# Patient Record
Sex: Female | Born: 1945 | Race: White | Hispanic: No | Smoking: Former smoker
Health system: Southern US, Community
[De-identification: ages and names within clinical notes are randomized; demographics above are authoritative.]

## PROBLEM LIST (undated history)

## (undated) DIAGNOSIS — E119 Type 2 diabetes mellitus without complications: Secondary | ICD-10-CM

## (undated) DIAGNOSIS — I639 Cerebral infarction, unspecified: Secondary | ICD-10-CM

## (undated) DIAGNOSIS — Z8489 Family history of other specified conditions: Secondary | ICD-10-CM

## (undated) DIAGNOSIS — I10 Essential (primary) hypertension: Secondary | ICD-10-CM

## (undated) DIAGNOSIS — I447 Left bundle-branch block, unspecified: Secondary | ICD-10-CM

## (undated) DIAGNOSIS — M5020 Other cervical disc displacement, unspecified cervical region: Secondary | ICD-10-CM

## (undated) DIAGNOSIS — R42 Dizziness and giddiness: Secondary | ICD-10-CM

## (undated) DIAGNOSIS — Z8659 Personal history of other mental and behavioral disorders: Secondary | ICD-10-CM

## (undated) DIAGNOSIS — I6350 Cerebral infarction due to unspecified occlusion or stenosis of unspecified cerebral artery: Secondary | ICD-10-CM

## (undated) DIAGNOSIS — C50919 Malignant neoplasm of unspecified site of unspecified female breast: Secondary | ICD-10-CM

## (undated) HISTORY — DX: Other cervical disc displacement, unspecified cervical region: M50.20

## (undated) HISTORY — DX: Essential (primary) hypertension: I10

## (undated) HISTORY — PX: BACK SURGERY: SHX140

## (undated) HISTORY — PX: TUBAL LIGATION: SHX77

## (undated) HISTORY — DX: Dizziness and giddiness: R42

## (undated) HISTORY — PX: CATARACT EXTRACTION W/ INTRAOCULAR LENS  IMPLANT, BILATERAL: SHX1307

## (undated) HISTORY — DX: Cerebral infarction due to unspecified occlusion or stenosis of unspecified cerebral artery: I63.50

---

## 1999-01-10 ENCOUNTER — Observation Stay (HOSPITAL_COMMUNITY): Admission: RE | Admit: 1999-01-10 | Discharge: 1999-01-11 | Payer: Self-pay | Admitting: *Deleted

## 1999-04-05 ENCOUNTER — Ambulatory Visit (HOSPITAL_BASED_OUTPATIENT_CLINIC_OR_DEPARTMENT_OTHER): Admission: RE | Admit: 1999-04-05 | Discharge: 1999-04-05 | Payer: Self-pay | Admitting: Plastic Surgery

## 1999-11-02 ENCOUNTER — Other Ambulatory Visit: Admission: RE | Admit: 1999-11-02 | Discharge: 1999-11-02 | Payer: Self-pay | Admitting: Obstetrics and Gynecology

## 2000-01-01 ENCOUNTER — Encounter (INDEPENDENT_AMBULATORY_CARE_PROVIDER_SITE_OTHER): Payer: Self-pay

## 2000-01-01 ENCOUNTER — Inpatient Hospital Stay (HOSPITAL_COMMUNITY): Admission: EM | Admit: 2000-01-01 | Discharge: 2000-01-03 | Payer: Self-pay | Admitting: *Deleted

## 2000-12-04 ENCOUNTER — Other Ambulatory Visit: Admission: RE | Admit: 2000-12-04 | Discharge: 2000-12-04 | Payer: Self-pay | Admitting: Obstetrics and Gynecology

## 2001-01-30 ENCOUNTER — Ambulatory Visit (HOSPITAL_COMMUNITY): Admission: RE | Admit: 2001-01-30 | Discharge: 2001-01-30 | Payer: Self-pay | Admitting: Gastroenterology

## 2001-12-17 ENCOUNTER — Other Ambulatory Visit: Admission: RE | Admit: 2001-12-17 | Discharge: 2001-12-17 | Payer: Self-pay | Admitting: Gynecology

## 2004-03-06 ENCOUNTER — Other Ambulatory Visit: Admission: RE | Admit: 2004-03-06 | Discharge: 2004-03-06 | Payer: Self-pay | Admitting: Gynecology

## 2006-08-19 ENCOUNTER — Other Ambulatory Visit: Admission: RE | Admit: 2006-08-19 | Discharge: 2006-08-19 | Payer: Self-pay | Admitting: Gynecology

## 2007-02-24 ENCOUNTER — Inpatient Hospital Stay (HOSPITAL_COMMUNITY): Admission: EM | Admit: 2007-02-24 | Discharge: 2007-02-27 | Payer: Self-pay | Admitting: Emergency Medicine

## 2007-02-25 ENCOUNTER — Ambulatory Visit: Payer: Self-pay | Admitting: Vascular Surgery

## 2007-02-27 ENCOUNTER — Encounter: Payer: Self-pay | Admitting: Internal Medicine

## 2007-04-09 ENCOUNTER — Ambulatory Visit: Payer: Self-pay | Admitting: Vascular Surgery

## 2007-04-16 ENCOUNTER — Encounter: Payer: Self-pay | Admitting: Family Medicine

## 2007-04-21 ENCOUNTER — Ambulatory Visit (HOSPITAL_COMMUNITY): Admission: RE | Admit: 2007-04-21 | Discharge: 2007-04-21 | Payer: Self-pay | Admitting: Interventional Radiology

## 2007-06-02 ENCOUNTER — Inpatient Hospital Stay (HOSPITAL_COMMUNITY): Admission: EM | Admit: 2007-06-02 | Discharge: 2007-06-04 | Payer: Self-pay | Admitting: Emergency Medicine

## 2007-06-02 ENCOUNTER — Ambulatory Visit: Payer: Self-pay | Admitting: Internal Medicine

## 2011-01-23 NOTE — H&P (Signed)
Anna Hunt, Anna Hunt                ACCOUNT NO.:  [id number]   MEDICAL RECORD NO.:  [id number]          PATIENT TYPE:  INP   LOCATION:  5733                         FACILITY:  [facility]   PHYSICIAN:  Therisa Doyne, MD    DATE OF BIRTH:  1946-08-06   DATE OF ADMISSION:  06/01/2007  DATE OF DISCHARGE:                              HISTORY & PHYSICAL   PRIMARY CARE PHYSICIAN:  Chales Salmon. Abigail Miyamoto, M.D., [facility]   CHIEF COMPLAINT:  Right sided chest pain.   HISTORY OF PRESENT ILLNESS:  A 66 year old white female with past  medical history significant for hypertension, diabetes who presents with  3-4 days of malaise and fevers.  She complains of right sided pleuritic  chest pain associated with cough.  She denies shortness of breath,  abdominal pain, nausea, vomiting, dysuria or lower extremity edema.   PAST MEDICAL HISTORY:  1. CVA in 2008.  2. IBD.  3. Hypertension.  4. Hyperlipidemia.  5. Obesity.   SOCIAL HISTORY:  The patient denies tobacco, alcohol or drugs.  She does  have a remote smoking history.   FAMILY HISTORY:  Positive for myocardial infarction.   MEDICATIONS:  1. Avapro 150 mg daily.  2. Glipizide XL 5 mg daily.  3. Byetta 10 units daily.  4. Aspirin 325 mg daily.  5. Zocor 40 mg daily.  6. Paxil 20 mg daily.  7. Xanax 0.5 mg q.h.s.  8. Plavix 75 mg daily.   ALLERGIES:  NIASPAN CAUSES ANAPHYLAXIS.   REVIEW OF SYSTEMS:  All other systems are reviewed and are negative  except as mentioned above in history of present illness.   PHYSICAL EXAMINATION:  VITAL SIGNS:  Temperature 103.9, blood pressure  148/58, pulse 106, respirations 26, oxygen saturation 96% on room air.  GENERAL:  No acute distress.  Alert and oriented x3.  HEENT:  Normocephalic, atraumatic.  Oropharynx pink and moist without  any lesions.  NECK:  Supple.  No JVD.  CARDIOVASCULAR:  Regular rate and rhythm.  No murmurs, rubs or gallops.  CHEST:  Clear to auscultation  bilaterally with crackles at the right  upper lobe.  ABDOMEN:  Positive bowel sounds, soft, nontender, nondistended.  EXTREMITIES:  No cyanosis, clubbing or edema.  NEUROLOGICAL:  Alert and oriented x3.  Cranial nerves II-XII are grossly  intact with no focal deficit.  SKIN:  No rashes.  BACK:  No CVA tenderness.   LABORATORY DATA:  White blood cell 14.8 with a left shift of 88%  segmented neutrophils, 11.9 hemoglobin, platelets 250.  Sodium 133,  potassium 3.8, chloride 99, bicarbonate 25, BUN 10, creatinine 0.9,  glucose 212, total bilirubin 1.6, lipase 20.  Chest x-ray revealed a  right upper lobe pneumonia.  Urinalysis 21-50 white blood cells with  positive nitrites.   ASSESSMENT/PLAN:  A 65 year old white female with past medical history  of diabetes and hypertension who presents with a right upper lobe  pneumonia.   1. For right upper lobe pneumonia, we will treat the patient      empirically with Rocephin and Azithromycin for community acquired  pneumonia.  It should be noted that she was hospitalized      approximately 6 weeks ago, and should she not improve on Rocephin      and Zithromax, then it may be reasonable to broaden her antibiotics      to cover her for possible healthcare associated pneumonia.  Will      check sputum culture as well as urinalysis as she had leukocytes on      her urinalysis.   1. Mild hyponatremia, likely from volume depletion, but could also be      secondary to SIADH from pneumonia, and we will give her normal      saline at 75 cc per hour.   1. Diabetes mellitus.  Hold home medications at this time.  We will      cover her with sliding scale insulin.   1. Hypertension.  Continue Avapro.   1. History of stroke.  Continue aspirin, Plavix and Zocor.   1. Depression.  Continue Paxil.   1. Anxiety.  Continue Xanax.   1. Fluid/electrolytes/nutrition, normal saline at 75 cc per hour,      regular diet, electrolytes are stable.   1.  Prophylaxis for DVT prophylaxis.  Lovenox.      Therisa Doyne, MD  Electronically Signed     SJT/MEDQ  D:  06/02/2007  T:  06/02/2007  Job:  [id number]

## 2011-01-23 NOTE — Consult Note (Signed)
Anna Hunt, Anna Hunt                ACCOUNT NO.:  [id number]   MEDICAL RECORD NO.:  [id number]          PATIENT TYPE:  INP   LOCATION:  1402                         FACILITY:  [facility]   PHYSICIAN:  Casimiro Needle L. Reynolds, M.D.DATE OF BIRTH:  12-23-1945   DATE OF CONSULTATION:  02/26/2007  DATE OF DISCHARGE:                                 CONSULTATION   REQUESTING PHYSICIAN:  Corinna L. Lendell Caprice, MD   REASON FOR EVALUATION:  Stroke.   HISTORY OF PRESENT ILLNESS:  This the initial inpatient consultation  evaluation of this 65 year old woman with a past medical history, which  includes a previous left PCA territory stroke with resultant partial  right visual field loss, for which she says she had seen Dr. Sandria Manly in the  past, for which no old records are available.  The patient reported  onset two evenings ago.  Her for of onset on the evening of June 14, now  June 15, a tingling and numb sensation in the right hand.  She went to  bed, woke up the next morning and notes that the sensation was still  there.  She also noticed that her leg felt a little bit funny, and she  felt a little strange walking around.  She called her primary care  physician who advised her to go to the hospital.  She had a CT of the  head which did not show acute abnormality, but then had an MRI of the  brain which demonstrated an acute left small-vessel pontine infarct.  She was subsequently admitted for further considerations.  She states  that her symptoms have been stable, since she has been here. She  underwent a stroke workup  and the results of that are mostly available  to me at this time.  Neurologic consultation is requested for further  considerations.   PAST MEDICAL HISTORY:  She had a stroke approximately 9 years ago.  She  states that it effected peripheral vision in both eyes, off to her right  side.  She states that because of that, she was placed on Coumadin, and  she has been anticoagulated ever  since.  She denies any history of  atrial fibrillation, valvular heart disease, or any known blood  abnormality.  She has a known history of hypertension, hyperlipidemia,  and diabetes.  She states that these are all under pretty good control.  She has a remote smoking history but does not smoke since her last  stroke.   FAMILY/SOCIAL/REVIEW OF SYSTEMS:  As outlined in admission H&P by Dr.  Rito Ehrlich on February 24, 2007, which is reviewed.   MEDICATIONS:  Prior to admission she was taking Avapro, glipizide,  Byetta, Coumadin, Zocor, Paxil 20 mg daily and Xanax p.r.n.  In the  hospital. she is continued on Xanax q.h.s., Byetta, Glucotrol, sliding  scale insulin, Avapro, Paxil, Zocor and Coumadin.   PHYSICAL EXAMINATION:  VITAL SIGNS:  Temperature 97.4, blood pressure  137/71, pulse 79, respirations 18, O2 sat 96% on room air.  GENERAL:  This is an obese but otherwise healthy-appearing woman, supine  in the hospital bed,  no evident distress.  HEENT:  Head:  Cranium is normocephalic and atraumatic.  Oropharynx  benign.  NECK:  Supple without carotid or supraclavicular bruits.  HEART:  Regular rhythm without murmurs.  NEUROLOGIC:  Mental status.  She is awake and alert.  She is fully  oriented to time, place and person.  Recent and remote memory are  intact.  Attention span, concentration and fund of knowledge are all  appropriate.  Speech is fluent and not dysarthric.  Mood is euthymic and  affect appropriate.  CRANIAL NERVES:  Pupils equal, reactive.  Extraocular movements are full  without nystagmus.  Visual fields full to confrontation, although she  does have a little bit of loss on the far periphery on the right, and  these fields are mostly full to confrontation on the right. Facial  sensation is intact to light touch.  Face, tongue and palate move  normally and symmetrically.  Shoulder shrug strength is normal.  Motor  testing:  Normal bulk and tone.  Normal strength in all tests  of  extremity muscles.  Sensation:  Diminished light touch over the right  hand, otherwise intact throughout.  Coordination:  Rapid movements are  performed well.  Finger-to-nose are performed well.  Gait:  She rises  easily from her chair, and stance is normal.  She is able to ambulate  without much difficulty, has a little bit of difficulty standing with a  narrow base.  Reflexes:  Slightly brisk on the right.  Toes are  downgoing bilaterally.   LABORATORY REVIEW:  CBC on admission:  Normal.  BMETs have been normal.  INRs have been therapeutic in the 2.3-2.4 range.  Lipid profile:  Total  cholesterol 113, triglycerides 201, HDL 24, LDL 49, hemoglobin A1c is  6.9, homocysteine levels normal.  MRI of the brain with MRA:  The  intracranial circulation is personally reviewed.  The MRA is of fairly  poor quality, especially in the posterior circulation.  MRI demonstrates  an acute stroke in the left pontine white matter, in the territory of a  penetrating vessel, with otherwise mild-to-moderate white matter  ischemic changes and encephalomalacia involving an area of the left  occipital lobe.   IMPRESSION:  1. 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  2. History of left PCA territory stroke with incomplete right visual      field loss.  3. Anticoagulation following #2 above.  Indications for this is      uncertain.  4. Multiple vascular risk factors including hypertension,      hyperlipidemia, diabetes and obesity, most of which are in fairly      good control.   RECOMMENDATIONS:  1. For now, would add aspirin 81 mg daily to Coumadin.  However, I      think it will be important to investigate the reason why Coumadin      was started.  It is possible at this drug is not strongly indicated      and could be replaced, perhaps with Aggrenox.  Dr. Abigail Miyamoto and      perhaps Dr. Sandria Manly may have old records, and we will investigate      this.  Would  check an MRA of the neck to further evaluate the      posterior circulation and rule out any proximal vertebral stenosis.      Will continue medications for risk factors.  Given her obesity, it  might be a good idea to check an outpatient polysomnogram to rule      out sleep apnea, which is an independent risk factor for stroke.      She should be prepared for discharge soon.      Michael L. Thad Ranger, M.D.  Electronically Signed     MLR/MEDQ  D:  02/26/2007  T:  02/26/2007  Job:  [id number]   cc:   Chales Salmon. Abigail Miyamoto, M.D.  Fax: [phone]

## 2011-01-23 NOTE — Consult Note (Signed)
NEW PATIENT CONSULTATION   Anna Hunt, Anna Hunt  DOB:  May 11, 1946                                       04/09/2007  ZOXWR#:[id number]   I saw Ms. Sponsel in the office today in consultation concerning  bilateral vertebral artery stenoses.  This is a pleasant 65 year old  right-handed woman who was admitted on February 24, 2007, with an acute  pontine infarct.  She also was noted on her MRI to have an old left  occipital lobe infarct.  The workup during that admission included a  carotid duplex scan which was unremarkable and a 2-Hunt echo which was also  unremarkable.  Just prior to her discharge Dr. Thad Ranger had recommended  an MRA to evaluate the posterior circulation and sure enough she had  evidence of bilateral vertebral disease.  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  She was sent for vascular  consultation.   Of note, she states that her symptoms when she presented with this  stroke in June mostly involved some tingling in her right hand and  heaviness in her right arm in addition to some weakness in the right  leg.  She has had only occasional problems with dizziness but this has  not been a significant problem.  She also upon further questioning  admits to some mild unsteady gait associated with her presenting  symptoms in June.  However this has not been a persistent problem.   She tells me that she did have a previous stroke approximately 9-10  years ago that was associated with loss of peripheral vision and she  tells me she was put on Coumadin at that time.   PAST MEDICAL HISTORY:  Her past medical history is significant for:  1. Non-insulin-dependent diabetes.  2. Hypertension.  3. Hypercholesterolemia.  4. Obesity.  5. She denies any previous history of myocardial infarction, history      of congestive heart failure or history of COPD.   FAMILY HISTORY:   Her mother died with a heart attack at age 83.  She had  a sister who died with a heart attack at age 41.  She thus has a strong  family history of cardiovascular disease.  Her father died in a motor  vehicle accident when she was 65 years old.   SOCIAL HISTORY:  She is single.  She has 2 children.  She quit tobacco  10 years ago.   REVIEW OF SYSTEMS:  Documented on the medical history form in her chart.   MEDICATIONS:  Documented on the medical history form in her chart.  Of  note she is still on Coumadin.   PHYSICAL EXAMINATION:  Vital Signs:  Blood pressure is 122/80 on the  left and 130/82 on the right.  Heart rate is 95.  I do not detect any  coronary disease or supraclavicular bruits.  Lungs:  Clear bilaterally  to auscultation.  Cardiac:  On exam she has a regular rate and rhythm.  Abdomen:  Obese and difficult to assess.  I cannot palpate an aneurysm.  She does have palpable femoral pulses.  Both feet are warm and well-  perfused without ischemic ulcers.  Neurology:  Exam is nonfocal.  She  has good motor and sensory function in both upper  and lower extremities.   I did review her MRA and she does appear to have a proximal right  vertebral artery stenosis.  She has also potentially a moderate proximal  left vertebral artery stenosis.  The carotids look fine on her MRA.  I  also reviewed her carotid duplex scan which showed no evidence of  significant internal carotid artery disease.  Of note, on her duplex  both vertebral arteries were patent with normally directed flow.   IMPRESSION:  Certainly as noted by Dr. Thad Ranger her presenting symptoms  could be related to an embolic phenomenon from her proximal vertebral  disease.  It does not appear that she is having symptoms from low flow  given that she has really had no vertebrobasilar symptoms until she  presented with her stroke.  My thought is that she would benefit from  cerebral arteriography by Dr. Corliss Skains and if the  proximal right  vertebral artery stenosis could be addressed with angioplasty and stent  this could be done at the same time.  However I have recommended that  the patient go ahead and follow up with Dr. Thad Ranger as already  scheduled for April 24, 2007, to be sure he is in agreement with this  plan.  In the meantime she will continue on 81 mg of aspirin daily and  also on her Coumadin.  If it is felt that her proximal vertebral disease  could potentially explain her recent stroke and she is not a candidate  for angioplasty, consideration could be given to vertebral-to-carotid  transposition however, this is associated with multiple potential  complications including stroke, nerve and lymphatic injury.  Therefore I  would favor an endovascular approach if at all possible.  I will be  happy to see her back pending evaluation by  Dr. Thad Ranger and if Dr. Thad Ranger is in agreement with plans for cerebral  arteriogram by Dr. Corliss Skains he could arrange that, or else we would be  happy to arrange that.   Di Kindle. Edilia Bo, M.Hunt.  Electronically Signed   CSD/MEDQ  Hunt:  04/09/2007  T:  04/10/2007  Job:  [id number]   cc:   Casimiro Needle L. Thad Ranger, M.Hunt.  Chales Salmon. Abigail Miyamoto, M.Hunt.

## 2011-01-23 NOTE — H&P (Signed)
Anna Hunt, Anna Hunt                ACCOUNT NO.:  [id number]   MEDICAL RECORD NO.:  [id number]          PATIENT TYPE:  EMS   LOCATION:  ED                           [facility]AC[facility]   PHYSICIAN:  Hollice Espy, M.D.DATE OF BIRTH:  Jan 17, 1946   DATE OF ADMISSION:  02/24/2007  DATE OF DISCHARGE:                              HISTORY & PHYSICAL   PRIMARY CARE PHYSICIAN:  Henrine Screws, M.D.   CHIEF COMPLAINT:  Right arm numbness and weakness.   HISTORY OF PRESENT ILLNESS:  The patient is a 65 year old white female  with a past medical history of a previous cerebrovascular accident that  has affected her peripheral vision.  There is also a history of  hypertension, diabetes, and hyperlipidemia.  About 24 hours ago she was  at home when she started having some numbness in her right hand in the  fingertips.  She decided to monitor her symptoms and then by morning her  symptoms still persisted.  She then also started having some problems  with right arm heaviness and weakness.  She became concerned and called  Dr. Recardo Evangelist office who advised the patient to go right away to the  hospital.  The patient went into the emergency room and the CT scan of  the head was unremarkable, however, except for evidence of her old left  occipital lobe stroke with encephalomalacia, however, an MRI/MRA was  done given the persistent of her symptoms which showed evidence of a new  acute left pontine infarct.  With these findings it was felt best that  the patient come in for further evaluation and treatment.   Currently she is doing okay.  She complains of continued right hand  numbness and right arm weakness and she also noted prior to coming in  that she was having some mild left leg weakness which has affected her  ability to ambulate 100%.  This is also persisting.  She denies any  chest pain, palpitations, shortness of breath, headache, vision changes,  dysphagia, wheezing, coughing, abdominal  pain, hematuria, dysuria,  constipation, or diarrhea.  There are no other focal extremity numbness,  weakness, or pain symptoms other than described above.   REVIEW OF SYSTEMS:  The review of systems is otherwise negative.   PAST MEDICAL HISTORY:  The patient's past medical history includes  previous cerebrovascular accident, hypertension, hyperlipidemia,  diabetes mellitus, and depression.   MEDICATIONS:  She is on Avapro 150 p.o. daily, glipizide 5 p.o. daily,  Byetta 10 p.o. daily, Coumadin 5 p.o. daily, Zocor 40 p.o. daily, Paxil  20 p.o. daily, and Xanax 0.5 p.o. q.h.s.   ALLERGIES:  She has an anaphylactic allergic reaction to Niaspan.   SOCIAL HISTORY:  She denies any tobacco, alcohol, or drug use.  She used  to smoke but quit many many years ago.   FAMILY HISTORY:  The family history is noncontributory.   PHYSICAL EXAMINATION:  VITAL SIGNS:  The patient's vital signs on  admission showed a temperature of 99.5, heart rate of 78, blood pressure  of 150/91, respirations of 20, O2 saturation of 98% on  room air.  Since  being here her blood pressure has come down to 134/82.  GENERAL:  The patient is alert and oriented times three, in on apparent  distress.  HEENT:  Normocephalic, atraumatic.  The mucous membranes are moist.  Her  cranial nerves II through XII are intact.  HEART:  Regular rate and rhythm, S1 and S2, soft.  LUNGS:  The lungs are clear to auscultation bilaterally.  ABDOMEN:  The abdomen is soft, nontender, nondistended, positive bowel  sounds.  EXTREMITIES:  No clubbing, cyanosis, or edema.  She is negative for  Babinski.  Sensation appears to be intact in her lower extremities.  I  did not ambulate the patient.  She seems to have, in terms of her grip,  it actually appears to be intact on the right hand side and only  slightly deficient on the left hand side which is the opposite of what  she is describing.  Other than that she appears to also have some   weakness with shoulder abduction and adduction, on the right hand side,  more consistent with her chief complaint  The rest of her neurological  and musculoskeletal examination workup are essentially unremarkable.   LABORATORY WORK:  CT and MRI are as per HPI.  I am waiting for the final  report on the MRI/MRA.  Sodium of 139, potassium of 3.9, chloride of  103, bicarb of 30, BUN of 11, creatinine of 0.8, and glucose of 94.  White count of 9.1, H and H of 13.8 and 40.3, MCV of 87, platelet count  of 318, no shift.  INR is therapeutic at 2.4.   ASSESSMENT AND PLAN:  1. Acute cerebrovascular accident in a patient on Coumadin with      therapeutic levels.  Certainly this is concerning and will go ahead      and check lifestyle modification factors including homocystine      level, fasting lipid profile, 2-D echocardiogram, and carotid      Dopplers, in hopes to find an avenue for the patient to improve, to      decrease her risk of future stroke.  2. Previous cerebrovascular accident, continue Coumadin, INR is      therapeutic.  3. Diabetes mellitus:  Continue glipizide and Byetta, check hemoglobin      A1C.  4. Hypertension:  Continue Avapro.      Hollice Espy, M.D.  Electronically Signed     SKK/MEDQ  D:  02/24/2007  T:  02/24/2007  Job:  [id number]   cc:   Hollice Espy, M.D.   Chales Salmon. Abigail Miyamoto, M.D.  Fax: [phone]

## 2011-01-23 NOTE — Discharge Summary (Signed)
[name], Anna Hunt                ACCOUNT NO.:  [id number]   MEDICAL RECORD NO.:  [id number]          PATIENT TYPE:  INP   LOCATION:  5730                         FACILITY:  [facility]   PHYSICIAN:  Michiel Cowboy, MDDATE OF BIRTH:  [birth year]   DATE OF ADMISSION:  06/01/2007  DATE OF DISCHARGE:                               DISCHARGE SUMMARY   ADDENDUM:  The patient reports long-standing history of tremors, as well  as occasional burning of her feet.  She is diabetic and also has had a  stroke.  The patient wished this to be worked up further.  Will defer to  Dr. Ria Clock for further evaluation.  Of note, her hemoglobin A1c while  in hospital was 6.8.  The patient may need further neurological workup  and followup, which could be done as an outpatient since this is a  fairly stable problem.      Michiel Cowboy, MD  Electronically Signed     AVD/MEDQ  D:  06/03/2007  T:  06/03/2007  Job:  [id number]

## 2011-01-23 NOTE — Discharge Summary (Signed)
Anna Hunt, Anna Hunt                ACCOUNT NO.:  [id number]   MEDICAL RECORD NO.:  [id number]          PATIENT TYPE:  INP   LOCATION:  5730                         FACILITY:  [facility]   PHYSICIAN:  Michiel Cowboy, MDDATE OF BIRTH:  Feb 18, 1946   DATE OF ADMISSION:  06/01/2007  DATE OF DISCHARGE:  06/04/2007                               DISCHARGE SUMMARY   DISCHARGE DIAGNOSES:  1. Community-acquired pneumonia.  2. Urinary tract infection.  3. History of cerebrovascular accident.  4. Irritable bowel syndrome.  5. Hypertension.  6. Hyponatremia.  7. Hyperlipidemia.  8. Obesity.   HOSPITAL COURSE:  The patient was admitted with right-sided chest pain.   The patient is a 65 year old female with a history of hypertension,  diabetes and stroke.  The patient was complaining of right-sided  pleuritic chest pain, and presented to the emergency department, where  she was found to have right lower lobe pneumonia.  The patient also was  noted to be somewhat hyponatremic down to 125.  The patient was admitted  to the hospital, she had a couple of episodes of fever.  Blood cultures,  urine culture, were drawn.  Blood culture showed negative at the time of  discharge.  Her urine culture showed mixed flora.  But UA showed pyuria.   Patient was first hydrated, then once euvolemic was started on fluid  restriction.  At the day prior to discharge, her sodium is 140.  The  patient's white blood cell count came down to 5.3.  The patient  originally was started on Rocephin and Zithromax, but then switched to  Avelox to complete a 10-day course, which would cover UTI as well.  The  patient was slightly hypotensive, but asymptomatic, and her  hydrochlorothiazide was stopped secondary to her hyponatremia, as well  as her Avapro was decreased to 75 mg a day.  The patient is discharged  with instructions to follow up with her primary care doctor, Dr.  Ria Clock, and have her chemistries rechecked as  well as her blood  pressure at the next appointment in 1 week.   DISCHARGE MEDICATIONS:  1. Avapro 75 mg p.o. once daily.  2. Glipizide XL 5 mg p.o. daily.  3. lantus 10 units subcutaneous daily.  4. Aspirin 81 mg p.o. daily.  5. Zocor 40 mg p.o. daily.  6. Paxil 20 mg p.o. daily.  7. Xanax 0.5 mg p.o. at bedtime.  8. Plavix 75 mg per day.  9. Avelox 400 mg p.o. until June 11, 2007.  10.Stop hydrochlorothiazide.   The patient was instructed to return should she need medical attention,  develop severe shortness of breath or weakness, unsteadiness on her  feet.      Michiel Cowboy, MD  Electronically Signed     AVD/MEDQ  D:  06/03/2007  T:  06/03/2007  Job:  [id number]

## 2011-01-23 NOTE — Discharge Summary (Signed)
Anna Hunt, Anna Hunt                ACCOUNT NO.:  [id number]   MEDICAL RECORD NO.:  [id number]          PATIENT TYPE:  OUT   LOCATION:  MRI                          FACILITY:  [facility]   PHYSICIAN:  Barnetta Chapel, MDDATE OF BIRTH:  09-14-1945   DATE OF ADMISSION:  02/24/2007  DATE OF DISCHARGE:  02/27/2007                               DISCHARGE SUMMARY   PRIMARY CARE PHYSICIAN:  Dr. Henrine Screws   DISCHARGE DIAGNOSES:  1. Acute left pontine infarct.  2. Diabetes mellitus.  3. Hypertension, well controlled.  4. Dyslipidemia.  5. Obesity.   DISCHARGE MEDICATIONS:  1. Avapro 150 mg p.o. once daily.  2. Glipizide XL 5 mg p.o. once daily.  3. Subcutaneous Byetta 10 units b.i.d.  4. Coumadin 5 mg p.o. once daily.  5. Zocor 40 mg p.o. once daily.  6. Paxil 20 mg p.o. once daily.  7. Xanax 0.5 mg q.h.s. p.r.n.  8. Aspirin 81 mg po once daily.   CONSULTATIONS DONE:  The patient was seen by the neurologist, Dr.  Kelli Hope.   IMAGING:  Studies done include:  1. MRI of the brain that showed acute nonhemorrhagic left pontine      infarct.  It also showed old small-to-moderate sized left occipital      lobe infarct with encephalomalacia.  Also showed small vessel      disease-type changes.  2. MRA of the head showedfetal type origin of the left posterior      cerebral artery.  3. CAT scan of the brain showed no acute intracranial abnormalities.      It also showed the old left occipital lobe stroke with      encephalomalacia.   BRIEF HISTORY AND HOSPITAL COURSE:  Please refer to the H&P done on February 24, 2007.  The patient is a 65 year old female with past medical history  significant for CVA, hypertension, hyperlipidemia, diabetes mellitus,  and depression.  The patient presented with numbness, heaviness of the  right upper extremity, as well as heaviness of the right lower  extremity.  The patient called up her primary care physician and was  advised to come to the  hospital.  On presentation to the hospital, MRI  of the brain done revealed acute left pontine infarct.   The patient was admitted to the regular medical floor.  Neurology  consult was called.  It was not readily obvious why the patient had been  on Coumadin.  The patient's symptoms improved without any significant  change in her medications.  A carotid ultrasound has not revealed any  significant abnormality.  Echocardiography has not  revealed any  significant abnormality.  MRA of the neck has been  done, but the  results are still pending.  If there is any significant finding, an  addendum will be added to the discharge summary.  A PT/OT consult was  called, and the physical therapist felt there was not any need for  continued PT.  The patient has done well.  Her blood pressure is well  controlled.  The blood sugars are also well controlled.  She will be  discharged home on above medications.   DISCHARGE PLANS:  1. Discharge the patient home today.  2. Follow up with the primary care physician, Dr. Abigail Miyamoto, in a week.  3. Follow up with Dr. Kelli Hope in 2-4 weeks.  4. ADA diet 1800 kilocals.  5. Activity as tolerated.  6. It will be worth finding out why the patient takes Coumadin.   Addendum: MRA of the Neck was reviewed by the neurologist on call, Dr  Sandria Manly. Dr Sandria Manly has not noted any significant abnormality on the MRA of  the neck. Dr Sandria Manly agreed with plans to discharge patient home.      Barnetta Chapel, MD  Electronically Signed     SIO/MEDQ  D:  02/27/2007  T:  02/27/2007  Job:  [id number]   cc:   Chales Salmon. Abigail Miyamoto, M.D.  Michael L. Thad Ranger, M.D.

## 2011-01-26 NOTE — Procedures (Signed)
[place]  Patient:    Anna Hunt, Anna Hunt                         MRN: [id number] Proc. Date: 01/30/01 Attending:  Verlin Grills, M.D. CC:         Chales Salmon. Abigail Miyamoto, M.D.   Procedure Report  PROCEDURE:  Colonoscopy.  REFERRING PHYSICIAN:  Chales Salmon. Abigail Miyamoto, M.D.  PROCEDURE INDICATION:  Ms. Anna Hunt (date of birth 1946-09-04) is a 65 year old female who is due for screening colonoscopy with polypectomy to prevent colon cancer.  She had an episode of hematochezia following a digital rectal examination.  There is no personal or family history of colon cancer.  I discussed with Ms. Rolon the complications associated with colonoscopy and polypectomy, including intestinal bleeding and intestinal perforation.  Ms. Tarpley has signed the operative permit.  ENDOSCOPIST:  Verlin Grills, M.D.  PREMEDICATION:  Fentanyl 100 mcg, Versed 12.5 mg.  ENDOSCOPE:  Olympus pediatric colonoscope.  DESCRIPTION OF PROCEDURE:  After obtaining informed consent, the patient was placed in the left lateral decubitus position.  I administered intravenous fentanyl and intravenous Versed to achieve conscious sedation for the procedure.  The patients blood pressure, oxygen saturation, and cardiac rhythm were monitored throughout the procedure and documented in the medical record.  Anal inspection was normal.  Digital rectal exam was normal.  The Olympus pediatric video colonoscope was introduced into the rectum and easily advanced to the cecum.  Colonic preparation for the exam today was excellent.  Rectum normal.  Sigmoid colon and descending colon normal.  Splenic flexure normal.  Transverse colon normal.  Hepatic flexure normal.  Ascending colon normal.  Cecum and ileocecal valve normal.  ASSESSMENT:  Normal proctocolonoscopy to the cecum.  RECOMMENDATIONS:  Repeat colonoscopy in approximately 10 years. DD:  01/30/01 TD:   01/30/01 Job: [id number]

## 2011-06-21 ENCOUNTER — Ambulatory Visit (HOSPITAL_COMMUNITY)
Admission: RE | Admit: 2011-06-21 | Discharge: 2011-06-21 | Disposition: A | Discharge status: Home / Self Care | Payer: Medicare Other | Source: Ambulatory Visit | Attending: Gastroenterology | Admitting: Gastroenterology

## 2011-06-21 DIAGNOSIS — I10 Essential (primary) hypertension: Secondary | ICD-10-CM | POA: Insufficient documentation

## 2011-06-21 DIAGNOSIS — H543 Unqualified visual loss, both eyes: Secondary | ICD-10-CM | POA: Insufficient documentation

## 2011-06-21 DIAGNOSIS — R7401 Elevation of levels of liver transaminase levels: Secondary | ICD-10-CM | POA: Insufficient documentation

## 2011-06-21 DIAGNOSIS — R160 Hepatomegaly, not elsewhere classified: Secondary | ICD-10-CM | POA: Insufficient documentation

## 2011-06-21 DIAGNOSIS — R7402 Elevation of levels of lactic acid dehydrogenase (LDH): Secondary | ICD-10-CM | POA: Insufficient documentation

## 2011-06-21 DIAGNOSIS — K7689 Other specified diseases of liver: Secondary | ICD-10-CM | POA: Insufficient documentation

## 2011-06-21 DIAGNOSIS — I6990 Unspecified sequelae of unspecified cerebrovascular disease: Secondary | ICD-10-CM | POA: Insufficient documentation

## 2011-06-21 DIAGNOSIS — Z1211 Encounter for screening for malignant neoplasm of colon: Secondary | ICD-10-CM | POA: Insufficient documentation

## 2011-06-21 DIAGNOSIS — E119 Type 2 diabetes mellitus without complications: Secondary | ICD-10-CM | POA: Insufficient documentation

## 2011-06-21 LAB — CBC
HCT: 30.7 — ABNORMAL LOW
HCT: 32.5 — ABNORMAL LOW
HCT: 35 — ABNORMAL LOW
Hemoglobin: 10.5 — ABNORMAL LOW
Hemoglobin: 11.1 — ABNORMAL LOW
Hemoglobin: 11.9 — ABNORMAL LOW
MCHC: 34.1
MCHC: 34.2
MCHC: 34.3
MCV: 87.8
MCV: 88.9
MCV: 89
Platelets: 204
Platelets: 231
Platelets: 250
RBC: 3.5 — ABNORMAL LOW
RBC: 3.65 — ABNORMAL LOW
RBC: 3.93
RDW: 13
RDW: 13.1
RDW: 13.5
WBC: 14.8 — ABNORMAL HIGH
WBC: 4.2
WBC: 5.3

## 2011-06-21 LAB — BASIC METABOLIC PANEL
BUN: 10
BUN: 10
BUN: 7
BUN: 7
BUN: 8
BUN: 9
CO2: 24
CO2: 24
CO2: 25
CO2: 26
CO2: 26
CO2: 27
Calcium: 8.4
Calcium: 8.6
Calcium: 8.6
Calcium: 8.7
Calcium: 8.8
Calcium: 9.1
Chloride: 100
Chloride: 101
Chloride: 105
Chloride: 108
Chloride: 99
Chloride: 99
Creatinine, Ser: 0.75
Creatinine, Ser: 0.78
Creatinine, Ser: 0.84
Creatinine, Ser: 0.86
Creatinine, Ser: 0.89
Creatinine, Ser: 0.94
GFR calc Af Amer: >60
GFR calc Af Amer: >60
GFR calc Af Amer: >60
GFR calc Af Amer: >60
GFR calc Af Amer: >60
GFR calc Af Amer: >60
GFR calc non Af Amer: >60
GFR calc non Af Amer: >60
GFR calc non Af Amer: >60
GFR calc non Af Amer: >60
GFR calc non Af Amer: >60
GFR calc non Af Amer: >60
Glucose, Bld: 135 — ABNORMAL HIGH
Glucose, Bld: 144 — ABNORMAL HIGH
Glucose, Bld: 158 — ABNORMAL HIGH
Glucose, Bld: 212 — ABNORMAL HIGH
Glucose, Bld: 213 — ABNORMAL HIGH
Glucose, Bld: 229 — ABNORMAL HIGH
Potassium: 3.4 — ABNORMAL LOW
Potassium: 3.5
Potassium: 3.6
Potassium: 3.7
Potassium: 3.8
Potassium: 3.8
Sodium: 131 — ABNORMAL LOW
Sodium: 131 — ABNORMAL LOW
Sodium: 133 — ABNORMAL LOW
Sodium: 135
Sodium: 137
Sodium: 140

## 2011-06-21 LAB — URINALYSIS, ROUTINE W REFLEX MICROSCOPIC
Glucose, UA: NEGATIVE
Ketones, ur: 15 — AB
Nitrite: POSITIVE — AB
Protein, ur: 100 — AB
Specific Gravity, Urine: 1.024
Urobilinogen, UA: 1
pH: 5.5

## 2011-06-21 LAB — CULTURE, BLOOD (ROUTINE X 2)
Culture: NO GROWTH
Culture: NO GROWTH

## 2011-06-21 LAB — URINE CULTURE: Colony Count: 75000

## 2011-06-21 LAB — HEPATIC FUNCTION PANEL
ALT: 33
AST: 35
Albumin: 3.5
Alkaline Phosphatase: 72
Bilirubin, Direct: 0.4 — ABNORMAL HIGH
Indirect Bilirubin: 1.2 — ABNORMAL HIGH
Total Bilirubin: 1.6 — ABNORMAL HIGH
Total Protein: 7.1

## 2011-06-21 LAB — URINE MICROSCOPIC-ADD ON

## 2011-06-21 LAB — DIFFERENTIAL
Basophils Absolute: 0
Basophils Relative: 0
Eosinophils Absolute: 0
Eosinophils Relative: 0
Lymphocytes Relative: 6 — ABNORMAL LOW
Lymphs Abs: 0.9
Monocytes Absolute: 0.8 — ABNORMAL HIGH
Monocytes Relative: 5
Neutro Abs: 13.1 — ABNORMAL HIGH
Neutrophils Relative %: 88 — ABNORMAL HIGH

## 2011-06-21 LAB — GLUCOSE, CAPILLARY: Glucose-Capillary: 176 mg/dL — ABNORMAL HIGH (ref 70–99)

## 2011-06-21 LAB — HEMOGLOBIN A1C
Hgb A1c MFr Bld: 6.8 — ABNORMAL HIGH
Mean Plasma Glucose: 165

## 2011-06-21 LAB — LEGIONELLA PNEUMOPHILA ANTIBODIES: Legionella pneumo, IgG Type 1-6: <1:128 {titer}

## 2011-06-21 LAB — LIPASE, BLOOD: Lipase: 20

## 2011-06-25 LAB — PROTIME-INR
INR: 1.1
Prothrombin Time: 13.9

## 2011-06-25 LAB — CBC
HCT: 38.8
Hemoglobin: 13.2
MCHC: 34
MCV: 89.2
Platelets: 319
RBC: 4.35
RDW: 13.3
WBC: 10.5

## 2011-06-25 LAB — BASIC METABOLIC PANEL
BUN: 15
CO2: 26
Calcium: 10.3
Chloride: 102
Creatinine, Ser: 0.84
GFR calc Af Amer: >60
GFR calc non Af Amer: >60
Glucose, Bld: 199 — ABNORMAL HIGH
Potassium: 3.6
Sodium: 138

## 2011-06-25 LAB — APTT: aPTT: 24

## 2011-06-27 LAB — CBC
HCT: 40.3
Hemoglobin: 13.8
MCHC: 34.2
MCV: 87.2
Platelets: 318
RBC: 4.62
RDW: 13.5
WBC: 9.1

## 2011-06-27 LAB — LIPID PANEL
Cholesterol: 113
HDL: 24 — ABNORMAL LOW
LDL Cholesterol: 49
Total CHOL/HDL Ratio: 4.7
Triglycerides: 201 — ABNORMAL HIGH
VLDL: 40

## 2011-06-27 LAB — DIFFERENTIAL
Basophils Absolute: 0.1
Basophils Relative: 1
Eosinophils Absolute: 0.1
Eosinophils Relative: 1
Lymphocytes Relative: 22
Lymphs Abs: 2
Monocytes Absolute: 0.6
Monocytes Relative: 6
Neutro Abs: 6.4
Neutrophils Relative %: 70

## 2011-06-27 LAB — BASIC METABOLIC PANEL
BUN: 10
BUN: 11
CO2: 26
CO2: 30
Calcium: 10
Calcium: 9.9
Chloride: 103
Chloride: 104
Creatinine, Ser: 0.78
Creatinine, Ser: 0.79
GFR calc Af Amer: >60
GFR calc Af Amer: >60
GFR calc non Af Amer: >60
GFR calc non Af Amer: >60
Glucose, Bld: 155 — ABNORMAL HIGH
Glucose, Bld: 94
Potassium: 3.4 — ABNORMAL LOW
Potassium: 3.9
Sodium: 139
Sodium: 139

## 2011-06-27 LAB — PROTIME-INR
INR: 2.3 — ABNORMAL HIGH
INR: 2.4 — ABNORMAL HIGH
INR: 2.4 — ABNORMAL HIGH
INR: 2.6 — ABNORMAL HIGH
Prothrombin Time: 26.4 — ABNORMAL HIGH
Prothrombin Time: 27.2 — ABNORMAL HIGH
Prothrombin Time: 27.4 — ABNORMAL HIGH
Prothrombin Time: 28.9 — ABNORMAL HIGH

## 2011-06-27 LAB — APTT: aPTT: 36

## 2011-06-27 LAB — HEMOGLOBIN A1C: Hgb A1c MFr Bld: 6.9 — ABNORMAL HIGH

## 2011-06-27 LAB — HOMOCYSTEINE: Homocysteine: 9.6

## 2011-06-27 LAB — SEDIMENTATION RATE: Sed Rate: 12

## 2011-07-02 NOTE — Op Note (Signed)
  NAMEDELPHA, PERKO NO.:  [id number]  MEDICAL RECORD NO.:  [id number]  LOCATION:  WLEN                         FACILITY:  [facility]  PHYSICIAN:  Danise Edge, M.D.   DATE OF BIRTH:  24-Jul-1946  DATE OF PROCEDURE:  06/21/2011 DATE OF DISCHARGE:                              OPERATIVE REPORT   REFERRING PHYSICIAN:  Hessie Diener (Valla Leaver) Tenny Craw, M.D.  HISTORY:  Ms. Anna Hunt is a 65 year old female born on 11-01-1945. The patient is scheduled to undergo her second screening colonoscopy with polypectomy to prevent colon cancer.  In May 2002, the patient underwent a normal screening colonoscopy.  The patient recently underwent her health maintenance gynecologic exam. Abdominal palpation suggested hepatomegaly with the liver edge palpated at 4 cm below the costal margin.  The patient does not consume alcohol. She does take simvastatin and has had a mildly elevated AST since 2010. She also has type 2 diabetes mellitus.  The patient underwent an abdominal ultrasound, which was compatible with hepatic steatosis.  She has not been screened for chronic viral hepatitis, hemochromatosis, autoimmune liver disease, sclerosing cholangitis, alpha 1 antitrypsin deficiency, Wilson disease, and celiac disease.  ENDOSCOPIST:  Danise Edge, M.D.  PREMEDICATION:  Propofol administered by anesthesia.  PROCEDURE IN DETAIL:  Anal inspection and digital rectal exam were normal.  The Pentax pediatric colonoscope was introduced into the rectum and easily advanced to the cecum.  A normal-appearing ileocecal valve and appendiceal orifice were identified.  Colonic preparation for the exam today was good.  Rectum normal.  Retroflex view of the distal rectum normal.  Sigmoid colon and descending colon normal.  Splenic flexure normal.  Transverse colon normal.  Hepatic flexure normal.  Ascending colon normal.  Cecum and ileocecal valve normal.  ASSESSMENT: 1. Normal screening  proctocolonoscopy to the cecum.  Repeat screening     colonoscopy in 10 years. 2. Enlarged liver with mildly elevated liver transaminases and hepatic     steatosis by abdominal ultrasound.  The patient should be screened     for chronic viral hepatitis B and C, hemochromatosis, autoimmune     liver disease, sclerosing cholangitis, alpha 1 antitrypsin     deficiency, Wilson disease, and celiac disease.          ______________________________ Danise Edge, M.D.     MJ/MEDQ  D:  06/21/2011  T:  06/21/2011  Job:  [id number]  cc:   Magnus Sinning) Tenny Craw, M.D. Fax: [phone]  Electronically Signed by Danise Edge M.D. on 07/02/2011 02:05:25 PM

## 2011-11-08 DIAGNOSIS — R7401 Elevation of levels of liver transaminase levels: Secondary | ICD-10-CM | POA: Diagnosis not present

## 2011-11-08 DIAGNOSIS — E119 Type 2 diabetes mellitus without complications: Secondary | ICD-10-CM | POA: Diagnosis not present

## 2011-12-11 DIAGNOSIS — E119 Type 2 diabetes mellitus without complications: Secondary | ICD-10-CM | POA: Diagnosis not present

## 2011-12-11 DIAGNOSIS — H26499 Other secondary cataract, unspecified eye: Secondary | ICD-10-CM | POA: Diagnosis not present

## 2011-12-11 DIAGNOSIS — E11319 Type 2 diabetes mellitus with unspecified diabetic retinopathy without macular edema: Secondary | ICD-10-CM | POA: Diagnosis not present

## 2011-12-11 DIAGNOSIS — H35379 Puckering of macula, unspecified eye: Secondary | ICD-10-CM | POA: Diagnosis not present

## 2012-01-04 DIAGNOSIS — M5020 Other cervical disc displacement, unspecified cervical region: Secondary | ICD-10-CM | POA: Diagnosis not present

## 2012-01-04 DIAGNOSIS — R42 Dizziness and giddiness: Secondary | ICD-10-CM | POA: Diagnosis not present

## 2012-01-04 DIAGNOSIS — I6350 Cerebral infarction due to unspecified occlusion or stenosis of unspecified cerebral artery: Secondary | ICD-10-CM | POA: Diagnosis not present

## 2012-01-16 DIAGNOSIS — I6350 Cerebral infarction due to unspecified occlusion or stenosis of unspecified cerebral artery: Secondary | ICD-10-CM | POA: Diagnosis not present

## 2012-01-16 DIAGNOSIS — R42 Dizziness and giddiness: Secondary | ICD-10-CM | POA: Diagnosis not present

## 2012-02-05 DIAGNOSIS — M5020 Other cervical disc displacement, unspecified cervical region: Secondary | ICD-10-CM | POA: Diagnosis not present

## 2012-02-05 DIAGNOSIS — R42 Dizziness and giddiness: Secondary | ICD-10-CM | POA: Diagnosis not present

## 2012-02-05 DIAGNOSIS — I6350 Cerebral infarction due to unspecified occlusion or stenosis of unspecified cerebral artery: Secondary | ICD-10-CM | POA: Diagnosis not present

## 2012-02-18 DIAGNOSIS — G479 Sleep disorder, unspecified: Secondary | ICD-10-CM | POA: Diagnosis not present

## 2012-02-20 DIAGNOSIS — K7689 Other specified diseases of liver: Secondary | ICD-10-CM | POA: Diagnosis not present

## 2012-03-31 DIAGNOSIS — E119 Type 2 diabetes mellitus without complications: Secondary | ICD-10-CM | POA: Diagnosis not present

## 2012-03-31 DIAGNOSIS — M949 Disorder of cartilage, unspecified: Secondary | ICD-10-CM | POA: Diagnosis not present

## 2012-03-31 DIAGNOSIS — E7800 Pure hypercholesterolemia, unspecified: Secondary | ICD-10-CM | POA: Diagnosis not present

## 2012-03-31 DIAGNOSIS — M899 Disorder of bone, unspecified: Secondary | ICD-10-CM | POA: Diagnosis not present

## 2012-03-31 DIAGNOSIS — I10 Essential (primary) hypertension: Secondary | ICD-10-CM | POA: Diagnosis not present

## 2012-05-07 DIAGNOSIS — E119 Type 2 diabetes mellitus without complications: Secondary | ICD-10-CM | POA: Diagnosis not present

## 2012-05-07 DIAGNOSIS — E7800 Pure hypercholesterolemia, unspecified: Secondary | ICD-10-CM | POA: Diagnosis not present

## 2012-05-07 DIAGNOSIS — G479 Sleep disorder, unspecified: Secondary | ICD-10-CM | POA: Diagnosis not present

## 2012-05-07 DIAGNOSIS — Z79899 Other long term (current) drug therapy: Secondary | ICD-10-CM | POA: Diagnosis not present

## 2012-07-01 DIAGNOSIS — Z23 Encounter for immunization: Secondary | ICD-10-CM | POA: Diagnosis not present

## 2012-07-18 DIAGNOSIS — M5020 Other cervical disc displacement, unspecified cervical region: Secondary | ICD-10-CM | POA: Diagnosis not present

## 2012-07-18 DIAGNOSIS — I6350 Cerebral infarction due to unspecified occlusion or stenosis of unspecified cerebral artery: Secondary | ICD-10-CM | POA: Diagnosis not present

## 2012-07-18 DIAGNOSIS — R42 Dizziness and giddiness: Secondary | ICD-10-CM | POA: Diagnosis not present

## 2012-08-12 DIAGNOSIS — E119 Type 2 diabetes mellitus without complications: Secondary | ICD-10-CM | POA: Diagnosis not present

## 2012-11-07 DIAGNOSIS — L570 Actinic keratosis: Secondary | ICD-10-CM | POA: Diagnosis not present

## 2012-11-07 DIAGNOSIS — E119 Type 2 diabetes mellitus without complications: Secondary | ICD-10-CM | POA: Diagnosis not present

## 2012-12-17 DIAGNOSIS — Z961 Presence of intraocular lens: Secondary | ICD-10-CM | POA: Diagnosis not present

## 2012-12-17 DIAGNOSIS — H26499 Other secondary cataract, unspecified eye: Secondary | ICD-10-CM | POA: Diagnosis not present

## 2012-12-17 DIAGNOSIS — H04129 Dry eye syndrome of unspecified lacrimal gland: Secondary | ICD-10-CM | POA: Diagnosis not present

## 2012-12-17 DIAGNOSIS — E119 Type 2 diabetes mellitus without complications: Secondary | ICD-10-CM | POA: Diagnosis not present

## 2013-01-22 ENCOUNTER — Encounter: Payer: Self-pay | Admitting: Neurology

## 2013-01-22 ENCOUNTER — Ambulatory Visit (INDEPENDENT_AMBULATORY_CARE_PROVIDER_SITE_OTHER): Payer: Medicare Other | Admitting: Neurology

## 2013-01-22 VITALS — BP 129/82 | HR 83 | Ht 63.0 in | Wt 186.0 lb

## 2013-01-22 DIAGNOSIS — I639 Cerebral infarction, unspecified: Secondary | ICD-10-CM

## 2013-01-22 DIAGNOSIS — M5020 Other cervical disc displacement, unspecified cervical region: Secondary | ICD-10-CM

## 2013-01-22 DIAGNOSIS — I6350 Cerebral infarction due to unspecified occlusion or stenosis of unspecified cerebral artery: Secondary | ICD-10-CM

## 2013-01-22 NOTE — Progress Notes (Signed)
HPI:  Anna Hunt, 68 year old white female returns today for followup of stroke.   She has risk factors of diabetes and hypertension   She has a history of a stroke that occurred in 1998 at age 89, with resultant right homonymous hemianopsia  and she was placed on Coumadin for reasons that were never clear. She was then admitted to [facility] with paresthesias and mild weakness on the right side and found to have a pontine infarct by MRI. 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 .   Because no embolic stroke was found Coumadin was discontinued and she was to start aspirin and Plavix. When seen in 2009 by Dr. Thad Ranger she was told to just take aspirin for monotherapy. She is continuing to monitor her risk factors. She gets no regular exercise. Diabetes meds have been changed since last seen. Her most recent hgb A1C was 6.5.    She complains of episodes of dizziness, most recent one was 2 weeks ago, woke up in the morning, she felt a swimmy headed, lasting for 12 hours, there was no spinning, no nausea, she also has episode of mild unsteady gait.  MRA head normal. MRI of the brain with chronic intracranial artherosclerotic disease, and no significant change from 02/24/2007.   Last clinical visit was in November 2013, she is overall doing very well, there was no recurrent dizziness, no gait difficulty, she is on aspirin 81 mg a day,   Physical Exam  General: well developed, well  nourished, seated, in no evident distress Head: head  normocephalic and atraumatic.  Oropharynx benign. Neck: supple no carotid bruits Respiratory: clear to auscultation bilaterally Cardiovascular: regular rate rhythm  Neurologic Exam  Mental Status: pleasant, awake, alert, cooperative to history, talking, and casual conversation. Cranial Nerves: CN II-XII pupils were equal round reactive to light.  Fundi  were sharp bilaterally.  Extraocular movements were full.  Visual fields were full on confrontational test.I did not see visual field deficit on confrontational  test,  Facial sensation and strength were normal.  Hearing was intact to finger rubbing bilaterally.  Uvula tongue were midline.  Head turning and shoulder shrugging were normal and symmetric.  Tongue protrusion into the cheeks strength were normal.  Motor: Normal tone, bulk, and strength. Sensory: Normal to light touch, pinprick, proprioception, and vibratory sensation. Coordination: Normal finger-to-nose, heel-to-shin.  There was no dysmetria noticed. Gait and Station: Narrow based and steady, was able to perform tiptoe, heel, and tandem walking without difficulty.  Romberg sign: Negative Reflexes: Deep tendon reflexes: Biceps: 2/2, Brachioradialis: 2/2, Triceps: 2/2, Pateller: 2/2, Achilles: 2/2.  Plantar responses are flexor.   Assessment and Plan: 67 years old female,with vascular risk factor of hypertension, hyperlipidemia,stroke at a young age. She is overall doing very well   1, continue  aspirin .81mg .  2. RTC as needed.

## 2013-02-04 DIAGNOSIS — L570 Actinic keratosis: Secondary | ICD-10-CM | POA: Diagnosis not present

## 2013-02-04 DIAGNOSIS — D2360 Other benign neoplasm of skin of unspecified upper limb, including shoulder: Secondary | ICD-10-CM | POA: Diagnosis not present

## 2013-02-04 DIAGNOSIS — D485 Neoplasm of uncertain behavior of skin: Secondary | ICD-10-CM | POA: Diagnosis not present

## 2013-02-04 DIAGNOSIS — Z85828 Personal history of other malignant neoplasm of skin: Secondary | ICD-10-CM | POA: Diagnosis not present

## 2013-02-04 DIAGNOSIS — L821 Other seborrheic keratosis: Secondary | ICD-10-CM | POA: Diagnosis not present

## 2013-02-17 DIAGNOSIS — G479 Sleep disorder, unspecified: Secondary | ICD-10-CM | POA: Diagnosis not present

## 2013-02-17 DIAGNOSIS — F411 Generalized anxiety disorder: Secondary | ICD-10-CM | POA: Diagnosis not present

## 2013-02-17 DIAGNOSIS — F429 Obsessive-compulsive disorder, unspecified: Secondary | ICD-10-CM | POA: Diagnosis not present

## 2013-04-30 DIAGNOSIS — Z1231 Encounter for screening mammogram for malignant neoplasm of breast: Secondary | ICD-10-CM | POA: Diagnosis not present

## 2013-04-30 DIAGNOSIS — Z780 Asymptomatic menopausal state: Secondary | ICD-10-CM | POA: Diagnosis not present

## 2013-05-04 DIAGNOSIS — N6489 Other specified disorders of breast: Secondary | ICD-10-CM | POA: Diagnosis not present

## 2013-05-08 DIAGNOSIS — F429 Obsessive-compulsive disorder, unspecified: Secondary | ICD-10-CM | POA: Diagnosis not present

## 2013-05-08 DIAGNOSIS — E119 Type 2 diabetes mellitus without complications: Secondary | ICD-10-CM | POA: Diagnosis not present

## 2013-05-08 DIAGNOSIS — Z0000 Encounter for general adult medical examination without abnormal findings: Secondary | ICD-10-CM | POA: Diagnosis not present

## 2013-05-08 DIAGNOSIS — E7800 Pure hypercholesterolemia, unspecified: Secondary | ICD-10-CM | POA: Diagnosis not present

## 2013-05-08 DIAGNOSIS — Z23 Encounter for immunization: Secondary | ICD-10-CM | POA: Diagnosis not present

## 2013-05-08 DIAGNOSIS — I10 Essential (primary) hypertension: Secondary | ICD-10-CM | POA: Diagnosis not present

## 2013-09-21 DIAGNOSIS — Z85828 Personal history of other malignant neoplasm of skin: Secondary | ICD-10-CM | POA: Diagnosis not present

## 2013-09-21 DIAGNOSIS — L723 Sebaceous cyst: Secondary | ICD-10-CM | POA: Diagnosis not present

## 2013-09-21 DIAGNOSIS — L570 Actinic keratosis: Secondary | ICD-10-CM | POA: Diagnosis not present

## 2013-09-21 DIAGNOSIS — L821 Other seborrheic keratosis: Secondary | ICD-10-CM | POA: Diagnosis not present

## 2013-09-21 DIAGNOSIS — C44319 Basal cell carcinoma of skin of other parts of face: Secondary | ICD-10-CM | POA: Diagnosis not present

## 2013-09-21 DIAGNOSIS — D485 Neoplasm of uncertain behavior of skin: Secondary | ICD-10-CM | POA: Diagnosis not present

## 2013-10-15 DIAGNOSIS — Z85828 Personal history of other malignant neoplasm of skin: Secondary | ICD-10-CM | POA: Diagnosis not present

## 2013-10-15 DIAGNOSIS — C44319 Basal cell carcinoma of skin of other parts of face: Secondary | ICD-10-CM | POA: Diagnosis not present

## 2013-11-09 DIAGNOSIS — M949 Disorder of cartilage, unspecified: Secondary | ICD-10-CM | POA: Diagnosis not present

## 2013-11-09 DIAGNOSIS — E119 Type 2 diabetes mellitus without complications: Secondary | ICD-10-CM | POA: Diagnosis not present

## 2013-11-09 DIAGNOSIS — E7800 Pure hypercholesterolemia, unspecified: Secondary | ICD-10-CM | POA: Diagnosis not present

## 2013-11-09 DIAGNOSIS — M899 Disorder of bone, unspecified: Secondary | ICD-10-CM | POA: Diagnosis not present

## 2013-11-09 DIAGNOSIS — I10 Essential (primary) hypertension: Secondary | ICD-10-CM | POA: Diagnosis not present

## 2013-11-09 DIAGNOSIS — F429 Obsessive-compulsive disorder, unspecified: Secondary | ICD-10-CM | POA: Diagnosis not present

## 2013-12-24 DIAGNOSIS — R109 Unspecified abdominal pain: Secondary | ICD-10-CM | POA: Diagnosis not present

## 2013-12-29 DIAGNOSIS — H47649 Disorders of visual cortex in (due to) vascular disorders, unspecified side of brain: Secondary | ICD-10-CM | POA: Diagnosis not present

## 2013-12-29 DIAGNOSIS — E119 Type 2 diabetes mellitus without complications: Secondary | ICD-10-CM | POA: Diagnosis not present

## 2013-12-29 DIAGNOSIS — Z961 Presence of intraocular lens: Secondary | ICD-10-CM | POA: Diagnosis not present

## 2013-12-29 DIAGNOSIS — H26499 Other secondary cataract, unspecified eye: Secondary | ICD-10-CM | POA: Diagnosis not present

## 2014-02-04 DIAGNOSIS — D239 Other benign neoplasm of skin, unspecified: Secondary | ICD-10-CM | POA: Diagnosis not present

## 2014-02-04 DIAGNOSIS — L723 Sebaceous cyst: Secondary | ICD-10-CM | POA: Diagnosis not present

## 2014-02-04 DIAGNOSIS — D2330 Other benign neoplasm of skin of unspecified part of face: Secondary | ICD-10-CM | POA: Diagnosis not present

## 2014-02-04 DIAGNOSIS — C44711 Basal cell carcinoma of skin of unspecified lower limb, including hip: Secondary | ICD-10-CM | POA: Diagnosis not present

## 2014-02-04 DIAGNOSIS — Z85828 Personal history of other malignant neoplasm of skin: Secondary | ICD-10-CM | POA: Diagnosis not present

## 2014-02-04 DIAGNOSIS — D485 Neoplasm of uncertain behavior of skin: Secondary | ICD-10-CM | POA: Diagnosis not present

## 2014-05-05 DIAGNOSIS — Z803 Family history of malignant neoplasm of breast: Secondary | ICD-10-CM | POA: Diagnosis not present

## 2014-05-05 DIAGNOSIS — Z1231 Encounter for screening mammogram for malignant neoplasm of breast: Secondary | ICD-10-CM | POA: Diagnosis not present

## 2014-05-13 DIAGNOSIS — E119 Type 2 diabetes mellitus without complications: Secondary | ICD-10-CM | POA: Diagnosis not present

## 2014-05-13 DIAGNOSIS — Z23 Encounter for immunization: Secondary | ICD-10-CM | POA: Diagnosis not present

## 2014-05-13 DIAGNOSIS — F429 Obsessive-compulsive disorder, unspecified: Secondary | ICD-10-CM | POA: Diagnosis not present

## 2014-08-16 DIAGNOSIS — L821 Other seborrheic keratosis: Secondary | ICD-10-CM | POA: Diagnosis not present

## 2014-08-16 DIAGNOSIS — D2261 Melanocytic nevi of right upper limb, including shoulder: Secondary | ICD-10-CM | POA: Diagnosis not present

## 2014-08-16 DIAGNOSIS — C44519 Basal cell carcinoma of skin of other part of trunk: Secondary | ICD-10-CM | POA: Diagnosis not present

## 2014-08-16 DIAGNOSIS — D485 Neoplasm of uncertain behavior of skin: Secondary | ICD-10-CM | POA: Diagnosis not present

## 2014-08-16 DIAGNOSIS — Z85828 Personal history of other malignant neoplasm of skin: Secondary | ICD-10-CM | POA: Diagnosis not present

## 2014-11-11 DIAGNOSIS — Z0001 Encounter for general adult medical examination with abnormal findings: Secondary | ICD-10-CM | POA: Diagnosis not present

## 2014-11-11 DIAGNOSIS — I10 Essential (primary) hypertension: Secondary | ICD-10-CM | POA: Diagnosis not present

## 2014-11-11 DIAGNOSIS — I739 Peripheral vascular disease, unspecified: Secondary | ICD-10-CM | POA: Diagnosis not present

## 2014-11-11 DIAGNOSIS — I6509 Occlusion and stenosis of unspecified vertebral artery: Secondary | ICD-10-CM | POA: Diagnosis not present

## 2014-11-11 DIAGNOSIS — Z23 Encounter for immunization: Secondary | ICD-10-CM | POA: Diagnosis not present

## 2014-11-11 DIAGNOSIS — F42 Obsessive-compulsive disorder: Secondary | ICD-10-CM | POA: Diagnosis not present

## 2014-11-11 DIAGNOSIS — E780 Pure hypercholesterolemia: Secondary | ICD-10-CM | POA: Diagnosis not present

## 2014-11-11 DIAGNOSIS — E119 Type 2 diabetes mellitus without complications: Secondary | ICD-10-CM | POA: Diagnosis not present

## 2014-11-11 DIAGNOSIS — Z8673 Personal history of transient ischemic attack (TIA), and cerebral infarction without residual deficits: Secondary | ICD-10-CM | POA: Diagnosis not present

## 2014-11-11 DIAGNOSIS — M8580 Other specified disorders of bone density and structure, unspecified site: Secondary | ICD-10-CM | POA: Diagnosis not present

## 2014-11-12 ENCOUNTER — Other Ambulatory Visit: Payer: Self-pay | Admitting: Family Medicine

## 2014-11-12 DIAGNOSIS — Z8673 Personal history of transient ischemic attack (TIA), and cerebral infarction without residual deficits: Secondary | ICD-10-CM

## 2014-11-18 ENCOUNTER — Ambulatory Visit
Admission: RE | Admit: 2014-11-18 | Discharge: 2014-11-18 | Disposition: A | Discharge status: Home / Self Care | Payer: Medicare Other | Source: Ambulatory Visit | Attending: Family Medicine | Admitting: Family Medicine

## 2014-11-18 DIAGNOSIS — I6523 Occlusion and stenosis of bilateral carotid arteries: Secondary | ICD-10-CM | POA: Diagnosis not present

## 2014-11-18 DIAGNOSIS — Z8673 Personal history of transient ischemic attack (TIA), and cerebral infarction without residual deficits: Secondary | ICD-10-CM

## 2015-02-08 DIAGNOSIS — Z961 Presence of intraocular lens: Secondary | ICD-10-CM | POA: Diagnosis not present

## 2015-02-08 DIAGNOSIS — E119 Type 2 diabetes mellitus without complications: Secondary | ICD-10-CM | POA: Diagnosis not present

## 2015-02-08 DIAGNOSIS — H26491 Other secondary cataract, right eye: Secondary | ICD-10-CM | POA: Diagnosis not present

## 2015-05-17 DIAGNOSIS — Z23 Encounter for immunization: Secondary | ICD-10-CM | POA: Diagnosis not present

## 2015-05-17 DIAGNOSIS — F42 Obsessive-compulsive disorder: Secondary | ICD-10-CM | POA: Diagnosis not present

## 2015-05-17 DIAGNOSIS — E119 Type 2 diabetes mellitus without complications: Secondary | ICD-10-CM | POA: Diagnosis not present

## 2015-05-17 DIAGNOSIS — I10 Essential (primary) hypertension: Secondary | ICD-10-CM | POA: Diagnosis not present

## 2015-05-17 DIAGNOSIS — R748 Abnormal levels of other serum enzymes: Secondary | ICD-10-CM | POA: Diagnosis not present

## 2015-06-27 DIAGNOSIS — M79629 Pain in unspecified upper arm: Secondary | ICD-10-CM | POA: Diagnosis not present

## 2015-11-16 DIAGNOSIS — Z7984 Long term (current) use of oral hypoglycemic drugs: Secondary | ICD-10-CM | POA: Diagnosis not present

## 2015-11-16 DIAGNOSIS — E782 Mixed hyperlipidemia: Secondary | ICD-10-CM | POA: Diagnosis not present

## 2015-11-16 DIAGNOSIS — F429 Obsessive-compulsive disorder, unspecified: Secondary | ICD-10-CM | POA: Diagnosis not present

## 2015-11-16 DIAGNOSIS — Z0000 Encounter for general adult medical examination without abnormal findings: Secondary | ICD-10-CM | POA: Diagnosis not present

## 2015-11-16 DIAGNOSIS — E119 Type 2 diabetes mellitus without complications: Secondary | ICD-10-CM | POA: Diagnosis not present

## 2015-11-16 DIAGNOSIS — I10 Essential (primary) hypertension: Secondary | ICD-10-CM | POA: Diagnosis not present

## 2015-11-16 DIAGNOSIS — I739 Peripheral vascular disease, unspecified: Secondary | ICD-10-CM | POA: Diagnosis not present

## 2016-03-29 DIAGNOSIS — E119 Type 2 diabetes mellitus without complications: Secondary | ICD-10-CM | POA: Diagnosis not present

## 2016-03-29 DIAGNOSIS — I63512 Cerebral infarction due to unspecified occlusion or stenosis of left middle cerebral artery: Secondary | ICD-10-CM | POA: Diagnosis not present

## 2016-03-29 DIAGNOSIS — H26493 Other secondary cataract, bilateral: Secondary | ICD-10-CM | POA: Diagnosis not present

## 2016-03-29 DIAGNOSIS — Z961 Presence of intraocular lens: Secondary | ICD-10-CM | POA: Diagnosis not present

## 2016-05-15 DIAGNOSIS — Z1231 Encounter for screening mammogram for malignant neoplasm of breast: Secondary | ICD-10-CM | POA: Diagnosis not present

## 2016-05-18 DIAGNOSIS — F429 Obsessive-compulsive disorder, unspecified: Secondary | ICD-10-CM | POA: Diagnosis not present

## 2016-05-18 DIAGNOSIS — E119 Type 2 diabetes mellitus without complications: Secondary | ICD-10-CM | POA: Diagnosis not present

## 2016-05-18 DIAGNOSIS — Z7984 Long term (current) use of oral hypoglycemic drugs: Secondary | ICD-10-CM | POA: Diagnosis not present

## 2016-05-18 DIAGNOSIS — Z23 Encounter for immunization: Secondary | ICD-10-CM | POA: Diagnosis not present

## 2016-05-18 DIAGNOSIS — G479 Sleep disorder, unspecified: Secondary | ICD-10-CM | POA: Diagnosis not present

## 2016-07-05 DIAGNOSIS — Z1289 Encounter for screening for malignant neoplasm of other sites: Secondary | ICD-10-CM | POA: Diagnosis not present

## 2016-10-19 DIAGNOSIS — I6930 Unspecified sequelae of cerebral infarction: Secondary | ICD-10-CM | POA: Diagnosis not present

## 2016-10-19 DIAGNOSIS — F429 Obsessive-compulsive disorder, unspecified: Secondary | ICD-10-CM | POA: Diagnosis not present

## 2016-10-19 DIAGNOSIS — I10 Essential (primary) hypertension: Secondary | ICD-10-CM | POA: Diagnosis not present

## 2016-12-03 DIAGNOSIS — F429 Obsessive-compulsive disorder, unspecified: Secondary | ICD-10-CM | POA: Diagnosis not present

## 2016-12-03 DIAGNOSIS — G479 Sleep disorder, unspecified: Secondary | ICD-10-CM | POA: Diagnosis not present

## 2016-12-03 DIAGNOSIS — Z0000 Encounter for general adult medical examination without abnormal findings: Secondary | ICD-10-CM | POA: Diagnosis not present

## 2016-12-03 DIAGNOSIS — I10 Essential (primary) hypertension: Secondary | ICD-10-CM | POA: Diagnosis not present

## 2016-12-03 DIAGNOSIS — Z7984 Long term (current) use of oral hypoglycemic drugs: Secondary | ICD-10-CM | POA: Diagnosis not present

## 2016-12-03 DIAGNOSIS — E782 Mixed hyperlipidemia: Secondary | ICD-10-CM | POA: Diagnosis not present

## 2016-12-03 DIAGNOSIS — I6930 Unspecified sequelae of cerebral infarction: Secondary | ICD-10-CM | POA: Diagnosis not present

## 2016-12-03 DIAGNOSIS — M8580 Other specified disorders of bone density and structure, unspecified site: Secondary | ICD-10-CM | POA: Diagnosis not present

## 2016-12-03 DIAGNOSIS — E119 Type 2 diabetes mellitus without complications: Secondary | ICD-10-CM | POA: Diagnosis not present

## 2017-04-16 DIAGNOSIS — H26491 Other secondary cataract, right eye: Secondary | ICD-10-CM | POA: Diagnosis not present

## 2017-04-16 DIAGNOSIS — Z961 Presence of intraocular lens: Secondary | ICD-10-CM | POA: Diagnosis not present

## 2017-04-16 DIAGNOSIS — E119 Type 2 diabetes mellitus without complications: Secondary | ICD-10-CM | POA: Diagnosis not present

## 2017-04-16 DIAGNOSIS — H04123 Dry eye syndrome of bilateral lacrimal glands: Secondary | ICD-10-CM | POA: Diagnosis not present

## 2017-06-10 DIAGNOSIS — E1165 Type 2 diabetes mellitus with hyperglycemia: Secondary | ICD-10-CM | POA: Diagnosis not present

## 2017-06-10 DIAGNOSIS — Z794 Long term (current) use of insulin: Secondary | ICD-10-CM | POA: Diagnosis not present

## 2017-06-10 DIAGNOSIS — Z23 Encounter for immunization: Secondary | ICD-10-CM | POA: Diagnosis not present

## 2017-12-17 DIAGNOSIS — I739 Peripheral vascular disease, unspecified: Secondary | ICD-10-CM | POA: Diagnosis not present

## 2017-12-17 DIAGNOSIS — I10 Essential (primary) hypertension: Secondary | ICD-10-CM | POA: Diagnosis not present

## 2017-12-17 DIAGNOSIS — G479 Sleep disorder, unspecified: Secondary | ICD-10-CM | POA: Diagnosis not present

## 2017-12-17 DIAGNOSIS — E119 Type 2 diabetes mellitus without complications: Secondary | ICD-10-CM | POA: Diagnosis not present

## 2017-12-17 DIAGNOSIS — F429 Obsessive-compulsive disorder, unspecified: Secondary | ICD-10-CM | POA: Diagnosis not present

## 2017-12-17 DIAGNOSIS — Z0000 Encounter for general adult medical examination without abnormal findings: Secondary | ICD-10-CM | POA: Diagnosis not present

## 2017-12-17 DIAGNOSIS — I6930 Unspecified sequelae of cerebral infarction: Secondary | ICD-10-CM | POA: Diagnosis not present

## 2017-12-17 DIAGNOSIS — E782 Mixed hyperlipidemia: Secondary | ICD-10-CM | POA: Diagnosis not present

## 2017-12-17 DIAGNOSIS — Z794 Long term (current) use of insulin: Secondary | ICD-10-CM | POA: Diagnosis not present

## 2018-04-16 DIAGNOSIS — E119 Type 2 diabetes mellitus without complications: Secondary | ICD-10-CM | POA: Diagnosis not present

## 2018-04-16 DIAGNOSIS — H3581 Retinal edema: Secondary | ICD-10-CM | POA: Diagnosis not present

## 2018-04-16 DIAGNOSIS — Z961 Presence of intraocular lens: Secondary | ICD-10-CM | POA: Diagnosis not present

## 2018-06-19 DIAGNOSIS — I10 Essential (primary) hypertension: Secondary | ICD-10-CM | POA: Diagnosis not present

## 2018-06-19 DIAGNOSIS — E1169 Type 2 diabetes mellitus with other specified complication: Secondary | ICD-10-CM | POA: Diagnosis not present

## 2018-06-19 DIAGNOSIS — F429 Obsessive-compulsive disorder, unspecified: Secondary | ICD-10-CM | POA: Diagnosis not present

## 2018-06-19 DIAGNOSIS — E782 Mixed hyperlipidemia: Secondary | ICD-10-CM | POA: Diagnosis not present

## 2018-06-19 DIAGNOSIS — Z23 Encounter for immunization: Secondary | ICD-10-CM | POA: Diagnosis not present

## 2018-11-16 ENCOUNTER — Observation Stay (HOSPITAL_BASED_OUTPATIENT_CLINIC_OR_DEPARTMENT_OTHER)
Admission: EM | Admit: 2018-11-16 | Discharge: 2018-11-17 | Disposition: A | Discharge status: Home / Self Care | Payer: Medicare Other | Attending: Internal Medicine | Admitting: Internal Medicine

## 2018-11-16 ENCOUNTER — Emergency Department (HOSPITAL_BASED_OUTPATIENT_CLINIC_OR_DEPARTMENT_OTHER): Payer: Medicare Other

## 2018-11-16 ENCOUNTER — Encounter (HOSPITAL_BASED_OUTPATIENT_CLINIC_OR_DEPARTMENT_OTHER): Payer: Self-pay | Admitting: Emergency Medicine

## 2018-11-16 ENCOUNTER — Other Ambulatory Visit: Payer: Self-pay

## 2018-11-16 DIAGNOSIS — E785 Hyperlipidemia, unspecified: Secondary | ICD-10-CM | POA: Diagnosis not present

## 2018-11-16 DIAGNOSIS — M6281 Muscle weakness (generalized): Secondary | ICD-10-CM | POA: Insufficient documentation

## 2018-11-16 DIAGNOSIS — R29810 Facial weakness: Secondary | ICD-10-CM | POA: Diagnosis not present

## 2018-11-16 DIAGNOSIS — E119 Type 2 diabetes mellitus without complications: Secondary | ICD-10-CM | POA: Insufficient documentation

## 2018-11-16 DIAGNOSIS — G459 Transient cerebral ischemic attack, unspecified: Principal | ICD-10-CM | POA: Insufficient documentation

## 2018-11-16 DIAGNOSIS — I447 Left bundle-branch block, unspecified: Secondary | ICD-10-CM | POA: Diagnosis not present

## 2018-11-16 DIAGNOSIS — G9389 Other specified disorders of brain: Secondary | ICD-10-CM | POA: Diagnosis not present

## 2018-11-16 DIAGNOSIS — R41841 Cognitive communication deficit: Secondary | ICD-10-CM | POA: Diagnosis not present

## 2018-11-16 DIAGNOSIS — Z7982 Long term (current) use of aspirin: Secondary | ICD-10-CM | POA: Insufficient documentation

## 2018-11-16 DIAGNOSIS — Z8673 Personal history of transient ischemic attack (TIA), and cerebral infarction without residual deficits: Secondary | ICD-10-CM | POA: Insufficient documentation

## 2018-11-16 DIAGNOSIS — I10 Essential (primary) hypertension: Secondary | ICD-10-CM | POA: Diagnosis present

## 2018-11-16 DIAGNOSIS — R4701 Aphasia: Secondary | ICD-10-CM | POA: Diagnosis not present

## 2018-11-16 DIAGNOSIS — Z79899 Other long term (current) drug therapy: Secondary | ICD-10-CM | POA: Insufficient documentation

## 2018-11-16 DIAGNOSIS — R2681 Unsteadiness on feet: Secondary | ICD-10-CM | POA: Diagnosis not present

## 2018-11-16 DIAGNOSIS — Z8249 Family history of ischemic heart disease and other diseases of the circulatory system: Secondary | ICD-10-CM | POA: Insufficient documentation

## 2018-11-16 HISTORY — DX: Left bundle-branch block, unspecified: I44.7

## 2018-11-16 HISTORY — DX: Cerebral infarction, unspecified: I63.9

## 2018-11-16 HISTORY — DX: Type 2 diabetes mellitus without complications: E11.9

## 2018-11-16 LAB — RAPID URINE DRUG SCREEN, HOSP PERFORMED
Amphetamines: NOT DETECTED
Barbiturates: NOT DETECTED
Benzodiazepines: POSITIVE — AB
Cocaine: NOT DETECTED
Opiates: NOT DETECTED
Tetrahydrocannabinol: NOT DETECTED

## 2018-11-16 LAB — CBC WITH DIFFERENTIAL/PLATELET
Abs Immature Granulocytes: 0.03 10*3/uL (ref 0.00–0.07)
Basophils Absolute: 0 10*3/uL (ref 0.0–0.1)
Basophils Relative: 0 %
Eosinophils Absolute: 0.1 10*3/uL (ref 0.0–0.5)
Eosinophils Relative: 1 %
HCT: 39.4 % (ref 36.0–46.0)
Hemoglobin: 12.8 g/dL (ref 12.0–15.0)
Immature Granulocytes: 0 %
Lymphocytes Relative: 15 %
Lymphs Abs: 1.4 10*3/uL (ref 0.7–4.0)
MCH: 29 pg (ref 26.0–34.0)
MCHC: 32.5 g/dL (ref 30.0–36.0)
MCV: 89.3 fL (ref 80.0–100.0)
Monocytes Absolute: 0.6 10*3/uL (ref 0.1–1.0)
Monocytes Relative: 6 %
Neutro Abs: 7.7 10*3/uL (ref 1.7–7.7)
Neutrophils Relative %: 78 %
Platelets: 250 10*3/uL (ref 150–400)
RBC: 4.41 MIL/uL (ref 3.87–5.11)
RDW: 12.7 % (ref 11.5–15.5)
WBC: 9.9 10*3/uL (ref 4.0–10.5)
nRBC: 0 % (ref 0.0–0.2)

## 2018-11-16 LAB — PROTIME-INR
INR: 0.9 (ref 0.8–1.2)
Prothrombin Time: 12.3 seconds (ref 11.4–15.2)

## 2018-11-16 LAB — COMPREHENSIVE METABOLIC PANEL
ALT: 21 U/L (ref 0–44)
AST: 28 U/L (ref 15–41)
Albumin: 4.3 g/dL (ref 3.5–5.0)
Alkaline Phosphatase: 77 U/L (ref 38–126)
Anion gap: 9 (ref 5–15)
BUN: 13 mg/dL (ref 8–23)
CO2: 20 mmol/L — ABNORMAL LOW (ref 22–32)
Calcium: 9.3 mg/dL (ref 8.9–10.3)
Chloride: 107 mmol/L (ref 98–111)
Creatinine, Ser: 0.65 mg/dL (ref 0.44–1.00)
GFR calc Af Amer: >60 mL/min (ref >60)
GFR calc non Af Amer: >60 mL/min (ref >60)
Glucose, Bld: 174 mg/dL — ABNORMAL HIGH (ref 70–99)
Potassium: 3.8 mmol/L (ref 3.5–5.1)
Sodium: 136 mmol/L (ref 135–145)
Total Bilirubin: 0.8 mg/dL (ref 0.3–1.2)
Total Protein: 7.4 g/dL (ref 6.5–8.1)

## 2018-11-16 LAB — URINALYSIS, ROUTINE W REFLEX MICROSCOPIC
Bilirubin Urine: NEGATIVE
Glucose, UA: NEGATIVE mg/dL
Hgb urine dipstick: NEGATIVE
Ketones, ur: NEGATIVE mg/dL
Nitrite: NEGATIVE
Protein, ur: NEGATIVE mg/dL
Specific Gravity, Urine: <1.005 — ABNORMAL LOW (ref 1.005–1.030)
pH: 6 (ref 5.0–8.0)

## 2018-11-16 LAB — URINALYSIS, MICROSCOPIC (REFLEX): RBC / HPF: NONE SEEN RBC/hpf (ref 0–5)

## 2018-11-16 LAB — CBG MONITORING, ED: Glucose-Capillary: 157 mg/dL — ABNORMAL HIGH (ref 70–99)

## 2018-11-16 LAB — GLUCOSE, CAPILLARY: Glucose-Capillary: 145 mg/dL — ABNORMAL HIGH (ref 70–99)

## 2018-11-16 LAB — ETHANOL: Alcohol, Ethyl (B): <10 mg/dL (ref <10)

## 2018-11-16 LAB — APTT: aPTT: 24 seconds (ref 24–36)

## 2018-11-16 MED ORDER — ACETAMINOPHEN 325 MG PO TABS: 650.0000 mg | ORAL_TABLET | ORAL | Status: DC | PRN

## 2018-11-16 MED ORDER — EXENATIDE 10 MCG/0.04ML ~~LOC~~ SOPN: 10.0000 ug | PEN_INJECTOR | Freq: Two times a day (BID) | SUBCUTANEOUS | Status: DC

## 2018-11-16 MED ORDER — SIMVASTATIN 20 MG PO TABS: 40.0000 mg | ORAL_TABLET | Freq: Every day | ORAL | Status: DC

## 2018-11-16 MED ORDER — INSULIN ASPART 100 UNIT/ML ~~LOC~~ SOLN
0.0000 [IU] | Freq: Three times a day (TID) | SUBCUTANEOUS | Status: DC
  Administered 2018-11-17: 5 [IU] via SUBCUTANEOUS
  Administered 2018-11-17: 2 [IU] via SUBCUTANEOUS

## 2018-11-16 MED ORDER — STROKE: EARLY STAGES OF RECOVERY BOOK
Freq: Once | Status: AC
  Administered 2018-11-17

## 2018-11-16 MED ORDER — ACETAMINOPHEN 650 MG RE SUPP: 650.0000 mg | RECTAL | Status: DC | PRN

## 2018-11-16 MED ORDER — INSULIN ASPART 100 UNIT/ML ~~LOC~~ SOLN: 0.0000 [IU] | Freq: Every day | SUBCUTANEOUS | Status: DC

## 2018-11-16 MED ORDER — IRBESARTAN 75 MG PO TABS: 75.0000 mg | ORAL_TABLET | Freq: Every day | ORAL | Status: DC

## 2018-11-16 MED ORDER — SENNOSIDES-DOCUSATE SODIUM 8.6-50 MG PO TABS: 1.0000 | ORAL_TABLET | Freq: Every evening | ORAL | Status: DC | PRN

## 2018-11-16 MED ORDER — ALPRAZOLAM 0.5 MG PO TABS
0.5000 mg | ORAL_TABLET | Freq: Every evening | ORAL | Status: DC | PRN
  Administered 2018-11-17: 0.5 mg via ORAL
  Filled 2018-11-16: qty 1

## 2018-11-16 MED ORDER — ASPIRIN EC 81 MG PO TBEC
81.0000 mg | DELAYED_RELEASE_TABLET | Freq: Every day | ORAL | Status: DC
  Administered 2018-11-17: 81 mg via ORAL
  Filled 2018-11-16: qty 1

## 2018-11-16 MED ORDER — CLONIDINE HCL 0.1 MG PO TABS: 0.1000 mg | ORAL_TABLET | ORAL | Status: DC | PRN

## 2018-11-16 MED ORDER — ENOXAPARIN SODIUM 40 MG/0.4ML ~~LOC~~ SOLN
40.0000 mg | SUBCUTANEOUS | Status: DC
  Administered 2018-11-17: 40 mg via SUBCUTANEOUS
  Filled 2018-11-16: qty 0.4

## 2018-11-16 MED ORDER — PAROXETINE HCL 20 MG PO TABS
20.0000 mg | ORAL_TABLET | Freq: Every day | ORAL | Status: DC
  Administered 2018-11-17: 20 mg via ORAL
  Filled 2018-11-16: qty 1

## 2018-11-16 MED ORDER — ACETAMINOPHEN 160 MG/5ML PO SOLN: 650.0000 mg | ORAL | Status: DC | PRN

## 2018-11-16 NOTE — ED Notes (Signed)
[facility] arrived to transport pt to [facility] 

## 2018-11-16 NOTE — Assessment & Plan Note (Addendum)
No recent EKG. Prior documentation of EKG in 2013 was NSR.  May need outpatient stress test since this is "new" LBBB.

## 2018-11-16 NOTE — ED Notes (Signed)
Pt ambulated with steady gait to bathroom to provide specimen.

## 2018-11-16 NOTE — ED Notes (Signed)
ED Provider at bedside. Pt on cardiac monitor and auto VS

## 2018-11-16 NOTE — ED Notes (Signed)
Pt to CT

## 2018-11-16 NOTE — ED Notes (Signed)
[facility] notified (Anna Hunt) - patient ready for transport 

## 2018-11-16 NOTE — Plan of Care (Signed)
Discussed with patient and family plan of care for the evening, admission routine and questions with some teach back displayed

## 2018-11-16 NOTE — ED Provider Notes (Signed)
Emergency Department Provider Note   I have reviewed the triage vital signs and the nursing notes.   HISTORY  Chief Complaint Facial Droop   HPI Anna Hunt is a 73 y.o. female with PMH of DM, CVA, and vertigo resents to the emergency department with strokelike symptoms which occurred last night at dinner.  Patient was apparently out eating dinner with her sister when she had to separate episodes of word finding difficulty and speech change.  The family member who witnessed the event states that she was unable to get her words out and noticed some left facial droop.  This occurred twice while at dinner lasting for 5 to 10 minutes each.  Patient felt frustrated but was able to continue on with dinner.  He did not call EMS or present to the emergency department last night.  Patient denies any numbness, weakness, or headache.  She has had 2 prior strokes but nothing in the last several years.  She previously followed with [facility] neurology.  She is not anticoagulated.  Her symptoms resolved and have not returned.  When her other sister found out about what happened at dinner this morning she insisted that she presented to the emergency department for evaluation.   Past Medical History:  Diagnosis Date  . Diabetes mellitus without complication ([place])   . Displacement of cervical intervertebral disc without myelopathy   . LBBB (left bundle branch block)   . Occipital stroke ([place]) - left   . Unspecified cerebral artery occlusion with cerebral infarction   . Vertigo     Patient Active Problem List   Diagnosis Date Noted  . TIA (transient ischemic attack) 11/16/2018  . HTN (hypertension), benign 11/16/2018  . LBBB (left bundle branch block)   . Diabetes mellitus without complication ([place])   . Stroke ([place]) 01/22/2013  . Displacement of cervical intervertebral disc without myelopathy     Past Surgical History:  Procedure Laterality Date  . BACK SURGERY      Allergies Niaspan  [niacin er]  History reviewed. No pertinent family history.  Social History Social History   Tobacco Use  . Smoking status: Never Smoker  . Smokeless tobacco: Never Used  Substance Use Topics  . Alcohol use: No  . Drug use: No    Review of Systems  Constitutional: No fever/chills Eyes: No visual changes. ENT: No sore throat. Cardiovascular: Denies chest pain. Respiratory: Denies shortness of breath. Gastrointestinal: No abdominal pain.  No nausea, no vomiting.  No diarrhea.  No constipation. Genitourinary: Negative for dysuria. Musculoskeletal: Negative for back pain. Skin: Negative for rash. Neurological: Negative for headaches or numbness. Transient speech change and facial droop.   10-point ROS otherwise negative.  ____________________________________________   PHYSICAL EXAM:  VITAL SIGNS: ED Triage Vitals [11/16/18 1240]  Enc Vitals Group     BP (!) 169/81     Pulse Rate 91     Resp 18     Temp 97.8 F (36.6 C)     Temp Source Oral     SpO2 100 %     Weight 186 lb 1.1 oz (84.4 kg)     Height 5' 2.5" (1.588 m)   Constitutional: Alert and oriented. Well appearing and in no acute distress. Eyes: Conjunctivae are normal.  Head: Atraumatic. Nose: No congestion/rhinnorhea. Mouth/Throat: Mucous membranes are moist.   Neck: No stridor.   Cardiovascular: Normal rate, regular rhythm. Good peripheral circulation. Grossly normal heart sounds.   Respiratory: Normal respiratory effort.  No retractions.  Lungs CTAB. Gastrointestinal: Soft and nontender. No distention.  Musculoskeletal: No lower extremity tenderness nor edema. No gross deformities of extremities. Neurologic:  Normal speech and language. No gross focal neurologic deficits are appreciated. Normal CN exam 2-12. Normal finger-to-nose.  Skin:  Skin is warm, dry and intact. No rash noted.  ____________________________________________   LABS (all labs ordered are listed, but only abnormal results are  displayed)  Labs Reviewed  RAPID URINE DRUG SCREEN, HOSP PERFORMED - Abnormal; Notable for the following components:      Result Value   Benzodiazepines POSITIVE (*)    All other components within normal limits  URINALYSIS, ROUTINE W REFLEX MICROSCOPIC - Abnormal; Notable for the following components:   APPearance HAZY (*)    Specific Gravity, Urine <1.005 (*)    Leukocytes,Ua SMALL (*)    All other components within normal limits  COMPREHENSIVE METABOLIC PANEL - Abnormal; Notable for the following components:   CO2 20 (*)    Glucose, Bld 174 (*)    All other components within normal limits  URINALYSIS, MICROSCOPIC (REFLEX) - Abnormal; Notable for the following components:   Bacteria, UA RARE (*)    All other components within normal limits  CBG MONITORING, ED - Abnormal; Notable for the following components:   Glucose-Capillary 157 (*)    All other components within normal limits  ETHANOL  PROTIME-INR  APTT  CBC WITH DIFFERENTIAL/PLATELET   ____________________________________________  EKG   EKG Interpretation  Date/Time:  Sunday November 16 2018 13:02:35 EDT Ventricular Rate:  92 PR Interval:    QRS Duration: 138 QT Interval:  395 QTC Calculation: 489 R Axis:   43 Text Interpretation:  Sinus rhythm Left bundle branch block New LBBB. No STEMI.  Confirmed by Nanda Quinton [phone]) on 11/16/2018 1:19:25 PM       ____________________________________________  RADIOLOGY  Ct Head Wo Contrast  Result Date: 11/16/2018 CLINICAL DATA:  Right-sided facial droop and aphasia last night, resolved this morning. EXAM: CT HEAD WITHOUT CONTRAST TECHNIQUE: Contiguous axial images were obtained from the base of the skull through the vertex without intravenous contrast. COMPARISON:  MR brain and CT head dated February 24, 2007. FINDINGS: Brain: No evidence of acute infarction, hemorrhage, hydrocephalus, extra-axial collection or mass lesion/mass effect. Unchanged left occipital lobe  encephalomalacia. Relatively unchanged mild diffuse cerebral atrophy. Progressive moderate chronic microvascular ischemic changes. Vascular: Calcified atherosclerosis at the skullbase. No hyperdense vessel. Skull: Negative for fracture or focal lesion. Sinuses/Orbits: No acute finding. Other: None. IMPRESSION: 1. No acute intracranial abnormality. Unchanged old left occipital lobe infarct. 2. Progressive moderate chronic microvascular ischemic changes. Electronically Signed   By: Titus Dubin M.D.   On: 11/16/2018 13:36    ____________________________________________   PROCEDURES  Procedure(s) performed:   Procedures  None ____________________________________________   INITIAL IMPRESSION / ASSESSMENT AND PLAN / ED COURSE  Pertinent labs & imaging results that were available during my care of the patient were reviewed by me and considered in my medical decision making (see chart for details).  Patient presents to the emergency department with symptoms concerning for TIA.  She has no focal neurologic deficits on my exam.  Symptoms occurred last night.  Patient is high risk for stroke.  Plan for CT and labs here but will likely require admission for TIA work-up.  Labs and CT reviewed. No acute findings. Old CVA noted. Plan for admit.   Discussed patient's case with Hospitalist to request admission. Patient and family (if present) updated with plan. Care transferred to  Hospitlaist service.  I reviewed all nursing notes, vitals, pertinent old records, EKGs, labs, imaging (as available).  ____________________________________________  FINAL CLINICAL IMPRESSION(S) / ED DIAGNOSES  Final diagnoses:  TIA (transient ischemic attack)     Note:  This document was prepared using Dragon voice recognition software and may include unintentional dictation errors.  Nanda Quinton, MD Emergency Medicine    Long, Wonda [facility], MD 11/16/18 Karl Bales

## 2018-11-16 NOTE — ED Notes (Signed)
Report given to Rachel, RN.

## 2018-11-16 NOTE — Assessment & Plan Note (Signed)
Hold metformin and Byetta.  Place on SSI protocol. Check HgA1C.

## 2018-11-16 NOTE — Assessment & Plan Note (Signed)
Continue Benicar or hospital-equivalent. Prn clonidine for HTN

## 2018-11-16 NOTE — Progress Notes (Addendum)
[facility] text paged of patient's arrival to [id number] via [facility].  [facility] admissions called back, said Dr. Alcario Drought would be admitting this pt and is aware of their arrival.

## 2018-11-16 NOTE — H&P (Addendum)
History and Physical    Anna Hunt EXB:[id number] DOB: [birth year] DOA: 11/16/2018  PCP: Lawerance Cruel, MD  Patient coming from: Home  I have personally briefly reviewed patient's old medical records in [place]  Chief Complaint: Transient aphasia  HPI: Anna Hunt is a 73 y.o. female with medical history significant of DM2, HTN, prior L occipital stroke.  Patient presents to the ED at [facility] with c/o Transient aphasia that occurred at dinner the night before.  The evening of 3/7, she was at dinner when she apparently had an episode of word finding difficulty.  This witnessed by sister.  Lasted 5-10 mins per each of 2 episodes.  Sister also noted R sided facial droop during the episodes.  Didn't call EMS and has been symptom free since that time and throughout the day today.  Her other sister found out about what happened this morning, and convinced the patient to present to the ED.   ED Course: Patient remained asymptomatic in ED.  CT head neg.   Review of Systems: As per HPI otherwise 10 point review of systems negative.   Past Medical History:  Diagnosis Date  . Diabetes mellitus without complication ([place])   . Displacement of cervical intervertebral disc without myelopathy   . LBBB (left bundle branch block)   . Occipital stroke ([place]) - left   . Unspecified cerebral artery occlusion with cerebral infarction   . Vertigo     Past Surgical History:  Procedure Laterality Date  . BACK SURGERY       reports that she has never smoked. She has never used smokeless tobacco. She reports that she does not drink alcohol or use drugs.  Allergies  Allergen Reactions  . Niaspan [Niacin Er]     Family History  Problem Relation Age of Onset  . Heart attack Sister 33  Fairly extensive FHx of multiple members with CAD including early onset CAD.  Prior to Admission medications   Medication Sig Start Date End Date Taking? Authorizing Provider  ALPRAZolam Duanne Moron)  0.5 MG tablet 0.5 mg daily. 12/11/12   [provider]  aspirin 81 MG tablet Take 81 mg by mouth daily.    [provider]  BD PEN NEEDLE NANO U/F 32G X 4 MM MISC  12/26/12   [provider]  BENICAR 20 MG tablet 20 mg daily. 01/07/13   [provider]  BYETTA 10 MCG PEN 10 MCG/0.04ML SOPN 10 mcg as directed. 12/26/12   [provider]  Cholecalciferol (VITAMIN D) 2000 UNITS CAPS Take by mouth daily.    [provider]  metFORMIN (GLUCOPHAGE-XR) 500 MG 24 hr tablet 500 mg daily. 01/05/13   [provider]  Multiple Vitamins-Minerals (MULTIVITAMIN PO) Take by mouth daily.    [provider]  PARoxetine (PAXIL) 20 MG tablet 20 mg daily. 01/05/13   [provider]  simvastatin (ZOCOR) 40 MG tablet 40 mg daily. 01/05/13   [provider]    Physical Exam: Vitals:   11/16/18 1645 11/16/18 1829 11/16/18 1841 11/16/18 2143  BP: 132/81 135/74 (!) 154/84 (!) 157/75  Pulse: 84 82 80 84  Resp: 16 18  18   Temp:    98.9 F (37.2 C)  TempSrc:    Oral  SpO2: 95% 96% 95% 96%  Weight:    74.5 kg  Height:    5' 2.5" (1.588 m)    Constitutional: NAD, calm, comfortable Eyes: PERRL, lids and conjunctivae normal ENMT: Mucous membranes  are moist. Posterior pharynx clear of any exudate or lesions.Normal dentition.  Neck: normal, supple, no masses, no thyromegaly Respiratory: clear to auscultation bilaterally, no wheezing, no crackles. Normal respiratory effort. No accessory muscle use.  Cardiovascular: Regular rate and rhythm, no murmurs / rubs / gallops. No extremity edema. 2+ pedal pulses. No carotid bruits.  Abdomen: no tenderness, no masses palpated. No hepatosplenomegaly. Bowel sounds positive.  Musculoskeletal: no clubbing / cyanosis. No joint deformity upper and lower extremities. Good ROM, no contractures. Normal muscle tone.  Skin: no rashes, lesions, ulcers. No induration Neurologic: CN 2-12 grossly intact.  Sensation intact, DTR normal. Strength 5/5 in all 4.  Psychiatric: Normal judgment and insight. Alert and oriented x 3. Normal mood.    Labs on Admission: I have personally reviewed following labs and imaging studies  CBC: Recent Labs  Lab 11/16/18 1256  WBC 9.9  NEUTROABS 7.7  HGB 12.8  HCT 39.4  MCV 89.3  PLT 161   Basic Metabolic Panel: Recent Labs  Lab 11/16/18 1256  NA 136  K 3.8  CL 107  CO2 20*  GLUCOSE 174*  BUN 13  CREATININE 0.65  CALCIUM 9.3   GFR: Estimated Creatinine Clearance: 60.8 mL/min (by C-G formula based on SCr of 0.65 mg/dL). Liver Function Tests: Recent Labs  Lab 11/16/18 1256  AST 28  ALT 21  ALKPHOS 77  BILITOT 0.8  PROT 7.4  ALBUMIN 4.3   No results for input(s): LIPASE, AMYLASE in the last 168 hours. No results for input(s): AMMONIA in the last 168 hours. Coagulation Profile: Recent Labs  Lab 11/16/18 1256  INR 0.9   Cardiac Enzymes: No results for input(s): CKTOTAL, CKMB, CKMBINDEX, TROPONINI in the last 168 hours. BNP (last 3 results) No results for input(s): PROBNP in the last 8760 hours. HbA1C: No results for input(s): HGBA1C in the last 72 hours. CBG: Recent Labs  Lab 11/16/18 1243 11/16/18 2146  GLUCAP 157* 145*   Lipid Profile: No results for input(s): CHOL, HDL, LDLCALC, TRIG, CHOLHDL, LDLDIRECT in the last 72 hours. Thyroid Function Tests: No results for input(s): TSH, T4TOTAL, FREET4, T3FREE, THYROIDAB in the last 72 hours. Anemia Panel: No results for input(s): VITAMINB12, FOLATE, FERRITIN, TIBC, IRON, RETICCTPCT in the last 72 hours. Urine analysis:    Component Value Date/Time   COLORURINE YELLOW 11/16/2018 1526   APPEARANCEUR HAZY (A) 11/16/2018 1526   LABSPEC <1.005 (L) 11/16/2018 1526   PHURINE 6.0 11/16/2018 1526   GLUCOSEU NEGATIVE 11/16/2018 1526   HGBUR NEGATIVE 11/16/2018 1526   BILIRUBINUR NEGATIVE 11/16/2018 1526   KETONESUR NEGATIVE 11/16/2018 1526   PROTEINUR NEGATIVE 11/16/2018 1526    UROBILINOGEN 1.0 06/01/2007 1926   NITRITE NEGATIVE 11/16/2018 1526   LEUKOCYTESUR SMALL (A) 11/16/2018 1526    Radiological Exams on Admission: Ct Head Wo Contrast  Result Date: 11/16/2018 CLINICAL DATA:  Right-sided facial droop and aphasia last night, resolved this morning. EXAM: CT HEAD WITHOUT CONTRAST TECHNIQUE: Contiguous axial images were obtained from the base of the skull through the vertex without intravenous contrast. COMPARISON:  MR brain and CT head dated February 24, 2007. FINDINGS: Brain: No evidence of acute infarction, hemorrhage, hydrocephalus, extra-axial collection or mass lesion/mass effect. Unchanged left occipital lobe encephalomalacia. Relatively unchanged mild diffuse cerebral atrophy. Progressive moderate chronic microvascular ischemic changes. Vascular: Calcified atherosclerosis at the skullbase. No hyperdense vessel. Skull: Negative for fracture or focal lesion. Sinuses/Orbits: No acute finding. Other: None. IMPRESSION: 1. No acute intracranial abnormality. Unchanged old left occipital lobe infarct. 2. Progressive  moderate chronic microvascular ischemic changes. Electronically Signed   By: Titus Dubin M.D.   On: 11/16/2018 13:36    EKG: Independently reviewed.  Assessment/Plan Principal Problem:   TIA (transient ischemic attack) Active Problems:   LBBB (left bundle branch block)   Diabetes mellitus without complication (HCC)   HTN (hypertension), benign    1. TIA - 1. TIA pathway and work up 2. MRI/MRA 3. Cont ASA 81 for now, continue her max dose statin 4. Call neuro for consult if MRI positive, otherwise may be able to f/u outpt if this and remainder of WU negative 5. Carotid duplex 6. 2d echo 7. Tele monitor 8. A1C, FLP 2. DM2 - 1. Hold metformin 2. Continue Byetta 3. Mod scale SSI AC/HS 3. HTN - 1. If MRI is negative, then resume Benicar in AM (once we can verify dose). 4. LBBB - 1. Apparently new since we last had EKG 2. Send for PCP records to  see if they have any records or EKG showing this 3. 2d echo being obtained anyhow 4. Asymptomatic from cardiac standpoint, and no h/o CP, SOB, etc. so dont think theres an acute cardiac event at this point. 5. However, depending on above results, may or may not need to involve cards.  DVT prophylaxis: Lovenox Code Status: Partial: everything but intubation Family Communication: Family at bedside Disposition Plan: Home after admit Consults called: None Admission status: Place in 36     Hawke Villalpando, [place] Hospitalists  How to contact the [facility] Attending or Consulting provider Doctor Phillips or covering provider during after hours [place], for this patient?  1. Check the care team in [facility] and look for a) attending/consulting TR[facility] provider listed and b) the [facility] team listed 2. Log into [email or web address]  Amion Physician Scheduling and messaging for groups and whole hospitals  On call and physician scheduling software for group practices, residents, hospitalists and other medical providers for call, clinic, rotation and shift schedules. OnCall [employer] is a hospital-wide system for scheduling doctors and paging doctors on call. EasyPlot is for scientific plotting and data analysis.  [email or web address]  and use [place]'s universal password to access. If you do not have the password, please contact the hospital operator.  3. Locate the [facility] provider you are looking for under [facility] and page to a number that you can be directly reached. 4. If you still have difficulty reaching the provider, please page the [facility] (Director on Call) for the Hospitalists listed on amion for assistance.  11/16/2018, 10:20 PM

## 2018-11-16 NOTE — Assessment & Plan Note (Signed)
Admit to observation telemetry bed. Check carotid U/S, MRI/MRA brain. Check bubble echo.  Continue ASA 81 mg daily. Pt with LBBB.  Check lipid panel. Continue Zocor. Start lovenox 40 mg SQ daily for DVT prophylaxis.  Likely DC to home tomorrow if workup is negative.

## 2018-11-16 NOTE — ED Triage Notes (Signed)
Patient states that last night at dinner unknown time that she had some aphagia and confusion at dinner. The patient states that her daughter was with her and the patient had a facial droop. The patient reports that all of these symptoms have resolved at this time

## 2018-11-16 NOTE — Progress Notes (Signed)
Admissions screening questions done, pt verbalized w/ Tracie RN witnessing that she wants her code status to be do not intubate, but okay for all other measures. Per pt "do not want to be hooked up to a bunch of machines".

## 2018-11-17 ENCOUNTER — Other Ambulatory Visit (HOSPITAL_COMMUNITY): Payer: Medicare Other

## 2018-11-17 ENCOUNTER — Observation Stay (HOSPITAL_COMMUNITY): Payer: Medicare Other

## 2018-11-17 ENCOUNTER — Observation Stay (HOSPITAL_BASED_OUTPATIENT_CLINIC_OR_DEPARTMENT_OTHER): Payer: Medicare Other

## 2018-11-17 ENCOUNTER — Other Ambulatory Visit: Payer: Self-pay | Admitting: Cardiology

## 2018-11-17 DIAGNOSIS — G459 Transient cerebral ischemic attack, unspecified: Secondary | ICD-10-CM

## 2018-11-17 DIAGNOSIS — R0602 Shortness of breath: Secondary | ICD-10-CM

## 2018-11-17 DIAGNOSIS — R4701 Aphasia: Secondary | ICD-10-CM | POA: Diagnosis not present

## 2018-11-17 DIAGNOSIS — R29810 Facial weakness: Secondary | ICD-10-CM | POA: Diagnosis not present

## 2018-11-17 LAB — LIPID PANEL
Cholesterol: 120 mg/dL (ref 0–200)
HDL: 34 mg/dL — ABNORMAL LOW (ref >40)
LDL Cholesterol: 41 mg/dL (ref 0–99)
Total CHOL/HDL Ratio: 3.5 RATIO
Triglycerides: 226 mg/dL — ABNORMAL HIGH (ref <150)
VLDL: 45 mg/dL — ABNORMAL HIGH (ref 0–40)

## 2018-11-17 LAB — ECHOCARDIOGRAM COMPLETE
Height: 62.5 in
Weight: 2627.2 oz

## 2018-11-17 LAB — HEMOGLOBIN A1C
Hgb A1c MFr Bld: 6.2 % — ABNORMAL HIGH (ref 4.8–5.6)
Mean Plasma Glucose: 131.24 mg/dL

## 2018-11-17 LAB — GLUCOSE, CAPILLARY
Glucose-Capillary: 238 mg/dL — ABNORMAL HIGH (ref 70–99)
Glucose-Capillary: 145 mg/dL — ABNORMAL HIGH (ref 70–99)

## 2018-11-17 MED ORDER — PANTOPRAZOLE SODIUM 40 MG PO TBEC: 40.0000 mg | DELAYED_RELEASE_TABLET | Freq: Every day | ORAL | 0 refills | Status: DC

## 2018-11-17 MED ORDER — CLOPIDOGREL BISULFATE 75 MG PO TABS: 75.0000 mg | ORAL_TABLET | Freq: Every day | ORAL | 0 refills | Status: DC

## 2018-11-17 MED ORDER — PANTOPRAZOLE SODIUM 40 MG PO TBEC
40.0000 mg | DELAYED_RELEASE_TABLET | Freq: Every day | ORAL | Status: DC
  Administered 2018-11-17: 40 mg via ORAL
  Filled 2018-11-17: qty 1

## 2018-11-17 MED ORDER — ASPIRIN 81 MG PO TABS: 81.0000 mg | ORAL_TABLET | ORAL | 0 refills | Status: DC

## 2018-11-17 MED ORDER — CLOPIDOGREL BISULFATE 75 MG PO TABS
75.0000 mg | ORAL_TABLET | Freq: Every day | ORAL | Status: DC
  Administered 2018-11-17: 75 mg via ORAL
  Filled 2018-11-17: qty 1

## 2018-11-17 NOTE — Discharge Instructions (Signed)
Follow with Primary MD Lawerance Cruel, MD in 7 days   Get CBC, CMP checked  by Primary MD  in 5-7 days   Activity: As tolerated with Full fall precautions use Bluitt/cane & assistance as needed  Disposition Home    Diet: Heart Healthy Low Carb    Special Instructions: If you have smoked or chewed Tobacco  in the last 2 yrs please stop smoking, stop any regular Alcohol  and or any Recreational drug use.  On your next visit with your primary care physician please Get Medicines reviewed and adjusted.  Please request your Prim.MD to go over all Hospital Tests and Procedure/Radiological results at the follow up, please get all Hospital records sent to your Prim MD by signing hospital release before you go home.  If you experience worsening of your admission symptoms, develop shortness of breath, life threatening emergency, suicidal or homicidal thoughts you must seek medical attention immediately by calling 911 or calling your MD immediately  if symptoms less severe.  You Must read complete instructions/literature along with all the possible adverse reactions/side effects for all the Medicines you take and that have been prescribed to you. Take any new Medicines after you have completely understood and accpet all the possible adverse reactions/side effects.

## 2018-11-17 NOTE — Progress Notes (Signed)
Discharge instructions reviewed with pt and her sister.  Copy of instructions and scripts given to pt.             Discussed and reviewed TIA/STROKE signs and symptoms with pt and her sister. Handouts given on new meds and on TIA, pt also going home with stroke booklet that pt given last pm.  Pt verbalized understanding and able to teach back, knows what to look for and when to call 911 (BE FAST reviewed also with pt and sister).   Pt ready for discharge, waiting for rolling Ligman to be delivered to her room.  Pt/sister will notify front desk when Hernandez arrives and let desk know they are ready to be taken out.

## 2018-11-17 NOTE — Progress Notes (Signed)
VASCULAR LAB PRELIMINARY  PRELIMINARY  PRELIMINARY  PRELIMINARY  Carotid duplex completed.    Preliminary report:   See CV Proc for results  Anna Hunt, [place]    11/17/2018, 10:15 AM

## 2018-11-17 NOTE — Evaluation (Signed)
Occupational Therapy Evaluation Patient Details Name: Anna Hunt MRN: [id number] DOB: [birth year] Today's Date: 11/17/2018    History of Present Illness  LATTIE RIEGE is a 73 y.o. female with medical history significant of DM2, HTN, prior L occipital stroke.   Clinical Impression   Patient presenting with decreased I in self care, balance, functional mobility/transfers, endurance, strength, and safety awareness.  Patient reports being mod I  PTA. Patient currently functioning at supervision - min guard without use of AD.Patient will benefit from acute OT to increase overall independence in the areas of ADLs, functional mobility, and safety awareness in order to safely discharge home. Pt and caregiver requesting Carlos therapy intervention upon first returning home for safety.    Follow Up Recommendations  Home health OT;Supervision - Intermittent    Equipment Recommendations  None recommended by OT    Recommendations for Other Services Other (comment)(none at this time)     Precautions / Restrictions Precautions Precautions: Fall Precaution Comments: pt reports she feels off balanced and weak grip strength Restrictions Weight Bearing Restrictions: No Other Position/Activity Restrictions: Pt denies history of falls      Mobility Bed Mobility Overal bed mobility: Independent        Transfers Overall transfer level: Needs assistance   Transfers: Sit to/from Stand;Stand Pivot Transfers Sit to Stand: Supervision Stand pivot transfers: Min guard       General transfer comment: min verbal cuing for technique    Balance Overall balance assessment: Needs assistance   Sitting balance-Leahy Scale: Normal       Standing balance-Leahy Scale: Fair Standing balance comment: min guard with dynamic balance challenge     Standardized Balance Assessment Standardized Balance Assessment : Berg Balance Test Berg Balance Test Sit to Stand: Able to stand using hands after several  tries Standing Unsupported: Able to stand safely 2 minutes Sitting with Back Unsupported but Feet Supported on Floor or Stool: Able to sit safely and securely 2 minutes Stand to Sit: Controls descent by using hands Transfers: Able to transfer safely, definite need of hands Standing Unsupported with Eyes Closed: Able to stand 10 seconds with supervision Standing Ubsupported with Feet Together: Able to place feet together independently and stand for 1 minute with supervision From Standing, Reach Forward with Outstretched Arm: Can reach forward >5 cm safely (2") From Standing Position, Pick up Object from Floor: Able to pick up shoe, needs supervision From Standing Position, Turn to Look Behind Over each Shoulder: Turn sideways only but maintains balance Turn 360 Degrees: Able to turn 360 degrees safely but slowly Standing Unsupported, Alternately Place Feet on Step/Stool: Able to complete >2 steps/needs minimal assist Standing Unsupported, One Foot in Front: Needs help to step but can hold 15 seconds Standing on One Leg: Able to lift leg independently and hold equal to or more than 3 seconds Total Score: 35       ADL either performed or assessed with clinical judgement   ADL Overall ADL's : Needs assistance/impaired     Grooming: Supervision/safety;Standing;Wash/dry hands;Wash/dry face;Oral care   Upper Body Bathing: Set up;Sitting   Lower Body Bathing: Min guard;Sit to/from stand   Upper Body Dressing : Set up;Sitting   Lower Body Dressing: Min guard;Sit to/from stand   Toilet Transfer: Supervision/safety   Toileting- Water quality scientist and Hygiene: Min guard   Tub/ Banker: Minimal assistance           Vision Baseline Vision/History: Wears glasses Wears Glasses: Reading only Patient Visual Report: No change  from baseline              Pertinent Vitals/Pain Pain Assessment: No/denies pain     Hand Dominance Right   Extremity/Trunk Assessment Upper  Extremity Assessment Upper Extremity Assessment: Generalized weakness   Lower Extremity Assessment Lower Extremity Assessment: Generalized weakness   Cervical / Trunk Assessment Cervical / Trunk Assessment: Normal   Communication Communication Communication: No difficulties   Cognition Arousal/Alertness: Awake/alert Behavior During Therapy: WFL for tasks assessed/performed Overall Cognitive Status: Within Functional Limits for tasks assessed       General Comments: pt is forgetful - this is normal for her   General Comments  pt has hardest time with left leg stance - she had good sensation and strenght in this leg - recommend continued assessment at OPPT setting    Exercises Other Exercises Other Exercises: standing at sink - up and down on toes Other Exercises: standing at sink - hip abduction - keeping trunk tall Other Exercises: sit to stands - no hands - sitting slowly x 3   Shoulder Instructions      Home Living Family/patient expects to be discharged to:: Private residence Living Arrangements: Alone Available Help at Discharge: Available PRN/intermittently;Family;Friend(s) Type of Home: House       Home Layout: One level      Home Equipment: None      Lives With: Alone    Prior Functioning/Environment Level of Independence: Independent        Comments: pt drove only short distances. her first stroke left her with some perpherial vision loss        OT Problem List: Decreased strength;Impaired vision/perception;Decreased knowledge of use of DME or AE;Decreased coordination;Decreased activity tolerance;Cardiopulmonary status limiting activity;Impaired balance (sitting and/or standing);Decreased safety awareness      OT Treatment/Interventions: Self-care/ADL training;Balance training;Therapeutic exercise;Neuromuscular education;Therapeutic activities;Energy conservation;Cognitive remediation/compensation;Visual/perceptual remediation/compensation;DME  and/or AE instruction;Manual therapy;Patient/family education    OT Goals(Current goals can be found in the care plan section) Acute Rehab OT Goals Patient Stated Goal: to get home and be safe OT Goal Formulation: With patient/family Time For Goal Achievement: 12/01/18 Potential to Achieve Goals: Good ADL Goals Pt Will Perform Lower Body Bathing: with modified independence Pt Will Perform Lower Body Dressing: with modified independence Pt Will Transfer to Toilet: with modified independence Pt Will Perform Toileting - Clothing Manipulation and hygiene: with modified independence Pt Will Perform Tub/Shower Transfer: Tub transfer;with modified independence  OT Frequency: Min 2X/week   Barriers to D/C: Other (comment)  pt lives alone          AM-PAC OT "6 Clicks" Daily Activity     Outcome Measure Help from another person eating meals?: None Help from another person taking care of personal grooming?: A Little Help from another person toileting, which includes using toliet, bedpan, or urinal?: A Little Help from another person bathing (including washing, rinsing, drying)?: A Little Help from another person to put on and taking off regular upper body clothing?: A Little Help from another person to put on and taking off regular lower body clothing?: A Little 6 Click Score: 19   End of Session Nurse Communication: Mobility status  Activity Tolerance: Patient tolerated treatment well Patient left: in bed;with call bell/phone within reach;with family/visitor present  OT Visit Diagnosis: Muscle weakness (generalized) (M62.81)                Time: 2536-6440 OT Time Calculation (min): 19 min Charges:  OT General Charges $OT Visit: 1 Visit OT Evaluation $OT Eval  Low Complexity: 1 Low Starleen Trussell P, MS, OTR/L 11/17/2018, 1:00 PM

## 2018-11-17 NOTE — Discharge Summary (Signed)
Anna Hunt TTS:[id number] DOB: 08-09-1946 DOA: 11/16/2018  PCP: Lawerance Cruel, MD  Admit date: 11/16/2018  Discharge date: 11/17/2018  Admitted From: Home  Disposition:  Home   Recommendations for Outpatient Follow-up:   Follow up with PCP in 1-2 weeks  PCP Please obtain BMP/CBC, 2 view CXR in 1week,  (see Discharge instructions)   PCP Please follow up on the following pending results:    Home Health: PT,RN   Equipment/Devices: Gro[place]- rolling  Consultations: Neuro Dr Erlinda Hong - phone Discharge Condition: Stable   CODE STATUS: Full   Diet Recommendation: Heart Healthy Low Carb     Chief Complaint  Patient presents with  . Facial Droop     Brief history of present illness from the day of admission and additional interim summary    Anna Hunt is a 73 y.o. female with medical history significant of DM2, HTN, prior L occipital stroke.  Patient presents to the ED at [facility] with c/o Transient aphasia that occurred at dinner the night before.                                                                 Hospital Course    1.  Transient expressive aphasia, dysarthria.  All resolved.  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  Case discussed with neurologist Dr. Beatriz Chancellor patient will be placed on aspirin and Plavix along with her home dose statin, after 21 days stop aspirin and continue Plavix and statin indefinitely.  She will also get a 30-day event monitor which I have requested cardiology to provide her with.  Home PT RN and rolling [place], outpatient neurology follow-up.  Request PCP to please make sure patient gets a 30-day event  monitor and outpatient neuro follow-up.  2.  DM type II.  Stable A1c was 6.2 continue home regimen.  3.  Pretension dyslipidemia.  Stable continue home medications unchanged.  4.  Left bundle branch block.  No recent EKG in file, chest pain-free, already on dual antiplatelet therapy beta-blocker and statin, echo shows preserved EF of 55% without any wall motion abnormality.   Discharge diagnosis     Principal Problem:   TIA (transient ischemic attack) Active Problems:   LBBB (left bundle branch block)   Diabetes mellitus without complication ([place])   HTN (hypertension), benign    Discharge instructions    Discharge Instructions    Discharge instructions   Complete by:  As directed    Follow with Primary MD Lawerance Cruel, MD in 7 days   Get CBC, CMP checked  by Primary MD  in 5-7 days  Activity: As tolerated with Full fall precautions use [place]/cane & assistance as needed  Disposition Home    Diet: Heart Healthy Low Carb    Special Instructions: If you have smoked or chewed Tobacco  in the last 2 yrs please stop smoking, stop any regular Alcohol  and or any Recreational drug use.  On your next visit with your primary care physician please Get Medicines reviewed and adjusted.  Please request your Prim.MD to go over all Hospital Tests and Procedure/Radiological results at the follow up, please get all Hospital records sent to your Prim MD by signing hospital release before you go home.  If you experience worsening of your admission symptoms, develop shortness of breath, life threatening emergency, suicidal or homicidal thoughts you must seek medical attention immediately by calling 911 or calling your MD immediately  if symptoms less severe.  You Must read complete instructions/literature along with all the possible adverse reactions/side effects for all the Medicines you take and that have been prescribed to you. Take any new Medicines after you have completely  understood and accpet all the possible adverse reactions/side effects.   Increase activity slowly   Complete by:  As directed       Discharge Medications   Allergies as of 11/17/2018      Reactions   Niaspan [niacin Er] Hives, Swelling   welps      Medication List    TAKE these medications   ALPRAZolam 0.5 MG tablet Commonly known as:  XANAX Take 0.5 mg by mouth at bedtime.   aspirin 81 MG tablet Take 1 tablet (81 mg total) by mouth every morning. You will take this aspirin along with Plavix for 3 weeks after that stop aspirin and continue Plavix. What changed:  additional instructions   BD Pen Needle Nano U/F 32G X 4 MM Misc Generic drug:  Insulin Pen Needle   Benicar 20 MG tablet Generic drug:  olmesartan Take 20 mg by mouth every morning.   Byetta 10 MCG Pen 10 MCG/0.04ML Sopn injection Generic drug:  exenatide Inject 10 mcg into the skin 2 (two) times daily.   clopidogrel 75 MG tablet Commonly known as:  PLAVIX Take 1 tablet (75 mg total) by mouth daily.   metFORMIN 500 MG 24 hr tablet Commonly known as:  GLUCOPHAGE-XR Take 1,000 mg by mouth at bedtime.   pantoprazole 40 MG tablet Commonly known as:  PROTONIX Take 1 tablet (40 mg total) by mouth daily.   PARoxetine 20 MG tablet Commonly known as:  PAXIL Take 20 mg by mouth at bedtime.   simvastatin 40 MG tablet Commonly known as:  ZOCOR Take 40 mg by mouth at bedtime.   Vitamin D 50 MCG (2000 UT) Caps Take 2,000 Units by mouth every morning.            Durable Medical Equipment  (From admission, onward)         Start     Ordered   11/17/18 1135  For home use only DME [place] rolling  Once    Comments:  5 wheel  Question:  Patient needs a [place] to treat with the following condition  Answer:  TIA (transient ischemic attack)   11/17/18 1134          Follow-up Information    Lawerance Cruel, MD. Schedule an appointment as soon as possible for a visit in 1 week(s).   Specialty:  Family  Medicine Contact information: [address] [place] [address] [phone]  [facility]. Schedule an appointment as soon as possible for a visit in 1 week(s).   Why:  TIA Contact information: [address]     [place] [address] [phone]          Major procedures and Radiology Reports - PLEASE review detailed and final reports thoroughly  -     TTE   1. The left ventricle has normal systolic function, with an ejection fraction of 55-60%. The cavity size was normal. Left ventricular diastolic Doppler parameters are consistent with impaired relaxation No evidence of left ventricular regional wall  motion abnormalities.  2. The right ventricle has normal systolic function. The cavity was normal. There is no increase in right ventricular wall thickness.  3. The mitral valve is normal in structure. There is mild mitral annular calcification present. No evidence of mitral valve stenosis. No mitral regurgitation.  4. The tricuspid valve is normal in structure.  5. The aortic valve is tricuspid Moderate calcification of the aortic valve. no stenosis of the aortic valve.  6. The pulmonic valve was normal in structure.  7. The aortic root and ascending aorta are normal in size and structure.  8. IVC was normal in size. PA systolic pressure 32 mmHg.   Ct Head Wo Contrast  Result Date: 11/16/2018 CLINICAL DATA:  Right-sided facial droop and aphasia last night, resolved this morning. EXAM: CT HEAD WITHOUT CONTRAST TECHNIQUE: Contiguous axial images were obtained from the base of the skull through the vertex without intravenous contrast. COMPARISON:  MR brain and CT head dated February 24, 2007. FINDINGS: Brain: No evidence of acute infarction, hemorrhage, hydrocephalus, extra-axial collection or mass lesion/mass effect. Unchanged left occipital lobe encephalomalacia. Relatively unchanged mild diffuse cerebral atrophy. Progressive  moderate chronic microvascular ischemic changes. Vascular: Calcified atherosclerosis at the skullbase. No hyperdense vessel. Skull: Negative for fracture or focal lesion. Sinuses/Orbits: No acute finding. Other: None. IMPRESSION: 1. No acute intracranial abnormality. Unchanged old left occipital lobe infarct. 2. Progressive moderate chronic microvascular ischemic changes. Electronically Signed   By: Titus Dubin M.D.   On: 11/16/2018 13:36   Mr Brain Wo Contrast  Result Date: 11/17/2018 CLINICAL DATA:  RIGHT facial droop and aphasia. History of stroke, diabetes and vertigo. EXAM: MRI HEAD WITHOUT CONTRAST MRA HEAD WITHOUT CONTRAST TECHNIQUE: Multiplanar, multiecho pulse sequences of the brain and surrounding structures were obtained without intravenous contrast. Angiographic images of the head were obtained using MRA technique without contrast. COMPARISON:  CT HEAD November 16, 2018.  MRI/MRA head February 27, 2007. FINDINGS: MRI HEAD FINDINGS INTRACRANIAL CONTENTS: No reduced diffusion to suggest acute ischemia. No susceptibility artifact to suggest hemorrhage. LEFT temporal occipital encephalomalacia with ex vacuo dilatation LEFT lateral ventricle. No hydrocephalus. Old small RIGHT cerebellar infarct. Old small LEFT thalamus infarct. Patchy to confluent supratentorial and pontine white matter FLAIR T2 hyperintensities. No midline shift, mass effect or masses. VASCULAR: Normal major intracranial vascular flow voids present at skull base. SKULL AND UPPER CERVICAL SPINE: No abnormal sellar expansion. No suspicious calvarial bone marrow signal. Low T1 signal included cervical spinal cord favored as artifact, less likely syrinx. Craniocervical junction maintained. SINUSES/ORBITS: The mastoid air-cells and included paranasal sinuses are well-aerated.The included ocular globes and orbital contents are non-suspicious. Status post bilateral ocular lens implants. OTHER: Patient is edentulous. MRA HEAD FINDINGS ANTERIOR  CIRCULATION: Normal flow related enhancement of the included cervical, petrous, cavernous and supraclinoid internal carotid arteries. Patent anterior communicating artery. Patent anterior and middle cerebral arteries. No large vessel  occlusion, flow limiting stenosis, or aneurysm. POSTERIOR CIRCULATION: LEFT vertebral artery is dominant. Vertebrobasilar arteries are patent, with normal flow related enhancement of the main branch vessels. Patent posterior cerebral arteries. Moderate chronic luminal irregularity LEFT PCA. Fetal origin LEFT posterior cerebral artery. No large vessel occlusion, flow limiting stenosis, or aneurysm. ANATOMIC VARIANTS: None. Source data and MIP images were reviewed. IMPRESSION: MRI HEAD: 1. No acute intracranial process. 2. Old LEFT temporal occipital and LEFT thalamus PCA territory infarcts. Old small RIGHT cerebellar infarct. 3. Moderate chronic small vessel ischemic changes. MRA HEAD: 1. No emergent large vessel occlusion or flow-limiting stenosis. Electronically Signed   By: Elon Alas M.D.   On: 11/17/2018 02:06   Mr Jodene Nam Head Wo Contrast  Result Date: 11/17/2018 CLINICAL DATA:  RIGHT facial droop and aphasia. History of stroke, diabetes and vertigo. EXAM: MRI HEAD WITHOUT CONTRAST MRA HEAD WITHOUT CONTRAST TECHNIQUE: Multiplanar, multiecho pulse sequences of the brain and surrounding structures were obtained without intravenous contrast. Angiographic images of the head were obtained using MRA technique without contrast. COMPARISON:  CT HEAD November 16, 2018.  MRI/MRA head February 27, 2007. FINDINGS: MRI HEAD FINDINGS INTRACRANIAL CONTENTS: No reduced diffusion to suggest acute ischemia. No susceptibility artifact to suggest hemorrhage. LEFT temporal occipital encephalomalacia with ex vacuo dilatation LEFT lateral ventricle. No hydrocephalus. Old small RIGHT cerebellar infarct. Old small LEFT thalamus infarct. Patchy to confluent supratentorial and pontine white matter FLAIR T2  hyperintensities. No midline shift, mass effect or masses. VASCULAR: Normal major intracranial vascular flow voids present at skull base. SKULL AND UPPER CERVICAL SPINE: No abnormal sellar expansion. No suspicious calvarial bone marrow signal. Low T1 signal included cervical spinal cord favored as artifact, less likely syrinx. Craniocervical junction maintained. SINUSES/ORBITS: The mastoid air-cells and included paranasal sinuses are well-aerated.The included ocular globes and orbital contents are non-suspicious. Status post bilateral ocular lens implants. OTHER: Patient is edentulous. MRA HEAD FINDINGS ANTERIOR CIRCULATION: Normal flow related enhancement of the included cervical, petrous, cavernous and supraclinoid internal carotid arteries. Patent anterior communicating artery. Patent anterior and middle cerebral arteries. No large vessel occlusion, flow limiting stenosis, or aneurysm. POSTERIOR CIRCULATION: LEFT vertebral artery is dominant. Vertebrobasilar arteries are patent, with normal flow related enhancement of the main branch vessels. Patent posterior cerebral arteries. Moderate chronic luminal irregularity LEFT PCA. Fetal origin LEFT posterior cerebral artery. No large vessel occlusion, flow limiting stenosis, or aneurysm. ANATOMIC VARIANTS: None. Source data and MIP images were reviewed. IMPRESSION: MRI HEAD: 1. No acute intracranial process. 2. Old LEFT temporal occipital and LEFT thalamus PCA territory infarcts. Old small RIGHT cerebellar infarct. 3. Moderate chronic small vessel ischemic changes. MRA HEAD: 1. No emergent large vessel occlusion or flow-limiting stenosis. Electronically Signed   By: Elon Alas M.D.   On: 11/17/2018 02:06   Vas US Carotid (at [place] Only)  Result Date: 11/17/2018 Carotid Arterial Duplex Study Indications:  TIA. Risk Factors: Diabetes. Performing Technologist: Abram Sander RVS  Examination Guidelines: A complete evaluation includes B-mode imaging, spectral  Doppler, color Doppler, and power Doppler as needed of all accessible portions of each vessel. Bilateral testing is considered an integral part of a complete examination. Limited examinations for reoccurring indications may be performed as noted.  Right Carotid Findings: +----------+--------+--------+--------+------------+--------+           PSV cm/sEDV cm/sStenosisDescribe    Comments +----------+--------+--------+--------+------------+--------+ CCA Prox  143     9               homogeneous          +----------+--------+--------+--------+------------+--------+  CCA Distal57      10              homogeneous          +----------+--------+--------+--------+------------+--------+ ICA Prox  64      16      1-39%   heterogenous         +----------+--------+--------+--------+------------+--------+ ICA Distal49      9                                    +----------+--------+--------+--------+------------+--------+ ECA       89                                           +----------+--------+--------+--------+------------+--------+ +----------+--------+-------+--------+-------------------+           PSV cm/sEDV cmsDescribeArm Pressure (mmHG) +----------+--------+-------+--------+-------------------+ [id number]                                         +----------+--------+-------+--------+-------------------+ +---------+--------+--+--------+-+---------+ VertebralPSV cm/s47EDV cm/s8Antegrade +---------+--------+--+--------+-+---------+  Left Carotid Findings: +----------+--------+--------+--------+-----------+--------+           PSV cm/sEDV cm/sStenosisDescribe   Comments +----------+--------+--------+--------+-----------+--------+ CCA Prox  64      9               homogeneous         +----------+--------+--------+--------+-----------+--------+ CCA Distal50      8               homogeneous          +----------+--------+--------+--------+-----------+--------+ ICA Prox  67      15      1-39%   homogeneous         +----------+--------+--------+--------+-----------+--------+ ICA Distal64      14                                  +----------+--------+--------+--------+-----------+--------+ ECA       91                                          +----------+--------+--------+--------+-----------+--------+ +----------+--------+--------+--------+-------------------+ SubclavianPSV cm/sEDV cm/sDescribeArm Pressure (mmHG) +----------+--------+--------+--------+-------------------+           135                                         +----------+--------+--------+--------+-------------------+ +---------+--------+--+--------+--+---------+ VertebralPSV cm/s55EDV cm/s12Antegrade +---------+--------+--+--------+--+---------+  Summary: Right Carotid: Velocities in the right ICA are consistent with a 1-39% stenosis. Left Carotid: Velocities in the left ICA are consistent with a 1-39% stenosis. Vertebrals: Bilateral vertebral arteries demonstrate antegrade flow. *See table(s) above for measurements and observations.     Preliminary     Micro Results     No results found for this or any previous visit (from the past 240 hour(s)).  Today   Subjective    Anna Hunt today has no headache,no chest abdominal pain,no new weakness tingling or numbness, feels much better wants to go home today.     Objective  Blood pressure (!) 149/82, pulse 74, temperature 98.2 F (36.8 C), resp. rate 16, height 5' 2.5" (1.588 m), weight 74.5 kg, SpO2 97 %.   Intake/Output Summary (Last 24 hours) at 11/17/2018 1142 Last data filed at 11/17/2018 0300 Gross per 24 hour  Intake 222 ml  Output -  Net 222 ml    Exam  Awake Alert, Oriented x 3, No new F.N deficits, Normal affect, chronic right visual field defects  in both eyes [place].AT,PERRAL Supple Neck,No JVD, No cervical lymphadenopathy  appriciated.  Symmetrical Chest wall movement, Good air movement bilaterally, CTAB RRR,No Gallops,Rubs or new Murmurs, No Parasternal Heave +ve B.Sounds, Abd Soft, Non tender, No organomegaly appriciated, No rebound -guarding or rigidity. No Cyanosis, Clubbing or edema, No new Rash or bruise   Data Review   CBC w Diff:  Lab Results  Component Value Date   WBC 9.9 11/16/2018   HGB 12.8 11/16/2018   HCT 39.4 11/16/2018   PLT 250 11/16/2018   LYMPHOPCT 15 11/16/2018   MONOPCT 6 11/16/2018   EOSPCT 1 11/16/2018   BASOPCT 0 11/16/2018    CMP:  Lab Results  Component Value Date   NA 136 11/16/2018   K 3.8 11/16/2018   CL 107 11/16/2018   CO2 20 (L) 11/16/2018   BUN 13 11/16/2018   CREATININE 0.65 11/16/2018   PROT 7.4 11/16/2018   ALBUMIN 4.3 11/16/2018   BILITOT 0.8 11/16/2018   ALKPHOS 77 11/16/2018   AST 28 11/16/2018   ALT 21 11/16/2018  . Lab Results  Component Value Date   HGBA1C 6.2 (H) 11/17/2018   Lab Results  Component Value Date   CHOL 120 11/17/2018   HDL 34 (L) 11/17/2018   LDLCALC 41 11/17/2018   TRIG 226 (H) 11/17/2018   CHOLHDL 3.5 11/17/2018     Total Time in preparing paper work, data evaluation and todays exam - 26 minutes  Lala Lund M.D on 11/17/2018 at 11:42 AM  [facility]   Office  [phone]

## 2018-11-17 NOTE — Evaluation (Signed)
Physical Therapy Evaluation Patient Details Name: Anna Hunt MRN: [id number] DOB: [birth year] Today's Date: 11/17/2018   History of Present Illness   Anna Hunt is a 73 y.o. female with medical history significant of DM2, HTN, prior L occipital stroke.  Clinical Impression  Pt doing well.  She has some mild balance deficits - esp with left leg single limb stance.  Pt given some exercises to do at home.  Pt encouraged to follow up with neuro OPPT.  Emphasis on safety and fall prevention.    Follow Up Recommendations Outpatient PT;Supervision - Intermittent(pt lives near neuro OPPT - this would be best for her balance retraining - pt in agreement)    Equipment Recommendations  None recommended by PT(pt has access to cane)    Recommendations for Other Services       Precautions / Restrictions Precautions Precautions: Fall Precaution Comments: pt reports she feels off balanced. she has been educated to get up with help only Restrictions Weight Bearing Restrictions: No Other Position/Activity Restrictions: Pt denies history of falls      Mobility  Bed Mobility Overal bed mobility: Independent                Transfers Overall transfer level: Needs assistance   Transfers: Sit to/from Stand;Stand Pivot Transfers Sit to Stand: Supervision Stand pivot transfers: Min guard       General transfer comment: pt seems to have harder time getting up and sits wtih poor control. I had her to 3 sit to stands wtihout her UEs with cues for technque  Ambulation/Gait Ambulation/Gait assistance: Min guard Gait Distance (Feet): 120 Feet Assistive device: None Gait Pattern/deviations: Step-through pattern(occasional wobble - no pattern)     General Gait Details: pt reports seh doesnt feel as steady as she did prior to coming in. she walked safely - mild loss of balance in controlled setting  Stairs            Wheelchair Mobility    Modified Rankin (Stroke Patients  Only)       Balance Overall balance assessment: Needs assistance   Sitting balance-Leahy Scale: Normal         Standing balance comment: static standing normal                 Standardized Balance Assessment Standardized Balance Assessment : Berg Balance Test Berg Balance Test Sit to Stand: Able to stand using hands after several tries Standing Unsupported: Able to stand safely 2 minutes Sitting with Back Unsupported but Feet Supported on Floor or Stool: Able to sit safely and securely 2 minutes Stand to Sit: Controls descent by using hands Transfers: Able to transfer safely, definite need of hands Standing Unsupported with Eyes Closed: Able to stand 10 seconds with supervision Standing Ubsupported with Feet Together: Able to place feet together independently and stand for 1 minute with supervision From Standing, Reach Forward with Outstretched Arm: Can reach forward >5 cm safely (2") From Standing Position, Pick up Object from Floor: Able to pick up shoe, needs supervision From Standing Position, Turn to Look Behind Over each Shoulder: Turn sideways only but maintains balance Turn 360 Degrees: Able to turn 360 degrees safely but slowly Standing Unsupported, Alternately Place Feet on Step/Stool: Able to complete >2 steps/needs minimal assist Standing Unsupported, One Foot in Front: Needs help to step but can hold 15 seconds Standing on One Leg: Able to lift leg independently and hold equal to or more than 3 seconds Total Score: 35  Pertinent Vitals/Pain Pain Assessment: No/denies pain    Home Living Family/patient expects to be discharged to:: Private residence Living Arrangements: Alone Available Help at Discharge: Available PRN/intermittently;Family;Friend(s) Type of Home: House       Home Layout: One level Home Equipment: None      Prior Function Level of Independence: Independent         Comments: pt drove only short distances. her first  stroke left her with some perpherial vision loss     Hand Dominance        Extremity/Trunk Assessment        Lower Extremity Assessment Lower Extremity Assessment: Generalized weakness    Cervical / Trunk Assessment Cervical / Trunk Assessment: Normal  Communication   Communication: No difficulties  Cognition Arousal/Alertness: Awake/alert Behavior During Therapy: WFL for tasks assessed/performed Overall Cognitive Status: Within Functional Limits for tasks assessed                                 General Comments: pt is forgetful - this is normal for her      General Comments General comments (skin integrity, edema, etc.): pt has hardest time with left leg stance - she had good sensation and strenght in this leg - recommend continued assessment at OPPT setting    Exercises Other Exercises Other Exercises: standing at sink - up and down on toes Other Exercises: standing at sink - hip abduction - keeping trunk tall Other Exercises: sit to stands - no hands - sitting slowly x 3   Assessment/Plan    PT Assessment Patient needs continued PT services  PT Problem List Decreased balance;Decreased safety awareness       PT Treatment Interventions Functional mobility training;Balance training;DME instruction;Neuromuscular re-education;Patient/family education    PT Goals (Current goals can be found in the Care Plan section)  Acute Rehab PT Goals Patient Stated Goal: to get home and be safe PT Goal Formulation: With patient/family Time For Goal Achievement: 11/24/18 Potential to Achieve Goals: Good    Frequency Min 4X/week   Barriers to discharge        Co-evaluation               AM-PAC PT "6 Clicks" Mobility  Outcome Measure Help needed turning from your back to your side while in a flat bed without using bedrails?: A Little Help needed moving from lying on your back to sitting on the side of a flat bed without using bedrails?: A Little Help  needed moving to and from a bed to a chair (including a wheelchair)?: A Little Help needed standing up from a chair using your arms (e.g., wheelchair or bedside chair)?: A Little Help needed to walk in hospital room?: A Little Help needed climbing 3-5 steps with a railing? : A Little 6 Click Score: 18    End of Session Equipment Utilized During Treatment: Gait belt Activity Tolerance: Patient tolerated treatment well Patient left: in chair;with family/visitor present;with call bell/phone within reach Nurse Communication: Mobility status PT Visit Diagnosis: Unsteadiness on feet (R26.81);Muscle weakness (generalized) (M62.81)    Time: 1120-1150 PT Time Calculation (min) (ACUTE ONLY): 30 min   Charges:   PT Evaluation $PT Eval Low Complexity: 1 Low PT Treatments $Gait Training: 8-22 mins       11/17/2018   Rande Lawman, PT   Loyal Buba 11/17/2018, 12:04 PM

## 2018-11-17 NOTE — Progress Notes (Signed)
Patient received [facility]. Patient escorted via [facility], and D/C home via private auto.

## 2018-11-17 NOTE — Progress Notes (Signed)
2D Echocardiogram has been performed.  Anna Hunt 11/17/2018, 10:31 AM

## 2018-11-17 NOTE — Evaluation (Signed)
Speech Language Pathology Evaluation Patient Details Name: Anna Hunt MRN: [id number] DOB: 1945/09/19 Today's Date: 11/17/2018 Time: 9163-8466 SLP Time Calculation (min) (ACUTE ONLY): 14 min  Problem List:  Patient Active Problem List   Diagnosis Date Noted  . TIA (transient ischemic attack) 11/16/2018  . HTN (hypertension), benign 11/16/2018  . LBBB (left bundle branch block)   . Diabetes mellitus without complication ([place])   . Stroke ([place]) 01/22/2013  . Displacement of cervical intervertebral disc without myelopathy    Past Medical History:  Past Medical History:  Diagnosis Date  . Diabetes mellitus without complication ([place])   . Displacement of cervical intervertebral disc without myelopathy   . LBBB (left bundle branch block)   . Occipital stroke ([place]) - left   . Unspecified cerebral artery occlusion with cerebral infarction   . Vertigo    Past Surgical History:  Past Surgical History:  Procedure Laterality Date  . BACK SURGERY     HPI:  Anna Hunt is a 73 y.o. female with medical history significant of DM2, HTN, prior L occipital stroke. No acute neurological deficits.   Assessment / Plan / Recommendation Clinical Impression  Pt presents with functional cognitive linguistic abilities including appropriate expressive and receptive language abilities. No acute deficits identified by pt or nursing. ST to sign off at this time.     SLP Assessment  SLP Recommendation/Assessment: Patient does not need any further Speech Lanaguage Pathology Services SLP Visit Diagnosis: Cognitive communication deficit (R41.841)    Follow Up Recommendations  None          SLP Evaluation Cognition  Overall Cognitive Status: Within Functional Limits for tasks assessed Arousal/Alertness: Awake/alert Orientation Level: Oriented X4       Comprehension  Auditory Comprehension Overall Auditory Comprehension: Appears within functional limits for tasks assessed Visual  Recognition/Discrimination Discrimination: Within Function Limits Reading Comprehension Reading Status: Within funtional limits    Expression Expression Primary Mode of Expression: Verbal Verbal Expression Overall Verbal Expression: Appears within functional limits for tasks assessed Written Expression Dominant Hand: Right Written Expression: Not tested   Oral / Motor  Oral Motor/Sensory Function Overall Oral Motor/Sensory Function: Within functional limits Motor Speech Overall Motor Speech: Appears within functional limits for tasks assessed Respiration: Within functional limits Phonation: Normal Resonance: Within functional limits Articulation: Within functional limitis Intelligibility: Intelligible Motor Planning: Witnin functional limits Motor Speech Errors: Not applicable   GO                    Laurens Matheny 11/17/2018, 12:26 PM

## 2018-11-25 DIAGNOSIS — G459 Transient cerebral ischemic attack, unspecified: Secondary | ICD-10-CM | POA: Diagnosis not present

## 2018-11-25 DIAGNOSIS — Z09 Encounter for follow-up examination after completed treatment for conditions other than malignant neoplasm: Secondary | ICD-10-CM | POA: Diagnosis not present

## 2018-12-03 DIAGNOSIS — F429 Obsessive-compulsive disorder, unspecified: Secondary | ICD-10-CM | POA: Diagnosis not present

## 2018-12-03 DIAGNOSIS — I6930 Unspecified sequelae of cerebral infarction: Secondary | ICD-10-CM | POA: Diagnosis not present

## 2018-12-03 DIAGNOSIS — I679 Cerebrovascular disease, unspecified: Secondary | ICD-10-CM | POA: Diagnosis not present

## 2018-12-03 DIAGNOSIS — H53461 Homonymous bilateral field defects, right side: Secondary | ICD-10-CM | POA: Diagnosis not present

## 2018-12-04 ENCOUNTER — Other Ambulatory Visit: Payer: Self-pay | Admitting: Cardiology

## 2018-12-04 ENCOUNTER — Telehealth: Payer: Self-pay | Admitting: *Deleted

## 2018-12-04 DIAGNOSIS — Z8673 Personal history of transient ischemic attack (TIA), and cerebral infarction without residual deficits: Secondary | ICD-10-CM

## 2018-12-04 DIAGNOSIS — I4891 Unspecified atrial fibrillation: Secondary | ICD-10-CM

## 2018-12-04 DIAGNOSIS — I447 Left bundle-branch block, unspecified: Secondary | ICD-10-CM

## 2018-12-04 DIAGNOSIS — G459 Transient cerebral ischemic attack, unspecified: Secondary | ICD-10-CM

## 2018-12-11 NOTE — Telephone Encounter (Signed)
Patient rescheduled monitor appointment until 01/26/19.

## 2019-01-16 ENCOUNTER — Telehealth: Payer: Self-pay | Admitting: Cardiovascular Disease

## 2019-01-16 NOTE — Telephone Encounter (Signed)
New Message          Not a patient of Dr. Burt Knack but she is returning Dana's call

## 2019-01-16 NOTE — Telephone Encounter (Signed)
Returned pts call. After some investigating in pt's chart, she was actually looking for a "Anna Hunt" at [facility]. Pt thanked me for helping her figure it out

## 2019-01-19 ENCOUNTER — Institutional Professional Consult (permissible substitution): Payer: Medicare Other | Admitting: Neurology

## 2019-01-26 ENCOUNTER — Telehealth: Payer: Self-pay | Admitting: *Deleted

## 2019-01-26 ENCOUNTER — Telehealth: Payer: Self-pay | Admitting: Radiology

## 2019-01-26 NOTE — Telephone Encounter (Signed)
Please call [facility] at [phone] regarding appointment for cardiac event monitor.

## 2019-01-26 NOTE — Telephone Encounter (Signed)
New Message    Pt is returning call about monitor   Please call back

## 2019-01-26 NOTE — Telephone Encounter (Signed)
[employer] to mail a 30 day cardiac event monitor to your home.  Please review instructions upon receipt then apply monitor and call [employer] at [phone] to send baseline recording.

## 2019-01-27 NOTE — Telephone Encounter (Signed)
See telephone notes from 5-18. Shelly called patient back and monitor has been ordered.

## 2019-02-03 ENCOUNTER — Telehealth: Payer: Self-pay | Admitting: Cardiovascular Disease

## 2019-02-03 NOTE — Telephone Encounter (Signed)
New Message   Patient states she has the heart monitor that was mailed to her and she doesn't know how to put it on.  Per Darrick Penna I told the patient to call to number on the box the monitor came in and they will walk her through the process.  Patient states she wants to come in to have someone put it on her.  Please call patient back.

## 2019-02-04 NOTE — Telephone Encounter (Signed)
Tried calling patient back to explain we are unable to put monitors on in the office at the current time time due to covid-19

## 2019-02-04 NOTE — Telephone Encounter (Signed)
° ° °  Please return call to patient on 5/28 Scheduler explained to patient that due to covid19 the monitor was mailed, patient needs assistance with monitor.

## 2019-02-05 ENCOUNTER — Telehealth: Payer: Self-pay | Admitting: *Deleted

## 2019-02-05 NOTE — Telephone Encounter (Signed)
See other telephone note on 5/28 by shelly w.

## 2019-02-05 NOTE — Telephone Encounter (Signed)
Patient has received monitor from [employer].  She does not think she is capable of applying monitor to herself since she has had a stroke.  Patient instructed to hold on to monitor.  I will check to see when the earliest in office appointment is available.  I will call [employer] to inform not to discontinue monitor due to lack of baseline within 5 days.  Patient has given permission to leave message on voicemail with further information and appointment time.

## 2019-02-05 NOTE — Telephone Encounter (Signed)
Called patient to let her know she can hold on to her Cardiac event monitor until 6/18 or sooner if we can schedule her to come into office to have applied.  Her enrollment will be good for 1 month before [employer] cancels it.  I spoke with our office management to see when we will be able to schedule her to come into the office to have applied.  She was not able to give me a date, management is scheduled to have a meeting today in which this will be discussed.  Informed patient I will contact her Monday, February 09, 2019, with update.

## 2019-02-11 ENCOUNTER — Encounter: Payer: Self-pay | Admitting: *Deleted

## 2019-02-11 ENCOUNTER — Other Ambulatory Visit: Payer: Self-pay

## 2019-02-11 ENCOUNTER — Telehealth: Payer: Self-pay | Admitting: *Deleted

## 2019-02-11 ENCOUNTER — Ambulatory Visit (INDEPENDENT_AMBULATORY_CARE_PROVIDER_SITE_OTHER): Payer: Medicare Other

## 2019-02-11 DIAGNOSIS — G459 Transient cerebral ischemic attack, unspecified: Secondary | ICD-10-CM | POA: Diagnosis not present

## 2019-02-11 DIAGNOSIS — I447 Left bundle-branch block, unspecified: Secondary | ICD-10-CM

## 2019-02-11 DIAGNOSIS — Z8673 Personal history of transient ischemic attack (TIA), and cerebral infarction without residual deficits: Secondary | ICD-10-CM

## 2019-02-11 DIAGNOSIS — I4891 Unspecified atrial fibrillation: Secondary | ICD-10-CM | POA: Diagnosis not present

## 2019-02-11 NOTE — Progress Notes (Signed)
Patient ID: Anna Hunt, female   DOB: Jul 29, 1946, 73 y.o.   MRN: [id number] Patient came into office 02/11/2019 for assistance and instruction on using [employer] cardiac event monitor.

## 2019-02-11 NOTE — Telephone Encounter (Signed)
    COVID-19 Pre-Screening Questions:  . In the past 7 to 10 days have you had a cough,  shortness of breath, headache, congestion, fever (100 or greater) body aches, chills, sore throat, or sudden loss of taste or sense of smell? . Have you been around anyone with known Covid 19. . Have you been around anyone who is awaiting Covid 19 test results in the past 7 to 10 days? . Have you been around anyone who has been exposed to Covid 19, or has mentioned symptoms of Covid 19 within the past 7 to 10 days?  If you have any concerns/questions about symptoms patients report during screening (either on the phone or at threshold). Contact the provider seeing the patient or DOD for further guidance.  If neither are available contact a member of the leadership team.   Spoke with patient, she answered no to the above questions.  She is aware to arrive no sooner than 15 min prior to her appointment and wear a mask.

## 2019-03-05 DIAGNOSIS — E1169 Type 2 diabetes mellitus with other specified complication: Secondary | ICD-10-CM | POA: Diagnosis not present

## 2019-03-05 DIAGNOSIS — I739 Peripheral vascular disease, unspecified: Secondary | ICD-10-CM | POA: Diagnosis not present

## 2019-03-05 DIAGNOSIS — F429 Obsessive-compulsive disorder, unspecified: Secondary | ICD-10-CM | POA: Diagnosis not present

## 2019-03-05 DIAGNOSIS — G459 Transient cerebral ischemic attack, unspecified: Secondary | ICD-10-CM | POA: Diagnosis not present

## 2019-03-05 DIAGNOSIS — I10 Essential (primary) hypertension: Secondary | ICD-10-CM | POA: Diagnosis not present

## 2019-03-05 DIAGNOSIS — I679 Cerebrovascular disease, unspecified: Secondary | ICD-10-CM | POA: Diagnosis not present

## 2019-03-05 DIAGNOSIS — E782 Mixed hyperlipidemia: Secondary | ICD-10-CM | POA: Diagnosis not present

## 2019-03-23 ENCOUNTER — Telehealth: Payer: Self-pay | Admitting: *Deleted

## 2019-03-23 NOTE — Telephone Encounter (Signed)
-----   Message from Marcial Pacas, MD sent at 03/23/2019 12:16 PM EDT ----- Please call pt for normal cardiac monitoring, sinus rhythm.

## 2019-03-23 NOTE — Telephone Encounter (Signed)
Left message informing patient of the normal results below.

## 2019-03-30 DIAGNOSIS — E782 Mixed hyperlipidemia: Secondary | ICD-10-CM | POA: Diagnosis not present

## 2019-03-30 DIAGNOSIS — E1169 Type 2 diabetes mellitus with other specified complication: Secondary | ICD-10-CM | POA: Diagnosis not present

## 2019-03-30 DIAGNOSIS — I10 Essential (primary) hypertension: Secondary | ICD-10-CM | POA: Diagnosis not present

## 2019-04-21 DIAGNOSIS — E119 Type 2 diabetes mellitus without complications: Secondary | ICD-10-CM | POA: Diagnosis not present

## 2019-04-21 DIAGNOSIS — H26493 Other secondary cataract, bilateral: Secondary | ICD-10-CM | POA: Diagnosis not present

## 2019-04-21 DIAGNOSIS — Z961 Presence of intraocular lens: Secondary | ICD-10-CM | POA: Diagnosis not present

## 2019-04-21 DIAGNOSIS — H53461 Homonymous bilateral field defects, right side: Secondary | ICD-10-CM | POA: Diagnosis not present

## 2019-07-10 DIAGNOSIS — Z23 Encounter for immunization: Secondary | ICD-10-CM | POA: Diagnosis not present

## 2019-09-09 DIAGNOSIS — M8580 Other specified disorders of bone density and structure, unspecified site: Secondary | ICD-10-CM | POA: Diagnosis not present

## 2019-09-09 DIAGNOSIS — E1169 Type 2 diabetes mellitus with other specified complication: Secondary | ICD-10-CM | POA: Diagnosis not present

## 2019-09-09 DIAGNOSIS — E782 Mixed hyperlipidemia: Secondary | ICD-10-CM | POA: Diagnosis not present

## 2019-09-09 DIAGNOSIS — G459 Transient cerebral ischemic attack, unspecified: Secondary | ICD-10-CM | POA: Diagnosis not present

## 2019-09-09 DIAGNOSIS — I10 Essential (primary) hypertension: Secondary | ICD-10-CM | POA: Diagnosis not present

## 2019-09-22 DIAGNOSIS — G459 Transient cerebral ischemic attack, unspecified: Secondary | ICD-10-CM | POA: Diagnosis not present

## 2019-09-22 DIAGNOSIS — M8580 Other specified disorders of bone density and structure, unspecified site: Secondary | ICD-10-CM | POA: Diagnosis not present

## 2019-09-22 DIAGNOSIS — E1169 Type 2 diabetes mellitus with other specified complication: Secondary | ICD-10-CM | POA: Diagnosis not present

## 2019-09-22 DIAGNOSIS — E782 Mixed hyperlipidemia: Secondary | ICD-10-CM | POA: Diagnosis not present

## 2019-09-22 DIAGNOSIS — I10 Essential (primary) hypertension: Secondary | ICD-10-CM | POA: Diagnosis not present

## 2019-12-01 DIAGNOSIS — G459 Transient cerebral ischemic attack, unspecified: Secondary | ICD-10-CM | POA: Diagnosis not present

## 2019-12-01 DIAGNOSIS — E782 Mixed hyperlipidemia: Secondary | ICD-10-CM | POA: Diagnosis not present

## 2019-12-01 DIAGNOSIS — M8580 Other specified disorders of bone density and structure, unspecified site: Secondary | ICD-10-CM | POA: Diagnosis not present

## 2019-12-01 DIAGNOSIS — I10 Essential (primary) hypertension: Secondary | ICD-10-CM | POA: Diagnosis not present

## 2019-12-01 DIAGNOSIS — E1169 Type 2 diabetes mellitus with other specified complication: Secondary | ICD-10-CM | POA: Diagnosis not present

## 2020-01-11 DIAGNOSIS — E782 Mixed hyperlipidemia: Secondary | ICD-10-CM | POA: Diagnosis not present

## 2020-01-11 DIAGNOSIS — E1169 Type 2 diabetes mellitus with other specified complication: Secondary | ICD-10-CM | POA: Diagnosis not present

## 2020-01-11 DIAGNOSIS — F429 Obsessive-compulsive disorder, unspecified: Secondary | ICD-10-CM | POA: Diagnosis not present

## 2020-01-11 DIAGNOSIS — I10 Essential (primary) hypertension: Secondary | ICD-10-CM | POA: Diagnosis not present

## 2020-01-11 DIAGNOSIS — I6930 Unspecified sequelae of cerebral infarction: Secondary | ICD-10-CM | POA: Diagnosis not present

## 2020-01-15 DIAGNOSIS — I10 Essential (primary) hypertension: Secondary | ICD-10-CM | POA: Diagnosis not present

## 2020-01-15 DIAGNOSIS — E1169 Type 2 diabetes mellitus with other specified complication: Secondary | ICD-10-CM | POA: Diagnosis not present

## 2020-01-15 DIAGNOSIS — E782 Mixed hyperlipidemia: Secondary | ICD-10-CM | POA: Diagnosis not present

## 2020-02-05 DIAGNOSIS — E1169 Type 2 diabetes mellitus with other specified complication: Secondary | ICD-10-CM | POA: Diagnosis not present

## 2020-02-05 DIAGNOSIS — E782 Mixed hyperlipidemia: Secondary | ICD-10-CM | POA: Diagnosis not present

## 2020-02-05 DIAGNOSIS — I10 Essential (primary) hypertension: Secondary | ICD-10-CM | POA: Diagnosis not present

## 2020-02-05 DIAGNOSIS — M8580 Other specified disorders of bone density and structure, unspecified site: Secondary | ICD-10-CM | POA: Diagnosis not present

## 2020-02-05 DIAGNOSIS — G459 Transient cerebral ischemic attack, unspecified: Secondary | ICD-10-CM | POA: Diagnosis not present

## 2020-03-28 DIAGNOSIS — M810 Age-related osteoporosis without current pathological fracture: Secondary | ICD-10-CM | POA: Diagnosis not present

## 2020-03-28 DIAGNOSIS — M85851 Other specified disorders of bone density and structure, right thigh: Secondary | ICD-10-CM | POA: Diagnosis not present

## 2020-03-28 DIAGNOSIS — M85852 Other specified disorders of bone density and structure, left thigh: Secondary | ICD-10-CM | POA: Diagnosis not present

## 2020-03-28 DIAGNOSIS — Z1231 Encounter for screening mammogram for malignant neoplasm of breast: Secondary | ICD-10-CM | POA: Diagnosis not present

## 2020-05-09 DIAGNOSIS — E782 Mixed hyperlipidemia: Secondary | ICD-10-CM | POA: Diagnosis not present

## 2020-05-09 DIAGNOSIS — E1169 Type 2 diabetes mellitus with other specified complication: Secondary | ICD-10-CM | POA: Diagnosis not present

## 2020-05-09 DIAGNOSIS — I10 Essential (primary) hypertension: Secondary | ICD-10-CM | POA: Diagnosis not present

## 2020-05-09 DIAGNOSIS — M810 Age-related osteoporosis without current pathological fracture: Secondary | ICD-10-CM | POA: Diagnosis not present

## 2020-05-09 DIAGNOSIS — G459 Transient cerebral ischemic attack, unspecified: Secondary | ICD-10-CM | POA: Diagnosis not present

## 2020-05-10 DIAGNOSIS — N6322 Unspecified lump in the left breast, upper inner quadrant: Secondary | ICD-10-CM | POA: Diagnosis not present

## 2020-05-10 DIAGNOSIS — R922 Inconclusive mammogram: Secondary | ICD-10-CM | POA: Diagnosis not present

## 2020-06-06 DIAGNOSIS — N6322 Unspecified lump in the left breast, upper inner quadrant: Secondary | ICD-10-CM | POA: Diagnosis not present

## 2020-06-06 DIAGNOSIS — Z170 Estrogen receptor positive status [ER+]: Secondary | ICD-10-CM | POA: Diagnosis not present

## 2020-06-06 DIAGNOSIS — C50212 Malignant neoplasm of upper-inner quadrant of left female breast: Secondary | ICD-10-CM | POA: Diagnosis not present

## 2020-06-09 ENCOUNTER — Encounter: Payer: Self-pay | Admitting: *Deleted

## 2020-06-09 DIAGNOSIS — H538 Other visual disturbances: Secondary | ICD-10-CM | POA: Diagnosis not present

## 2020-06-09 DIAGNOSIS — Z23 Encounter for immunization: Secondary | ICD-10-CM | POA: Diagnosis not present

## 2020-06-09 DIAGNOSIS — F429 Obsessive-compulsive disorder, unspecified: Secondary | ICD-10-CM | POA: Diagnosis not present

## 2020-06-09 DIAGNOSIS — E1169 Type 2 diabetes mellitus with other specified complication: Secondary | ICD-10-CM | POA: Diagnosis not present

## 2020-06-10 ENCOUNTER — Other Ambulatory Visit: Payer: Self-pay | Admitting: *Deleted

## 2020-06-10 DIAGNOSIS — C50212 Malignant neoplasm of upper-inner quadrant of left female breast: Secondary | ICD-10-CM

## 2020-06-14 NOTE — Progress Notes (Signed)
[place]  [phone] [phone]     ID: Anna Hunt DOB: May 29, 1946  MR#: [id number]  BRA#:[id number]  Patient Care Team: Lawerance Cruel, MD as PCP - General (Family Medicine) Rockwell Germany, RN as Oncology Nurse Navigator Mauro Kaufmann, RN as Oncology Nurse Navigator Rolm Bookbinder, MD as Consulting Physician (General Surgery) Bee Marchiano, Virgie Dad, MD as Consulting Physician (Oncology) Kyung Rudd, MD as Consulting Physician (Radiation Oncology) Chauncey Cruel, MD OTHER MD:  CHIEF COMPLAINT: Estrogen receptor positive breast cancer  CURRENT TREATMENT: Awaiting definitive surgery   HISTORY OF CURRENT ILLNESS: "Anna Hunt" had routine screening mammography on 03/28/2020 showing a possible abnormality in the left breast. She underwent left diagnostic mammography with tomography and left breast ultrasonography at [facility] on 05/10/2020 showing: breast density category C; 2.6 cm irregular mass in left breast at 11-12 o'clock; no significant left axillary abnormalities.  Accordingly on 06/06/2020 she proceeded to biopsy of the left breast area in question. The pathology from this procedure ([id number]) showed: invasive ductal carcinoma, grade 1-2. Prognostic indicators significant for: estrogen receptor, >95% positive with strong staining intensity and progesterone receptor, 0% negative. Proliferation marker Ki67 at 10%. HER2 negative by immunohistochemistry (1+).  The patient's subsequent history is as detailed below.   INTERVAL HISTORY: Sharonlee was evaluated in the multidisciplinary breast cancer clinic on 06/15/2020 accompanied by her daughter Anna Hunt. Her case was also presented at the multidisciplinary breast cancer conference on the same day. At that time a preliminary plan was proposed: Lumpectomy with sentinel lymph node sampling, adjuvant radiation, antiestrogen, and genetics testing  REVIEW OF SYSTEMS: There were no specific symptoms leading  to the original mammogram, which was routinely scheduled. On the provided questionnaire, Anna Hunt reports feet swelling, history of skin cancer, and diabetes. The patient denies unusual headaches, visual changes, nausea, vomiting, stiff neck, dizziness, or gait imbalance. There has been no cough, phlegm production, or pleurisy, no chest pain or pressure, and no change in bowel or bladder habits. The patient denies fever, rash, bleeding, unexplained fatigue or unexplained weight loss. A detailed review of systems was otherwise entirely negative.   PAST MEDICAL HISTORY: Past Medical History:  Diagnosis Date  . Diabetes mellitus without complication ([place])   . Displacement of cervical intervertebral disc without myelopathy   . Hypertension   . LBBB (left bundle branch block)   . Occipital stroke ([place]) - left   . Unspecified cerebral artery occlusion with cerebral infarction   . Vertigo     PAST SURGICAL HISTORY: Past Surgical History:  Procedure Laterality Date  . BACK SURGERY      FAMILY HISTORY: Family History  Problem Relation Age of Onset  . Heart attack Sister 36  . Breast cancer Sister 64  . Breast cancer Sister 49   Her father died at age 42 from a car accident and her mother at age 23 from a heart attack. Anna Hunt has 2 brothers and 3 sisters. She lost one sister at age 98 to a heart attack. As far as cancer, she reports breast cancer in two of her sisters and skin cancer in her brother.   GYNECOLOGIC HISTORY:  No LMP recorded. Patient is postmenopausal. Menarche: 74 years old Age at first live birth: 74 years old [place] P 2 LMP age 70 Contraceptive: used for 3-4 years HRT unsure  Hysterectomy? no BSO? no   SOCIAL HISTORY: (updated 06/2020)  Anna Hunt retired from working for [employer]. She is divorced. She lives at home by herself (  with no pets). Daughter Anna Hunt, age 57, works for as a Engineer, site here in [place]. Daughter Anna Hunt, age 22, is a Educational psychologist in  [place]. Anna Hunt has one grandchild. She is not a Ambulance person.    ADVANCED DIRECTIVES: not in place, would likely name daughter Anna Hunt as her HCPOA.   HEALTH MAINTENANCE: Social History   Tobacco Use  . Smoking status: Never Smoker  . Smokeless tobacco: Never Used  Substance Use Topics  . Alcohol use: No  . Drug use: No     Colonoscopy: date unsure  PAP: date unsure  Bone density: date unsure   Allergies  Allergen Reactions  . Niaspan [Niacin Er] Hives and Swelling    welps    Current Outpatient Medications  Medication Sig Dispense Refill  . ALPRAZolam (XANAX) 0.5 MG tablet Take 0.5 mg by mouth at bedtime.     . BD PEN NEEDLE NANO U/F 32G X 4 MM MISC     . BENICAR 20 MG tablet Take 20 mg by mouth every morning.     Marland Kitchen BYETTA 10 MCG PEN 10 MCG/0.04ML SOPN Inject 10 mcg into the skin 2 (two) times daily.     . Cholecalciferol (VITAMIN D) 2000 UNITS CAPS Take 2,000 Units by mouth every morning.     . clopidogrel (PLAVIX) 75 MG tablet Take 1 tablet (75 mg total) by mouth daily. 30 tablet 0  . metFORMIN (GLUCOPHAGE-XR) 500 MG 24 hr tablet Take 1,000 mg by mouth at bedtime.     . pantoprazole (PROTONIX) 40 MG tablet Take 1 tablet (40 mg total) by mouth daily. 30 tablet 0  . PARoxetine (PAXIL) 20 MG tablet Take 20 mg by mouth at bedtime.     . simvastatin (ZOCOR) 40 MG tablet Take 40 mg by mouth at bedtime.     Marland Kitchen aspirin 81 MG tablet Take 1 tablet (81 mg total) by mouth every morning. You will take this aspirin along with Plavix for 3 weeks after that stop aspirin and continue Plavix. 30 tablet 0   No current facility-administered medications for this visit.    OBJECTIVE: White woman who appears older than stated age  74:   06/15/20 1245  BP: (!) 162/64  Pulse: 78  Resp: 18  Temp: (!) 97.1 F (36.2 C)  SpO2: 98%     Body mass index is 29.84 kg/m.   Wt Readings from Last 3 Encounters:  06/15/20 165 lb 12.8 oz (75.2 kg)  11/16/18 164 lb 3.2 oz (74.5 kg)    01/22/13 186 lb (84.4 kg)      ECOG FS:1 - Symptomatic but completely ambulatory  Ocular: Sclerae unicteric, pupils round and equal Ear-nose-throat: Wearing a mask Lymphatic: No cervical or supraclavicular adenopathy Lungs no rales or rhonchi Heart regular rate and rhythm Abd soft, nontender, positive bowel sounds MSK no focal spinal tenderness, no joint edema Neuro: non-focal, well-oriented, appropriate affect Breasts: The right breast is unremarkable.  The left breast is status post recent biopsy.  There is no palpable mass.  Both axillae are benign.   LAB RESULTS:  CMP     Component Value Date/Time   NA 141 06/15/2020 1223   K 4.0 06/15/2020 1223   CL 108 06/15/2020 1223   CO2 26 06/15/2020 1223   GLUCOSE 125 (H) 06/15/2020 1223   BUN 16 06/15/2020 1223   CREATININE 0.93 06/15/2020 1223   CALCIUM 9.9 06/15/2020 1223   PROT 7.5 06/15/2020 1223   ALBUMIN 4.2 06/15/2020 1223   AST  24 06/15/2020 1223   ALT 20 06/15/2020 1223   ALKPHOS 56 06/15/2020 1223   BILITOT 0.8 06/15/2020 1223   GFRNONAA >60 06/15/2020 1223   GFRAA >60 11/16/2018 1256    No results found for: TOTALPROTELP, ALBUMINELP, A1GS, A2GS, BETS, BETA2SER, GAMS, MSPIKE, SPEI  Lab Results  Component Value Date   WBC 6.7 06/15/2020   NEUTROABS 4.5 06/15/2020   HGB 12.3 06/15/2020   HCT 36.4 06/15/2020   MCV 90.3 06/15/2020   PLT 271 06/15/2020    No results found for: LABCA2  No components found for: XQJJHE174  No results for input(s): INR in the last 168 hours.  No results found for: LABCA2  No results found for: YCX448  No results found for: JEH631  No results found for: SHF026  No results found for: CA2729  No components found for: HGQUANT  No results found for: CEA1 / No results found for: CEA1   No results found for: AFPTUMOR  No results found for: CHROMOGRNA  No results found for: KPAFRELGTCHN, LAMBDASER, KAPLAMBRATIO (kappa/lambda light chains)  No results found for:  HGBA, HGBA2QUANT, HGBFQUANT, HGBSQUAN (Hemoglobinopathy evaluation)   No results found for: LDH  No results found for: IRON, TIBC, IRONPCTSAT (Iron and TIBC)  No results found for: FERRITIN  Urinalysis    Component Value Date/Time   COLORURINE YELLOW 11/16/2018 1526   APPEARANCEUR HAZY (A) 11/16/2018 1526   LABSPEC <1.005 (L) 11/16/2018 1526   PHURINE 6.0 11/16/2018 1526   GLUCOSEU NEGATIVE 11/16/2018 [place] 11/16/2018 [place] 11/16/2018 [place] 11/16/2018 1526   PROTEINUR NEGATIVE 11/16/2018 1526   UROBILINOGEN 1.0 06/01/2007 1926   NITRITE NEGATIVE 11/16/2018 1526   LEUKOCYTESUR SMALL (A) 11/16/2018 1526     STUDIES: No results found.   ELIGIBLE FOR AVAILABLE RESEARCH PROTOCOL: AET  ASSESSMENT: 74 y.o. [place] woman status post left breast upper inner quadrant biopsy 06/06/2020 for a clinical T2 N0, stage IIA invasive ductal carcinoma, grade 1 or 2, estrogen receptor positive, progesterone receptor and HER-2 negative, with an MIB-1 of 10%  (1) genetics testing  (2) mastectomy with sentinel lymph node sampling  (3) antiestrogens  PLAN: I met today with Laporscha to review her new diagnosis. Specifically we discussed the biology of her breast cancer, its diagnosis, staging, treatment  options and prognosis. We first reviewed the fact that cancer is not one disease but more than 100 different diseases and that it is important to keep them separate-- otherwise when friends and relatives discuss their own cancer experiences with Estefania confusion can result. Similarly we explained that if breast cancer spreads to the bone or liver, the patient would not have bone cancer or liver cancer, but breast cancer in the bone and breast cancer in the liver: one cancer in three places-- not 3 different cancers which otherwise would have to be treated in 3 different ways.  We discussed the difference between local and systemic therapy.  In terms of loco-regional treatment, lumpectomy plus radiation is equivalent to mastectomy as far as survival is concerned. For this reason, and because the cosmetic results are generally superior, we recommended breast conserving surgery.  However the patient is eager to avoid radiation, and expressed a clear preference for mastectomy which accordingly is the plan.  Next we went over the options for systemic therapy which are anti-estrogens, anti-HER-2 immunotherapy, and chemotherapy. Jennilyn does not meet criteria for anti-HER-2 immunotherapy. She is a good candidate for anti-estrogens.  The  question of chemotherapy is more complicated. Chemotherapy is most effective in rapidly growing, aggressive tumors. It is much less effective in low-grade, slow growing cancers, like Farryn 's. For that reason and given the patient's age and cognitive status we are bypassing Oncotype and will proceed directly to antiestrogens.  Merilynn does qualify for genetics testing. In patients who carry a deleterious mutation [for example in a  BRCA gene], the risk of a new breast cancer developing in the future may be sufficiently great that the patient may choose bilateral mastectomies. However if she wishes to keep her breasts in that situation it is safe to do so. That would require intensified screening, which generally means not only yearly mammography but a yearly breast MRI as well.   Earle has a good understanding of the overall plan. She agrees with it. She knows the goal of treatment in her case is cure. She will call with any problems that may develop before her next visit here.  Encounter time 65 minutes.Sarajane Jews C. Honore Wipperfurth, MD 06/15/2020 5:30 PM Medical Oncology and Hematology [facility] [place], [place] [address] Tel. [phone]    Fax. [phone]   This document serves as a record of services personally performed by Lurline Del, MD. It was created on his behalf by  Wilburn Mylar, a trained medical scribe. The creation of this record is based on the scribe's personal observations and the provider's statements to them.   I, Lurline Del MD, have reviewed the above documentation for accuracy and completeness, and I agree with the above.    *Total Encounter Time as defined by the Centers for Medicare and Medicaid Services includes, in addition to the face-to-face time of a patient visit (documented in the note above) non-face-to-face time: obtaining and reviewing outside history, ordering and reviewing medications, tests or procedures, care coordination (communications with other health care professionals or caregivers) and documentation in the medical record.

## 2020-06-15 ENCOUNTER — Ambulatory Visit: Payer: Medicare Other | Attending: General Surgery | Admitting: Physical Therapy

## 2020-06-15 ENCOUNTER — Telehealth: Payer: Self-pay | Admitting: Neurology

## 2020-06-15 ENCOUNTER — Other Ambulatory Visit: Payer: Self-pay | Admitting: General Surgery

## 2020-06-15 ENCOUNTER — Encounter: Payer: Self-pay | Admitting: *Deleted

## 2020-06-15 ENCOUNTER — Encounter: Payer: Self-pay | Admitting: Physical Therapy

## 2020-06-15 ENCOUNTER — Encounter: Payer: Self-pay | Admitting: Genetic Counselor

## 2020-06-15 ENCOUNTER — Inpatient Hospital Stay (HOSPITAL_BASED_OUTPATIENT_CLINIC_OR_DEPARTMENT_OTHER): Payer: Medicare Other | Admitting: Oncology

## 2020-06-15 ENCOUNTER — Ambulatory Visit
Admission: RE | Admit: 2020-06-15 | Discharge: 2020-06-15 | Disposition: A | Discharge status: Home / Self Care | Payer: Medicare Other | Source: Ambulatory Visit | Attending: Radiation Oncology | Admitting: Radiation Oncology

## 2020-06-15 ENCOUNTER — Encounter: Payer: Self-pay | Admitting: General Practice

## 2020-06-15 ENCOUNTER — Inpatient Hospital Stay (HOSPITAL_BASED_OUTPATIENT_CLINIC_OR_DEPARTMENT_OTHER): Payer: Medicare Other | Admitting: Genetic Counselor

## 2020-06-15 ENCOUNTER — Inpatient Hospital Stay: Payer: Medicare Other | Attending: Oncology

## 2020-06-15 ENCOUNTER — Other Ambulatory Visit: Payer: Self-pay

## 2020-06-15 VITALS — BP 162/64 | HR 78 | Temp 97.1°F | Resp 18 | Ht 62.5 in | Wt 165.8 lb

## 2020-06-15 DIAGNOSIS — Z170 Estrogen receptor positive status [ER+]: Secondary | ICD-10-CM | POA: Insufficient documentation

## 2020-06-15 DIAGNOSIS — Z8673 Personal history of transient ischemic attack (TIA), and cerebral infarction without residual deficits: Secondary | ICD-10-CM

## 2020-06-15 DIAGNOSIS — M7989 Other specified soft tissue disorders: Secondary | ICD-10-CM

## 2020-06-15 DIAGNOSIS — R42 Dizziness and giddiness: Secondary | ICD-10-CM | POA: Diagnosis not present

## 2020-06-15 DIAGNOSIS — C50412 Malignant neoplasm of upper-outer quadrant of left female breast: Secondary | ICD-10-CM | POA: Diagnosis not present

## 2020-06-15 DIAGNOSIS — Z8249 Family history of ischemic heart disease and other diseases of the circulatory system: Secondary | ICD-10-CM | POA: Diagnosis not present

## 2020-06-15 DIAGNOSIS — Z803 Family history of malignant neoplasm of breast: Secondary | ICD-10-CM | POA: Diagnosis not present

## 2020-06-15 DIAGNOSIS — C50212 Malignant neoplasm of upper-inner quadrant of left female breast: Secondary | ICD-10-CM

## 2020-06-15 DIAGNOSIS — Z79899 Other long term (current) drug therapy: Secondary | ICD-10-CM

## 2020-06-15 DIAGNOSIS — E119 Type 2 diabetes mellitus without complications: Secondary | ICD-10-CM

## 2020-06-15 DIAGNOSIS — R296 Repeated falls: Secondary | ICD-10-CM | POA: Insufficient documentation

## 2020-06-15 DIAGNOSIS — Z7902 Long term (current) use of antithrombotics/antiplatelets: Secondary | ICD-10-CM | POA: Diagnosis not present

## 2020-06-15 DIAGNOSIS — R410 Disorientation, unspecified: Secondary | ICD-10-CM | POA: Insufficient documentation

## 2020-06-15 DIAGNOSIS — C50812 Malignant neoplasm of overlapping sites of left female breast: Secondary | ICD-10-CM | POA: Diagnosis not present

## 2020-06-15 DIAGNOSIS — I10 Essential (primary) hypertension: Secondary | ICD-10-CM | POA: Insufficient documentation

## 2020-06-15 DIAGNOSIS — R262 Difficulty in walking, not elsewhere classified: Secondary | ICD-10-CM | POA: Insufficient documentation

## 2020-06-15 DIAGNOSIS — G459 Transient cerebral ischemic attack, unspecified: Secondary | ICD-10-CM

## 2020-06-15 DIAGNOSIS — I639 Cerebral infarction, unspecified: Secondary | ICD-10-CM

## 2020-06-15 DIAGNOSIS — R293 Abnormal posture: Secondary | ICD-10-CM | POA: Diagnosis not present

## 2020-06-15 DIAGNOSIS — Z1501 Genetic susceptibility to malignant neoplasm of breast: Secondary | ICD-10-CM | POA: Diagnosis not present

## 2020-06-15 LAB — CMP (CANCER CENTER ONLY)
ALT: 20 U/L (ref 0–44)
AST: 24 U/L (ref 15–41)
Albumin: 4.2 g/dL (ref 3.5–5.0)
Alkaline Phosphatase: 56 U/L (ref 38–126)
Anion gap: 7 (ref 5–15)
BUN: 16 mg/dL (ref 8–23)
CO2: 26 mmol/L (ref 22–32)
Calcium: 9.9 mg/dL (ref 8.9–10.3)
Chloride: 108 mmol/L (ref 98–111)
Creatinine: 0.93 mg/dL (ref 0.44–1.00)
GFR, Estimated: >60 mL/min (ref >60)
Glucose, Bld: 125 mg/dL — ABNORMAL HIGH (ref 70–99)
Potassium: 4 mmol/L (ref 3.5–5.1)
Sodium: 141 mmol/L (ref 135–145)
Total Bilirubin: 0.8 mg/dL (ref 0.3–1.2)
Total Protein: 7.5 g/dL (ref 6.5–8.1)

## 2020-06-15 LAB — CBC WITH DIFFERENTIAL (CANCER CENTER ONLY)
Abs Immature Granulocytes: 0.03 10*3/uL (ref 0.00–0.07)
Basophils Absolute: 0.1 10*3/uL (ref 0.0–0.1)
Basophils Relative: 1 %
Eosinophils Absolute: 0.1 10*3/uL (ref 0.0–0.5)
Eosinophils Relative: 2 %
HCT: 36.4 % (ref 36.0–46.0)
Hemoglobin: 12.3 g/dL (ref 12.0–15.0)
Immature Granulocytes: 0 %
Lymphocytes Relative: 22 %
Lymphs Abs: 1.4 10*3/uL (ref 0.7–4.0)
MCH: 30.5 pg (ref 26.0–34.0)
MCHC: 33.8 g/dL (ref 30.0–36.0)
MCV: 90.3 fL (ref 80.0–100.0)
Monocytes Absolute: 0.5 10*3/uL (ref 0.1–1.0)
Monocytes Relative: 7 %
Neutro Abs: 4.5 10*3/uL (ref 1.7–7.7)
Neutrophils Relative %: 68 %
Platelet Count: 271 10*3/uL (ref 150–400)
RBC: 4.03 MIL/uL (ref 3.87–5.11)
RDW: 12.8 % (ref 11.5–15.5)
WBC Count: 6.7 10*3/uL (ref 4.0–10.5)
nRBC: 0 % (ref 0.0–0.2)

## 2020-06-15 LAB — GENETIC SCREENING ORDER

## 2020-06-15 NOTE — Telephone Encounter (Signed)
Anna Bookbinder, MD  Marcial Pacas, MD Dr Krista Blue  I think you have seen this patient before. I am seeing her for new diagnosis of breast cancer. She is on plavix due to strokes in past. She is having a lot more dizziness, falls and difficulty with balance. I dont know if anything to be done but I need to do a mastectomy on her sometime in near future. She has appt in November to see you. Is there any way to see her sooner?  Thanks, Serita Grammes    Please move up her appointment with me with in a week

## 2020-06-15 NOTE — Progress Notes (Signed)
Radiation Oncology         [phone] ________________________________  Name: Anna Hunt        MRN: [id number]  Date of Service: 06/15/2020 DOB: 1946/06/12  HE:RDEY, Anna Luo, MD  Rolm Bookbinder, MD     REFERRING PHYSICIAN: Rolm Bookbinder, MD   DIAGNOSIS: The encounter diagnosis was Malignant neoplasm of upper-inner quadrant of left breast in female, estrogen receptor positive ([place]).   HISTORY OF PRESENT ILLNESS: Anna Hunt is a 74 y.o. female seen in the multidisciplinary breast clinic for a new diagnosis of left breast cancer. The patient was noted to have a screening detected group of calcifications. Diagnostic imaging revealed a 2.2 cm group of calcifications and a mass with the 2.6 x 1.9 x 1.6 cm at 12:00. A biopsy revealed a grade 1-2 invasive ductal carcinoma that was ER positive, PR and HER2 negative with a Ki 67 of 10%. She's seen today to discuss treatment recommendations of her cancer.    PREVIOUS RADIATION THERAPY: No   PAST MEDICAL HISTORY:  Past Medical History:  Diagnosis Date  . Diabetes mellitus without complication ([place])   . Displacement of cervical intervertebral disc without myelopathy   . LBBB (left bundle branch block)   . Occipital stroke ([place]) - left   . Unspecified cerebral artery occlusion with cerebral infarction   . Vertigo        PAST SURGICAL HISTORY: Past Surgical History:  Procedure Laterality Date  . BACK SURGERY       FAMILY HISTORY:  Family History  Problem Relation Age of Onset  . Heart attack Sister 27     SOCIAL HISTORY:  reports that she has never smoked. She has never used smokeless tobacco. She reports that she does not drink alcohol and does not use drugs. The patient is divorced and lives in [place].    ALLERGIES: Niaspan [niacin er]   MEDICATIONS:  Current Outpatient Medications  Medication Sig Dispense Refill  . ALPRAZolam (XANAX) 0.5 MG tablet Take 0.5 mg by mouth at bedtime.     Marland Kitchen aspirin  81 MG tablet Take 1 tablet (81 mg total) by mouth every morning. You will take this aspirin along with Plavix for 3 weeks after that stop aspirin and continue Plavix. 30 tablet 0  . BD PEN NEEDLE NANO U/F 32G X 4 MM MISC     . BENICAR 20 MG tablet Take 20 mg by mouth every morning.     Marland Kitchen BYETTA 10 MCG PEN 10 MCG/0.04ML SOPN Inject 10 mcg into the skin 2 (two) times daily.     . Cholecalciferol (VITAMIN D) 2000 UNITS CAPS Take 2,000 Units by mouth every morning.     . clopidogrel (PLAVIX) 75 MG tablet Take 1 tablet (75 mg total) by mouth daily. 30 tablet 0  . metFORMIN (GLUCOPHAGE-XR) 500 MG 24 hr tablet Take 1,000 mg by mouth at bedtime.     . pantoprazole (PROTONIX) 40 MG tablet Take 1 tablet (40 mg total) by mouth daily. 30 tablet 0  . PARoxetine (PAXIL) 20 MG tablet Take 20 mg by mouth at bedtime.     . simvastatin (ZOCOR) 40 MG tablet Take 40 mg by mouth at bedtime.      No current facility-administered medications for this encounter.     REVIEW OF SYSTEMS: On review of systems, the patient reports that she is doing well overall. She has had episodes of feeling "swimmy headed" and dizzy and off balance. She is not having  confusion or loss of consciousness, she is not having nausea. She's awaiting evaluation with neurology. No other complaints are noted.     PHYSICAL EXAM:  Wt Readings from Last 3 Encounters:  11/16/18 164 lb 3.2 oz (74.5 kg)  01/22/13 186 lb (84.4 kg)   Temp Readings from Last 3 Encounters:  11/17/18 97.9 F (36.6 C) (Oral)   BP Readings from Last 3 Encounters:  11/17/18 (!) 152/86  01/22/13 129/82   Pulse Readings from Last 3 Encounters:  11/17/18 81  01/22/13 83    In general this is a well appearing caucasian female in no acute distress. She's alert and oriented x4 and appropriate throughout the examination. Cardiopulmonary assessment is negative for acute distress and she exhibits normal effort. Bilateral breast exam is deferred.    ECOG = 0  0 -  Asymptomatic (Fully active, able to carry on all predisease activities without restriction)  1 - Symptomatic but completely ambulatory (Restricted in physically strenuous activity but ambulatory and able to carry out work of a light or sedentary nature. For example, light housework, office work)  2 - Symptomatic, <50% in bed during the day (Ambulatory and capable of all self care but unable to carry out any work activities. Up and about more than 50% of waking hours)  3 - Symptomatic, >50% in bed, but not bedbound (Capable of only limited self-care, confined to bed or chair 50% or more of waking hours)  4 - Bedbound (Completely disabled. Cannot carry on any self-care. Totally confined to bed or chair)  5 - Death   Eustace Pen MM, Creech RH, Tormey DC, et al. [phone]). "Toxicity and response criteria of the [facility] Group". [place] Oncol. 5 (6): 649-55    LABORATORY DATA:  Lab Results  Component Value Date   WBC 9.9 11/16/2018   HGB 12.8 11/16/2018   HCT 39.4 11/16/2018   MCV 89.3 11/16/2018   PLT 250 11/16/2018   Lab Results  Component Value Date   NA 136 11/16/2018   K 3.8 11/16/2018   CL 107 11/16/2018   CO2 20 (L) 11/16/2018   Lab Results  Component Value Date   ALT 21 11/16/2018   AST 28 11/16/2018   ALKPHOS 77 11/16/2018   BILITOT 0.8 11/16/2018      RADIOGRAPHY: No results found.     IMPRESSION/PLAN: 1. Stage IIA, cT2N0M0, grade 2, ER positive invasive ductal carcinoma of the left breast. Dr. Lisbeth Hunt discusses the pathology findings and reviews the nature of left breast disease. The consensus from the breast conference includes breast conservation with lumpectomy with  sentinel node biopsy. Depending on the size of the final tumor measurements rendered by pathology, the tumor may be tested for Oncotype Dx score to determine a role for systemic therapy. Provided that chemotherapy is not indicated, the patient's course would then be followed by external  radiotherapy to the breast followed by antiestrogen therapy. We discussed the risks, benefits, short, and long term effects of radiotherapy, and the patient is interested in proceeding. Dr. Lisbeth Hunt discusses the delivery and logistics of radiotherapy and anticipates a course of 4 or 6 1/2 weeks of radiotherapy. We will see her back a few weeks after surgery to discuss the simulation process and anticipate we starting radiotherapy about 4-6 weeks after surgery.  2. Possible genetic predisposition to malignancy. The patient is a candidate for genetic testing given her personal and family history. She was offered referral and is interested in meeting today. 3. Dizziness  with history of CVA. The patient was encouraged to proceed with meeting neurology as this appears to be a slow process, rather than an acute issue, and we have shared her concerns with Dr. Donne Hunt as well.    In a visit lasting 60 minutes, greater than 50% of the time was spent face to face reviewing her case, as well as in preparation of, discussing, and coordinating the patient's care.  The above documentation reflects my direct findings during this shared patient visit. Please see the separate note by Dr. Lisbeth Hunt on this date for the remainder of the patient's plan of care.    Anna Hunt, PAC

## 2020-06-15 NOTE — Patient Instructions (Signed)

## 2020-06-15 NOTE — Progress Notes (Signed)
REFERRING PROVIDER: Chauncey Cruel, MD [address] [place],  [place] [address]  PRIMARY PROVIDER:  Lawerance Cruel, MD  PRIMARY REASON FOR VISIT:  1. Malignant neoplasm of upper-inner quadrant of left breast in female, estrogen receptor positive ([place])   2. Family history of breast cancer    I connected with Ms. Snee on 06/15/2020 at 3:30pm EDT by Webex video conference and verified that I am speaking with the correct person using two identifiers.   Patient location: [facility] Provider location: [place] OF PRESENT ILLNESS:   Ms. Yohe, a 74 y.o. female, was seen for a [place] cancer genetics consultation during the breast multidisciplinary clinic at the request of Dr. Jana Hakim due to a personal and family history of breast cancer.  Ms. Bruster presents to clinic today with her daughter, Caryl Asp, to discuss the possibility of a hereditary predisposition to cancer, genetic testing, and to further clarify her future cancer risks, as well as potential cancer risks for family members.   In 2021, at the age of 53, Ms. 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  Hormone receptor status was ER+/PR-/HER2-.  The preliminary treatment plan includes left lumpectomy with sentinel node biopsy, consideration for Oncotype Dx to determine role for systemic therapy, and possible adjuvant radiation.  Ms. Sakuma also reported a history of basal cell carcinoma.   RISK FACTORS:  Menarche was at age 84.  First live birth at age 56.  OCP use for approximately 3-4 years.  Ovaries intact: yes.  Hysterectomy: no.  Menopausal status: postmenopausal.  HRT use: unsure  Colonoscopy: most recent in 2012 Mammogram within the last year: yes. Up to date with pelvic exams: no information regarding pelvic exams provided.  Past Medical History:  Diagnosis Date   Diabetes mellitus without complication ([place])    Displacement of cervical intervertebral  disc without myelopathy    Hypertension    LBBB (left bundle branch block)    Occipital stroke ([facility]) - left    Unspecified cerebral artery occlusion with cerebral infarction    Vertigo     Past Surgical History:  Procedure Laterality Date   BACK SURGERY      Social History   Socioeconomic History   Marital status: Divorced    Spouse name: Not on file   Number of children: Not on file   Years of education: Not on file   Highest education level: Not on file  Occupational History   Not on file  Tobacco Use   Smoking status: Never Smoker   Smokeless tobacco: Never Used  Substance and Sexual Activity   Alcohol use: No   Drug use: No   Sexual activity: Not on file  Other Topics Concern   Not on file  Social History Narrative   Patient is disabled. Patient is independent in ADL's   Social Determinants of Health   Financial Resource Strain:    Difficulty of Paying Living Expenses: Not on file  Food Insecurity:    Worried About [place] in the Last Year: Not on file   Ran Out of Food in the Last Year: Not on file  Transportation Needs:    Lack of Transportation (Medical): Not on file   Lack of Transportation (Non-Medical): Not on file  Physical Activity:    Days of Exercise per Week: Not on file   Minutes of Exercise per Session: Not on file  Stress:    Feeling of Stress :  Not on file  Social Connections:    Frequency of Communication with Friends and Family: Not on file   Frequency of Social Gatherings with Friends and Family: Not on file   Attends Religious Services: Not on file   Active Member of Clubs or Organizations: Not on file   Attends Archivist Meetings: Not on file   Marital Status: Not on file     FAMILY HISTORY:  We obtained a detailed, 4-generation family history.  Significant diagnoses are listed below: Family History  Problem Relation Age of Onset   Heart attack Sister 3   Breast cancer  Sister 44   Breast cancer Sister 83    Ms. Cullom also two daughters, age 77 and 45.  Both had a history of pre-cancerous cervical changes.  Ms. Kump has three sisters and two brothers.  Two sisters were diagnosed with breast cancer at ages 17 and 62.  Ms. Vanalstine mother passed away at age 60 and did not have cancer.  Ms. Dattilo father passed away at age 74 and did not have cancer.  No other family history of cancer was reported.   Ms. Mathwig is unaware of previous family history of genetic testing for hereditary cancer risks. Patient's maternal ancestors are of Native American descent, and paternal ancestors are of Native American descent. There is no reported Ashkenazi Jewish ancestry. There is no known consanguinity.  GENETIC COUNSELING ASSESSMENT: Ms. Butzin is a 74 y.o. female with a personal and family history of breast cancer which is somewhat suggestive of a hereditary cancer syndrome and predisposition to cancer given the number of close relatives with breast cancer. We, therefore, discussed and recommended the following at today's visit.   DISCUSSION: We discussed that 5 - 10% of cancer is hereditary, with most cases of hereditary breast cancer associated with mutations in BRCA1 and BRCA2.  There are other genes that can be associated with hereditary breast cancer syndromes.  Type of cancer risk and level of risk are gene-specific.  We discussed that testing is beneficial for several reasons including knowing how to follow individuals after completing their treatment, identifying whether potential treatment options would be beneficial, and understanding if other family members could be at risk for cancer and allowing them to undergo genetic testing.   We reviewed the characteristics, features and inheritance patterns of hereditary cancer syndromes. We also discussed genetic testing, including the appropriate family members to test, the process of testing, insurance coverage and  turn-around-time for results. We discussed the implications of a negative, positive and/or variant of uncertain significant result. In order to get genetic test results in a timely manner so that Ms. Little can use these genetic test results for surgical decisions, we recommended Ms. Smyers pursue genetic testing for the STAT Breast Cancer Panel.  The STAT Breast cancer panel offered by [facility] includes sequencing and rearrangement analysis for the following 9 genes:  ATM, BRCA1, BRCA2, CDH1, CHEK2, PALB2, PTEN, STK11 and TP53.  Once complete, we recommend Ms. Fawbush pursue reflex genetic testing to a more comprehensive gene panel.   Ms. Kostka  was offered a common hereditary cancer panel (48 genes) and an expanded pan-cancer panel (85 genes). Ms. Pozo was informed of the benefits and limitations of each panel, including that expanded pan-cancer panels contain several preliminary evidence genes that do not have clear management guidelines at this point in time.  We also discussed that as the number of genes included on a panel increases, the chances of variants  of uncertain significance increases.  After considering the benefits and limitations of each gene panel, Ms. Veney  elected to have a common hereditary cancer panel through [facility].  The Common Hereditary Gene Panel offered by [facility] includes sequencing and/or deletion duplication testing of the following 48 genes: APC, ATM, AXIN2, BARD1, BMPR1A, BRCA1, BRCA2, BRIP1, CDH1, CDK4, CDKN2A (p14ARF), CDKN2A (p16INK4a), CHEK2, CTNNA1, DICER1, EPCAM (Deletion/duplication testing only), GREM1 (promoter region deletion/duplication testing only), KIT, MEN1, MLH1, MSH2, MSH3, MSH6, MUTYH, NBN, NF1, NHTL1, PALB2, PDGFRA, PMS2, POLD1, POLE, PTEN, RAD50, RAD51C, RAD51D, RNF43, SDHB, SDHC, SDHD, SMAD4, SMARCA4. STK11, TP53, TSC1, TSC2, and VHL.  The following genes were evaluated for sequence changes only: SDHA and HOXB13 c.251G>A variant only.  Based on Ms.  Rebello's personal and family history of breast cancer, she meets medical criteria for genetic testing. Despite that she meets criteria, she may still have an out of pocket cost.   PLAN: After considering the risks, benefits, and limitations, Ms. Perfecto provided informed consent to pursue genetic testing and the blood sample was sent to [facility] for analysis of the STAT Breast Cancer Panel + Common Hereditary Cancers Panel. Results should be available within approximately 1-2 weeks' time, at which point they will be disclosed by telephone to Ms. Schlotterbeck, as will any additional recommendations warranted by these results. Ms. Sidell will receive a summary of her genetic counseling visit and a copy of her results once available. This information will also be available in Epic.   Lastly, we encouraged Ms. Nunes to remain in contact with cancer genetics annually so that we can continuously update the family history and inform her of any changes in cancer genetics and testing that may be of benefit for this family.   Ms. Rosen questions were answered to her satisfaction today. Our contact information was provided should additional questions or concerns arise. Thank you for the referral and allowing [place] to share in the care of your patient.   Vance Hochmuth M. Joette Catching, [place], [place] Film/video [email or web address] (P) [phone]  The patient was seen for a total of 15 minutes in face-to-face genetic counseling.  This patient was discussed with Drs. Magrinat, Lindi Adie and/or Burr Medico who agrees with the above.  _______________________________________________________________________ For Office Staff:  Number of people involved in session: 1 Was an Intern/ student involved with case: no

## 2020-06-15 NOTE — Progress Notes (Unsigned)
ONCBCN DISTRESS SCREENING 06/15/20  Screening Type Initial Screening  Distress experienced in past week (1-10)   Emotional problem type Adjusting to illness; adjusting to change in appearance; relating to God  Physical Problem type   Referral to support programs   Other    I met with Rhianon and her daughter, Caryl Asp.  They have a history of cancer in Ryen's family and her sister, who has been through treatment for breast cancer is a big support.  They have a very tight-knit family and Keishawna feels well supported.  Joy and other family members have been concerned about her dizziness and her fall 3 weeks ago.  They are more concerned about her falling in her home alone than about the cancer treatment.  Cherysh has an appointment with a neurologist on Nov 2.  She reports that her dizziness has improved, but her family remains concerned that she is not able to care for herself and may fall.  She has a med alert that her sister gave her, but she does not always wear it. Chaney very much wants to stay at home and her independence is very important to her.  Her hope is that she can move her appointment with neurology up so that she can be seen sooner.  Her family would like her to consider temporary rehab until they figure out what is happening neurologically and can assess for her safety at home.  I offered listening support and helped facilitate some conversation between [place] and Joy.  [place], [place] Pager, [phone] 5:07 PM

## 2020-06-15 NOTE — Therapy (Signed)
[place], [place], [address] Phone: [phone]   Fax:  [phone]  Physical Therapy Evaluation  Patient Details  Name: Anna Hunt MRN: [id number] Date of Birth: [birth year] Referring Provider (PT): Dr. Rolm Bookbinder   Encounter Date: 06/15/2020   PT End of Session - 06/15/20 1703    Visit Number 1    Number of Visits 2    Date for PT Re-Evaluation 08/10/20    PT Start Time 6468    PT Stop Time 1520    PT Time Calculation (min) 51 min    Activity Tolerance Patient tolerated treatment well    Behavior During Therapy [facility] for tasks assessed/performed           Past Medical History:  Diagnosis Date  . Diabetes mellitus without complication ([place])   . Displacement of cervical intervertebral disc without myelopathy   . Hypertension   . LBBB (left bundle branch block)   . Occipital stroke ([place]) - left   . Unspecified cerebral artery occlusion with cerebral infarction   . Vertigo     Past Surgical History:  Procedure Laterality Date  . BACK SURGERY      There were no vitals filed for this visit.    Subjective Assessment - 06/15/20 1607    Subjective Patient reports she is here today to be seen by her medical team for her newly diagnosed left breast cancer.    Patient is accompained by: Family member    Pertinent History Patient was diagnosed on 03/28/2020 with left grade I-II invasive ductal carcinoma breast cancer. It measures 2.6 cm and is located in the upper inner quadrant. It is ER positive, PR negative, and HER2 negative with a Ki67 of 10%. She has significant balance deficits and has had multiple falls over the past 6 months.    Patient Stated Goals Reduce lymphedema risk and learn post op shoulder ROM HEP    Currently in Pain? No/denies              [facility] PT Assessment - 06/15/20 0001      Assessment   Medical Diagnosis Left breast cancer    Referring Provider (PT) Dr.  Rolm Bookbinder    Onset Date/Surgical Date 03/28/20    Hand Dominance Right    Prior Therapy none      Precautions   Precautions Other (comment)    Precaution Comments active cancer      Restrictions   Weight Bearing Restrictions No      Balance Screen   Has the patient fallen in the past 6 months No    Has the patient had a decrease in activity level because of a fear of falling?  No    Is the patient reluctant to leave their home because of a fear of falling?  No      Home Environment   Living Environment Private residence    Living Arrangements Alone    Available Help at Discharge Family      Prior Function   Level of [place] Retired    Leisure She does not exercise      Cognition   Overall Cognitive Status Within Functional Limits for tasks assessed      Coordination   Gross Motor Movements are Fluid and Coordinated No    Coordination and Movement Description Rapid alternating movements of hands and feet are slow and uncoordinated    Finger Nose Finger Test  Uncoordinated and slow but able to complete      Posture/Postural Control   Posture/Postural Control Postural limitations    Postural Limitations Rounded Shoulders;Forward head;Increased thoracic kyphosis      ROM / Strength   AROM / PROM / Strength AROM;Strength      AROM   Overall AROM  Due to pain    Overall AROM Comments Cervical AROM is WNL; shoulder functionally limited    AROM Assessment Site Shoulder    Right/Left Shoulder Left;Right    Right Shoulder Extension 52 Degrees    Right Shoulder Flexion 110 Degrees    Right Shoulder ABduction 135 Degrees    Right Shoulder Internal Rotation 51 Degrees    Right Shoulder External Rotation 71 Degrees    Left Shoulder Extension 46 Degrees    Left Shoulder Flexion 120 Degrees    Left Shoulder ABduction 129 Degrees    Left Shoulder Internal Rotation 47 Degrees    Left Shoulder External Rotation 76 Degrees      Ambulation/Gait    Gait Comments Shuffled gait, decreased trunk rotation, decreased dorsiflexion with ambulation. Required HHA with ambulation. Very unsteady.             LYMPHEDEMA/ONCOLOGY QUESTIONNAIRE - 06/15/20 0001      Type   Cancer Type Left breast cancer      Lymphedema Assessments   Lymphedema Assessments Upper extremities      Right Upper Extremity Lymphedema   10 cm Proximal to Olecranon Process 32.5 cm    Olecranon Process 24.6 cm    10 cm Proximal to Ulnar Styloid Process 21.8 cm    Just Proximal to Ulnar Styloid Process 16.2 cm    Across Hand at [employer] 18 cm    At [place] of 2nd Digit 6.1 cm      Left Upper Extremity Lymphedema   10 cm Proximal to Olecranon Process 30.8 cm    Olecranon Process 25.7 cm    10 cm Proximal to Ulnar Styloid Process 20.9 cm    Just Proximal to Ulnar Styloid Process 16 cm    Across Hand at [employer] 17 cm    At [place] of 2nd Digit 5.8 cm           L-DEX FLOWSHEETS - 06/15/20 1600      L-DEX LYMPHEDEMA SCREENING   Measurement Type Unilateral    L-DEX MEASUREMENT EXTREMITY Upper Extremity    POSITION  Standing    DOMINANT SIDE Right    At Risk Side Left    BASELINE SCORE (UNILATERAL) 1.3           The patient was assessed using the L-Dex machine today to produce a lymphedema index baseline score. The patient will be reassessed on a regular basis (typically every 3 months) to obtain new L-Dex scores. If the score is > 6.5 points away from his/her baseline score indicating onset of subclinical lymphedema, it will be recommended to wear a compression garment for 4 weeks, 12 hours per day and then be reassessed. If the score continues to be > 6.5 points from baseline at reassessment, we will initiate lymphedema treatment. Assessing in this manner has a 95% rate of preventing clinically significant lymphedema.      Katina Dung - 06/15/20 0001    Open a tight or new jar No difficulty    Do heavy household chores (wash walls, wash  floors) Mild difficulty    Carry a shopping bag or briefcase Moderate difficulty  Wash your back Mild difficulty    Use a knife to cut food Mild difficulty    Recreational activities in which you take some force or impact through your arm, shoulder, or hand (golf, hammering, tennis) Moderate difficulty    During the past week, to what extent has your arm, shoulder or hand problem interfered with your normal social activities with family, friends, neighbors, or groups? Modererately    During the past week, to what extent has your arm, shoulder or hand problem limited your work or other regular daily activities Slightly    Arm, shoulder, or hand pain. Mild    Tingling (pins and needles) in your arm, shoulder, or hand Moderate    Difficulty Sleeping No difficulty    DASH Score 29.55 %            Objective measurements completed on examination: See above findings.         Patient was instructed today in a home exercise program today for post op shoulder range of motion. These included active assist shoulder flexion in sitting, scapular retraction, wall walking with shoulder abduction, and hands behind head external rotation.  She was encouraged to do these twice a day, holding 3 seconds and repeating 5 times when permitted by her physician.          PT Education - 06/15/20 1702    Education Details Lymphedema risk reduction and post op shoulder ROM HEP    Person(s) Educated Patient;Child(ren)    Methods Explanation;Demonstration;Handout    Comprehension Verbalized understanding;Returned demonstration               PT Long Term Goals - 06/15/20 1719      PT LONG TERM GOAL #1   Title Patient will demonstrate she has regained full shoulder ROM and function post operatively compared to baselines.    Time 8    Period Weeks    Status New    Target Date 08/10/20           Breast Clinic Goals - 06/15/20 1718      Patient will be able to verbalize understanding of  pertinent lymphedema risk reduction practices relevant to her diagnosis specifically related to skin care.   Time 1    Status Achieved      Patient will be able to return demonstrate and/or verbalize understanding of the post-op home exercise program related to regaining shoulder range of motion.   Time 1    Period Days    Status Achieved      Patient will be able to verbalize understanding of the importance of attending the postoperative After Breast Cancer Class for further lymphedema risk reduction education and therapeutic exercise.   Time 1    Period Days    Status Achieved                 Plan - 06/15/20 1704    Clinical Impression Statement Patient was diagnosed on 03/28/2020 with left grade I-II invasive ductal carcinoma breast cancer. It measures 2.6 cm and is located in the upper inner quadrant. It is ER positive, PR negative, and HER2 negative with a Ki67 of 10%. She has significant balance deficits and has had multiple falls over the past 6 months. Her multidisciplinary medical team met prior to her assessments to determine a recommended treatment plan. She is planning to have a left mastectomy and sentinel node biopsy followed by radiation and anti-estrogen therapy. She will benefit from a post op PT  reassessment to determine needs and from L-Dex screens every 3 months to detect subclinical lymphedema. She was issued a prescription today from Dr. Jana Hakim to obtain a rolling [place] for improved stability with ambulation. Her daugher who was present planned to take her to get it. She will be assessed by PT and OT while in the hospital for her mastectomy surgery and may benefit from outpatient PT after surgery. She is scheduled to see a neurologist as her balance deficits appear to be worsening.    Personal Factors and Comorbidities Social Background;Comorbidity 1;Comorbidity 2    Comorbidities Hx stroke / TIAs; hx multiple falls due to balance deficits    Examination-Activity  Limitations Locomotion Level;Stand   Difficulty standing without supervision or hand held assist   Examination-Participation Restrictions Community Activity;Driving    Stability/Clinical Decision Making Evolving/Moderate complexity    Clinical Decision Making Moderate    Rehab Potential Good    PT Frequency --   Eval and 1 f/u visit   PT Treatment/Interventions ADLs/Self Care Home Management;DME Instruction;Therapeutic exercise;Patient/family education    PT Next Visit Plan Will reassess 3-4 weeks post op    PT Home Exercise Plan Post op shoulder ROM HEP    Consulted and Agree with Plan of Care Patient;Family member/caregiver    Family Member Consulted daughter           Patient will benefit from skilled therapeutic intervention in order to improve the following deficits and impairments:  Abnormal gait, Postural dysfunction, Decreased safety awareness, Decreased range of motion, Difficulty walking, Decreased mobility, Decreased balance, Pain, Decreased knowledge of precautions  Visit Diagnosis: Carcinoma of upper-inner quadrant of left breast in female, estrogen receptor positive ([place]) - Plan: PT plan of care cert/re-cert  Abnormal posture - Plan: PT plan of care cert/re-cert  Difficulty in walking, not elsewhere classified - Plan: PT plan of care cert/re-cert  Repeated falls - Plan: PT plan of care cert/re-cert   Patient will follow up at outpatient cancer rehab 3-4 weeks following surgery.  If the patient requires physical therapy at that time, a specific plan will be dictated and sent to the referring physician for approval. The patient was educated today on appropriate basic range of motion exercises to begin post operatively and the importance of attending the After Breast Cancer class following surgery.  Patient was educated today on lymphedema risk reduction practices as it pertains to recommendations that will benefit the patient immediately following surgery.  She verbalized good  understanding.      Problem List Patient Active Problem List   Diagnosis Date Noted  . Malignant neoplasm of upper-inner quadrant of left breast in female, estrogen receptor positive ([place]) 06/10/2020  . TIA (transient ischemic attack) 11/16/2018  . HTN (hypertension), benign 11/16/2018  . LBBB (left bundle branch block)   . Diabetes mellitus without complication ([place])   . Stroke ([place]) 01/22/2013  . Displacement of cervical intervertebral disc without myelopathy    Annia Friendly, PT 06/15/20 5:22 PM  [place], [place], [address] Phone: [phone]   Fax:  [phone]  Name: Anna Hunt MRN: [id number] Date of Birth: Apr 01, 1946

## 2020-06-16 ENCOUNTER — Ambulatory Visit (INDEPENDENT_AMBULATORY_CARE_PROVIDER_SITE_OTHER): Payer: Medicare Other | Admitting: Neurology

## 2020-06-16 ENCOUNTER — Telehealth: Payer: Self-pay | Admitting: Neurology

## 2020-06-16 ENCOUNTER — Encounter: Payer: Self-pay | Admitting: Neurology

## 2020-06-16 VITALS — BP 143/80 | HR 79 | Ht 62.0 in | Wt 167.0 lb

## 2020-06-16 DIAGNOSIS — I639 Cerebral infarction, unspecified: Secondary | ICD-10-CM

## 2020-06-16 DIAGNOSIS — R269 Unspecified abnormalities of gait and mobility: Secondary | ICD-10-CM | POA: Diagnosis not present

## 2020-06-16 NOTE — Telephone Encounter (Signed)
I spoke to the patient and she confirmed the appt.

## 2020-06-16 NOTE — Progress Notes (Signed)
Chief Complaint  Patient presents with   Consult    with brother, pt reports balance issues, and multiple falls     HISTORICAL  Anna Hunt is a 74 year old female, seen in request by her primary care physician Dr. Harrington Challenger, Dwyane Luo, for evaluation of multiple falls, unsteady gait, she is accompanied by her brother at today's visit June 16, 2020.  I reviewed and summarized the referring note.PMHX DM HLD Stroke  Back surgery  I saw her in 2014 for stroke, left MCA stroke in 1998 at age 66, presented with right hemianopia, stroke involving left occipital lobe, was treated with Coumadin for extended period of time  Because there was no embolic source found, later was switched to aspirin plus Plavix, then aspirin monotherapy since 2009  Personally reviewed MRI of the brain March 2020, which was taken for her complaints of dizziness, old left temporal occipital and left thalamus PCA territory stroke, moderate small vessel disease, no acute intracranial process  MRA of the brain showed intracranial atherosclerotic disease  She denies a previous history of gait abnormality, since September 2021, she noticed gradual onset gait abnormality, seems to protrude within 1 month, she fell few times, tends to bump into things, rely on [place] for longer distance now,  She also had long history of urinary urgency, frequency, denies neck pain, denies upper or lower extremity sensory changes, or other also reported mild slurred speech  She was recently diagnosed with left breast cancer, pending left lobectomy by Dr. Donne Hazel   REVIEW OF SYSTEMS: Full 14 system review of systems performed and notable only for as above All other review of systems were negative.  ALLERGIES: Allergies  Allergen Reactions   Niaspan [Niacin Er] Hives and Swelling    welps    HOME MEDICATIONS: Current Outpatient Medications  Medication Sig Dispense Refill   ALPRAZolam (XANAX) 0.5 MG tablet Take 0.5 mg  by mouth at bedtime.      aspirin 81 MG tablet Take 1 tablet (81 mg total) by mouth every morning. You will take this aspirin along with Plavix for 3 weeks after that stop aspirin and continue Plavix. 30 tablet 0   BD PEN NEEDLE NANO U/F 32G X 4 MM MISC      BENICAR 20 MG tablet Take 20 mg by mouth every morning.      BYETTA 10 MCG PEN 10 MCG/0.04ML SOPN Inject 10 mcg into the skin 2 (two) times daily.      Cholecalciferol (VITAMIN D) 2000 UNITS CAPS Take 2,000 Units by mouth every morning.      clopidogrel (PLAVIX) 75 MG tablet Take 1 tablet (75 mg total) by mouth daily. 30 tablet 0   metFORMIN (GLUCOPHAGE-XR) 500 MG 24 hr tablet Take 1,000 mg by mouth at bedtime.      pantoprazole (PROTONIX) 40 MG tablet Take 1 tablet (40 mg total) by mouth daily. 30 tablet 0   PARoxetine (PAXIL) 20 MG tablet Take 20 mg by mouth at bedtime.      simvastatin (ZOCOR) 40 MG tablet Take 40 mg by mouth at bedtime.      No current facility-administered medications for this visit.    PAST MEDICAL HISTORY: Past Medical History:  Diagnosis Date   Diabetes mellitus without complication ([place])    Displacement of cervical intervertebral disc without myelopathy    Hypertension    LBBB (left bundle branch block)    Occipital stroke ([facility]) - left    Unspecified cerebral artery occlusion with cerebral  infarction    Vertigo     PAST SURGICAL HISTORY: Past Surgical History:  Procedure Laterality Date   BACK SURGERY      FAMILY HISTORY: Family History  Problem Relation Age of Onset   Heart attack Sister 57   Breast cancer Sister 26   Breast cancer Sister 52    SOCIAL HISTORY: Social History   Socioeconomic History   Marital status: Divorced    Spouse name: Not on file   Number of children: Not on file   Years of education: Not on file   Highest education level: Not on file  Occupational History   Not on file  Tobacco Use   Smoking status: Never Smoker   Smokeless  tobacco: Never Used  Substance and Sexual Activity   Alcohol use: No   Drug use: No   Sexual activity: Not on file  Other Topics Concern   Not on file  Social History Narrative   Patient is disabled. Patient is independent in ADL's   Social Determinants of Health   Financial Resource Strain:    Difficulty of Paying Living Expenses: Not on file  Food Insecurity:    Worried About [place] in the Last Year: Not on file   Ran Out of Food in the Last Year: Not on file  Transportation Needs:    Lack of Transportation (Medical): Not on file   Lack of Transportation (Non-Medical): Not on file  Physical Activity:    Days of Exercise per Week: Not on file   Minutes of Exercise per Session: Not on file  Stress:    Feeling of Stress : Not on file  Social Connections:    Frequency of Communication with Friends and Family: Not on file   Frequency of Social Gatherings with Friends and Family: Not on file   Attends Religious Services: Not on file   Active Member of Clubs or Organizations: Not on file   Attends Archivist Meetings: Not on file   Marital Status: Not on file  Intimate Partner Violence:    Fear of Current or Ex-Partner: Not on file   Emotionally Abused: Not on file   Physically Abused: Not on file   Sexually Abused: Not on file     PHYSICAL EXAM   Vitals:   06/16/20 1452  BP: (!) 143/80  Pulse: 79  Weight: 167 lb (75.8 kg)  Height: 5\' 2"  (1.575 m)   Not recorded     Body mass index is 30.54 kg/m.  PHYSICAL EXAMNIATION:  Gen: NAD, conversant, well nourised, well groomed                     Cardiovascular: Regular rate rhythm, no peripheral edema, warm, nontender. Eyes: Conjunctivae clear without exudates or hemorrhage Neck: Supple, no carotid bruits. Pulmonary: Clear to auscultation bilaterally   NEUROLOGICAL EXAM:  MENTAL STATUS: Speech:    Speech is normal; fluent and spontaneous with normal comprehension.    Cognition:     Orientation to time, place and person     Normal recent and remote memory     Normal Attention span and concentration     Normal Language, naming, repeating,spontaneous speech     Fund of knowledge   CRANIAL NERVES: CN II: Visual fields are full to confrontation. Pupils are round equal and briskly reactive to light. CN III, IV, VI: extraocular movement are normal. No ptosis. CN V: Facial sensation is intact to light touch CN VII: Face  is symmetric with normal eye closure  CN VIII: Hearing is normal to causal conversation. CN IX, X: Phonation is normal. CN XI: Head turning and shoulder shrug are intact  MOTOR: Mild right arm drift, bilateral lower extremity spasticity, no significant muscle weakness,  REFLEXES: Reflexes are 3 and hyperreflexia on the right side at the biceps, triceps, knees, and ankles. Plantar responses are extensor bilaterally  SENSORY: Intact to light touch, pinprick and vibratory sensation are intact in fingers and toes.  COORDINATION: There is no trunk or limb dysmetria noted.  GAIT/STANCE: She needs push-up to get up from seated position, wide-based, unsteady, dragging her right leg some   DIAGNOSTIC DATA (LABS, IMAGING, TESTING) - I reviewed patient records, labs, notes, testing and imaging myself where available.   ASSESSMENT AND PLAN  ISRA LINDY is a 74 y.o. female   Worsening gait abnormality since September 2021 History of left PCA stroke Recent diagnosis of left breast cancer, pending left lobectomy  On examination, she has profound hyperreflexia, right worse than left, mild right arm and leg weakness, wide-based stiff unsteady gait  Potential localized to brain, versus cervical spine  MRI of the brain with without contrast, MRI of cervical spine  Echocardiogram  Ultrasound of carotid artery   Marcial Pacas, M.D. Ph.D.  [facility] [address] [place], [place] [address] Ph: [phone] Fax: [phone]  CC:  Lawerance Cruel, [place],  [place] [address]

## 2020-06-16 NOTE — Telephone Encounter (Signed)
I called the patient's number and her daughter answered the phone. I offered an appt for today at 3pm (check-in 2:30). She is going to try to arrange transportation. She will call back to confirm the appt. If she is unable to make it then we will look for a work-in slot next week.

## 2020-06-16 NOTE — Telephone Encounter (Signed)
Medicare/medicaid no auth. Sent message to Olin Hauser at GI on stat MRI for her to be scheduled as soon as possible.

## 2020-06-17 ENCOUNTER — Telehealth: Payer: Self-pay | Admitting: Oncology

## 2020-06-17 NOTE — Telephone Encounter (Signed)
Scheduled per 10/6 los. Called and left a msg  

## 2020-06-20 NOTE — Telephone Encounter (Signed)
The soonest GI can get the patient in there is on 06/23/20. If you put a new urgent request in for Anna Hunt I can see if they have anything sooner.

## 2020-06-20 NOTE — Telephone Encounter (Signed)
Noted, thank you

## 2020-06-20 NOTE — Telephone Encounter (Signed)
I saw that she is on schedule for Oct 13 and 15. That is good enough

## 2020-06-20 NOTE — Telephone Encounter (Signed)
Correction patient is now scheduled for Wednesday 06/22/20 at GI. If they have any cancellation for sooner appt they will get the patient in sooner.

## 2020-06-22 ENCOUNTER — Telehealth: Payer: Self-pay | Admitting: Genetic Counselor

## 2020-06-22 ENCOUNTER — Telehealth: Payer: Self-pay | Admitting: Neurology

## 2020-06-22 ENCOUNTER — Other Ambulatory Visit: Payer: Self-pay

## 2020-06-22 ENCOUNTER — Ambulatory Visit
Admission: RE | Admit: 2020-06-22 | Discharge: 2020-06-22 | Disposition: A | Discharge status: Home / Self Care | Payer: Medicare Other | Source: Ambulatory Visit | Attending: Neurology | Admitting: Neurology

## 2020-06-22 DIAGNOSIS — R269 Unspecified abnormalities of gait and mobility: Secondary | ICD-10-CM

## 2020-06-22 DIAGNOSIS — I639 Cerebral infarction, unspecified: Secondary | ICD-10-CM

## 2020-06-22 DIAGNOSIS — W01198A Fall on same level from slipping, tripping and stumbling with subsequent striking against other object, initial encounter: Secondary | ICD-10-CM | POA: Diagnosis not present

## 2020-06-22 DIAGNOSIS — S0083XA Contusion of other part of head, initial encounter: Secondary | ICD-10-CM | POA: Diagnosis not present

## 2020-06-22 MED ORDER — GADOBENATE DIMEGLUMINE 529 MG/ML IV SOLN
15.0000 mL | Freq: Once | INTRAVENOUS | Status: AC | PRN
  Administered 2020-06-22: 15 mL via INTRAVENOUS

## 2020-06-22 NOTE — Telephone Encounter (Addendum)
Revealed negative genetic testing.  Discussed that we do not know why she has breast cancer or why there is cancer in the family. It could be sporadic, due to a different gene that we are not testing, or maybe our current technology may not be able to pick something up.  It will be important for her to keep in contact with genetics to keep up with whether additional testing may be needed.

## 2020-06-22 NOTE — Telephone Encounter (Signed)
FYI-Pt's daughter Caryl Asp, on [place] called wanting to inform provider that the pt has fallen and broken her L side of her face. Pt is at GI at the moment getting her MRI that was ordered for her and then the pt will be taken to the ED.

## 2020-06-23 ENCOUNTER — Ambulatory Visit: Payer: Self-pay | Admitting: Genetic Counselor

## 2020-06-23 ENCOUNTER — Encounter: Payer: Self-pay | Admitting: Genetic Counselor

## 2020-06-23 DIAGNOSIS — Z803 Family history of malignant neoplasm of breast: Secondary | ICD-10-CM

## 2020-06-23 DIAGNOSIS — Z1379 Encounter for other screening for genetic and chromosomal anomalies: Secondary | ICD-10-CM | POA: Insufficient documentation

## 2020-06-23 DIAGNOSIS — I639 Cerebral infarction, unspecified: Secondary | ICD-10-CM | POA: Insufficient documentation

## 2020-06-23 DIAGNOSIS — C50212 Malignant neoplasm of upper-inner quadrant of left female breast: Secondary | ICD-10-CM

## 2020-06-23 MED ORDER — DIVALPROEX SODIUM ER 500 MG PO TB24: 500.0000 mg | ORAL_TABLET | Freq: Every evening | ORAL | 6 refills | Status: DC

## 2020-06-23 NOTE — Progress Notes (Signed)
HPI:  Anna Hunt was previously seen in the [facility] clinic due to a personal and family history of breast cancer and concerns regarding a hereditary predisposition to cancer. Please refer to our prior cancer genetics clinic note for more information regarding our discussion, assessment and recommendations, at the time. Anna Hunt recent genetic test results were disclosed to her, as were recommendations warranted by these results. These results and recommendations are discussed in more detail below.  CANCER HISTORY:  Oncology History  Malignant neoplasm of upper-inner quadrant of left breast in female, estrogen receptor positive ([place])  06/10/2020 Initial Diagnosis   Malignant neoplasm of upper-inner quadrant of left breast in female, estrogen receptor positive ([place])   06/22/2020 Genetic Testing   Negative genetic testing: No pathogenic variants detected in [facility] Common Hereditary Cancers Panel.  The report date is June 22, 2020.    The Common Hereditary Cancers Panel offered by [facility] includes sequencing and/or deletion duplication testing of the following 48 genes: APC, ATM, AXIN2, BARD1, BMPR1A, BRCA1, BRCA2, BRIP1, CDH1, CDK4, CDKN2A (p14ARF), CDKN2A (p16INK4a), CHEK2, CTNNA1, DICER1, EPCAM (Deletion/duplication testing only), GREM1 (promoter region deletion/duplication testing only), KIT, MEN1, MLH1, MSH2, MSH3, MSH6, MUTYH, NBN, NF1, NHTL1, PALB2, PDGFRA, PMS2, POLD1, POLE, PTEN, RAD50, RAD51C, RAD51D, RNF43, SDHB, SDHC, SDHD, SMAD4, SMARCA4. STK11, TP53, TSC1, TSC2, and VHL.  The following genes were evaluated for sequence changes only: SDHA and HOXB13 c.251G>A variant only.     FAMILY HISTORY:  We obtained a detailed, 4-generation family history.  Significant diagnoses are listed below: Family History  Problem Relation Age of Onset  . Breast cancer Sister 38  . Breast cancer Sister 54    Anna Hunt also two daughters, age 77 and 12.  Both had a history of  pre-cancerous cervical changes.  Anna Hunt has three sisters and two brothers.  Two sisters were diagnosed with breast cancer at ages 8 and 67.  Anna Hunt mother passed away at age 48 and did not have cancer.  Anna Hunt father passed away at age 65 and did not have cancer.  No other family history of cancer was reported.   Anna Hunt is unaware of previous family history of genetic testing for hereditary cancer risks. Patient's maternal ancestors are of Native American descent, and paternal ancestors are of Native American descent. There is no reported Ashkenazi Jewish ancestry. There is no known consanguinity.   GENETIC TEST RESULTS: Genetic testing reported out on June 22, 2020.  The [facility] Common Hereditary Cancers Panel found no pathogenic mutations. The Common Hereditary Cancers Panel offered by [facility] includes sequencing and/or deletion duplication testing of the following 48 genes: APC, ATM, AXIN2, BARD1, BMPR1A, BRCA1, BRCA2, BRIP1, CDH1, CDK4, CDKN2A (p14ARF), CDKN2A (p16INK4a), CHEK2, CTNNA1, DICER1, EPCAM (Deletion/duplication testing only), GREM1 (promoter region deletion/duplication testing only), KIT, MEN1, MLH1, MSH2, MSH3, MSH6, MUTYH, NBN, NF1, NHTL1, PALB2, PDGFRA, PMS2, POLD1, POLE, PTEN, RAD50, RAD51C, RAD51D, RNF43, SDHB, SDHC, SDHD, SMAD4, SMARCA4. STK11, TP53, TSC1, TSC2, and VHL.  The following genes were evaluated for sequence changes only: SDHA and HOXB13 c.251G>A variant only.   The test report has been scanned into EPIC and is located under the Molecular Pathology section of the Results Review tab.  A portion of the result report is included below for reference.     We discussed with Anna Hunt that because current genetic testing is not perfect, it is possible there may be a gene mutation in one of these genes that current testing cannot detect, but  that chance is small.  We also discussed, that there could be another gene that has not yet been discovered, or  that we have not yet tested, that is responsible for the cancer diagnoses in the family. It is also possible there is a hereditary cause for the cancer in the family that Anna Hunt did not inherit and therefore was not identified in her testing.  Therefore, it is important to remain in touch with cancer genetics in the future so that we can continue to offer Anna Hunt the most up to date genetic testing.   ADDITIONAL GENETIC TESTING: We discussed with Anna Hunt that there are other genes that are associated with increased cancer risk that can be analyzed. Should Anna Hunt wish to pursue additional genetic testing, we are happy to discuss and coordinate this testing, at any time.    CANCER SCREENING RECOMMENDATIONS: Anna Hunt test result is considered negative (normal).  This means that we have not identified a hereditary cause for her personal and family history of breast cancer at this time. Most cancers happen by chance and this negative test suggests that her cancer may fall into this category.    While reassuring, this does not definitively rule out a hereditary predisposition to cancer. It is still possible that there could be genetic mutations that are undetectable by current technology. There could be genetic mutations in genes that have not been tested or identified to increase cancer risk.  Therefore, it is recommended she continue to follow the cancer management and screening guidelines provided by her oncology and primary healthcare provider.   An individual's cancer risk and medical management are not determined by genetic test results alone. Overall cancer risk assessment incorporates additional factors, including personal medical history, family history, and any available genetic information that may result in a personalized plan for cancer prevention and surveillance.   RECOMMENDATIONS FOR FAMILY MEMBERS:  Individuals in this family might be at some increased risk of developing  cancer, over the general population risk, simply due to the family history of cancer.  We recommended women in this family have a yearly mammogram beginning at age 34, or 22 years younger than the earliest onset of cancer, an annual clinical breast exam, and perform monthly breast self-exams. Women in this family should also have a gynecological exam as recommended by their primary provider. All family members should be referred for colonoscopy starting at age 40.  FOLLOW-UP: Lastly, we discussed with Anna Hunt that cancer genetics is a rapidly advancing field and it is possible that new genetic tests will be appropriate for her and/or her family members in the future. We encouraged her to remain in contact with cancer genetics on an annual basis so we can update her personal and family histories and let her know of advances in cancer genetics that may benefit this family.   Our contact number was provided. Anna Hunt questions were answered to her satisfaction, and she knows she is welcome to call us at anytime with additional questions or concerns.   Terrance Lanahan M. Joette Catching, [place], [facility] Certified Film/video [email or web address] (P) [phone]

## 2020-06-23 NOTE — Telephone Encounter (Signed)
Pt's daughter, Hadiya Spoerl (on [place]) called, she had a fall that split her face open.  She had a brain scan yesterday. Did not receive a call back, reach out to PCP, Dr. Harrington Challenger. MRI imaging in [place] said result will take 5 to 7 days. Would like to discuss with Dr. Krista Blue, if she has had another stroke and  what to do.

## 2020-06-23 NOTE — Telephone Encounter (Signed)
I have Her daughter Caryl Asp [phone], she is going to be her POA, patient is not thinking straight.    IMPRESSION:  MRI brain with and without contrast demonstrating: -Chronic left occipital lobe ischemic infarct.  Chronic lacunar infarct of left pons. -Moderate chronic small vessel ischemic disease. -No acute findings.  Overall stable compared to CT on 11/16/2018.  Joy reported that patient has increased confusion, word finding difficulties, repeating questions, forgetting conversations, worsening gait abnormality, fell on June 21, 2020,  EEG Add on Depakote ER every night for possible partial seizure MRI cervical pending for June 24, 2020, will call her reprot

## 2020-06-23 NOTE — Telephone Encounter (Addendum)
I spoke to the patient's daughter who is calling for her mother's MRI brain results. She completed the scan yesterday. She is asking for a call back from Dr. Krista Blue. She feels her mother is sharply declining and she is uncomfortable with her current state of health. Increased confusion, worsening gait.   Reports the fall she had occurred on Tuesday. Her mother did not call the family to let them know she was injured. She did not push her life alert alarm. Says her face is swollen the size of "two softballs". The daughter says she was seen at urgent care but no x-ray was ordered. States she was told it was because she was having the MRI brain.   The patient has MRI cervical pending on 06/24/20.  Laverna Peace - she can be reached at her work number [phone].

## 2020-06-23 NOTE — Addendum Note (Signed)
Addended by: Marcial Pacas on: 06/23/2020 04:08 PM   Modules accepted: Orders

## 2020-06-24 ENCOUNTER — Telehealth: Payer: Self-pay | Admitting: *Deleted

## 2020-06-24 ENCOUNTER — Ambulatory Visit
Admission: RE | Admit: 2020-06-24 | Discharge: 2020-06-24 | Disposition: A | Discharge status: Home / Self Care | Payer: Medicare Other | Source: Ambulatory Visit | Attending: Neurology | Admitting: Neurology

## 2020-06-24 ENCOUNTER — Telehealth: Payer: Self-pay | Admitting: Neurology

## 2020-06-24 ENCOUNTER — Encounter: Payer: Self-pay | Admitting: *Deleted

## 2020-06-24 ENCOUNTER — Other Ambulatory Visit: Payer: Medicare Other

## 2020-06-24 DIAGNOSIS — R269 Unspecified abnormalities of gait and mobility: Secondary | ICD-10-CM

## 2020-06-24 DIAGNOSIS — G959 Disease of spinal cord, unspecified: Secondary | ICD-10-CM

## 2020-06-24 DIAGNOSIS — I639 Cerebral infarction, unspecified: Secondary | ICD-10-CM | POA: Diagnosis not present

## 2020-06-24 NOTE — Telephone Encounter (Signed)
Called and spoke with patient to follow up from [facility] last week.  Patient is easily confused so I spoke with her sister Anna Hunt who is on her HIPPA form. She states patient is getting a lot of work up right now due to dizziness and instability and falling. Surgery will have to be on hold until cleared.  Will ask Dr. Jana Hakim if he wants to put her on ai in the meantime. Encouraged her to call with any needs or concerns.

## 2020-06-24 NOTE — Telephone Encounter (Signed)
IMPRESSION: This MRI of the cervical spine without contrast shows the following: 1.   At C4-C5, there is mild spinal stenosis due to degenerative change but no nerve root compression. 2.   At C5-C6, there is moderately severe spinal stenosis due to disc protrusion and other degenerative change.  There is moderately severe right foraminal narrowing that could lead to right C6 nerve root compression. 3.   At C6-C7, there is moderately severe spinal stenosis due to disc herniation slightly to to the left and other degenerative change.  There does not appear to be nerve root compression. 4.   Milder degenerative changes are noted at the other cervical levels with no nerve root compression or spinal stenosis. 5.   The spinal cord has normal signal. 6.   Evidence of remote left occipital stroke and chronic microvascular ischemic changes within the pons  I have called her daughter Caryl Asp at 45, 77, 5200, moderately severe spinal stenosis C5-6, C6-7, this finding which likely explain her worsening gait abnormality, acute urgent referral for neurosurgeon,  Hinton Dyer, please make sure she gets appointment soon, she has history of breast cancer, breast cancer surgery is put on hold for above findings  Also make sure she will have her ECHO and US carotid artery soon.

## 2020-06-27 NOTE — Telephone Encounter (Signed)
Patient is scheduled to see Neurosurgery 06/29/2020.

## 2020-06-28 ENCOUNTER — Encounter: Payer: Self-pay | Admitting: *Deleted

## 2020-06-29 ENCOUNTER — Other Ambulatory Visit: Payer: Self-pay

## 2020-06-29 ENCOUNTER — Ambulatory Visit (HOSPITAL_COMMUNITY): Payer: Medicare Other | Attending: Cardiology

## 2020-06-29 DIAGNOSIS — I10 Essential (primary) hypertension: Secondary | ICD-10-CM | POA: Insufficient documentation

## 2020-06-29 DIAGNOSIS — I447 Left bundle-branch block, unspecified: Secondary | ICD-10-CM | POA: Insufficient documentation

## 2020-06-29 DIAGNOSIS — R269 Unspecified abnormalities of gait and mobility: Secondary | ICD-10-CM | POA: Diagnosis not present

## 2020-06-29 DIAGNOSIS — M47812 Spondylosis without myelopathy or radiculopathy, cervical region: Secondary | ICD-10-CM | POA: Diagnosis not present

## 2020-06-29 DIAGNOSIS — E119 Type 2 diabetes mellitus without complications: Secondary | ICD-10-CM | POA: Insufficient documentation

## 2020-06-29 DIAGNOSIS — I6389 Other cerebral infarction: Secondary | ICD-10-CM

## 2020-06-29 DIAGNOSIS — I639 Cerebral infarction, unspecified: Secondary | ICD-10-CM | POA: Insufficient documentation

## 2020-06-29 DIAGNOSIS — M4802 Spinal stenosis, cervical region: Secondary | ICD-10-CM | POA: Diagnosis not present

## 2020-06-29 LAB — ECHOCARDIOGRAM COMPLETE
Area-P 1/2: 4.6 cm2
S' Lateral: 2.4 cm

## 2020-06-30 ENCOUNTER — Ambulatory Visit (INDEPENDENT_AMBULATORY_CARE_PROVIDER_SITE_OTHER): Payer: Medicare Other | Admitting: Neurology

## 2020-06-30 DIAGNOSIS — I639 Cerebral infarction, unspecified: Secondary | ICD-10-CM

## 2020-06-30 DIAGNOSIS — R410 Disorientation, unspecified: Secondary | ICD-10-CM

## 2020-06-30 DIAGNOSIS — R269 Unspecified abnormalities of gait and mobility: Secondary | ICD-10-CM

## 2020-06-30 NOTE — Procedures (Signed)
   HISTORY: 74 year old female, with history of stroke, presented with gait abnormality  TECHNIQUE:  This is a routine 16 channel EEG recording with one channel devoted to a limited EKG recording.  It was performed during wakefulness, drowsiness and asleep.  Hyperventilation and photic stimulation were performed as activating procedures.  There are minimum muscle and movement artifact noted.  Upon maximum arousal, posterior dominant waking rhythm consistent of mildly dysrhythmic activity in the range of 8 to 9 Hz. Activities are symmetric over the bilateral posterior derivations and attenuated with eye opening.  Hyperventilation produced mild/moderate buildup with higher amplitude and the slower activities noted.  Photic stimulation did not alter the tracing.  During EEG recording, patient developed drowsiness and no deeper stage of sleep was achieved  During EEG recording, there was no epileptiform discharge noted.  EKG demonstrate sinus rhythm, with heart rate of 60 bpm  CONCLUSION: This is a  normal awake EEG.  There is no electrodiagnostic evidence of epileptiform discharge.  Marcial Pacas, M.D. Ph.D.  [facility] [place], [place] [address] Phone: [phone] Fax:      [phone]

## 2020-06-30 NOTE — Telephone Encounter (Signed)
CONCLUSION:  This is a normal awake EEG. There is no electrodiagnostic evidence of epileptiform discharge.  I returned the call to her daughter and she verbalized understanding of the results.   The patient has a pending appt on 07/12/20 with Dr. Krista Blue to review all the test results. Her daughter will be attending this appt.

## 2020-06-30 NOTE — Telephone Encounter (Signed)
daughter Caryl Asp [phone] is asking for another call from Dr Krista Blue re: results from test(MRI & EEG).  Please call

## 2020-07-01 ENCOUNTER — Ambulatory Visit (HOSPITAL_COMMUNITY)
Admission: RE | Admit: 2020-07-01 | Discharge: 2020-07-01 | Disposition: A | Discharge status: Home / Self Care | Payer: Medicare Other | Source: Ambulatory Visit | Attending: Neurology | Admitting: Neurology

## 2020-07-01 ENCOUNTER — Other Ambulatory Visit: Payer: Self-pay

## 2020-07-01 DIAGNOSIS — I639 Cerebral infarction, unspecified: Secondary | ICD-10-CM | POA: Insufficient documentation

## 2020-07-01 DIAGNOSIS — R269 Unspecified abnormalities of gait and mobility: Secondary | ICD-10-CM | POA: Diagnosis not present

## 2020-07-01 NOTE — Progress Notes (Signed)
Carotid artery duplex completed. Refer to "CV Proc" under chart review to view preliminary results.  07/01/2020 2:25 PM Kelby Aline., MHA, RVT, RDCS, RDMS

## 2020-07-06 DIAGNOSIS — I10 Essential (primary) hypertension: Secondary | ICD-10-CM | POA: Diagnosis not present

## 2020-07-06 DIAGNOSIS — G459 Transient cerebral ischemic attack, unspecified: Secondary | ICD-10-CM | POA: Diagnosis not present

## 2020-07-06 DIAGNOSIS — E1169 Type 2 diabetes mellitus with other specified complication: Secondary | ICD-10-CM | POA: Diagnosis not present

## 2020-07-06 DIAGNOSIS — M810 Age-related osteoporosis without current pathological fracture: Secondary | ICD-10-CM | POA: Diagnosis not present

## 2020-07-06 DIAGNOSIS — E782 Mixed hyperlipidemia: Secondary | ICD-10-CM | POA: Diagnosis not present

## 2020-07-06 DIAGNOSIS — C50919 Malignant neoplasm of unspecified site of unspecified female breast: Secondary | ICD-10-CM | POA: Diagnosis not present

## 2020-07-12 ENCOUNTER — Ambulatory Visit (INDEPENDENT_AMBULATORY_CARE_PROVIDER_SITE_OTHER): Payer: Medicare Other | Admitting: Neurology

## 2020-07-12 ENCOUNTER — Encounter: Payer: Self-pay | Admitting: Neurology

## 2020-07-12 ENCOUNTER — Institutional Professional Consult (permissible substitution): Payer: Medicare Other | Admitting: Neurology

## 2020-07-12 VITALS — BP 110/70 | HR 74 | Ht 62.5 in | Wt 161.5 lb

## 2020-07-12 DIAGNOSIS — I639 Cerebral infarction, unspecified: Secondary | ICD-10-CM | POA: Diagnosis not present

## 2020-07-12 DIAGNOSIS — G959 Disease of spinal cord, unspecified: Secondary | ICD-10-CM | POA: Diagnosis not present

## 2020-07-12 DIAGNOSIS — R269 Unspecified abnormalities of gait and mobility: Secondary | ICD-10-CM

## 2020-07-12 NOTE — Progress Notes (Signed)
Chief Complaint  Patient presents with  . Follow-up    New room. Here with daughter, Anna Hunt. Here to discuss MRI/carotid US results. No recent faalls per pt.     HISTORICAL  NATURE Anna Hunt is a 74 year old female, seen in request by her primary care physician Dr. Harrington Challenger, Dwyane Luo, for evaluation of multiple falls, unsteady gait, she is accompanied by her brother at today's visit June 16, 2020.  I reviewed and summarized the referring note.PMHX DM HLD Stroke  Back surgery  I saw her in 2014 for stroke, left MCA stroke in 1998 at age 22, presented with right hemianopia, stroke involving left occipital lobe, was treated with Coumadin for extended period of time  Because there was no embolic source found, later was switched to aspirin plus Plavix, then aspirin monotherapy since 2009  Personally reviewed MRI of the brain March 2020, which was taken for her complaints of dizziness, old left temporal occipital and left thalamus PCA territory stroke, moderate small vessel disease, no acute intracranial process  MRA of the brain showed intracranial atherosclerotic disease  She denies a previous history of gait abnormality, since September 2021, she noticed gradual onset gait abnormality, seems to get worse within 1 month, she fell few times, tends to bump into things, rely on Anna Hunt for longer distance now,  She also had long history of urinary urgency, frequency, denies neck pain, denies upper or lower extremity sensory changes, or other also reported mild slurred speech  She was recently diagnosed with left breast cancer, pending left lobectomy by Dr. Donne Hazel  UPDATE Jul 12 2020: She is accompanied by her daughter Anna Hunt at today's visit, who is also her power of attorney Personally reviewed MRI of the brain with without contrast June 22, 2020, chronic left occipital lobe infarction, lacunar infarction of left pons, moderate chronic small vessel disease,  MRI of cervical spine showed  multilevel degenerative changes, moderately severe spinal stenosis at C5-6, C6-7, variable degree of foraminal narrowing  She was evaluated by neurosurgeon Dr. Kristeen Miss on October 2021, decided to hold off cervical decompression surgery now  Ultrasound of carotid artery showed less than 39% stenosis bilaterally anterograde flow of vertebral artery  Echocardiogram showed ejection fraction 60 to 65%  She complains more than 6 months history of slowly progressive worsening gait abnormality, urinary urgency,  She has no clinical witnessed seizure, but daughter reported staring spell, since starting Depakote ER 500mg  qhs, she also has some mild memory loss, Mini-Mental Status Examination is 27/30, daughter reported that she is doing better with Depakote every night   REVIEW OF SYSTEMS: Full 14 system review of systems performed and notable only for as above All other review of systems were negative.  ALLERGIES: Allergies  Allergen Reactions  . Niaspan [Niacin Er] Hives and Swelling    welps    HOME MEDICATIONS: Current Outpatient Medications  Medication Sig Dispense Refill  . ALPRAZolam (XANAX) 0.5 MG tablet Take 0.5 mg by mouth at bedtime.     Marland Kitchen aspirin 81 MG tablet Take 1 tablet (81 mg total) by mouth every morning. You will take this aspirin along with Plavix for 3 weeks after that stop aspirin and continue Plavix. 30 tablet 0  . BD PEN NEEDLE NANO U/F 32G X 4 MM MISC     . BENICAR 20 MG tablet Take 20 mg by mouth every morning.     Marland Kitchen BYETTA 10 MCG PEN 10 MCG/0.04ML SOPN Inject 10 mcg into the skin 2 (two) times  daily.     . Cholecalciferol (VITAMIN D) 2000 UNITS CAPS Take 2,000 Units by mouth every morning.     . clopidogrel (PLAVIX) 75 MG tablet Take 1 tablet (75 mg total) by mouth daily. 30 tablet 0  . divalproex (DEPAKOTE ER) 500 MG 24 hr tablet Take 1 tablet (500 mg total) by mouth at bedtime. 30 tablet 6  . metFORMIN (GLUCOPHAGE-XR) 500 MG 24 hr tablet Take 1,000 mg by mouth  at bedtime.     . pantoprazole (PROTONIX) 40 MG tablet Take 1 tablet (40 mg total) by mouth daily. 30 tablet 0  . PARoxetine (PAXIL) 20 MG tablet Take 20 mg by mouth at bedtime.     . simvastatin (ZOCOR) 40 MG tablet Take 40 mg by mouth at bedtime.      No current facility-administered medications for this visit.    PAST MEDICAL HISTORY: Past Medical History:  Diagnosis Date  . Diabetes mellitus without complication ([place])   . Displacement of cervical intervertebral disc without myelopathy   . Hypertension   . LBBB (left bundle branch block)   . Occipital stroke ([place]) - left   . Unspecified cerebral artery occlusion with cerebral infarction   . Vertigo     PAST SURGICAL HISTORY: Past Surgical History:  Procedure Laterality Date  . BACK SURGERY      FAMILY HISTORY: Family History  Problem Relation Age of Onset  . Heart attack Sister 46  . Breast cancer Sister 5  . Breast cancer Sister 64    SOCIAL HISTORY: Social History   Socioeconomic History  . Marital status: Divorced    Spouse name: Not on file  . Number of children: Not on file  . Years of education: Not on file  . Highest education level: Not on file  Occupational History  . Not on file  Tobacco Use  . Smoking status: Never Smoker  . Smokeless tobacco: Never Used  Substance and Sexual Activity  . Alcohol use: No  . Drug use: No  . Sexual activity: Not on file  Other Topics Concern  . Not on file  Social History Narrative   Patient is disabled. Patient is independent in ADL's   Social Determinants of Health   Financial Resource Strain:   . Difficulty of Paying Living Expenses: Not on file  Food Insecurity:   . Worried About Charity fundraiser in the Last Year: Not on file  . Ran Out of Food in the Last Year: Not on file  Transportation Needs:   . Lack of Transportation (Medical): Not on file  . Lack of Transportation (Non-Medical): Not on file  Physical Activity:   . Days of Exercise per Week:  Not on file  . Minutes of Exercise per Session: Not on file  Stress:   . Feeling of Stress : Not on file  Social Connections:   . Frequency of Communication with Friends and Family: Not on file  . Frequency of Social Gatherings with Friends and Family: Not on file  . Attends Religious Services: Not on file  . Active Member of Clubs or Organizations: Not on file  . Attends Archivist Meetings: Not on file  . Marital Status: Not on file  Intimate Partner Violence:   . Fear of Current or Ex-Partner: Not on file  . Emotionally Abused: Not on file  . Physically Abused: Not on file  . Sexually Abused: Not on file     PHYSICAL EXAM   Vitals:  07/12/20 1401  BP: 110/70  Pulse: 74  SpO2: 98%  Weight: 161 lb 8 oz (73.3 kg)  Height: 5' 2.5" (1.588 m)   Not recorded     Body mass index is 29.07 kg/m.  PHYSICAL EXAMNIATION:  Gen: NAD, conversant, well nourised, well groomed                     Cardiovascular: Regular rate rhythm, no peripheral edema, warm, nontender. Eyes: Conjunctivae clear without exudates or hemorrhage Neck: Supple, no carotid bruits. Pulmonary: Clear to auscultation bilaterally   NEUROLOGICAL EXAM:  MENTAL STATUS: MMSE - Mini Mental State Exam 07/12/2020  Orientation to time 5  Orientation to Place 5  Registration 3  Attention/ Calculation 5  Recall 0  Language- name 2 objects 2  Language- repeat 1  Language- follow 3 step command 3  Language- read & follow direction 1  Write a sentence 1  Copy design 1  Total score 27     CRANIAL NERVES: CN II: Visual fields are full to confrontation. Pupils are round equal and briskly reactive to light. CN III, IV, VI: extraocular movement are normal. No ptosis. CN V: Facial sensation is intact to light touch CN VII: Face is symmetric with normal eye closure  CN VIII: Hearing is normal to causal conversation. CN IX, X: Phonation is normal.   MOTOR: Mild right arm drift, bilateral lower  extremity spasticity, no significant muscle weakness,  REFLEXES: Reflexes are 3 and hyperreflexia on the right side at the biceps, triceps, knees, and ankles. Plantar responses are extensor bilaterally  SENSORY: Intact to light touch, pinprick and vibratory sensation are intact in fingers and toes.  COORDINATION: There is no trunk or limb dysmetria noted.  GAIT/STANCE: She needs push-up to get up from seated position, stiff, unsteady, dragging her right leg some, tends to hold right arm in elbow flexion   DIAGNOSTIC DATA (LABS, IMAGING, TESTING) - I reviewed patient records, labs, notes, testing and imaging myself where available.   ASSESSMENT AND PLAN  Anna Hunt is a 74 y.o. 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 Ellene Route, not a candidate for cervical decompression surgery,  I have discussed with her surgeon Dr. Donne Hazel, it is okay for her to proceed with planned left lobectomy, holding of Plavix 5 to 7 days prior to surgery  Keep Depakote ER 500 mg every night  Return to clinic in 4 months   Marcial Pacas, M.D. Ph.D.  [facility] [address] [place], [place] [address] Ph: [phone] Fax: [phone]  CC:  Lawerance Cruel, [place],  [place] [address]

## 2020-07-13 ENCOUNTER — Telehealth: Payer: Self-pay | Admitting: Oncology

## 2020-07-13 ENCOUNTER — Encounter: Payer: Self-pay | Admitting: *Deleted

## 2020-07-13 DIAGNOSIS — E1169 Type 2 diabetes mellitus with other specified complication: Secondary | ICD-10-CM | POA: Diagnosis not present

## 2020-07-13 DIAGNOSIS — C50919 Malignant neoplasm of unspecified site of unspecified female breast: Secondary | ICD-10-CM | POA: Diagnosis not present

## 2020-07-13 DIAGNOSIS — F429 Obsessive-compulsive disorder, unspecified: Secondary | ICD-10-CM | POA: Diagnosis not present

## 2020-07-13 NOTE — Progress Notes (Signed)
Nutrition  Patient identified by attending [facility] on 06/15/20.  Patient was given nutrition packet with RD contact information.    Chart reviewed.    74 year old female with new diagnosis of breast cancer.  Patient planning mastectomy and antiestrogens.  Currently receiving work up for dizziness, falls.    Ht: 62.5 inches Wt: 161 lb BMI: 29  2% weight loss in the last month, not significant  Patient currently not at nutritional risk.  Please consult RD if changes in nutritional status occur.    Laurana Magistro B. Zenia Resides, [place], [place] Registered Dietitian [phone] (mobile)

## 2020-07-13 NOTE — Telephone Encounter (Signed)
Called pt per 11/3 sch msg - no answer. Left message for patient to call back if reschedule is still needed

## 2020-07-14 ENCOUNTER — Encounter: Payer: Self-pay | Admitting: Oncology

## 2020-07-15 ENCOUNTER — Other Ambulatory Visit: Payer: Self-pay | Admitting: Oncology

## 2020-07-15 ENCOUNTER — Telehealth: Payer: Self-pay

## 2020-07-15 NOTE — Telephone Encounter (Signed)
This LPN spoke with pt's daughter, Caryl Asp and provided Joy with Dr. Cristal Generous office number, as [facility] Surgery is not on MyChart therefore she would not see the appts there. Joy verbalized understanding and will contact their office.

## 2020-07-22 ENCOUNTER — Ambulatory Visit: Payer: Medicare Other | Admitting: Oncology

## 2020-07-27 ENCOUNTER — Telehealth: Payer: Self-pay | Admitting: Oncology

## 2020-07-27 ENCOUNTER — Other Ambulatory Visit: Payer: Self-pay

## 2020-07-27 ENCOUNTER — Encounter: Payer: Self-pay | Admitting: Adult Health

## 2020-07-27 ENCOUNTER — Inpatient Hospital Stay: Payer: Medicare Other | Attending: Oncology | Admitting: Adult Health

## 2020-07-27 VITALS — BP 139/72 | HR 80 | Temp 97.4°F | Resp 18 | Ht 62.5 in | Wt 160.7 lb

## 2020-07-27 DIAGNOSIS — I10 Essential (primary) hypertension: Secondary | ICD-10-CM | POA: Insufficient documentation

## 2020-07-27 DIAGNOSIS — C50212 Malignant neoplasm of upper-inner quadrant of left female breast: Secondary | ICD-10-CM | POA: Diagnosis not present

## 2020-07-27 DIAGNOSIS — R55 Syncope and collapse: Secondary | ICD-10-CM | POA: Insufficient documentation

## 2020-07-27 DIAGNOSIS — Z8249 Family history of ischemic heart disease and other diseases of the circulatory system: Secondary | ICD-10-CM | POA: Diagnosis not present

## 2020-07-27 DIAGNOSIS — Z170 Estrogen receptor positive status [ER+]: Secondary | ICD-10-CM

## 2020-07-27 DIAGNOSIS — Z79899 Other long term (current) drug therapy: Secondary | ICD-10-CM | POA: Insufficient documentation

## 2020-07-27 DIAGNOSIS — I639 Cerebral infarction, unspecified: Secondary | ICD-10-CM | POA: Diagnosis not present

## 2020-07-27 DIAGNOSIS — Z79811 Long term (current) use of aromatase inhibitors: Secondary | ICD-10-CM | POA: Diagnosis not present

## 2020-07-27 DIAGNOSIS — Z803 Family history of malignant neoplasm of breast: Secondary | ICD-10-CM | POA: Diagnosis not present

## 2020-07-27 DIAGNOSIS — Z8673 Personal history of transient ischemic attack (TIA), and cerebral infarction without residual deficits: Secondary | ICD-10-CM | POA: Insufficient documentation

## 2020-07-27 DIAGNOSIS — C50812 Malignant neoplasm of overlapping sites of left female breast: Secondary | ICD-10-CM | POA: Diagnosis not present

## 2020-07-27 DIAGNOSIS — E119 Type 2 diabetes mellitus without complications: Secondary | ICD-10-CM | POA: Insufficient documentation

## 2020-07-27 MED ORDER — ANASTROZOLE 1 MG PO TABS: 1.0000 mg | ORAL_TABLET | Freq: Every day | ORAL | 4 refills | Status: DC

## 2020-07-27 NOTE — Progress Notes (Addendum)
Anna Hunt  [phone] [phone]) [phone]     ID: Anna Hunt DOB: September 30, 1945  MR#: [id number]  ASN#:[id number]  Patient Care Team: Lawerance Cruel, MD as PCP - General (Family Medicine) Rockwell Germany, RN as Oncology Nurse Navigator Mauro Kaufmann, RN as Oncology Nurse Navigator Rolm Bookbinder, MD as Consulting Physician (General Surgery) Magrinat, Virgie Dad, MD as Consulting Physician (Oncology) Kyung Rudd, MD as Consulting Physician (Radiation Oncology) Scot Dock, NP OTHER MD:  CHIEF COMPLAINT: Estrogen receptor positive breast cancer  CURRENT TREATMENT: Awaiting definitive surgery   HISTORY OF CURRENT ILLNESS: "Anna Hunt" had routine screening mammography on 03/28/2020 showing a possible abnormality in the left breast. She underwent left diagnostic mammography with tomography and left breast ultrasonography at [facility] on 05/10/2020 showing: breast density category C; 2.6 cm irregular mass in left breast at 11-12 o'clock; no significant left axillary abnormalities.  Accordingly on 06/06/2020 she proceeded to biopsy of the left breast area in question. The pathology from this procedure ([id number]) showed: invasive ductal carcinoma, grade 1-2. Prognostic indicators significant for: estrogen receptor, >95% positive with strong staining intensity and progesterone receptor, 0% negative. Proliferation marker Ki67 at 10%. HER2 negative by immunohistochemistry (1+).  The patient's subsequent history is as detailed below.   INTERVAL HISTORY: Anna Hunt is here today for f/u of her estrogen positive breast cancer with her daughter Caryl Asp.  Her surgery has been delayed due to her having syncopal episodes that required further evaluation before she could receive surgical clearance.  They determined that these were related to previous CVAs she has had and she has been cleared for surgery.  She meets with Dr. Donne Hazel tomorrow.    REVIEW OF SYSTEMS: Aamya has  several other comorbidities.  She has talked with her family and has decided that she does not want to undergo radiation therapy.  She says that she wants to have a lumpectomy and take the 5 year pill.  She denies any new issues today, and a detailed ROS was non contributory.     PAST MEDICAL HISTORY: Past Medical History:  Diagnosis Date  . Diabetes mellitus without complication ([place])   . Displacement of cervical intervertebral disc without myelopathy   . Hypertension   . LBBB (left bundle branch block)   . Occipital stroke ([place]) - left   . Unspecified cerebral artery occlusion with cerebral infarction   . Vertigo     PAST SURGICAL HISTORY: Past Surgical History:  Procedure Laterality Date  . BACK SURGERY      FAMILY HISTORY: Family History  Problem Relation Age of Onset  . Heart attack Sister 17  . Breast cancer Sister 83  . Breast cancer Sister 28   Her father died at age 95 from a car accident and her mother at age 82 from a heart attack. Anna Hunt has 2 brothers and 3 sisters. She lost one sister at age 67 to a heart attack. As far as cancer, she reports breast cancer in two of her sisters and skin cancer in her brother.   GYNECOLOGIC HISTORY:  No LMP recorded. Patient is postmenopausal. Menarche: 74 years old Age at first live birth: 74 years old [place] P 2 LMP age 6 Contraceptive: used for 3-4 years HRT unsure  Hysterectomy? no BSO? no   SOCIAL HISTORY: (updated 06/2020)  Anna Hunt retired from working for [employer]. She is divorced. She lives at home by herself (with no pets). Daughter Caryl Asp, age 43, works for as a Engineer, site here  in [place]. Daughter Leafy Ro, age 55, is a Educational psychologist in [place]. Anna Hunt has one grandchild. She is not a Ambulance person.    ADVANCED DIRECTIVES: not in place, would likely name daughter Caryl Asp as her HCPOA.   HEALTH MAINTENANCE: Social History   Tobacco Use  . Smoking status: Never Smoker  . Smokeless tobacco:  Never Used  Substance Use Topics  . Alcohol use: No  . Drug use: No     Colonoscopy: date unsure  PAP: date unsure  Bone density: date unsure   Allergies  Allergen Reactions  . Niaspan [Niacin Er] Hives and Swelling    welps    Current Outpatient Medications  Medication Sig Dispense Refill  . ALPRAZolam (XANAX) 0.5 MG tablet Take 0.5 mg by mouth at bedtime.     . BD PEN NEEDLE NANO U/F 32G X 4 MM MISC     . BENICAR 20 MG tablet Take 20 mg by mouth every morning.     Marland Kitchen BYETTA 10 MCG PEN 10 MCG/0.04ML SOPN Inject 10 mcg into the skin 2 (two) times daily.     . Cholecalciferol (VITAMIN D) 2000 UNITS CAPS Take 2,000 Units by mouth every morning.     . clopidogrel (PLAVIX) 75 MG tablet Take 1 tablet (75 mg total) by mouth daily. 30 tablet 0  . divalproex (DEPAKOTE ER) 500 MG 24 hr tablet Take 1 tablet (500 mg total) by mouth at bedtime. 30 tablet 6  . metFORMIN (GLUCOPHAGE-XR) 500 MG 24 hr tablet Take 1,000 mg by mouth at bedtime.     . Multiple Vitamin (MULTIVITAMIN WITH MINERALS) TABS tablet Take 1 tablet by mouth daily.    . pantoprazole (PROTONIX) 40 MG tablet Take 1 tablet (40 mg total) by mouth daily. 30 tablet 0  . PARoxetine (PAXIL) 20 MG tablet Take 20 mg by mouth at bedtime.     . simvastatin (ZOCOR) 40 MG tablet Take 40 mg by mouth at bedtime.     Derrill Memo ON 08/24/2020] anastrozole (ARIMIDEX) 1 MG tablet Take 1 tablet (1 mg total) by mouth daily. 90 tablet 4   No current facility-administered medications for this visit.    OBJECTIVE: White woman who appears older than stated age  97:   07/27/20 1400  BP: 139/72  Pulse: 80  Resp: 18  Temp: (!) 97.4 F (36.3 C)  SpO2: 97%     Body mass index is 28.92 kg/m.   Wt Readings from Last 3 Encounters:  07/27/20 160 lb 11.2 oz (72.9 kg)  07/12/20 161 lb 8 oz (73.3 kg)  06/16/20 167 lb (75.8 kg)      ECOG FS:1 - Symptomatic but completely ambulatory  GENERAL: Patient is a well appearing female in no acute  distress HEENT:  Sclerae anicteric.  Mask in place. Neck is supple.  NODES:  No cervical, supraclavicular, or axillary lymphadenopathy palpated.  BREAST EXAM:  Deferred. LUNGS:  Clear to auscultation bilaterally.  No wheezes or rhonchi. HEART:  Regular rate and rhythm. No murmur appreciated. ABDOMEN:  Soft, nontender.  Positive, normoactive bowel sounds. No organomegaly palpated. MSK:  No focal spinal tenderness to palpation.  EXTREMITIES:  No peripheral edema.   SKIN:  Clear with no obvious rashes or skin changes. No nail dyscrasia. NEURO:  Nonfocal. Well oriented.  Appropriate affect.     LAB RESULTS:  CMP     Component Value Date/Time   NA 141 06/15/2020 1223   K 4.0 06/15/2020 1223   CL 108 06/15/2020 1223  CO2 26 06/15/2020 1223   GLUCOSE 125 (H) 06/15/2020 1223   BUN 16 06/15/2020 1223   CREATININE 0.93 06/15/2020 1223   CALCIUM 9.9 06/15/2020 1223   PROT 7.5 06/15/2020 1223   ALBUMIN 4.2 06/15/2020 1223   AST 24 06/15/2020 1223   ALT 20 06/15/2020 1223   ALKPHOS 56 06/15/2020 1223   BILITOT 0.8 06/15/2020 1223   GFRNONAA >60 06/15/2020 1223   GFRAA >60 11/16/2018 1256    No results found for: TOTALPROTELP, ALBUMINELP, A1GS, A2GS, BETS, BETA2SER, GAMS, MSPIKE, SPEI  Lab Results  Component Value Date   WBC 6.7 06/15/2020   NEUTROABS 4.5 06/15/2020   HGB 12.3 06/15/2020   HCT 36.4 06/15/2020   MCV 90.3 06/15/2020   PLT 271 06/15/2020    No results found for: LABCA2  No components found for: [id number]  No results for input(s): INR in the last 168 hours.  No results found for: LABCA2  No results found for: OVF643  No results found for: PIR518  No results found for: ACZ660  No results found for: CA2729  No components found for: HGQUANT  No results found for: CEA1 / No results found for: CEA1   No results found for: AFPTUMOR  No results found for: CHROMOGRNA  No results found for: KPAFRELGTCHN, LAMBDASER, KAPLAMBRATIO (kappa/lambda light  chains)  No results found for: HGBA, HGBA2QUANT, HGBFQUANT, HGBSQUAN (Hemoglobinopathy evaluation)   No results found for: LDH  No results found for: IRON, TIBC, IRONPCTSAT (Iron and TIBC)  No results found for: FERRITIN  Urinalysis    Component Value Date/Time   COLORURINE YELLOW 11/16/2018 1526   APPEARANCEUR HAZY (A) 11/16/2018 1526   LABSPEC <1.005 (L) 11/16/2018 1526   PHURINE 6.0 11/16/2018 1526   GLUCOSEU NEGATIVE 11/16/2018 1526   HGBUR NEGATIVE 11/16/2018 [place] 11/16/2018 1526   KETONESUR NEGATIVE 11/16/2018 [place] 11/16/2018 1526   UROBILINOGEN 1.0 06/01/2007 1926   NITRITE NEGATIVE 11/16/2018 1526   LEUKOCYTESUR SMALL (A) 11/16/2018 1526     STUDIES: EEG  Result Date: 06/30/2020 Marcial Pacas, MD     06/30/2020  4:26 PM HISTORY: 74 year old female, with history of stroke, presented with gait abnormality TECHNIQUE: This is a routine 16 channel EEG recording with one channel devoted to a limited EKG recording.  It was performed during wakefulness, drowsiness and asleep.  Hyperventilation and photic stimulation were performed as activating procedures.  There are minimum muscle and movement artifact noted. Upon maximum arousal, posterior dominant waking rhythm consistent of mildly dysrhythmic activity in the range of 8 to 9 Hz. Activities are symmetric over the bilateral posterior derivations and attenuated with eye opening. Hyperventilation produced mild/moderate buildup with higher amplitude and the slower activities noted. Photic stimulation did not alter the tracing. During EEG recording, patient developed drowsiness and no deeper stage of sleep was achieved During EEG recording, there was no epileptiform discharge noted. EKG demonstrate sinus rhythm, with heart rate of 60 bpm CONCLUSION: This is a  normal awake EEG.  There is no electrodiagnostic evidence of epileptiform discharge. Marcial Pacas, M.D. Ph.D. [facility]  [place], [place] [address] Phone: [phone] Fax:      [phone]  ECHOCARDIOGRAM COMPLETE  Result Date: 06/29/2020    ECHOCARDIOGRAM REPORT   Patient Name:   SHINIKA ESTELLE Date of Exam: 06/29/2020 Medical Rec #:  [id number]      Height:       62.0 in Accession #:    [id number]  Weight:       167.0 lb Date of Birth:  04/28/1946       BSA:          1.771 m Patient Age:    12 years       BP:           143/80 mmHg Patient Gender: F              HR:           80 bpm. Exam Location:  [place] Procedure: 2D Echo, Cardiac Doppler and Color Doppler Indications:    I163.9 Stroke  History:        Patient has prior history of Echocardiogram examinations, most                 recent 11/17/2018. Risk Factors:Hypertension and Diabetes. LBBB.  Sonographer:    Cresenciano Lick RDCS Referring Phys: [id number] Marcial Pacas  Sonographer Comments: Suboptimal parasternal window. IMPRESSIONS  1. Left ventricular ejection fraction, by estimation, is 60 to 65%. The left ventricle has normal function. The left ventricle has no regional wall motion abnormalities. Left ventricular diastolic parameters are consistent with Grade I diastolic dysfunction (impaired relaxation).  2. Right ventricular systolic function is normal. The right ventricular size is mildly enlarged. There is normal pulmonary artery systolic pressure. The estimated right ventricular systolic pressure is 43.3 mmHg.  3. The mitral valve is normal in structure. Trivial mitral valve regurgitation. No evidence of mitral stenosis.  4. Tricuspid valve regurgitation is mild to moderate.  5. The aortic valve is tricuspid. There is moderate calcification of the aortic valve. There is moderate thickening of the aortic valve. Aortic valve regurgitation is not visualized. Mild to moderate aortic valve sclerosis/calcification is present, without any evidence of aortic stenosis.  6. The inferior vena cava is normal in size with greater than 50% respiratory variability,  suggesting right atrial pressure of 3 mmHg. FINDINGS  Left Ventricle: Left ventricular ejection fraction, by estimation, is 60 to 65%. The left ventricle has normal function. The left ventricle has no regional wall motion abnormalities. The left ventricular internal cavity size was normal in size. There is  no left ventricular hypertrophy. Left ventricular diastolic parameters are consistent with Grade I diastolic dysfunction (impaired relaxation). Normal left ventricular filling pressure. Right Ventricle: The right ventricular size is mildly enlarged. No increase in right ventricular wall thickness. Right ventricular systolic function is normal. There is normal pulmonary artery systolic pressure. The tricuspid regurgitant velocity is 2.73  m/s, and with an assumed right atrial pressure of 3 mmHg, the estimated right ventricular systolic pressure is 29.5 mmHg. Left Atrium: Left atrial size was normal in size. Right Atrium: Right atrial size was normal in size. Pericardium: There is no evidence of pericardial effusion. Mitral Valve: The mitral valve is normal in structure. Mild to moderate mitral annular calcification. Trivial mitral valve regurgitation. No evidence of mitral valve stenosis. Tricuspid Valve: The tricuspid valve is normal in structure. Tricuspid valve regurgitation is mild to moderate. No evidence of tricuspid stenosis. Aortic Valve: The aortic valve is tricuspid. There is moderate calcification of the aortic valve. There is moderate thickening of the aortic valve. Aortic valve regurgitation is not visualized. Mild to moderate aortic valve sclerosis/calcification is present, without any evidence of aortic stenosis. Pulmonic Valve: The pulmonic valve was normal in structure. Pulmonic valve regurgitation is not visualized. No evidence of pulmonic stenosis. Aorta: The aortic root is normal in size and structure. Venous: The inferior vena  cava is normal in size with greater than 50% respiratory  variability, suggesting right atrial pressure of 3 mmHg. IAS/Shunts: The interatrial septum appears to be lipomatous. No atrial level shunt detected by color flow Doppler.  LEFT VENTRICLE PLAX 2D LVIDd:         3.50 cm  Diastology LVIDs:         2.40 cm  LV e' medial:    5.59 cm/s LV PW:         0.90 cm  LV E/e' medial:  11.9 LV IVS:        0.90 cm  LV e' lateral:   6.68 cm/s LVOT diam:     1.90 cm  LV E/e' lateral: 10.0 LV SV:         56 LV SV Index:   32 LVOT Area:     2.84 cm  RIGHT VENTRICLE RV Basal diam:  4.20 cm RV S prime:     14.30 cm/s TAPSE (M-mode): 2.4 cm LEFT ATRIUM             Index       RIGHT ATRIUM           Index LA diam:        3.50 cm 1.98 cm/m  RA Area:     13.55 cm LA Vol (A2C):   44.5 ml 25.13 ml/m RA Volume:   36.85 ml  20.81 ml/m LA Vol (A4C):   40.0 ml 22.59 ml/m LA Biplane Vol: 43.4 ml 24.51 ml/m  AORTIC VALVE LVOT Vmax:   92.80 cm/s LVOT Vmean:  68.600 cm/s LVOT VTI:    0.198 m  AORTA Ao Root diam: 3.00 cm Ao Asc diam:  3.50 cm MITRAL VALVE                TRICUSPID VALVE MV Area (PHT): 4.60 cm     TR Peak grad:   29.8 mmHg MV Decel Time: 165 msec     TR Vmax:        273.00 cm/s MV E velocity: 66.70 cm/s MV A velocity: 104.00 cm/s  SHUNTS MV E/A ratio:  0.64         Systemic VTI:  0.20 m                             Systemic Diam: 1.90 cm Fransico Him MD Electronically signed by Fransico Him MD Signature Date/Time: 06/29/2020/12:58:38 PM    Final    VAS US CAROTID  Result Date: 07/03/2020 Carotid Arterial Duplex Study Indications:       CVA. Comparison Study:  No prior study Performing Technologist: Maudry Mayhew MHA, RDMS, RVT, RDCS  Examination Guidelines: A complete evaluation includes B-mode imaging, spectral Doppler, color Doppler, and power Doppler as needed of all accessible portions of each vessel. Bilateral testing is considered an integral part of a complete examination. Limited examinations for reoccurring indications may be performed as noted.  Right Carotid  Findings: +----------+--------+--------+--------+--------------------------+--------+           PSV cm/sEDV cm/sStenosisPlaque Description        Comments +----------+--------+--------+--------+--------------------------+--------+ CCA Prox  76      9                                                  +----------+--------+--------+--------+--------------------------+--------+  CCA Distal63      11              smooth and heterogenous            +----------+--------+--------+--------+--------------------------+--------+ ICA Prox  43      8               smooth and heterogenous            +----------+--------+--------+--------+--------------------------+--------+ ICA Distal80      17                                        tortuous +----------+--------+--------+--------+--------------------------+--------+ ECA       61      8               heterogenous and irregular         +----------+--------+--------+--------+--------------------------+--------+ +----------+--------+-------+----------------+-------------------+           PSV cm/sEDV cmsDescribe        Arm Pressure (mmHG) +----------+--------+-------+----------------+-------------------+ [id number]            Multiphasic, WNL                    +----------+--------+-------+----------------+-------------------+ +---------+--------+--+--------+-+---------+ VertebralPSV cm/s39EDV cm/s8Antegrade +---------+--------+--+--------+-+---------+  Left Carotid Findings: +----------+--------+--------+--------+-----------------------+--------+           PSV cm/sEDV cm/sStenosisPlaque Description     Comments +----------+--------+--------+--------+-----------------------+--------+ CCA Prox  104     21              smooth and heterogenous         +----------+--------+--------+--------+-----------------------+--------+ CCA Distal57      13                                               +----------+--------+--------+--------+-----------------------+--------+ ICA Prox  69      23              smooth and heterogenous         +----------+--------+--------+--------+-----------------------+--------+ ICA Distal86      17                                     tortuous +----------+--------+--------+--------+-----------------------+--------+ ECA       68      9               heterogenous and smooth         +----------+--------+--------+--------+-----------------------+--------+ +----------+--------+--------+----------------+-------------------+           PSV cm/sEDV cm/sDescribe        Arm Pressure (mmHG) +----------+--------+--------+----------------+-------------------+ [id number]             Multiphasic, WNL                    +----------+--------+--------+----------------+-------------------+ +---------+--------+--+--------+--+---------+ VertebralPSV cm/s81EDV cm/s19Antegrade +---------+--------+--+--------+--+---------+   Summary: Right Carotid: Velocities in the right ICA are consistent with a 1-39% stenosis. Left Carotid: Velocities in the left ICA are consistent with a 1-39% stenosis. Vertebrals:  Bilateral vertebral arteries demonstrate antegrade flow. Subclavians: Normal flow hemodynamics were seen in bilateral subclavian              arteries. *See table(s) above for measurements and observations.  Electronically signed by Durene Fruits  Brabham MD on 07/03/2020 at 3:18:12 PM.    Final      ELIGIBLE FOR AVAILABLE RESEARCH PROTOCOL: AET  ASSESSMENT: 74 y.o. [place] woman status post left breast upper inner quadrant biopsy 06/06/2020 for a clinical T2 N0, stage IIA invasive ductal carcinoma, grade 1 or 2, estrogen receptor positive, progesterone receptor and HER-2 negative, with an MIB-1 of 10%  (1) genetics testing on 06/22/2020 was negative.  Genes tested include: APC, ATM, AXIN2, BARD1, BMPR1A, BRCA1, BRCA2, BRIP1, CDH1, CDK4, CDKN2A (p14ARF), CDKN2A  (p16INK4a), CHEK2, CTNNA1, DICER1, EPCAM (Deletion/duplication testing only), GREM1 (promoter region deletion/duplication testing only), KIT, MEN1, MLH1, MSH2, MSH3, MSH6, MUTYH, NBN, NF1, NHTL1, PALB2, PDGFRA, PMS2, POLD1, POLE, PTEN, RAD50, RAD51C, RAD51D, RNF43, SDHB, SDHC, SDHD, SMAD4, SMARCA4. STK11, TP53, TSC1, TSC2, and VHL.  The following genes were evaluated for sequence changes only: SDHA and HOXB13 c.251G>A variant only.  (2) lumpectomy with sentinel lymph node sampling  (3) antiestrogens to start on 08/24/2020  PLAN: Anna Hunt met with myself and Dr. Jana Hakim to discuss her breast cancer.  I am thankful that she has received surgical clearance.  We reviewed that her plan for breast cancer treatment is a reasonable one and makes sense considering her other comorbidities.    Dr. Jana Hakim reviewed that there are two types of pills that she could potentially take to block estrogens in her body.  The first is Tamoxifen.  This blocks the estrogen from breast cells.  This pill is not recommended for Anna Hunt due to her history of strokes.    Instead she was recommended to take Anastrozole.  She has a h/o osteoporosis.  Her most recent bone density was in July, 2021.  We reviewed that Anastrozole can further weaken the bones, so optimizing bone health will be needed. She will start the Anastrozole on 08/24/2020 and will see Dr. Jana Hakim 6 weeks later.  At that time, she will discuss bisphosphanates with Dr. Jana Hakim.  Adverse effects such as hot flashes, vaginal dryness, and arthralgias are possible and this was reviewed.    We will see Anna Hunt back after her surgery in January.  She knows to call for any questions that may arise between now and her next appointment.  We are happy to see her sooner if needed.   Total encounter time: 30 minutes*  Wilber Bihari, NP 07/29/20 8:50 AM Medical Oncology and Hematology [facility] [place], [place] [address] Tel. [phone]     Fax. [phone]   ADDENDUM: Anna Hunt is a very good candidate for antiestrogens and is likely to derive great benefit from them. We discussed the difference between tamoxifen and anastrozole in detail. She understands that anastrozole and the aromatase inhibitors in general work by blocking estrogen production. Accordingly vaginal dryness, decrease in bone density, and of course hot flashes can result. The aromatase inhibitors can also negatively affect the cholesterol profile, although that is a minor effect. One out of 5 women on aromatase inhibitors we will feel "old and achy". This arthralgia/myalgia syndrome, which resembles fibromyalgia clinically, does resolve with stopping the medications. Accordingly this is not a reason to not try an aromatase inhibitor but it is a frequent reason to stop it (in other words 20% of women will not be able to tolerate these medications).  Tamoxifen on the other hand does not block estrogen production. It does not "take away a woman's estrogen". It blocks the estrogen receptor in breast cells. Like anastrozole, it can also cause hot flashes. As opposed to  anastrozole, tamoxifen has many estrogen-like effects. It is technically an estrogen receptor modulator. This means that in some tissues tamoxifen works like estrogen-- for example it helps strengthen the bones. It tends to improve the cholesterol profile. It can cause thickening of the endometrial lining, and even endometrial polyps or rarely cancer of the uterus.(The risk of uterine cancer due to tamoxifen is one additional cancer per thousand women year). It can cause vaginal wetness or stickiness. It can cause blood clots through this estrogen-like effect--the risk of blood clots with tamoxifen is exactly the same as with birth control pills or hormone replacement.  Neither of these agents causes mood changes or weight gain, despite the popular belief that they can have these side effects. We have data from studies  comparing either of these drugs with placebo, and in those cases the control group had the same amount of weight gain and depression as the group that took the drug.  She is opting for anastrozole.  If she is able to tolerate it (I will check about 6 weeks after she starts) we will work on preventing further worsening of her osteoporosis  I personally saw this patient and performed a substantive portion of this encounter with the listed APP documented above.   Chauncey Cruel, MD Medical Oncology and Hematology [facility] [address] [place], [place] [address] Tel. [phone]    Fax. [phone]      *Total Encounter Time as defined by the Centers for Medicare and Medicaid Services includes, in addition to the face-to-face time of a patient visit (documented in the note above) non-face-to-face time: obtaining and reviewing outside history, ordering and reviewing medications, tests or procedures, care coordination (communications with other health care professionals or caregivers) and documentation in the medical record.

## 2020-07-27 NOTE — Telephone Encounter (Signed)
Scheduled appts per 11/17 los. Gave pt a print out of AVS.

## 2020-07-28 ENCOUNTER — Other Ambulatory Visit: Payer: Self-pay | Admitting: General Surgery

## 2020-07-28 DIAGNOSIS — C50212 Malignant neoplasm of upper-inner quadrant of left female breast: Secondary | ICD-10-CM

## 2020-07-28 DIAGNOSIS — Z170 Estrogen receptor positive status [ER+]: Secondary | ICD-10-CM

## 2020-07-28 DIAGNOSIS — E119 Type 2 diabetes mellitus without complications: Secondary | ICD-10-CM | POA: Diagnosis not present

## 2020-07-28 DIAGNOSIS — H26493 Other secondary cataract, bilateral: Secondary | ICD-10-CM | POA: Diagnosis not present

## 2020-07-28 DIAGNOSIS — Z961 Presence of intraocular lens: Secondary | ICD-10-CM | POA: Diagnosis not present

## 2020-07-28 DIAGNOSIS — H53461 Homonymous bilateral field defects, right side: Secondary | ICD-10-CM | POA: Diagnosis not present

## 2020-07-28 DIAGNOSIS — C50412 Malignant neoplasm of upper-outer quadrant of left female breast: Secondary | ICD-10-CM | POA: Diagnosis not present

## 2020-07-29 ENCOUNTER — Encounter: Payer: Self-pay | Admitting: Adult Health

## 2020-08-02 ENCOUNTER — Encounter: Payer: Self-pay | Admitting: *Deleted

## 2020-08-09 ENCOUNTER — Encounter: Payer: Self-pay | Admitting: *Deleted

## 2020-08-10 ENCOUNTER — Encounter: Payer: Self-pay | Admitting: Oncology

## 2020-08-12 ENCOUNTER — Encounter: Payer: Self-pay | Admitting: *Deleted

## 2020-08-22 ENCOUNTER — Other Ambulatory Visit: Payer: Medicare Other

## 2020-09-06 NOTE — Pre-Procedure Instructions (Signed)
[id number] [place], [place] - [address] AT [facility] [address] [place] [address] Phone: [phone] Fax: [phone]      Your procedure is scheduled on Thursday, January 6th at 7:30 A.M.  Report to [facility] Main Entrance "A" at 5:30 A.M., and check in at the Admitting office.  Call this number if you have problems the morning of surgery:  [phone]  Call [phone] if you have any questions prior to your surgery date Monday-Friday 8am-4pm    Remember:  Do not eat after midnight the night before your surgery  You may drink clear liquids until 4:30 A.M. the morning of your surgery.   Clear liquids allowed are: Water, Non-Citrus Juices (without pulp), Carbonated Beverages, Clear Tea, Black Coffee Only, and Gatorade. (Please choose sugar-free options).  Please complete your 10oz bottle of water that was provided to you by 4:30 A.M. the morning of surgery. Nothing else to drink after you complete the bottle of water.     Take these medicines the morning of surgery with A SIP OF WATER  anastrozole (ARIMIDEX) pantoprazole (PROTONIX)  Follow your surgeon's instructions on when to stop/resume clopidogrel (PLAVIX).  If no instructions were given by your surgeon then you will need to call the office to get those instructions.    As of today, STOP taking any Aspirin (unless otherwise instructed by your surgeon) Aleve, Naproxen, Ibuprofen, Motrin, Advil, Goody's, BC's, all herbal medications, fish oil, and all vitamins.   WHAT DO I DO ABOUT MY DIABETES MEDICATION?   Marland Kitchen Do not take oral diabetes medicines metFORMIN (GLUCOPHAGE-XR) the morning of surgery.   . The day of surgery, do not take Byetta (exenatide).  . If your CBG is greater than 220 mg/dL, you may take  of your sliding scale (correction) dose of insulin.   HOW TO MANAGE YOUR DIABETES BEFORE AND AFTER SURGERY  Why is it important to control my blood sugar  before and after surgery? . Improving blood sugar levels before and after surgery helps healing and can limit problems. . A way of improving blood sugar control is eating a healthy diet by: o  Eating less sugar and carbohydrates o  Increasing activity/exercise o  Talking with your doctor about reaching your blood sugar goals . High blood sugars (greater than 180 mg/dL) can raise your risk of infections and slow your recovery, so you will need to focus on controlling your diabetes during the weeks before surgery. . Make sure that the doctor who takes care of your diabetes knows about your planned surgery including the date and location.  How do I manage my blood sugar before surgery? . Check your blood sugar at least 4 times a day, starting 2 days before surgery, to make sure that the level is not too high or low. . Check your blood sugar the morning of your surgery when you wake up and every 2 hours until you get to the Short Stay unit. o If your blood sugar is less than 70 mg/dL, you will need to treat for low blood sugar: - Do not take insulin. - Treat a low blood sugar (less than 70 mg/dL) with  cup of clear juice (cranberry or apple), 4 glucose tablets, OR glucose gel. - Recheck blood sugar in 15 minutes after treatment (to make sure it is greater than 70 mg/dL). If your blood sugar is not greater than 70 mg/dL on recheck, call [phone] for further instructions. . Report  your blood sugar to the short stay nurse when you get to Short Stay.  . If you are admitted to the hospital after surgery: o Your blood sugar will be checked by the staff and you will probably be given insulin after surgery (instead of oral diabetes medicines) to make sure you have good blood sugar levels. o The goal for blood sugar control after surgery is 80-180 mg/dL.                       Do not wear jewelry, make up, or nail polish            Do not wear lotions, powders, perfumes, or deodorant.            Do  not shave 48 hours prior to surgery.            Do not bring valuables to the hospital.            [facility] is not responsible for any belongings or valuables.  Do NOT Smoke (Tobacco/Vaping) or drink Alcohol 24 hours prior to your procedure If you use a CPAP at night, you may bring all equipment for your overnight stay.   Contacts, glasses, dentures or bridgework may not be worn into surgery.      For patients admitted to the hospital, discharge time will be determined by your treatment team.   Patients discharged the day of surgery will not be allowed to drive home, and someone needs to stay with them for 24 hours.    Special instructions:   [facility]- Preparing For Surgery  Before surgery, you can play an important role. Because skin is not sterile, your skin needs to be as free of germs as possible. You can reduce the number of germs on your skin by washing with CHG (chlorahexidine gluconate) Soap before surgery.  CHG is an antiseptic cleaner which kills germs and bonds with the skin to continue killing germs even after washing.    Oral Hygiene is also important to reduce your risk of infection.  Remember - BRUSH YOUR TEETH THE MORNING OF SURGERY WITH YOUR REGULAR TOOTHPASTE  Please do not use if you have an allergy to CHG or antibacterial soaps. If your skin becomes reddened/irritated stop using the CHG.  Do not shave (including legs and underarms) for at least 48 hours prior to first CHG shower. It is OK to shave your face.  Please follow these instructions carefully.   1. Shower the NIGHT BEFORE SURGERY and the MORNING OF SURGERY with CHG Soap.   2. If you chose to wash your hair, wash your hair first as usual with your normal shampoo.  3. After you shampoo, rinse your hair and body thoroughly to remove the shampoo.  4. Use CHG as you would any other liquid soap. You can apply CHG directly to the skin and wash gently with a scrungie or a clean washcloth.   5. Apply the  CHG Soap to your body ONLY FROM THE NECK DOWN.  Do not use on open wounds or open sores. Avoid contact with your eyes, ears, mouth and genitals (private parts). Wash Face and genitals (private parts)  with your normal soap.   6. Wash thoroughly, paying special attention to the area where your surgery will be performed.  7. Thoroughly rinse your body with warm water from the neck down.  8. DO NOT shower/wash with your normal soap after using and rinsing off the  CHG Soap.  9. Pat yourself dry with a CLEAN TOWEL.  10. Wear CLEAN PAJAMAS to bed the night before surgery  11. Place CLEAN SHEETS on your bed the night of your first shower and DO NOT SLEEP WITH PETS.   Day of Surgery: Wear Clean/Comfortable clothing the morning of surgery Do not apply any deodorants/lotions.   Remember to brush your teeth WITH YOUR REGULAR TOOTHPASTE.   Please read over the following fact sheets that you were given.

## 2020-09-07 ENCOUNTER — Other Ambulatory Visit: Payer: Self-pay

## 2020-09-07 ENCOUNTER — Encounter (HOSPITAL_COMMUNITY): Payer: Self-pay

## 2020-09-07 ENCOUNTER — Encounter (HOSPITAL_COMMUNITY)
Admission: RE | Admit: 2020-09-07 | Discharge: 2020-09-07 | Disposition: A | Discharge status: Home / Self Care | Payer: Medicare Other | Source: Ambulatory Visit | Attending: General Surgery | Admitting: General Surgery

## 2020-09-07 DIAGNOSIS — I10 Essential (primary) hypertension: Secondary | ICD-10-CM | POA: Insufficient documentation

## 2020-09-07 DIAGNOSIS — E119 Type 2 diabetes mellitus without complications: Secondary | ICD-10-CM | POA: Insufficient documentation

## 2020-09-07 DIAGNOSIS — Z01818 Encounter for other preprocedural examination: Secondary | ICD-10-CM | POA: Insufficient documentation

## 2020-09-07 DIAGNOSIS — M810 Age-related osteoporosis without current pathological fracture: Secondary | ICD-10-CM | POA: Diagnosis not present

## 2020-09-07 DIAGNOSIS — G459 Transient cerebral ischemic attack, unspecified: Secondary | ICD-10-CM | POA: Diagnosis not present

## 2020-09-07 DIAGNOSIS — C50919 Malignant neoplasm of unspecified site of unspecified female breast: Secondary | ICD-10-CM | POA: Diagnosis not present

## 2020-09-07 DIAGNOSIS — E1169 Type 2 diabetes mellitus with other specified complication: Secondary | ICD-10-CM | POA: Diagnosis not present

## 2020-09-07 DIAGNOSIS — E782 Mixed hyperlipidemia: Secondary | ICD-10-CM | POA: Diagnosis not present

## 2020-09-07 HISTORY — DX: Family history of other specified conditions: Z84.89

## 2020-09-07 HISTORY — DX: Malignant neoplasm of unspecified site of unspecified female breast: C50.919

## 2020-09-07 HISTORY — DX: Personal history of other mental and behavioral disorders: Z86.59

## 2020-09-07 LAB — BASIC METABOLIC PANEL
Anion gap: 14 (ref 5–15)
BUN: 15 mg/dL (ref 8–23)
CO2: 27 mmol/L (ref 22–32)
Calcium: 9.8 mg/dL (ref 8.9–10.3)
Chloride: 99 mmol/L (ref 98–111)
Creatinine, Ser: 0.93 mg/dL (ref 0.44–1.00)
GFR, Estimated: >60 mL/min (ref >60)
Glucose, Bld: 101 mg/dL — ABNORMAL HIGH (ref 70–99)
Potassium: 4.1 mmol/L (ref 3.5–5.1)
Sodium: 140 mmol/L (ref 135–145)

## 2020-09-07 LAB — CBC
HCT: 37.7 % (ref 36.0–46.0)
Hemoglobin: 13 g/dL (ref 12.0–15.0)
MCH: 31.5 pg (ref 26.0–34.0)
MCHC: 34.5 g/dL (ref 30.0–36.0)
MCV: 91.3 fL (ref 80.0–100.0)
Platelets: 230 10*3/uL (ref 150–400)
RBC: 4.13 MIL/uL (ref 3.87–5.11)
RDW: 12.4 % (ref 11.5–15.5)
WBC: 6 10*3/uL (ref 4.0–10.5)
nRBC: 0 % (ref 0.0–0.2)

## 2020-09-07 LAB — HEMOGLOBIN A1C
Hgb A1c MFr Bld: 5.4 % (ref 4.8–5.6)
Mean Plasma Glucose: 108.28 mg/dL

## 2020-09-07 LAB — GLUCOSE, CAPILLARY: Glucose-Capillary: 93 mg/dL (ref 70–99)

## 2020-09-07 NOTE — Progress Notes (Signed)
PCP - Dr. Duane Lope  Cardiologist - denies  PPM/ICD - denies  Chest x-ray - N/A EKG - 09/07/2020 Stress Test - denies ECHO - 06/29/2020 Cardiac Cath - denies  Sleep Study - denies CPAP - N/A  DM: Type II, patient stated does not check blood sugar    Blood Thinner Instructions: PLAVIX: patient stated to be stopped tomorrow Aspirin Instructions:N/A  ERAS Protcol - Yes PRE-SURGERY Ensure or G2- 10oz water given  COVID TEST- Scheduled for 09/12/20. Patient verbalized understanding of self-quarantine instructions, appointment time and place.  Anesthesia review: YES, seed placement; abnormal EKG  Patient denies shortness of breath, fever, cough and chest pain at PAT appointment  All instructions explained to the patient, with a verbal understanding of the material. Patient agrees to go over the instructions while at home for a better understanding. Patient also instructed to self quarantine after being tested for COVID-19. The opportunity to ask questions was provided.

## 2020-09-09 ENCOUNTER — Other Ambulatory Visit: Payer: Medicare Other

## 2020-09-09 ENCOUNTER — Other Ambulatory Visit (HOSPITAL_COMMUNITY): Payer: Medicare Other

## 2020-09-12 ENCOUNTER — Other Ambulatory Visit
Admission: RE | Admit: 2020-09-12 | Discharge: 2020-09-12 | Disposition: A | Discharge status: Home / Self Care | Payer: Medicare Other | Source: Ambulatory Visit | Attending: General Surgery | Admitting: General Surgery

## 2020-09-12 DIAGNOSIS — Z01812 Encounter for preprocedural laboratory examination: Secondary | ICD-10-CM | POA: Diagnosis not present

## 2020-09-12 DIAGNOSIS — Z20822 Contact with and (suspected) exposure to covid-19: Secondary | ICD-10-CM | POA: Diagnosis not present

## 2020-09-12 LAB — SARS CORONAVIRUS 2 (TAT 6-24 HRS): SARS Coronavirus 2: NEGATIVE

## 2020-09-14 DIAGNOSIS — C50212 Malignant neoplasm of upper-inner quadrant of left female breast: Secondary | ICD-10-CM | POA: Diagnosis not present

## 2020-09-15 ENCOUNTER — Ambulatory Visit (HOSPITAL_COMMUNITY): Payer: Medicare Other | Admitting: Certified Registered"

## 2020-09-15 ENCOUNTER — Encounter (HOSPITAL_COMMUNITY)
Admission: RE | Disposition: A | Discharge status: Home / Self Care | Payer: Self-pay | Source: Home / Self Care | Attending: General Surgery

## 2020-09-15 ENCOUNTER — Observation Stay (HOSPITAL_COMMUNITY)
Admission: RE | Admit: 2020-09-15 | Discharge: 2020-09-15 | Disposition: A | Discharge status: Home / Self Care | Payer: Medicare Other | Attending: General Surgery | Admitting: General Surgery

## 2020-09-15 ENCOUNTER — Encounter (HOSPITAL_COMMUNITY): Payer: Self-pay | Admitting: General Surgery

## 2020-09-15 ENCOUNTER — Other Ambulatory Visit: Payer: Self-pay

## 2020-09-15 ENCOUNTER — Ambulatory Visit (HOSPITAL_COMMUNITY): Payer: Medicare Other | Admitting: Vascular Surgery

## 2020-09-15 DIAGNOSIS — Z8673 Personal history of transient ischemic attack (TIA), and cerebral infarction without residual deficits: Secondary | ICD-10-CM | POA: Insufficient documentation

## 2020-09-15 DIAGNOSIS — C50912 Malignant neoplasm of unspecified site of left female breast: Secondary | ICD-10-CM | POA: Diagnosis not present

## 2020-09-15 DIAGNOSIS — Z7984 Long term (current) use of oral hypoglycemic drugs: Secondary | ICD-10-CM | POA: Insufficient documentation

## 2020-09-15 DIAGNOSIS — E119 Type 2 diabetes mellitus without complications: Secondary | ICD-10-CM | POA: Diagnosis not present

## 2020-09-15 DIAGNOSIS — C50919 Malignant neoplasm of unspecified site of unspecified female breast: Secondary | ICD-10-CM | POA: Diagnosis present

## 2020-09-15 DIAGNOSIS — G459 Transient cerebral ischemic attack, unspecified: Secondary | ICD-10-CM | POA: Diagnosis not present

## 2020-09-15 DIAGNOSIS — Z170 Estrogen receptor positive status [ER+]: Secondary | ICD-10-CM | POA: Diagnosis not present

## 2020-09-15 DIAGNOSIS — D0512 Intraductal carcinoma in situ of left breast: Secondary | ICD-10-CM | POA: Diagnosis not present

## 2020-09-15 DIAGNOSIS — N6012 Diffuse cystic mastopathy of left breast: Secondary | ICD-10-CM | POA: Diagnosis not present

## 2020-09-15 DIAGNOSIS — Z87891 Personal history of nicotine dependence: Secondary | ICD-10-CM | POA: Diagnosis not present

## 2020-09-15 DIAGNOSIS — I10 Essential (primary) hypertension: Secondary | ICD-10-CM | POA: Diagnosis not present

## 2020-09-15 DIAGNOSIS — C50212 Malignant neoplasm of upper-inner quadrant of left female breast: Secondary | ICD-10-CM | POA: Diagnosis not present

## 2020-09-15 DIAGNOSIS — Z171 Estrogen receptor negative status [ER-]: Secondary | ICD-10-CM | POA: Diagnosis not present

## 2020-09-15 HISTORY — PX: BREAST LUMPECTOMY WITH RADIOACTIVE SEED LOCALIZATION: SHX6424

## 2020-09-15 LAB — GLUCOSE, CAPILLARY
Glucose-Capillary: 100 mg/dL — ABNORMAL HIGH (ref 70–99)
Glucose-Capillary: 114 mg/dL — ABNORMAL HIGH (ref 70–99)

## 2020-09-15 SURGERY — BREAST LUMPECTOMY WITH RADIOACTIVE SEED LOCALIZATION
Anesthesia: General | Site: Breast | Laterality: Left

## 2020-09-15 MED ORDER — FENTANYL CITRATE (PF) 250 MCG/5ML IJ SOLN
INTRAMUSCULAR | Status: DC | PRN
  Administered 2020-09-15: 25 ug via INTRAVENOUS
  Administered 2020-09-15: 50 ug via INTRAVENOUS

## 2020-09-15 MED ORDER — METHYLENE BLUE 0.5 % INJ SOLN
INTRAVENOUS | Status: AC
  Filled 2020-09-15: qty 10

## 2020-09-15 MED ORDER — MIDAZOLAM HCL 2 MG/2ML IJ SOLN
INTRAMUSCULAR | Status: AC
  Filled 2020-09-15: qty 2

## 2020-09-15 MED ORDER — LACTATED RINGERS IV SOLN: INTRAVENOUS | Status: DC

## 2020-09-15 MED ORDER — CEFAZOLIN SODIUM-DEXTROSE 2-4 GM/100ML-% IV SOLN
2.0000 g | INTRAVENOUS | Status: AC
  Administered 2020-09-15: 2 g via INTRAVENOUS
  Filled 2020-09-15: qty 100

## 2020-09-15 MED ORDER — 0.9 % SODIUM CHLORIDE (POUR BTL) OPTIME
TOPICAL | Status: DC | PRN
  Administered 2020-09-15: 1000 mL

## 2020-09-15 MED ORDER — FENTANYL CITRATE (PF) 100 MCG/2ML IJ SOLN
INTRAMUSCULAR | Status: AC
  Filled 2020-09-15: qty 2

## 2020-09-15 MED ORDER — SODIUM CHLORIDE (PF) 0.9 % IJ SOLN
INTRAMUSCULAR | Status: AC
  Filled 2020-09-15: qty 10

## 2020-09-15 MED ORDER — MEPERIDINE HCL 25 MG/ML IJ SOLN: 6.2500 mg | INTRAMUSCULAR | Status: DC | PRN

## 2020-09-15 MED ORDER — ORAL CARE MOUTH RINSE: 15.0000 mL | Freq: Once | OROMUCOSAL | Status: AC

## 2020-09-15 MED ORDER — DEXAMETHASONE SODIUM PHOSPHATE 10 MG/ML IJ SOLN
INTRAMUSCULAR | Status: DC | PRN
  Administered 2020-09-15: 5 mg via INTRAVENOUS

## 2020-09-15 MED ORDER — LABETALOL HCL 5 MG/ML IV SOLN
INTRAVENOUS | Status: AC
  Filled 2020-09-15: qty 4

## 2020-09-15 MED ORDER — ACETAMINOPHEN 500 MG PO TABS: 1000.0000 mg | ORAL_TABLET | ORAL | Status: DC

## 2020-09-15 MED ORDER — ACETAMINOPHEN 10 MG/ML IV SOLN: 1000.0000 mg | Freq: Once | INTRAVENOUS | Status: DC | PRN

## 2020-09-15 MED ORDER — ENSURE PRE-SURGERY PO LIQD: 296.0000 mL | Freq: Once | ORAL | Status: DC

## 2020-09-15 MED ORDER — CEFAZOLIN SODIUM-DEXTROSE 2-4 GM/100ML-% IV SOLN: 2.0000 g | INTRAVENOUS | Status: DC

## 2020-09-15 MED ORDER — KETOROLAC TROMETHAMINE 15 MG/ML IJ SOLN
15.0000 mg | INTRAMUSCULAR | Status: DC
  Filled 2020-09-15: qty 1

## 2020-09-15 MED ORDER — PROPOFOL 10 MG/ML IV BOLUS
INTRAVENOUS | Status: AC
  Filled 2020-09-15: qty 20

## 2020-09-15 MED ORDER — FENTANYL CITRATE (PF) 250 MCG/5ML IJ SOLN
INTRAMUSCULAR | Status: AC
  Filled 2020-09-15: qty 5

## 2020-09-15 MED ORDER — CHLORHEXIDINE GLUCONATE 0.12 % MT SOLN
15.0000 mL | Freq: Once | OROMUCOSAL | Status: AC
  Administered 2020-09-15: 15 mL via OROMUCOSAL
  Filled 2020-09-15: qty 15

## 2020-09-15 MED ORDER — ACETAMINOPHEN 500 MG PO TABS
1000.0000 mg | ORAL_TABLET | ORAL | Status: AC
  Administered 2020-09-15: 1000 mg via ORAL
  Filled 2020-09-15: qty 2

## 2020-09-15 MED ORDER — AMISULPRIDE (ANTIEMETIC) 5 MG/2ML IV SOLN: 10.0000 mg | Freq: Once | INTRAVENOUS | Status: DC | PRN

## 2020-09-15 MED ORDER — LABETALOL HCL 5 MG/ML IV SOLN: 5.0000 mg | Freq: Once | INTRAVENOUS | Status: DC

## 2020-09-15 MED ORDER — ACETAMINOPHEN 160 MG/5ML PO SOLN: 325.0000 mg | Freq: Once | ORAL | Status: DC | PRN

## 2020-09-15 MED ORDER — EPHEDRINE SULFATE 50 MG/ML IJ SOLN
INTRAMUSCULAR | Status: DC | PRN
  Administered 2020-09-15: 10 mg via INTRAVENOUS

## 2020-09-15 MED ORDER — LIDOCAINE 2% (20 MG/ML) 5 ML SYRINGE
INTRAMUSCULAR | Status: DC | PRN
  Administered 2020-09-15: 40 mg via INTRAVENOUS

## 2020-09-15 MED ORDER — PROPOFOL 10 MG/ML IV BOLUS
INTRAVENOUS | Status: DC | PRN
  Administered 2020-09-15: 130 mg via INTRAVENOUS

## 2020-09-15 MED ORDER — BUPIVACAINE HCL (PF) 0.25 % IJ SOLN
INTRAMUSCULAR | Status: AC
  Filled 2020-09-15: qty 30

## 2020-09-15 MED ORDER — ACETAMINOPHEN 325 MG PO TABS
325.0000 mg | ORAL_TABLET | Freq: Once | ORAL | Status: DC | PRN
Start: 2020-09-15 — End: 2020-09-15

## 2020-09-15 MED ORDER — BUPIVACAINE HCL (PF) 0.25 % IJ SOLN
INTRAMUSCULAR | Status: DC | PRN
  Administered 2020-09-15: 10 mL

## 2020-09-15 MED ORDER — ONDANSETRON HCL 4 MG/2ML IJ SOLN
INTRAMUSCULAR | Status: DC | PRN
  Administered 2020-09-15: 4 mg via INTRAVENOUS

## 2020-09-15 MED ORDER — FENTANYL CITRATE (PF) 100 MCG/2ML IJ SOLN
25.0000 ug | INTRAMUSCULAR | Status: DC | PRN
Start: 2020-09-15 — End: 2020-09-15
  Administered 2020-09-15: 25 ug via INTRAVENOUS

## 2020-09-15 SURGICAL SUPPLY — 45 items
ADH SKN CLS APL DERMABOND .7 (GAUZE/BANDAGES/DRESSINGS) ×1
ADH SKNCLS APL OCTYL .7 VIOL (GAUZE/BANDAGES/DRESSINGS) ×1
APL PRP STRL LF DISP 70% ISPRP (MISCELLANEOUS) ×1
APPLIER CLIP 9.375 MED OPEN (MISCELLANEOUS)
APR CLP MED 9.3 20 MLT OPN (MISCELLANEOUS)
BINDER BREAST LRG (GAUZE/BANDAGES/DRESSINGS) ×1 IMPLANT
BINDER BREAST XLRG (GAUZE/BANDAGES/DRESSINGS) IMPLANT
CANISTER SUCT 3000ML PPV (MISCELLANEOUS) ×1 IMPLANT
CHLORAPREP W/TINT 26 (MISCELLANEOUS) ×2 IMPLANT
CLIP APPLIE 9.375 MED OPEN (MISCELLANEOUS) IMPLANT
CLIP VESOCCLUDE MED 6/CT (CLIP) ×2 IMPLANT
COVER PROBE W GEL 5X96 (DRAPES) ×2 IMPLANT
COVER SURGICAL LIGHT HANDLE (MISCELLANEOUS) ×2 IMPLANT
COVER WAND RF STERILE (DRAPES) ×1 IMPLANT
DERMABOND ADVANCED (GAUZE/BANDAGES/DRESSINGS) ×1
DERMABOND ADVANCED .7 DNX12 (GAUZE/BANDAGES/DRESSINGS) ×1 IMPLANT
DEVICE DUBIN SPECIMEN MAMMOGRA (MISCELLANEOUS) ×2 IMPLANT
DRAPE CHEST BREAST 15X10 FENES (DRAPES) ×2 IMPLANT
ELECT COATED BLADE 2.86 ST (ELECTRODE) ×2 IMPLANT
ELECT REM PT RETURN 9FT ADLT (ELECTROSURGICAL) ×2
ELECTRODE REM PT RTRN 9FT ADLT (ELECTROSURGICAL) ×1 IMPLANT
GLOVE BIO SURGEON STRL SZ7 (GLOVE) ×4 IMPLANT
GLOVE BIOGEL PI IND STRL 7.5 (GLOVE) ×1 IMPLANT
GLOVE BIOGEL PI INDICATOR 7.5 (GLOVE) ×1
GOWN STRL REUS W/ TWL LRG LVL3 (GOWN DISPOSABLE) ×2 IMPLANT
GOWN STRL REUS W/TWL LRG LVL3 (GOWN DISPOSABLE) ×4
KIT BASIN OR (CUSTOM PROCEDURE TRAY) ×2 IMPLANT
KIT MARKER MARGIN INK (KITS) ×2 IMPLANT
LIGHT WAVEGUIDE WIDE FLAT (MISCELLANEOUS) IMPLANT
NDL HYPO 25GX1X1/2 BEV (NEEDLE) ×1 IMPLANT
NEEDLE HYPO 25GX1X1/2 BEV (NEEDLE) ×2 IMPLANT
NS IRRIG 1000ML POUR BTL (IV SOLUTION) ×2 IMPLANT
PACK GENERAL/GYN (CUSTOM PROCEDURE TRAY) ×2 IMPLANT
PENCIL SMOKE EVACUATOR (MISCELLANEOUS) ×1 IMPLANT
STRIP CLOSURE SKIN 1/2X4 (GAUZE/BANDAGES/DRESSINGS) ×2 IMPLANT
SUT MNCRL AB 4-0 PS2 18 (SUTURE) ×2 IMPLANT
SUT MON AB 5-0 PS2 18 (SUTURE) ×1 IMPLANT
SUT SILK 2 0 SH (SUTURE) ×1 IMPLANT
SUT VIC AB 2-0 SH 27 (SUTURE) ×4
SUT VIC AB 2-0 SH 27XBRD (SUTURE) ×1 IMPLANT
SUT VIC AB 3-0 SH 27 (SUTURE) ×2
SUT VIC AB 3-0 SH 27X BRD (SUTURE) ×1 IMPLANT
SYR CONTROL 10ML LL (SYRINGE) ×2 IMPLANT
TOWEL GREEN STERILE (TOWEL DISPOSABLE) ×2 IMPLANT
TOWEL GREEN STERILE FF (TOWEL DISPOSABLE) ×2 IMPLANT

## 2020-09-15 NOTE — Anesthesia Procedure Notes (Signed)
Procedure Name: LMA Insertion Date/Time: 09/15/2020 7:38 AM Performed by: Elliot Dally, CRNA Pre-anesthesia Checklist: Patient identified, Emergency Drugs available, Suction available and Patient being monitored Patient Re-evaluated:Patient Re-evaluated prior to induction Oxygen Delivery Method: Circle System Utilized Preoxygenation: Pre-oxygenation with 100% oxygen Induction Type: IV induction Ventilation: Mask ventilation without difficulty LMA: LMA inserted LMA Size: 4.0 Number of attempts: 1 Airway Equipment and Method: Bite block Placement Confirmation: positive ETCO2 Tube secured with: Tape Dental Injury: Teeth and Oropharynx as per pre-operative assessment

## 2020-09-15 NOTE — Anesthesia Postprocedure Evaluation (Signed)
Anesthesia Post Note  Patient: Anna Hunt  Procedure(s) Performed: LEFT BREAST LUMPECTOMY WITH RADIOACTIVE SEED LOCALIZATION (Left Breast)     Patient location during evaluation: PACU Anesthesia Type: General Level of consciousness: awake and alert Pain management: pain level controlled Vital Signs Assessment: post-procedure vital signs reviewed and stable Respiratory status: spontaneous breathing, nonlabored ventilation, respiratory function stable and patient connected to nasal cannula oxygen Cardiovascular status: blood pressure returned to baseline and stable Postop Assessment: no apparent nausea or vomiting Anesthetic complications: no   No complications documented.  Last Vitals:  Vitals:   09/15/20 0900 09/15/20 0915  BP: 116/68 118/62  Pulse: 84 86  Resp: 14 16  Temp: (!) 36.2 C   SpO2: 93% 92%    Last Pain:  Vitals:   09/15/20 0915  TempSrc:   PainSc: 4                  Shelton Silvas

## 2020-09-15 NOTE — Anesthesia Preprocedure Evaluation (Addendum)
Anesthesia Evaluation  Patient identified by MRN, date of birth, ID band Patient awake    Reviewed: Allergy & Precautions, NPO status , Patient's Chart, lab work & pertinent test results  Airway Mallampati: I  TM Distance: >3 FB Neck ROM: Full    Dental  (+) Edentulous Upper, Edentulous Lower   Pulmonary former smoker,    breath sounds clear to auscultation       Cardiovascular hypertension, + dysrhythmias  Rhythm:Regular Rate:Normal     Neuro/Psych TIACVA    GI/Hepatic Neg liver ROS, GERD  Medicated,  Endo/Other  diabetes, Type 2, Oral Hypoglycemic Agents  Renal/GU negative Renal ROS     Musculoskeletal negative musculoskeletal ROS (+)   Abdominal Normal abdominal exam  (+)   Peds  Hematology negative hematology ROS (+)   Anesthesia Other Findings   Reproductive/Obstetrics                            Anesthesia Physical Anesthesia Plan  ASA: III  Anesthesia Plan: General   Post-op Pain Management:    Induction: Intravenous  PONV Risk Score and Plan: 4 or greater and Ondansetron, Dexamethasone, Scopolamine patch - Pre-op and Treatment may vary due to age or medical condition  Airway Management Planned: LMA  Additional Equipment: None  Intra-op Plan:   Post-operative Plan: Extubation in OR  Informed Consent: I have reviewed the patients History and Physical, chart, labs and discussed the procedure including the risks, benefits and alternatives for the proposed anesthesia with the patient or authorized representative who has indicated his/her understanding and acceptance.     Dental advisory given  Plan Discussed with: CRNA  Anesthesia Plan Comments: (Lab Results      Component                Value               Date                      WBC                      6.0                 09/07/2020                HGB                      13.0                09/07/2020                 HCT                      37.7                09/07/2020                MCV                      91.3                09/07/2020                PLT                      230  09/07/2020            EKG: normal EKG, normal sinus rhythm.  Echo:  1. Left ventricular ejection fraction, by estimation, is 60 to 65%. The  left ventricle has normal function. The left ventricle has no regional  wall motion abnormalities. Left ventricular diastolic parameters are  consistent with Grade I diastolic  dysfunction (impaired relaxation).  2. Right ventricular systolic function is normal. The right ventricular  size is mildly enlarged. There is normal pulmonary artery systolic  pressure. The estimated right ventricular systolic pressure is 0000000 mmHg.  3. The mitral valve is normal in structure. Trivial mitral valve  regurgitation. No evidence of mitral stenosis.  4. Tricuspid valve regurgitation is mild to moderate.  5. The aortic valve is tricuspid. There is moderate calcification of the  aortic valve. There is moderate thickening of the aortic valve. Aortic  valve regurgitation is not visualized. Mild to moderate aortic valve  sclerosis/calcification is present,  without any evidence of aortic stenosis.  6. The inferior vena cava is normal in size with greater than 50%  respiratory variability, suggesting right atrial pressure of 3 mmHg.  )       Anesthesia Quick Evaluation

## 2020-09-15 NOTE — Discharge Instructions (Signed)
[facility] Office Phone Number [phone]  BREAST BIOPSY/ PARTIAL MASTECTOMY: POST OP INSTRUCTIONS Take 400 mg of ibuprofen every 8 hours or 650 mg tylenol every 6 hours for next 72 hours then as needed. Use ice several times daily also. Always review your discharge instruction sheet given to you by the facility where your surgery was performed.  IF YOU HAVE DISABILITY OR FAMILY LEAVE FORMS, YOU MUST BRING THEM TO THE OFFICE FOR PROCESSING.  DO NOT GIVE THEM TO YOUR DOCTOR.  1. A prescription for pain medication may be given to you upon discharge.  Take your pain medication as prescribed, if needed.  If narcotic pain medicine is not needed, then you may take acetaminophen (Tylenol), naprosyn (Alleve) or ibuprofen (Advil) as needed. 2. Take your usually prescribed medications unless otherwise directed 3. If you need a refill on your pain medication, please contact your pharmacy.  They will contact our office to request authorization.  Prescriptions will not be filled after 5pm or on week-ends. 4. You should eat very light the first 24 hours after surgery, such as soup, crackers, pudding, etc.  Resume your normal diet the day after surgery. 5. Most patients will experience some swelling and bruising in the breast.  Ice packs and a good support bra will help.  Wear the breast binder provided or a sports bra for 72 hours day and night.  After that wear a sports bra during the day until you return to the office. Swelling and bruising can take several days to resolve.  6. It is common to experience some constipation if taking pain medication after surgery.  Increasing fluid intake and taking a stool softener will usually help or prevent this problem from occurring.  A mild laxative (Milk of Magnesia or Miralax) should be taken according to package directions if there are no bowel movements after 48 hours. 7. Unless discharge instructions indicate otherwise, you may remove your bandages 48  hours after surgery and you may shower at that time.  You may have steri-strips (small skin tapes) in place directly over the incision.  These strips should be left on the skin for 7-10 days and will come off on their own.  If your surgeon used skin glue on the incision, you may shower in 24 hours.  The glue will flake off over the next 2-3 weeks.  Any sutures or staples will be removed at the office during your follow-up visit. 8. ACTIVITIES:  You may resume regular daily activities (gradually increasing) beginning the next day.  Wearing a good support bra or sports bra minimizes pain and swelling.  You may have sexual intercourse when it is comfortable. a. You may drive when you no longer are taking prescription pain medication, you can comfortably wear a seatbelt, and you can safely maneuver your car and apply brakes. b. RETURN TO WORK:  ______________________________________________________________________________________ 9. You should see your doctor in the office for a follow-up appointment approximately two weeks after your surgery.  Your doctor's nurse will typically make your follow-up appointment when she calls you with your pathology report.  Expect your pathology report 3-4 business days after your surgery.  You may call to check if you do not hear from us after three days. 10. OTHER INSTRUCTIONS: _______________________________________________________________________________________________ _____________________________________________________________________________________________________________________________________ _____________________________________________________________________________________________________________________________________ _____________________________________________________________________________________________________________________________________  WHEN TO CALL DR Danese Dorsainvil: 1. Fever over 101.0 2. Nausea and/or vomiting. 3. Extreme swelling or  bruising. 4. Continued bleeding from incision. 5. Increased pain, redness, or drainage from the incision.  The clinic   staff is available to answer your questions during regular business hours.  Please don't hesitate to call and ask to speak to one of the nurses for clinical concerns.  If you have a medical emergency, go to the nearest emergency room or call 911.  A surgeon from [facility] is always on call at the hospital.  For further questions, please visit [email or web address]  

## 2020-09-15 NOTE — Op Note (Signed)
Preoperative diagnosis: left breast cancer Postoperative diagnosis: same as above Procedure: Left breast seed guided lumpectomy Surgeon: Dr Matt  Estimated blood loss: Minimal Drains: None Anesthesia: General Specimens: 1.  Left breast tissue marked with paint, the yellow paint was not functional so I placed a stitch medially 2.  Additional posterior and inferior margins marked short superior, long lateral, double deep Complications: None Sponge needle count was correct x2 at end of operation Disposition recovery stable condition  Indications: This is a 74-year-old with multiple medical problems who had a screening mammogram showing a left breast asymmetry.  This was a 2.2 cm asymmetry with calcifications on her mammogram.  On ultrasound there was a 2.6 cm mass in her axilla was negative.  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  She had a large hematoma and we waited till this resolved.  We eventually discussed a lumpectomy.  Due to her multiple medical problems and the need for a Decoster she declined a sentinel lymph node biopsy.  She had a radioactive seed placed prior to beginning.  Procedure: After informed consent was obtained she was were given antibiotics.  SCDs were in place.  She was placed under general anesthesia without complication.  She was prepped and draped in the standard sterile surgical fashion.  Surgical timeout was then performed.  Identified the seed in the central breast.  I infiltrated 10 cc of Marcaine.  I then made a periareolar incision.  I used the neoprobe to remove the seed and the surrounding tissue with an attempt to get a clear margin.  Mammogram confirmed removal of the seed and the clip and they were fairly close to the center of the specimen.  I did remove additional inferior margin as I thought that might be close.  Also remove the posterior margin so  the new posterior margin is the muscle.  Hemostasis was then obtained.  I closed the breast tissue with 2-0 Vicryl.  The skin was closed with 3-0 Vicryl 5-0 Monocryl.  Glue Steri-Strips were applied.  She tolerated this well was extubated transferred to recovery stable. 

## 2020-09-15 NOTE — Interval H&P Note (Signed)
History and Physical Interval Note:  09/15/2020 7:15 AM  Anna Hunt  has presented today for surgery, with the diagnosis of LEFT BREAST CANCER.  The various methods of treatment have been discussed with the patient and family. After consideration of risks, benefits and other options for treatment, the patient has consented to  Procedure(s): LEFT BREAST LUMPECTOMY WITH RADIOACTIVE SEED LOCALIZATION (Left) as a surgical intervention.  The patient's history has been reviewed, patient examined, no change in status, stable for surgery.  I have reviewed the patient's chart and labs.  Questions were answered to the patient's satisfaction.     [name]

## 2020-09-15 NOTE — Transfer of Care (Addendum)
Immediate Anesthesia Transfer of Care Note  Patient: Anna Hunt  Procedure(s) Performed: LEFT BREAST LUMPECTOMY WITH RADIOACTIVE SEED LOCALIZATION (Left Breast)  Patient Location: PACU  Anesthesia Type:General  Level of Consciousness: drowsy  Airway & Oxygen Therapy: Patient Spontanous Breathing and Patient connected to nasal cannula oxygen  Post-op Assessment: Report given to RN, Post -op Vital signs reviewed and stable and Dr. Hart Rochester informed of PACU VS  Post vital signs: Reviewed  Last Vitals:  Vitals Value Taken Time  BP 170/80 09/15/20 0835  Temp    Pulse 130 09/15/20 0833  Resp 15 09/15/20 0833  SpO2 99 % 09/15/20 0833  Vitals shown include unvalidated device data.  Last Pain:  Vitals:   09/15/20 0612  TempSrc:   PainSc: 0-No pain      Patients Stated Pain Goal: 3 (09/15/20 0612)  Complications: No complications documented.

## 2020-09-15 NOTE — H&P (Signed)
Anna Hunt is an 75 y.o. female.   Chief Complaint: breast cancer HPI: 33 yof with mmp who presents after screening mm shows a left breast asymmetry. she has 2 sisters with bca age 56 and 67. she has a 2.2 cm asymmetry with calcs on mm. on [place] there is a 2.6x1.9x1.6 cm mass present. her axillary [place] is negative. 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 she has no mass or dc she notes. we talked before about mastectomy. she has htn, dm, multiple strokes. recently she has had falls and is dizzy, not stable on its feet. she has been seen and evaluated by neurology. I think she is finally ready for surgery. her hematoma has resolved.    Past Medical History:  Diagnosis Date  . Breast cancer (HCC)   . Diabetes mellitus without complication (HCC)   . Displacement of cervical intervertebral disc without myelopathy   . Family history of adverse reaction to anesthesia    per patient "younger sister was nauseated"  . History of depression   . Hypertension   . LBBB (left bundle branch block)   . Occipital stroke (HCC) - left   . Unspecified cerebral artery occlusion with cerebral infarction   . Vertigo     Past Surgical History:  Procedure Laterality Date  . BACK SURGERY    . CATARACT EXTRACTION W/ INTRAOCULAR LENS  IMPLANT, BILATERAL    . TUBAL LIGATION      Family History  Problem Relation Age of Onset  . Heart attack Sister 82  . Breast cancer Sister 3  . Breast cancer Sister 35   Social History:  reports that she has quit smoking. She has never used smokeless tobacco. She reports that she does not drink alcohol and does not use drugs.  Allergies:  Allergies  Allergen Reactions  . Niaspan [Niacin Er] Hives and Swelling    welps    Medications Prior to Admission  Medication Sig Dispense Refill  . ALPRAZolam (XANAX) 0.5 MG tablet Take 0.5 mg by mouth at bedtime as needed for anxiety or sleep.    Marland Kitchen anastrozole  (ARIMIDEX) 1 MG tablet Take 1 tablet (1 mg total) by mouth daily. 90 tablet 4  . BENICAR 20 MG tablet Take 20 mg by mouth every morning.     Marland Kitchen BYETTA 10 MCG PEN 10 MCG/0.04ML SOPN Inject 10 mcg into the skin daily.    . Cholecalciferol (VITAMIN D) 2000 UNITS CAPS Take 2,000 Units by mouth every morning.     . divalproex (DEPAKOTE ER) 500 MG 24 hr tablet Take 1 tablet (500 mg total) by mouth at bedtime. 30 tablet 6  . metFORMIN (GLUCOPHAGE-XR) 500 MG 24 hr tablet Take 500 mg by mouth at bedtime.    . Multiple Vitamin (MULTIVITAMIN WITH MINERALS) TABS tablet Take 1 tablet by mouth daily.    Marland Kitchen PARoxetine (PAXIL) 20 MG tablet Take 20 mg by mouth at bedtime.     . simvastatin (ZOCOR) 40 MG tablet Take 40 mg by mouth at bedtime.     . BD PEN NEEDLE NANO U/F 32G X 4 MM MISC     . clopidogrel (PLAVIX) 75 MG tablet Take 1 tablet (75 mg total) by mouth daily. 30 tablet 0  . pantoprazole (PROTONIX) 40 MG tablet Take 1 tablet (40 mg total) by mouth daily. 30 tablet 0    Results for orders placed or  performed during the hospital encounter of 09/15/20 (from the past 48 hour(s))  Glucose, capillary     Status: Abnormal   Collection Time: 09/15/20  5:55 AM  Result Value Ref Range   Glucose-Capillary 100 (H) 70 - 99 mg/dL    Comment: Glucose reference range applies only to samples taken after fasting for at least 8 hours.   No results found.  Review of Systems  All other systems reviewed and are negative.   Blood pressure (!) 154/63, pulse 78, temperature 98.1 F (36.7 C), temperature source Oral, resp. rate 18, height 5' 2.5" (1.588 m), weight 72.6 kg, SpO2 98 %. Physical Exam  Physical Exam Rolm Bookbinder MD; 07/28/2020 11:16 AM) Breast Breast Lump-No Palpable Breast Mass. 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 she would like to forgo sn biopsy due to comorbidities. she is over 11 with er pos tumor but is a 2.6 cm tumor with [place]. I think this is reasonable however given full picture and need to use arms for her Monda. I discussed seed guided lumpectomy with overnight stay due to medical issues and evaluation by pt prior to discharge. discussed 10% risk of needing second surgery for margins, will stop plavix one week prior to surgery.  Rolm Bookbinder, MD 09/15/2020, 7:14 AM

## 2020-09-16 ENCOUNTER — Encounter (HOSPITAL_COMMUNITY): Payer: Self-pay | Admitting: General Surgery

## 2020-09-19 LAB — SURGICAL PATHOLOGY

## 2020-09-20 ENCOUNTER — Encounter: Payer: Self-pay | Admitting: *Deleted

## 2020-10-10 ENCOUNTER — Encounter: Payer: Self-pay | Admitting: Physical Therapy

## 2020-10-10 ENCOUNTER — Telehealth: Payer: Self-pay | Admitting: Oncology

## 2020-10-10 ENCOUNTER — Encounter: Payer: Self-pay | Admitting: Oncology

## 2020-10-10 NOTE — Telephone Encounter (Signed)
Called pt per 1/31 staff msg. Left a msg.

## 2020-10-11 ENCOUNTER — Telehealth: Payer: Self-pay | Admitting: *Deleted

## 2020-10-11 ENCOUNTER — Telehealth: Payer: Self-pay | Admitting: Neurology

## 2020-10-11 DIAGNOSIS — R32 Unspecified urinary incontinence: Secondary | ICD-10-CM | POA: Insufficient documentation

## 2020-10-11 MED ORDER — OXYBUTYNIN CHLORIDE ER 5 MG PO TB24: 5.0000 mg | ORAL_TABLET | Freq: Every day | ORAL | 5 refills | Status: DC

## 2020-10-11 NOTE — Addendum Note (Signed)
Addended by: Marcial Pacas on: 10/11/2020 04:07 PM   Modules accepted: Orders

## 2020-10-11 NOTE — Telephone Encounter (Signed)
Received call from patient's daughter stating her mom's appointment for tomorrow was changed from virtual to in person and she was wanting to know why. She states her mom doesn't drive and she would need to arrange her schedule to take her.  Her daughter states she was planning on being with her mom for the virtual visit.  Informed her I would change it back to virtual.

## 2020-10-11 NOTE — Telephone Encounter (Signed)
Daughter(Anna Hunt) has called in response to the mychart message she sent on 01-27.  She is asking for a call to discuss the neurological concerns pt see Dr Krista Blue for.  Anna Hunt mentioned the frequencies that pt is having urinary accidents in and out of the bed.  Please call Anna Hunt on work # [phone]

## 2020-10-11 NOTE — Progress Notes (Signed)
[facility]  [phone] Fax:([phone]) [phone]     ID: Anna Hunt DOB: 01-19-1946  MR#: [id number]  HQI#:[id number]  Patient Care Team: Lawerance Cruel, MD as PCP - General (Family Medicine) Rockwell Germany, RN as Oncology Nurse Navigator Mauro Kaufmann, RN as Oncology Nurse Navigator Rolm Bookbinder, MD as Consulting Physician (General Surgery) Magrinat, Virgie Dad, MD as Consulting Physician (Oncology) Kyung Rudd, MD as Consulting Physician (Radiation Oncology) Chauncey Cruel, MD OTHER MD:  I connected with Anna Hunt on 10/12/20 at  3:30 PM EST by video enabled telemedicine visit and verified that I am speaking with the correct person using two identifiers.   I discussed the limitations, risks, security and privacy concerns of performing an evaluation and management service by telemedicine and the availability of in-person appointments. I also discussed with the patient that there may be a patient responsible charge related to this service. The patient expressed understanding and agreed to proceed.   Other persons participating in the visit and their role in the encounter: daughter Anna Hunt   Patients location: Home Providers location: [place]    CHIEF COMPLAINT: Estrogen receptor positive breast cancer  CURRENT TREATMENT: Anastrozole   INTERVAL HISTORY: Solomiya was contacted today for follow up of her estrogen positive breast cancer.    Since her last visit, she opted to proceed with left lumpectomy on 09/15/2020 under Dr. Donne Hazel. 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  She started anastrozole in mid December. She is tolerating this with no hot flashes or vaginal dryness problems. She obtained this for $5 per prescription.    REVIEW OF SYSTEMS: Stepfanie tells me she did well with her surgery. It was not painful. The incision now  looks fine to her without swelling or redness or pain. The biggest problem she is having is balance issues. She is using a [place]. She sees Dr. Evelena Leyden in neurology to get some help with that.   COVID 19 VACCINATION STATUS:     HISTORY OF CURRENT ILLNESS: From the original intake note:  "Anna Hunt" had routine screening mammography on 03/28/2020 showing a possible abnormality in the left breast. She underwent left diagnostic mammography with tomography and left breast ultrasonography at [facility] on 05/10/2020 showing: breast density category C; 2.6 cm irregular mass in left breast at 11-12 o'clock; no significant left axillary abnormalities.  Accordingly on 06/06/2020 she proceeded to biopsy of the left breast area in question. The pathology from this procedure ([id number]) showed: invasive ductal carcinoma, grade 1-2. Prognostic indicators significant for: estrogen receptor, >95% positive with strong staining intensity and progesterone receptor, 0% negative. Proliferation marker Ki67 at 10%. HER2 negative by immunohistochemistry (1+).  The patient's subsequent history is as detailed below.   PAST MEDICAL HISTORY: Past Medical History:  Diagnosis Date   Breast cancer ([place])    Diabetes mellitus without complication ([place])    Displacement of cervical intervertebral disc without myelopathy    Family history of adverse reaction to anesthesia    per patient "younger sister was nauseated"   History of depression    Hypertension    LBBB (left bundle branch block)    Occipital stroke ([place]) - left    Unspecified cerebral artery occlusion with cerebral infarction    Vertigo     PAST SURGICAL HISTORY: Past Surgical History:  Procedure Laterality Date   BACK SURGERY     BREAST LUMPECTOMY WITH RADIOACTIVE  SEED LOCALIZATION Left 09/15/2020   Procedure: LEFT BREAST LUMPECTOMY WITH RADIOACTIVE SEED LOCALIZATION;  Surgeon: Rolm Bookbinder, MD;  Location: [facility];  Service: General;  Laterality:  Left;   CATARACT EXTRACTION W/ INTRAOCULAR LENS  IMPLANT, BILATERAL     TUBAL LIGATION      FAMILY HISTORY: Family History  Problem Relation Age of Onset   Heart attack Sister 87   Breast cancer Sister 31   Breast cancer Sister 36   Her father died at age 21 from a car accident and her mother at age 34 from a heart attack. Anna Hunt has 2 brothers and 3 sisters. She lost one sister at age 75 to a heart attack. As far as cancer, she reports breast cancer in two of her sisters and skin cancer in her brother.   GYNECOLOGIC HISTORY:  No LMP recorded. Patient is postmenopausal. Menarche: 75 years old Age at first live birth: 75 years old [place] P 2 LMP age 38 Contraceptive: used for 3-4 years HRT unsure  Hysterectomy? no BSO? no   SOCIAL HISTORY: (updated 06/2020)  Anna Hunt retired from working for [employer]. She is divorced. She lives at home by herself (with no pets). Daughter Anna Hunt, age 62, works for as a Engineer, site here in [place]. Daughter Anna Hunt, age 97, is a Educational psychologist in [place]. Anna Hunt has one grandchild. She is not a Ambulance person.    ADVANCED DIRECTIVES: not in place, would likely name daughter Anna Hunt as her HCPOA.   HEALTH MAINTENANCE: Social History   Tobacco Use   Smoking status: Former Smoker   Smokeless tobacco: Never Used   Tobacco comment: 12/29/2021per patient quit about 20 years ago  Vaping Use   Vaping Use: Never used  Substance Use Topics   Alcohol use: No   Drug use: No     Colonoscopy: date unsure  PAP: date unsure  Bone density: date unsure   Allergies  Allergen Reactions   Niaspan [Niacin Er] Hives and Swelling    welps    Current Outpatient Medications  Medication Sig Dispense Refill   ALPRAZolam (XANAX) 0.5 MG tablet Take 0.5 mg by mouth at bedtime as needed for anxiety or sleep.     anastrozole (ARIMIDEX) 1 MG tablet Take 1 tablet (1 mg total) by mouth daily. 90 tablet 4   BD PEN NEEDLE NANO U/F 32G X 4  MM MISC      BENICAR 20 MG tablet Take 20 mg by mouth every morning.      BYETTA 10 MCG PEN 10 MCG/0.04ML SOPN Inject 10 mcg into the skin daily.     Cholecalciferol (VITAMIN D) 2000 UNITS CAPS Take 2,000 Units by mouth every morning.      clopidogrel (PLAVIX) 75 MG tablet Take 1 tablet (75 mg total) by mouth daily. 30 tablet 0   divalproex (DEPAKOTE ER) 500 MG 24 hr tablet Take 1 tablet (500 mg total) by mouth at bedtime. 30 tablet 6   metFORMIN (GLUCOPHAGE-XR) 500 MG 24 hr tablet Take 500 mg by mouth at bedtime.     Multiple Vitamin (MULTIVITAMIN WITH MINERALS) TABS tablet Take 1 tablet by mouth daily.     oxybutynin (DITROPAN-XL) 5 MG 24 hr tablet Take 1 tablet (5 mg total) by mouth at bedtime. 30 tablet 5   pantoprazole (PROTONIX) 40 MG tablet Take 1 tablet (40 mg total) by mouth daily. 30 tablet 0   PARoxetine (PAXIL) 20 MG tablet Take 20 mg by mouth at bedtime.  simvastatin (ZOCOR) 40 MG tablet Take 40 mg by mouth at bedtime.      No current facility-administered medications for this visit.    OBJECTIVE: White woman who appears older than stated age  There were no vitals filed for this visit.   There is no height or weight on file to calculate BMI.   Wt Readings from Last 3 Encounters:  09/15/20 160 lb (72.6 kg)  09/07/20 161 lb 3.2 oz (73.1 kg)  07/27/20 160 lb 11.2 oz (72.9 kg)      ECOG FS:1 - Symptomatic but completely ambulatory  Telemedicine visit 10/12/2020   LAB RESULTS:  CMP     Component Value Date/Time   NA 140 09/07/2020 1600   K 4.1 09/07/2020 1600   CL 99 09/07/2020 1600   CO2 27 09/07/2020 1600   GLUCOSE 101 (H) 09/07/2020 1600   BUN 15 09/07/2020 1600   CREATININE 0.93 09/07/2020 1600   CREATININE 0.93 06/15/2020 1223   CALCIUM 9.8 09/07/2020 1600   PROT 7.5 06/15/2020 1223   ALBUMIN 4.2 06/15/2020 1223   AST 24 06/15/2020 1223   ALT 20 06/15/2020 1223   ALKPHOS 56 06/15/2020 1223   BILITOT 0.8 06/15/2020 1223   GFRNONAA >60  09/07/2020 1600   GFRNONAA >60 06/15/2020 1223   GFRAA >60 11/16/2018 1256    No results found for: TOTALPROTELP, ALBUMINELP, A1GS, A2GS, BETS, BETA2SER, GAMS, MSPIKE, SPEI  Lab Results  Component Value Date   WBC 6.0 09/07/2020   NEUTROABS 4.5 06/15/2020   HGB 13.0 09/07/2020   HCT 37.7 09/07/2020   MCV 91.3 09/07/2020   PLT 230 09/07/2020    No results found for: LABCA2  No components found for: IWPYKD983  No results for input(s): INR in the last 168 hours.  No results found for: LABCA2  No results found for: JAS505  No results found for: LZJ673  No results found for: ALP379  No results found for: CA2729  No components found for: HGQUANT  No results found for: CEA1 / No results found for: CEA1   No results found for: AFPTUMOR  No results found for: CHROMOGRNA  No results found for: KPAFRELGTCHN, LAMBDASER, KAPLAMBRATIO (kappa/lambda light chains)  No results found for: HGBA, HGBA2QUANT, HGBFQUANT, HGBSQUAN (Hemoglobinopathy evaluation)   No results found for: LDH  No results found for: IRON, TIBC, IRONPCTSAT (Iron and TIBC)  No results found for: FERRITIN  Urinalysis    Component Value Date/Time   COLORURINE YELLOW 11/16/2018 1526   APPEARANCEUR HAZY (A) 11/16/2018 1526   LABSPEC <1.005 (L) 11/16/2018 1526   PHURINE 6.0 11/16/2018 1526   GLUCOSEU NEGATIVE 11/16/2018 [place] 11/16/2018 [place] 11/16/2018 [place] 11/16/2018 1526   PROTEINUR NEGATIVE 11/16/2018 1526   UROBILINOGEN 1.0 06/01/2007 1926   NITRITE NEGATIVE 11/16/2018 1526   LEUKOCYTESUR SMALL (A) 11/16/2018 1526    STUDIES: No results found.   ELIGIBLE FOR AVAILABLE RESEARCH PROTOCOL: AET  ASSESSMENT: 75 y.o. [place] woman status post left breast upper inner quadrant biopsy 06/06/2020 for a clinical T2 N0, stage IIA invasive ductal carcinoma, grade 1 or 2, estrogen receptor positive, progesterone receptor and HER-2 negative,  with an MIB-1 of 10%  (1) genetics testing on 06/22/2020 found no deleterious mutations in APC, ATM, AXIN2, BARD1, BMPR1A, BRCA1, BRCA2, BRIP1, CDH1, CDK4, CDKN2A (p14ARF), CDKN2A (p16INK4a), CHEK2, CTNNA1, DICER1, EPCAM (Deletion/duplication testing only), GREM1 (promoter region deletion/duplication testing only), KIT, MEN1, MLH1, MSH2, MSH3, MSH6, MUTYH, NBN, NF1,  NHTL1, PALB2, PDGFRA, PMS2, POLD1, POLE, PTEN, RAD50, RAD51C, RAD51D, RNF43, SDHB, SDHC, SDHD, SMAD4, SMARCA4. STK11, TP53, TSC1, TSC2, and VHL.  The following genes were evaluated for sequence changes only: SDHA and HOXB13 c.251G>A variant only.  (2) lumpectomy without sentinel lymph node sampling 09/15/2020 showed a pT1c pNX invasive ductal carcinoma, grade 1, with negative margins  (3) anastrozole started 08/24/2020  (a) bone density September 2022   PLAN: Skarlette did well with her surgery and has recovered very nicely from that procedure. She is also tolerating anastrozole well and the plan will be to continue that a total of 5 years.  We discussed her balance problems. We do have a physical therapy program for balance and if Dr. Evelena Leyden her neurologist would like her to participate they will let us know and we will set her up for that.  Otherwise she will have mammography and a bone density in September at [facility]. She will see me in October  She knows to call for any other issue that may develop before that visit     Sarajane Jews C. Magrinat, MD 10/12/20 3:41 PM Medical Oncology and Hematology [facility] [place], [place] [address] Tel. [phone]    Fax. [phone]   I, Wilburn Mylar, am acting as scribe for Dr. Virgie Dad. Magrinat.  I, Lurline Del MD, have reviewed the above documentation for accuracy and completeness, and I agree with the above.    *Total Encounter Time as defined by the Centers for Medicare and Medicaid Services includes, in addition to the face-to-face time of a  patient visit (documented in the note above) non-face-to-face time: obtaining and reviewing outside history, ordering and reviewing medications, tests or procedures, care coordination (communications with other health care professionals or caregivers) and documentation in the medical record.

## 2020-10-12 ENCOUNTER — Inpatient Hospital Stay: Payer: Medicare Other | Attending: Oncology | Admitting: Oncology

## 2020-10-12 DIAGNOSIS — Z170 Estrogen receptor positive status [ER+]: Secondary | ICD-10-CM | POA: Diagnosis not present

## 2020-10-12 DIAGNOSIS — C50212 Malignant neoplasm of upper-inner quadrant of left female breast: Secondary | ICD-10-CM | POA: Diagnosis not present

## 2020-10-13 ENCOUNTER — Encounter: Payer: Self-pay | Admitting: *Deleted

## 2020-10-13 NOTE — Telephone Encounter (Signed)
Have talked with her daughter Caryl Asp, her mother has increased urinary frequency, urgency especially at nighttime, I have written oxybutynin 5 mg every night, prescription was send in

## 2020-10-19 ENCOUNTER — Telehealth: Payer: Self-pay | Admitting: Neurology

## 2020-10-19 DIAGNOSIS — E782 Mixed hyperlipidemia: Secondary | ICD-10-CM | POA: Diagnosis not present

## 2020-10-19 DIAGNOSIS — N390 Urinary tract infection, site not specified: Secondary | ICD-10-CM | POA: Diagnosis not present

## 2020-10-19 DIAGNOSIS — Z0000 Encounter for general adult medical examination without abnormal findings: Secondary | ICD-10-CM | POA: Diagnosis not present

## 2020-10-19 DIAGNOSIS — E1169 Type 2 diabetes mellitus with other specified complication: Secondary | ICD-10-CM | POA: Diagnosis not present

## 2020-10-19 DIAGNOSIS — F429 Obsessive-compulsive disorder, unspecified: Secondary | ICD-10-CM | POA: Diagnosis not present

## 2020-10-19 DIAGNOSIS — R32 Unspecified urinary incontinence: Secondary | ICD-10-CM | POA: Diagnosis not present

## 2020-10-19 DIAGNOSIS — M810 Age-related osteoporosis without current pathological fracture: Secondary | ICD-10-CM | POA: Diagnosis not present

## 2020-10-19 DIAGNOSIS — I679 Cerebrovascular disease, unspecified: Secondary | ICD-10-CM | POA: Diagnosis not present

## 2020-10-19 DIAGNOSIS — C50919 Malignant neoplasm of unspecified site of unspecified female breast: Secondary | ICD-10-CM | POA: Diagnosis not present

## 2020-10-19 DIAGNOSIS — I10 Essential (primary) hypertension: Secondary | ICD-10-CM | POA: Diagnosis not present

## 2020-10-19 DIAGNOSIS — I739 Peripheral vascular disease, unspecified: Secondary | ICD-10-CM | POA: Diagnosis not present

## 2020-10-19 DIAGNOSIS — H53461 Homonymous bilateral field defects, right side: Secondary | ICD-10-CM | POA: Diagnosis not present

## 2020-10-19 NOTE — Telephone Encounter (Signed)
Pt's daughter, Randee Huston (onDPR) called, she had a fall yesterday and call 911. What do I need to do to get her in assistant living before she hurts herself. She is even falling with her [place]. Would like a call from the nurse.

## 2020-10-19 NOTE — Telephone Encounter (Signed)
I spoke to the patient's daughter on [place]. They are starting to look at assisted living facilities for her. The patient has an appt today with her PCP to discuss this in more detail.  I also encouraged her to call her mother's insurance plan so she understands the coverage available. Additionally, contacting a medicare Education officer, museum at social services would be helpful. She appreciated the information.

## 2020-10-26 DIAGNOSIS — G459 Transient cerebral ischemic attack, unspecified: Secondary | ICD-10-CM | POA: Diagnosis not present

## 2020-10-26 DIAGNOSIS — I10 Essential (primary) hypertension: Secondary | ICD-10-CM | POA: Diagnosis not present

## 2020-10-26 DIAGNOSIS — M810 Age-related osteoporosis without current pathological fracture: Secondary | ICD-10-CM | POA: Diagnosis not present

## 2020-10-26 DIAGNOSIS — E1169 Type 2 diabetes mellitus with other specified complication: Secondary | ICD-10-CM | POA: Diagnosis not present

## 2020-10-26 DIAGNOSIS — C50919 Malignant neoplasm of unspecified site of unspecified female breast: Secondary | ICD-10-CM | POA: Diagnosis not present

## 2020-11-01 ENCOUNTER — Emergency Department (HOSPITAL_COMMUNITY): Payer: Medicare Other

## 2020-11-01 ENCOUNTER — Encounter (HOSPITAL_COMMUNITY): Payer: Self-pay | Admitting: Emergency Medicine

## 2020-11-01 ENCOUNTER — Inpatient Hospital Stay (HOSPITAL_COMMUNITY)
Admission: EM | Admit: 2020-11-01 | Discharge: 2020-11-05 | DRG: 690 | Disposition: A | Discharge status: Skilled Nursing Facility | Payer: Medicare Other | Attending: Family Medicine | Admitting: Family Medicine

## 2020-11-01 ENCOUNTER — Other Ambulatory Visit: Payer: Self-pay

## 2020-11-01 ENCOUNTER — Telehealth: Payer: Self-pay | Admitting: Neurology

## 2020-11-01 DIAGNOSIS — S22080A Wedge compression fracture of T11-T12 vertebra, initial encounter for closed fracture: Secondary | ICD-10-CM

## 2020-11-01 DIAGNOSIS — S0990XA Unspecified injury of head, initial encounter: Secondary | ICD-10-CM | POA: Diagnosis not present

## 2020-11-01 DIAGNOSIS — E440 Moderate protein-calorie malnutrition: Secondary | ICD-10-CM | POA: Insufficient documentation

## 2020-11-01 DIAGNOSIS — Z20822 Contact with and (suspected) exposure to covid-19: Secondary | ICD-10-CM | POA: Diagnosis not present

## 2020-11-01 DIAGNOSIS — Z7902 Long term (current) use of antithrombotics/antiplatelets: Secondary | ICD-10-CM

## 2020-11-01 DIAGNOSIS — R404 Transient alteration of awareness: Secondary | ICD-10-CM | POA: Diagnosis not present

## 2020-11-01 DIAGNOSIS — I6381 Other cerebral infarction due to occlusion or stenosis of small artery: Secondary | ICD-10-CM | POA: Diagnosis not present

## 2020-11-01 DIAGNOSIS — Z7984 Long term (current) use of oral hypoglycemic drugs: Secondary | ICD-10-CM

## 2020-11-01 DIAGNOSIS — Z803 Family history of malignant neoplasm of breast: Secondary | ICD-10-CM

## 2020-11-01 DIAGNOSIS — S199XXA Unspecified injury of neck, initial encounter: Secondary | ICD-10-CM | POA: Diagnosis not present

## 2020-11-01 DIAGNOSIS — W06XXXA Fall from bed, initial encounter: Secondary | ICD-10-CM | POA: Diagnosis present

## 2020-11-01 DIAGNOSIS — M545 Low back pain, unspecified: Secondary | ICD-10-CM | POA: Diagnosis not present

## 2020-11-01 DIAGNOSIS — N3000 Acute cystitis without hematuria: Secondary | ICD-10-CM

## 2020-11-01 DIAGNOSIS — R296 Repeated falls: Secondary | ICD-10-CM | POA: Diagnosis present

## 2020-11-01 DIAGNOSIS — Z87891 Personal history of nicotine dependence: Secondary | ICD-10-CM

## 2020-11-01 DIAGNOSIS — S20221A Contusion of right back wall of thorax, initial encounter: Secondary | ICD-10-CM

## 2020-11-01 DIAGNOSIS — M47812 Spondylosis without myelopathy or radiculopathy, cervical region: Secondary | ICD-10-CM | POA: Diagnosis present

## 2020-11-01 DIAGNOSIS — E1165 Type 2 diabetes mellitus with hyperglycemia: Secondary | ICD-10-CM | POA: Diagnosis not present

## 2020-11-01 DIAGNOSIS — C50212 Malignant neoplasm of upper-inner quadrant of left female breast: Secondary | ICD-10-CM | POA: Diagnosis present

## 2020-11-01 DIAGNOSIS — D649 Anemia, unspecified: Secondary | ICD-10-CM | POA: Diagnosis present

## 2020-11-01 DIAGNOSIS — R52 Pain, unspecified: Secondary | ICD-10-CM | POA: Diagnosis not present

## 2020-11-01 DIAGNOSIS — Z8673 Personal history of transient ischemic attack (TIA), and cerebral infarction without residual deficits: Secondary | ICD-10-CM

## 2020-11-01 DIAGNOSIS — M549 Dorsalgia, unspecified: Secondary | ICD-10-CM | POA: Diagnosis not present

## 2020-11-01 DIAGNOSIS — R627 Adult failure to thrive: Secondary | ICD-10-CM | POA: Diagnosis present

## 2020-11-01 DIAGNOSIS — R5381 Other malaise: Secondary | ICD-10-CM | POA: Diagnosis present

## 2020-11-01 DIAGNOSIS — W19XXXA Unspecified fall, initial encounter: Secondary | ICD-10-CM

## 2020-11-01 DIAGNOSIS — Z6828 Body mass index (BMI) 28.0-28.9, adult: Secondary | ICD-10-CM

## 2020-11-01 DIAGNOSIS — I10 Essential (primary) hypertension: Secondary | ICD-10-CM | POA: Diagnosis present

## 2020-11-01 DIAGNOSIS — N390 Urinary tract infection, site not specified: Secondary | ICD-10-CM | POA: Diagnosis present

## 2020-11-01 DIAGNOSIS — S30850A Superficial foreign body of lower back and pelvis, initial encounter: Secondary | ICD-10-CM | POA: Diagnosis not present

## 2020-11-01 DIAGNOSIS — M25551 Pain in right hip: Secondary | ICD-10-CM | POA: Diagnosis not present

## 2020-11-01 DIAGNOSIS — Z888 Allergy status to other drugs, medicaments and biological substances status: Secondary | ICD-10-CM

## 2020-11-01 DIAGNOSIS — E441 Mild protein-calorie malnutrition: Secondary | ICD-10-CM | POA: Diagnosis present

## 2020-11-01 DIAGNOSIS — F32A Depression, unspecified: Secondary | ICD-10-CM | POA: Diagnosis present

## 2020-11-01 DIAGNOSIS — B9620 Unspecified Escherichia coli [E. coli] as the cause of diseases classified elsewhere: Secondary | ICD-10-CM | POA: Diagnosis present

## 2020-11-01 DIAGNOSIS — Z79899 Other long term (current) drug therapy: Secondary | ICD-10-CM

## 2020-11-01 DIAGNOSIS — Z79811 Long term (current) use of aromatase inhibitors: Secondary | ICD-10-CM

## 2020-11-01 DIAGNOSIS — Z170 Estrogen receptor positive status [ER+]: Secondary | ICD-10-CM

## 2020-11-01 LAB — URINALYSIS, ROUTINE W REFLEX MICROSCOPIC
Bilirubin Urine: NEGATIVE
Glucose, UA: NEGATIVE mg/dL
Hgb urine dipstick: NEGATIVE
Ketones, ur: NEGATIVE mg/dL
Nitrite: POSITIVE — AB
Protein, ur: 30 mg/dL — AB
Specific Gravity, Urine: 1.011 (ref 1.005–1.030)
WBC, UA: >50 WBC/hpf — ABNORMAL HIGH (ref 0–5)
pH: 6 (ref 5.0–8.0)

## 2020-11-01 LAB — COMPREHENSIVE METABOLIC PANEL
ALT: 21 U/L (ref 0–44)
AST: 28 U/L (ref 15–41)
Albumin: 3.3 g/dL — ABNORMAL LOW (ref 3.5–5.0)
Alkaline Phosphatase: 77 U/L (ref 38–126)
Anion gap: 9 (ref 5–15)
BUN: 14 mg/dL (ref 8–23)
CO2: 25 mmol/L (ref 22–32)
Calcium: 9.5 mg/dL (ref 8.9–10.3)
Chloride: 104 mmol/L (ref 98–111)
Creatinine, Ser: 0.95 mg/dL (ref 0.44–1.00)
GFR, Estimated: >60 mL/min (ref >60)
Glucose, Bld: 182 mg/dL — ABNORMAL HIGH (ref 70–99)
Potassium: 4.2 mmol/L (ref 3.5–5.1)
Sodium: 138 mmol/L (ref 135–145)
Total Bilirubin: 0.8 mg/dL (ref 0.3–1.2)
Total Protein: 6.6 g/dL (ref 6.5–8.1)

## 2020-11-01 LAB — CBC WITH DIFFERENTIAL/PLATELET
Abs Immature Granulocytes: 0.03 10*3/uL (ref 0.00–0.07)
Basophils Absolute: 0 10*3/uL (ref 0.0–0.1)
Basophils Relative: 1 %
Eosinophils Absolute: 0 10*3/uL (ref 0.0–0.5)
Eosinophils Relative: 1 %
HCT: 35.4 % — ABNORMAL LOW (ref 36.0–46.0)
Hemoglobin: 11.3 g/dL — ABNORMAL LOW (ref 12.0–15.0)
Immature Granulocytes: 1 %
Lymphocytes Relative: 28 %
Lymphs Abs: 1.5 10*3/uL (ref 0.7–4.0)
MCH: 30.5 pg (ref 26.0–34.0)
MCHC: 31.9 g/dL (ref 30.0–36.0)
MCV: 95.7 fL (ref 80.0–100.0)
Monocytes Absolute: 0.6 10*3/uL (ref 0.1–1.0)
Monocytes Relative: 11 %
Neutro Abs: 3 10*3/uL (ref 1.7–7.7)
Neutrophils Relative %: 58 %
Platelets: 265 10*3/uL (ref 150–400)
RBC: 3.7 MIL/uL — ABNORMAL LOW (ref 3.87–5.11)
RDW: 13.1 % (ref 11.5–15.5)
WBC: 5.2 10*3/uL (ref 4.0–10.5)
nRBC: 0 % (ref 0.0–0.2)

## 2020-11-01 MED ORDER — PAROXETINE HCL 20 MG PO TABS
20.0000 mg | ORAL_TABLET | Freq: Every day | ORAL | Status: DC
  Administered 2020-11-01 – 2020-11-04 (×4): 20 mg via ORAL
  Filled 2020-11-01 (×5): qty 1

## 2020-11-01 MED ORDER — ACETAMINOPHEN 500 MG PO TABS
1000.0000 mg | ORAL_TABLET | Freq: Four times a day (QID) | ORAL | Status: DC | PRN
  Administered 2020-11-02 – 2020-11-05 (×3): 1000 mg via ORAL
  Filled 2020-11-01 (×3): qty 2

## 2020-11-01 MED ORDER — SIMVASTATIN 20 MG PO TABS
40.0000 mg | ORAL_TABLET | Freq: Every day | ORAL | Status: DC
  Administered 2020-11-01 – 2020-11-04 (×4): 40 mg via ORAL
  Filled 2020-11-01 (×4): qty 2

## 2020-11-01 MED ORDER — PANTOPRAZOLE SODIUM 40 MG PO TBEC
40.0000 mg | DELAYED_RELEASE_TABLET | Freq: Every day | ORAL | Status: DC
  Administered 2020-11-01 – 2020-11-05 (×5): 40 mg via ORAL
  Filled 2020-11-01 (×5): qty 1

## 2020-11-01 MED ORDER — DIVALPROEX SODIUM ER 500 MG PO TB24
500.0000 mg | ORAL_TABLET | Freq: Every day | ORAL | Status: DC
  Administered 2020-11-01 – 2020-11-05 (×5): 500 mg via ORAL
  Filled 2020-11-01 (×5): qty 1

## 2020-11-01 MED ORDER — METFORMIN HCL ER 500 MG PO TB24
1000.0000 mg | ORAL_TABLET | Freq: Every day | ORAL | Status: DC
  Administered 2020-11-01 – 2020-11-04 (×4): 1000 mg via ORAL
  Filled 2020-11-01 (×4): qty 2

## 2020-11-01 MED ORDER — ANASTROZOLE 1 MG PO TABS
1.0000 mg | ORAL_TABLET | Freq: Every day | ORAL | Status: DC
  Administered 2020-11-01 – 2020-11-05 (×5): 1 mg via ORAL
  Filled 2020-11-01 (×5): qty 1

## 2020-11-01 MED ORDER — SODIUM CHLORIDE 0.9 % IV SOLN
1.0000 g | Freq: Once | INTRAVENOUS | Status: AC
  Administered 2020-11-02: 1 g via INTRAVENOUS
  Filled 2020-11-01: qty 10

## 2020-11-01 MED ORDER — CLOPIDOGREL BISULFATE 75 MG PO TABS
75.0000 mg | ORAL_TABLET | Freq: Every day | ORAL | Status: DC
  Administered 2020-11-01 – 2020-11-05 (×5): 75 mg via ORAL
  Filled 2020-11-01 (×5): qty 1

## 2020-11-01 MED ORDER — EXENATIDE 10 MCG/0.04ML ~~LOC~~ SOPN: 10.0000 ug | PEN_INJECTOR | Freq: Every day | SUBCUTANEOUS | Status: DC

## 2020-11-01 MED ORDER — ALPRAZOLAM 0.5 MG PO TABS
0.5000 mg | ORAL_TABLET | Freq: Every evening | ORAL | Status: DC | PRN
  Administered 2020-11-02 – 2020-11-04 (×3): 0.5 mg via ORAL
  Filled 2020-11-01: qty 2
  Filled 2020-11-01 (×2): qty 1

## 2020-11-01 NOTE — ED Notes (Signed)
Walked patient to the bathroom patient did well patient is back in bed with family at bedside

## 2020-11-01 NOTE — ED Provider Notes (Signed)
Assumed care at shift change.  Awaiting UA, may need admission if infectious.  See prior notes for full H&P.  Briefly, 75 y.o. F here with increased frequent falls and urinary "accidents" per daughter.  Lives alone, no longer able to care for herself.  Work up today with stable labs, signs of T12 compression deformity, unknown age.  PT has evaluated today and recommended SNF placement.  UA here nitrite + with many bacteria, > 50 WBC.  Given these findings coupled with frequent falls, would benefit from hospitalization.    Discussed with hospitalist, Dr. Olevia Bowens-- will admit for ogoing care.   Larene Pickett, PA-C 11/02/20 0031    Merrily Pew, MD 11/02/20 [phone]

## 2020-11-01 NOTE — Evaluation (Signed)
Physical Therapy Evaluation Patient Details Name: Anna Hunt MRN: [id number] DOB: 16-Dec-1945 Today's Date: 11/01/2020   History of Present Illness  Pt is a 75 y/o female presenting to the ED following multiple falls and difficulty caring for herself at home. 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 PMH includes breast cancer, vertigo, DM, CVA, and L breast cancer.  Clinical Impression  Pt admitted secondary to problem above with deficits below. Pt requiring mod A for bed mobility tasks and min A for transfers and gait. 1 LOB noted when turning and required mod A for steadying. Pt easily distractible and requiring safety cues throughout. Per daughter, pt lives alone and has multiple falls. Feel pt would benefit from SNF level therapies prior to return home. Will continue to follow acutely.     Follow Up Recommendations SNF;Supervision/Assistance - 24 hour    Equipment Recommendations  Other (comment) (TBD)    Recommendations for Other Services       Precautions / Restrictions Precautions Precautions: Fall Precaution Comments: Multiple falls at home Restrictions Weight Bearing Restrictions: No      Mobility  Bed Mobility Overal bed mobility: Needs Assistance Bed Mobility: Rolling;Sidelying to Sit;Sit to Sidelying Rolling: Mod assist Sidelying to sit: Mod assist     Sit to sidelying: Mod assist General bed mobility comments: mod A for assist with rolling and trunk and LE assist for all bed mobility. Increased time required.    Transfers Overall transfer level: Needs assistance Equipment used: Rolling Durr (2 wheeled) Transfers: Sit to/from Stand Sit to Stand: Min assist         General transfer comment: Min A For lift assist and steadying to stand. Cues for safe hand placement.  Ambulation/Gait Ambulation/Gait assistance: Min assist;Mod assist Gait Distance (Feet): 50 Feet Assistive device: Rolling Dewing (2 wheeled) Gait  Pattern/deviations: Step-through pattern;Decreased stride length;Shuffle Gait velocity: Decreased   General Gait Details: Shuffle type gait. 1 LOB noted when turning requiring mod A for steadying. Otherwise requiring min A. Pt very distractable during gait and required safety cues.  Stairs            Wheelchair Mobility    Modified Rankin (Stroke Patients Only)       Balance Overall balance assessment: Needs assistance Sitting-balance support: No upper extremity supported;Feet supported Sitting balance-Leahy Scale: Good     Standing balance support: Bilateral upper extremity supported;During functional activity Standing balance-Leahy Scale: Poor Standing balance comment: Reliant on BUE support                             Pertinent Vitals/Pain Pain Assessment: Faces Faces Pain Scale: Hurts even more Pain Location: back Pain Descriptors / Indicators: Aching Pain Intervention(s): Limited activity within patient's tolerance;Monitored during session;Repositioned    Home Living Family/patient expects to be discharged to:: Private residence Living Arrangements: Alone Available Help at Discharge: Family;Available PRN/intermittently Type of Home: House Home Access: Stairs to enter Entrance Stairs-Rails: Right Entrance Stairs-Number of Steps: 4 Home Layout: One level Home Equipment: Alvi - 2 wheels;Shower seat      Prior Function Level of Independence: Needs assistance   Gait / Transfers Assistance Needed: Pt reports using a RW but had falls even with RW.  ADL's / Homemaking Assistance Needed: Reports needing help with transfers into and out of shower.        Hand Dominance        Extremity/Trunk Assessment   Upper Extremity Assessment Upper Extremity Assessment:  Generalized weakness    Lower Extremity Assessment Lower Extremity Assessment: Overall WFL for tasks assessed    Cervical / Trunk Assessment Cervical / Trunk Assessment: Other  exceptions Cervical / Trunk Exceptions: age indeterminate T12 fx  Communication   Communication: No difficulties  Cognition Arousal/Alertness: Awake/alert Behavior During Therapy: WFL for tasks assessed/performed Overall Cognitive Status: Impaired/Different from baseline Area of Impairment: Safety/judgement;Problem solving;Memory;Attention                   Current Attention Level: Sustained Memory: Decreased short-term memory   Safety/Judgement: Decreased awareness of deficits;Decreased awareness of safety   Problem Solving: Slow processing General Comments: Pt very distractable and requiring cues to stay on task. Decreased awareness of safety or deficits.      General Comments General comments (skin integrity, edema, etc.): Discussed current deficits and safety concerns about returning home alone with pt. Daughter present and supportive.    Exercises     Assessment/Plan    PT Assessment Patient needs continued PT services  PT Problem List Decreased strength;Decreased activity tolerance;Decreased balance;Decreased mobility;Decreased cognition;Decreased safety awareness;Decreased knowledge of use of DME;Decreased knowledge of precautions       PT Treatment Interventions DME instruction;Gait training;Therapeutic activities;Functional mobility training;Stair training;Therapeutic exercise;Balance training;Patient/family education;Cognitive remediation    PT Goals (Current goals can be found in the Care Plan section)  Acute Rehab PT Goals Patient Stated Goal: to go home PT Goal Formulation: With patient Time For Goal Achievement: 11/15/20 Potential to Achieve Goals: Good    Frequency Min 2X/week   Barriers to discharge Decreased caregiver support      Co-evaluation               AM-PAC PT "6 Clicks" Mobility  Outcome Measure Help needed turning from your back to your side while in a flat bed without using bedrails?: A Lot Help needed moving from lying on  your back to sitting on the side of a flat bed without using bedrails?: A Lot Help needed moving to and from a bed to a chair (including a wheelchair)?: A Little Help needed standing up from a chair using your arms (e.g., wheelchair or bedside chair)?: A Little Help needed to walk in hospital room?: A Little Help needed climbing 3-5 steps with a railing? : A Lot 6 Click Score: 15    End of Session Equipment Utilized During Treatment: Gait belt Activity Tolerance: Patient tolerated treatment well Patient left: in bed;with call bell/phone within reach;with family/visitor present (on stretcher in ED) Nurse Communication: Mobility status;Other (comment) (pt requesting meds and food) PT Visit Diagnosis: Unsteadiness on feet (R26.81);Muscle weakness (generalized) (M62.81);Repeated falls (R29.6);History of falling (Z91.81)    Time: 4010-2725 PT Time Calculation (min) (ACUTE ONLY): 19 min   Charges:   PT Evaluation $PT Eval Moderate Complexity: 1 Mod          Reuel Derby, PT, DPT  Acute Rehabilitation Services  Pager: [phone] Office: [phone]   Rudean Hitt 11/01/2020, 4:49 PM

## 2020-11-01 NOTE — ED Notes (Signed)
Patient transported to CT 

## 2020-11-01 NOTE — ED Triage Notes (Signed)
Pt to triage via [facility] from home.  She slid out of bed this morning and hit head.  Takes Plavix.  C/o lower back pain earlier but states it is better.  Pt lives alone and daughter wanting NH placement.  Reports increased incontinence.  Pt states she wears a diaper at night because she is not able to get to the bathroom fast enough.

## 2020-11-01 NOTE — Telephone Encounter (Signed)
I returned the call to her daughter, Caryl Asp (on [place]). Left a voicemail letting her know that we received her message and would notify Dr. Krista Blue. We will be able to view her workup, tests, etc. She already has a pending appt with our office on 11/16/20. If she needs to be seen sooner, I requested Joy to call us back so we can look at other times.

## 2020-11-01 NOTE — ED Provider Notes (Signed)
Care assumed from [facility], [place] at shift change with labs and imaging pending.   In brief, this patient is a 75 year old female past history of diabetes, breast cancer, vertigo who presents to the ED for evaluation of fall.  Daughter is at bedside who provides some history.  She states that patient slid out of bed this morning was found in her room holding the posterior right aspect of her head.  Patient reports that Freda Munro slid off but cannot provide any further information regarding the fall.  Daughter does report that patient lives alone and has been falling more frequently.  Patient was evaluated by primary care physician at the beginning of February recommended nursing home placement.  Patient is on Plavix.  Please see note from previous provider for full history/physical exam.    Physical Exam  BP (!) 178/74 (BP Location: Right Arm)   Pulse 70   Temp 99.4 F (37.4 C) (Oral)   Resp 16   Ht 5' 2.5" (1.588 m)   Wt 72.6 kg   SpO2 98%   BMI 28.81 kg/m   Physical Exam  ED Course/Procedures     Procedures  MDM    PLAN: Patient pending imaging, lab work, UA.  Social work has been contacted for placement.  Daughter understands that patient may board in the ED until sufficient placement and is agreeable to that.  MDM:  CT head negative for any acute intracranial normality.  CT C-spine shows no evidence of acute fracture of the C-spine.  Lumbar spine shows mild compression deformity of T12 vertebral body noted of uncertain age. Hip x-ray negative for any acute bony abnormality.  Discussed patient with Dr. Trenton Gammon (Neurosurgery). Recommends a TSLO brace and outpatient followup.   Updated daughter and patient on plan.   CBC shows no leukocytosis. Hgb stable is 11.3. CMP shows normal BUN/Cr.   Social work is working on placement for patient.   Patient signed out to oncoming provider with Urine pending.    1. Fall, initial encounter   2. Fall   3. Superficial bruising of  back, right, initial encounter   4. Compression fracture of T12 vertebra, initial encounter [facility])    Portions of this note were generated with Dragon dictation software. Dictation errors may occur despite best attempts at proofreading.   Volanda Napoleon, PA-C 11/01/20 2323    Quintella Reichert, MD 11/01/20 2350

## 2020-11-01 NOTE — Discharge Planning (Signed)
RNCM consulted regarding safe discharge planning (Home with Home Health vs Skilled Nursing Facility Placement).  Physical Therapy evaluation placed; will follow up after recommendations from PT.     

## 2020-11-01 NOTE — NC FL2 (Signed)
[place] MEDICAID FL2 LEVEL OF CARE SCREENING TOOL     IDENTIFICATION  Patient Name: Anna Hunt Birthdate: April 12, 1946 Sex: female Admission Date (Current Location): 11/01/2020  [facility] and [place] Number:  Herbalist and Address:  [facility] [place], [place] [address], [place], [place] [address]      Provider Number: [id number]  Attending Physician Name and Address:  Quintella Reichert, MD  Relative Name and Phone Number:  Zelda, Reames Daughter   [phone]    Current Level of Care: Hospital Recommended Level of Care: [facility] Prior Approval Number:    Date Approved/Denied:   PASRR Number: [id number]  Discharge Plan: SNF    Current Diagnoses: Patient Active Problem List   Diagnosis Date Noted  . Urinary incontinence 10/11/2020  . Cervical myelopathy ([place]) 06/24/2020  . Genetic testing 06/23/2020  . Cerebrovascular accident (CVA) ([place]) 06/23/2020  . Gait abnormality 06/16/2020  . Malignant neoplasm of upper-inner quadrant of left breast in female, estrogen receptor positive ([place]) 06/10/2020  . TIA (transient ischemic attack) 11/16/2018  . HTN (hypertension), benign 11/16/2018  . LBBB (left bundle branch block)   . Diabetes mellitus without complication ([place])   . Stroke ([place]) 01/22/2013  . Displacement of cervical intervertebral disc without myelopathy     Orientation RESPIRATION BLADDER Height & Weight     Self,Time,Situation,Place  Normal Incontinent Weight: 160 lb 0.9 oz (72.6 kg) Height:  5' 2.5" (158.8 cm)  BEHAVIORAL SYMPTOMS/MOOD NEUROLOGICAL BOWEL NUTRITION STATUS      Incontinent Diet (diabetic friendly)  AMBULATORY STATUS COMMUNICATION OF NEEDS Skin   Limited Assist Verbally Bruising (sfcattered bruising on lower extremeties)                       Personal Care Assistance Level of Assistance  Bathing,Feeding,Dressing Bathing Assistance: Limited assistance Feeding assistance: Independent Dressing  Assistance: Limited assistance     Functional Limitations Info  Sight,Hearing,Speech Sight Info: Adequate Hearing Info: Adequate Speech Info: Adequate    SPECIAL CARE FACTORS FREQUENCY  PT (By licensed PT),OT (By licensed OT)     PT Frequency: 5x weekly OT Frequency: 5x weekly            Contractures Contractures Info: Not present    Additional Factors Info  Code Status Code Status Info: Partial             Current Medications (11/01/2020):  This is the current hospital active medication list Current Facility-Administered Medications  Medication Dose Route Frequency Provider Last Rate Last Admin  . acetaminophen (TYLENOL) tablet 1,000 mg  1,000 mg Oral Q6H PRN Volanda Napoleon, PA-C      . ALPRAZolam Duanne Moron) tablet 0.5 mg  0.5 mg Oral QHS PRN Providence Lanius A, PA-C      . anastrozole (ARIMIDEX) tablet 1 mg  1 mg Oral Daily Layden, Lindsey A, PA-C      . clopidogrel (PLAVIX) tablet 75 mg  75 mg Oral Daily Layden, Lindsey A, PA-C      . divalproex (DEPAKOTE ER) 24 hr tablet 500 mg  500 mg Oral Daily Layden, Lindsey A, PA-C      . metFORMIN (GLUCOPHAGE-XR) 24 hr tablet 1,000 mg  1,000 mg Oral QHS Layden, Lindsey A, PA-C      . pantoprazole (PROTONIX) EC tablet 40 mg  40 mg Oral Daily Layden, Lindsey A, PA-C      . PARoxetine (PAXIL) tablet 20 mg  20 mg Oral QHS Providence Lanius  A, PA-C      . simvastatin (ZOCOR) tablet 40 mg  40 mg Oral QHS Volanda Napoleon, PA-C       Current Outpatient Medications  Medication Sig Dispense Refill  . acetaminophen (TYLENOL) 500 MG tablet Take 1,000 mg by mouth every 6 (six) hours as needed for mild pain.    Marland Kitchen ALPRAZolam (XANAX) 0.5 MG tablet Take 0.5 mg by mouth at bedtime as needed for anxiety or sleep.    Marland Kitchen anastrozole (ARIMIDEX) 1 MG tablet Take 1 tablet (1 mg total) by mouth daily. 90 tablet 4  . BYETTA 10 MCG PEN 10 MCG/0.04ML SOPN Inject 10 mcg into the skin daily.    . Cholecalciferol (VITAMIN D) 2000 UNITS CAPS Take 2,000 Units  by mouth every morning.     . clopidogrel (PLAVIX) 75 MG tablet Take 1 tablet (75 mg total) by mouth daily. 30 tablet 0  . divalproex (DEPAKOTE ER) 500 MG 24 hr tablet Take 1 tablet (500 mg total) by mouth at bedtime. 30 tablet 6  . metFORMIN (GLUCOPHAGE-XR) 500 MG 24 hr tablet Take 1,000 mg by mouth at bedtime.    . Multiple Vitamin (MULTIVITAMIN WITH MINERALS) TABS tablet Take 1 tablet by mouth daily.    Marland Kitchen PARoxetine (PAXIL) 20 MG tablet Take 20 mg by mouth at bedtime.     . simvastatin (ZOCOR) 40 MG tablet Take 40 mg by mouth at bedtime.     Marland Kitchen telmisartan (MICARDIS) 40 MG tablet Take 20 mg by mouth daily.    . BD PEN NEEDLE NANO U/F 32G X 4 MM MISC     . oxybutynin (DITROPAN-XL) 5 MG 24 hr tablet Take 1 tablet (5 mg total) by mouth at bedtime. (Patient not taking: Reported on 11/01/2020) 30 tablet 5  . pantoprazole (PROTONIX) 40 MG tablet Take 1 tablet (40 mg total) by mouth daily. (Patient not taking: Reported on 11/01/2020) 30 tablet 0     Discharge Medications: Please see discharge summary for a list of discharge medications.  Relevant Imaging Results:  Relevant Lab Results:   Additional Information SSN# [id number]  Vergie Living, LCSW

## 2020-11-01 NOTE — ED Provider Notes (Signed)
[place] EMERGENCY DEPARTMENT Provider Note   CSN: [id number] Arrival date & time: 11/01/20  0940     History Chief Complaint  Patient presents with  . Fall    LISMARY KIEHN is a 75 y.o. female.  75 y.o female with a PMH of DM, Breast CA, Vertigo resents to the ED via [facility] with a chief complaint of fall.  According to daughter at the bedside patient slid out of her bed this morning, was found in her room holding the posterior right aspect of her head.  Patient reports that she slid off, unknown how this occurred.  According to daughter she has had multiple falls in the last couple of weeks.  She is also had issues with her balance for several months.  She is followed by Dr. Evelena Leyden, who evaluated her recently. Evaluated by primary care physician at the beginning of February who recommended nursing home placement facilities.Patient reports symptoms of "loss of balance" have been ongoing for several months.She is currently on currently on Plavix. She reports no LOC today. Daughter states patient currently lives alone and cannot take care of herself.   Of note, patient wears depends and has had increase in urination with "small accidents" per daughter and there no urinary symptoms today reported per patient. No chest, shortness of breath or headache.                    The history is provided by the patient, medical records and a relative.  Fall This is a recurrent problem. The current episode started 1 to 2 hours ago. The problem occurs every several days. The problem has not changed since onset.Pertinent negatives include no chest pain, no abdominal pain, no headaches and no shortness of breath. The symptoms are aggravated by walking. The symptoms are relieved by rest.       Past Medical History:  Diagnosis Date  . Breast cancer ([place])   . Diabetes mellitus without complication ([place])   . Displacement of cervical intervertebral disc without myelopathy   . Family  history of adverse reaction to anesthesia    per patient "younger sister was nauseated"  . History of depression   . Hypertension   . LBBB (left bundle branch block)   . 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 Unspecified cerebral artery occlusion with cerebral infarction   . Vertigo     Patient Active Problem List   Diagnosis Date Noted  . Urinary incontinence 10/11/2020  . Cervical myelopathy ([place]) 06/24/2020  . Genetic testing 06/23/2020  . Cerebrovascular accident (CVA) ([place]) 06/23/2020  . Gait abnormality 06/16/2020  . Malignant neoplasm of upper-inner quadrant of left breast in female, estrogen receptor positive ([place]) 06/10/2020  . TIA (transient ischemic attack) 11/16/2018  . HTN (hypertension), benign 11/16/2018  . LBBB (left bundle branch block)   . Diabetes mellitus without complication ([place])   . Stroke ([place]) 01/22/2013  . Displacement of cervical intervertebral disc without myelopathy     Past Surgical History:  Procedure Laterality Date  . BACK SURGERY    . BREAST LUMPECTOMY WITH RADIOACTIVE SEED LOCALIZATION Left 09/15/2020   Procedure: LEFT BREAST LUMPECTOMY WITH RADIOACTIVE SEED LOCALIZATION;  Surgeon: Rolm Bookbinder, MD;  Location: [facility];  Service: General;  Laterality: Left;  . CATARACT EXTRACTION W/ INTRAOCULAR LENS  IMPLANT, BILATERAL    . TUBAL LIGATION       OB History   No obstetric history on file.  Family History  Problem Relation Age of Onset  . Heart attack Sister 80  . Breast cancer Sister 75  . Breast cancer Sister 6    Social History   Tobacco Use  . Smoking status: Former Research scientist (life sciences)  . Smokeless tobacco: Never Used  . Tobacco comment: 12/29/2021per patient quit about 20 years ago  Vaping Use  . Vaping Use: Never used  Substance Use Topics  . Alcohol use: No  . Drug use: No    Home Medications Prior to Admission medications   Medication Sig Start Date End Date Taking? Authorizing Provider  acetaminophen (TYLENOL) 500 MG tablet  Take 1,000 mg by mouth every 6 (six) hours as needed for mild pain.   Yes [provider]  ALPRAZolam Duanne Moron) 0.5 MG tablet Take 0.5 mg by mouth at bedtime as needed for anxiety or sleep. 12/11/12  Yes [provider]  anastrozole (ARIMIDEX) 1 MG tablet Take 1 tablet (1 mg total) by mouth daily. 08/24/20  Yes Causey, Charlestine Massed, NP  BYETTA 10 MCG PEN 10 MCG/0.04ML SOPN Inject 10 mcg into the skin daily. 12/26/12  Yes [provider]  Cholecalciferol (VITAMIN D) 2000 UNITS CAPS Take 2,000 Units by mouth every morning.    Yes [provider]  clopidogrel (PLAVIX) 75 MG tablet Take 1 tablet (75 mg total) by mouth daily. 11/17/18  Yes Thurnell Lose, MD  divalproex (DEPAKOTE ER) 500 MG 24 hr tablet Take 1 tablet (500 mg total) by mouth at bedtime. 06/23/20  Yes Marcial Pacas, MD  metFORMIN (GLUCOPHAGE-XR) 500 MG 24 hr tablet Take 1,000 mg by mouth at bedtime. 01/05/13  Yes [provider]  Multiple Vitamin (MULTIVITAMIN WITH MINERALS) TABS tablet Take 1 tablet by mouth daily.   Yes [provider]  PARoxetine (PAXIL) 20 MG tablet Take 20 mg by mouth at bedtime.  01/05/13  Yes [provider]  simvastatin (ZOCOR) 40 MG tablet Take 40 mg by mouth at bedtime.  01/05/13  Yes [provider]  telmisartan (MICARDIS) 40 MG tablet Take 20 mg by mouth daily. 09/13/20  Yes [provider]  BD PEN NEEDLE NANO U/F 32G X 4 MM MISC  12/26/12   [provider]  oxybutynin (DITROPAN-XL) 5 MG 24 hr tablet Take 1 tablet (5 mg total) by mouth at bedtime. Patient not taking: Reported on 11/01/2020 10/11/20   Marcial Pacas, MD  pantoprazole (PROTONIX) 40 MG tablet Take 1 tablet (40 mg total) by mouth daily. Patient not taking: Reported on 11/01/2020 11/17/18   Thurnell Lose, MD    Allergies    Niaspan [niacin er]  Review of Systems   Review of Systems  Constitutional: Negative for chills and fever.  HENT: Negative for sinus pain and sore  throat.   Respiratory: Negative for shortness of breath.   Cardiovascular: Negative for chest pain.  Gastrointestinal: Negative for abdominal pain, nausea and vomiting.  Genitourinary: Negative for flank pain.  Neurological: Positive for weakness. Negative for headaches.  All other systems reviewed and are negative.   Physical Exam Updated Vital Signs BP 134/70   Pulse 76   Temp 99.4 F (37.4 C) (Oral)   Resp 15   Ht 5' 2.5" (1.588 m)   Wt 72.6 kg   SpO2 98%   BMI 28.81 kg/m   Physical Exam Vitals and nursing note reviewed.  Constitutional:      Appearance: Normal appearance.  HENT:     Head: Normocephalic.     Mouth/Throat:  Mouth: Mucous membranes are moist.  Cardiovascular:     Rate and Rhythm: Normal rate.  Pulmonary:     Effort: Pulmonary effort is normal.     Breath sounds: No wheezing.  Abdominal:     General: Abdomen is flat.     Palpations: Abdomen is soft.     Tenderness: There is no abdominal tenderness. There is no right CVA tenderness or left CVA tenderness.  Musculoskeletal:     Cervical back: Normal range of motion and neck supple.  Skin:    General: Skin is warm and dry.     Findings: Bruising present.          Comments: Scattered bruising on lower extremities.   Neurological:     Mental Status: She is alert and oriented to person, place, and time.     Comments: No facial asymmetry, no dysarthria.  Moves all upper and lower extremities.  Gait is slow, does walk with a Beck.     ED Results / Procedures / Treatments   Labs (all labs ordered are listed, but only abnormal results are displayed) Labs Reviewed  CBC WITH DIFFERENTIAL/PLATELET  URINALYSIS, ROUTINE W REFLEX MICROSCOPIC  COMPREHENSIVE METABOLIC PANEL    EKG None  Radiology No results found.  Procedures Procedures   Medications Ordered in ED Medications - No data to display  ED Course  I have reviewed the triage vital signs and the nursing notes.  Pertinent labs &  imaging results that were available during my care of the patient were reviewed by me and considered in my medical decision making (see chart for details).    MDM Rules/Calculators/A&P   Patient arrived to the ED via EMS with a chief complaint of fall.  According to daughter at the bedside, patient has had multiple falls in the past couple weeks.  She is currently followed by Dr. Evelena Leyden of neurology, who has been monitoring her after her CVA.  She is currently on Plavix but no blood thinners.  She reports today sliding out of her back, striking the posterior aspect of her head on the desk, no loss of consciousness.  Daughter does report patient has had some " accidents ", including soaking her back, has wearing depends daily as he cannot make it to the bathroom.  Acording to chart review, it looks like patient was evaluated for this by Dr. Ronny Flurry, who prescribed oxybutynin 5 mg every night.  Patient has been taking his medication.  During my evaluation patient is overall nontoxic, non-ill-appearing, lungs are clear to auscultation.  Abdomen is soft.  Will obtain UA to rule out any infection worsening mental status.  She was ambulated by me, with assistive device Gavidia onto the bathroom.  Does have multiple bruises to her lower extremities, right hip, arms.  According to daughter she has had several falls in the last couple weeks.  No deformity to her head, no abrasions or lacerations noted, head appears atraumatic.  There is no cervical spine tenderness.  Will obtain CT head along with CT cervical spine to further evaluate patient post injury.  According to daughter, they are concerned that patient is declining and her mental status may be concerning for dementia.  There is no diagnosis of dementia documented on the chart at this time.  I discussed with daughter and patient that I will place a call for social work in order to obtain further placement.  Daughter and patient are not agreeable of SNF placement at  this time.  2:23 PM Spoke to [place] work, who has seen the patient and patient along with daughter are agreeable of SNF placement pending PT Eval, these orders have been placed.   Patient care signed out to [place]. PA at shift change.   Portions of this note were generated with Lobbyist. Dictation errors may occur despite best attempts at proofreading.  Final Clinical Impression(s) / ED Diagnoses Final diagnoses:  Fall, initial encounter  Superficial bruising of back, right, initial encounter    Rx / DC Orders ED Discharge Orders    None       Janeece Fitting, PA-C 11/01/20 [place], [place], DO 11/01/20 1532

## 2020-11-01 NOTE — Progress Notes (Signed)
CSW faxed referrals to several area SNFs  

## 2020-11-01 NOTE — Telephone Encounter (Signed)
Pt's daughter, Rowan Pollman (on [place]) called, got up this morning she was on the floor in her bedroom. She was holding her head, she could not get up. Called EMS, she has been taken to [facility] and they instructed me to call her PCP and her neurologist to let them know.

## 2020-11-02 DIAGNOSIS — W06XXXA Fall from bed, initial encounter: Secondary | ICD-10-CM | POA: Diagnosis present

## 2020-11-02 DIAGNOSIS — Z6828 Body mass index (BMI) 28.0-28.9, adult: Secondary | ICD-10-CM | POA: Diagnosis not present

## 2020-11-02 DIAGNOSIS — R627 Adult failure to thrive: Secondary | ICD-10-CM | POA: Diagnosis present

## 2020-11-02 DIAGNOSIS — Z7902 Long term (current) use of antithrombotics/antiplatelets: Secondary | ICD-10-CM | POA: Diagnosis not present

## 2020-11-02 DIAGNOSIS — E440 Moderate protein-calorie malnutrition: Secondary | ICD-10-CM | POA: Diagnosis present

## 2020-11-02 DIAGNOSIS — B9620 Unspecified Escherichia coli [E. coli] as the cause of diseases classified elsewhere: Secondary | ICD-10-CM | POA: Diagnosis present

## 2020-11-02 DIAGNOSIS — Z8673 Personal history of transient ischemic attack (TIA), and cerebral infarction without residual deficits: Secondary | ICD-10-CM | POA: Diagnosis not present

## 2020-11-02 DIAGNOSIS — N390 Urinary tract infection, site not specified: Secondary | ICD-10-CM | POA: Diagnosis present

## 2020-11-02 DIAGNOSIS — M47812 Spondylosis without myelopathy or radiculopathy, cervical region: Secondary | ICD-10-CM | POA: Diagnosis present

## 2020-11-02 DIAGNOSIS — Z79899 Other long term (current) drug therapy: Secondary | ICD-10-CM | POA: Diagnosis not present

## 2020-11-02 DIAGNOSIS — I10 Essential (primary) hypertension: Secondary | ICD-10-CM | POA: Diagnosis present

## 2020-11-02 DIAGNOSIS — Z803 Family history of malignant neoplasm of breast: Secondary | ICD-10-CM | POA: Diagnosis not present

## 2020-11-02 DIAGNOSIS — Z79811 Long term (current) use of aromatase inhibitors: Secondary | ICD-10-CM | POA: Diagnosis not present

## 2020-11-02 DIAGNOSIS — R296 Repeated falls: Secondary | ICD-10-CM | POA: Diagnosis present

## 2020-11-02 DIAGNOSIS — E1165 Type 2 diabetes mellitus with hyperglycemia: Secondary | ICD-10-CM | POA: Diagnosis present

## 2020-11-02 DIAGNOSIS — Z7984 Long term (current) use of oral hypoglycemic drugs: Secondary | ICD-10-CM | POA: Diagnosis not present

## 2020-11-02 DIAGNOSIS — M2550 Pain in unspecified joint: Secondary | ICD-10-CM | POA: Diagnosis not present

## 2020-11-02 DIAGNOSIS — R5381 Other malaise: Secondary | ICD-10-CM | POA: Diagnosis present

## 2020-11-02 DIAGNOSIS — Z20822 Contact with and (suspected) exposure to covid-19: Secondary | ICD-10-CM | POA: Diagnosis present

## 2020-11-02 DIAGNOSIS — I499 Cardiac arrhythmia, unspecified: Secondary | ICD-10-CM | POA: Diagnosis not present

## 2020-11-02 DIAGNOSIS — R2681 Unsteadiness on feet: Secondary | ICD-10-CM | POA: Diagnosis not present

## 2020-11-02 DIAGNOSIS — C50212 Malignant neoplasm of upper-inner quadrant of left female breast: Secondary | ICD-10-CM | POA: Diagnosis present

## 2020-11-02 DIAGNOSIS — F419 Anxiety disorder, unspecified: Secondary | ICD-10-CM | POA: Diagnosis not present

## 2020-11-02 DIAGNOSIS — Z170 Estrogen receptor positive status [ER+]: Secondary | ICD-10-CM | POA: Diagnosis not present

## 2020-11-02 DIAGNOSIS — Z87891 Personal history of nicotine dependence: Secondary | ICD-10-CM | POA: Diagnosis not present

## 2020-11-02 DIAGNOSIS — F32A Depression, unspecified: Secondary | ICD-10-CM | POA: Diagnosis present

## 2020-11-02 DIAGNOSIS — D649 Anemia, unspecified: Secondary | ICD-10-CM | POA: Diagnosis present

## 2020-11-02 DIAGNOSIS — R0902 Hypoxemia: Secondary | ICD-10-CM | POA: Diagnosis not present

## 2020-11-02 DIAGNOSIS — E441 Mild protein-calorie malnutrition: Secondary | ICD-10-CM | POA: Diagnosis present

## 2020-11-02 DIAGNOSIS — M5020 Other cervical disc displacement, unspecified cervical region: Secondary | ICD-10-CM | POA: Diagnosis not present

## 2020-11-02 DIAGNOSIS — E559 Vitamin D deficiency, unspecified: Secondary | ICD-10-CM | POA: Diagnosis not present

## 2020-11-02 DIAGNOSIS — M6281 Muscle weakness (generalized): Secondary | ICD-10-CM | POA: Diagnosis not present

## 2020-11-02 DIAGNOSIS — Z888 Allergy status to other drugs, medicaments and biological substances status: Secondary | ICD-10-CM | POA: Diagnosis not present

## 2020-11-02 DIAGNOSIS — I447 Left bundle-branch block, unspecified: Secondary | ICD-10-CM | POA: Diagnosis not present

## 2020-11-02 DIAGNOSIS — Z7401 Bed confinement status: Secondary | ICD-10-CM | POA: Diagnosis not present

## 2020-11-02 DIAGNOSIS — H814 Vertigo of central origin: Secondary | ICD-10-CM | POA: Diagnosis not present

## 2020-11-02 LAB — CBC
HCT: 37.3 % (ref 36.0–46.0)
Hemoglobin: 12.1 g/dL (ref 12.0–15.0)
MCH: 30.6 pg (ref 26.0–34.0)
MCHC: 32.4 g/dL (ref 30.0–36.0)
MCV: 94.2 fL (ref 80.0–100.0)
Platelets: 250 10*3/uL (ref 150–400)
RBC: 3.96 MIL/uL (ref 3.87–5.11)
RDW: 12.7 % (ref 11.5–15.5)
WBC: 5.8 10*3/uL (ref 4.0–10.5)
nRBC: 0 % (ref 0.0–0.2)

## 2020-11-02 LAB — IRON AND TIBC
Iron: 60 ug/dL (ref 28–170)
Saturation Ratios: 14 % (ref 10.4–31.8)
TIBC: 430 ug/dL (ref 250–450)
UIBC: 370 ug/dL

## 2020-11-02 LAB — RESP PANEL BY RT-PCR (FLU A&B, COVID) ARPGX2
Influenza A by PCR: NEGATIVE
Influenza B by PCR: NEGATIVE
SARS Coronavirus 2 by RT PCR: NEGATIVE

## 2020-11-02 LAB — GLUCOSE, CAPILLARY: Glucose-Capillary: 156 mg/dL — ABNORMAL HIGH (ref 70–99)

## 2020-11-02 LAB — RETICULOCYTES
Immature Retic Fract: 9.7 % (ref 2.3–15.9)
RBC.: 3.84 MIL/uL — ABNORMAL LOW (ref 3.87–5.11)
Retic Count, Absolute: 74.5 10*3/uL (ref 19.0–186.0)
Retic Ct Pct: 1.9 % (ref 0.4–3.1)

## 2020-11-02 LAB — FOLATE: Folate: 42.4 ng/mL (ref >5.9)

## 2020-11-02 LAB — VITAMIN B12: Vitamin B-12: 960 pg/mL — ABNORMAL HIGH (ref 180–914)

## 2020-11-02 LAB — FERRITIN: Ferritin: 67 ng/mL (ref 11–307)

## 2020-11-02 MED ORDER — METOPROLOL TARTRATE 5 MG/5ML IV SOLN
5.0000 mg | Freq: Once | INTRAVENOUS | Status: AC
  Administered 2020-11-02: 5 mg via INTRAVENOUS
  Filled 2020-11-02: qty 5

## 2020-11-02 MED ORDER — SODIUM CHLORIDE 0.9 % IV SOLN
1.0000 g | INTRAVENOUS | Status: DC
  Administered 2020-11-02 – 2020-11-03 (×2): 1 g via INTRAVENOUS
  Filled 2020-11-02 (×2): qty 1

## 2020-11-02 MED ORDER — ENOXAPARIN SODIUM 40 MG/0.4ML ~~LOC~~ SOLN
40.0000 mg | SUBCUTANEOUS | Status: DC
  Administered 2020-11-02 – 2020-11-05 (×4): 40 mg via SUBCUTANEOUS
  Filled 2020-11-02 (×4): qty 0.4

## 2020-11-02 NOTE — Evaluation (Signed)
Occupational Therapy Evaluation Patient Details Name: Anna Hunt MRN: [id number] DOB: Mar 06, 1946 Today's Date: 11/02/2020    History of Present Illness Pt is a 75 y/o female presenting to the ED following multiple falls and difficulty caring for herself at home. 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 PMH includes breast cancer, vertigo, DM, CVA, and L breast cancer.   Clinical Impression   Pt PTA: Pt living alone with increased falls and inability to properly care for self at home. Pt currently limited by decreased strength, decreased ability to care for self safely and decreased mobility. Pt reports little pain. Pt requires cues for attending to tasks and frequent reminders to reduce tangential thoughts. Pt set-upA to maxA for ADL. Pt minA overall for bed mobility and ADL functional mobility. Pt would benefit from continued OT skilled services for ADL, mobility and safety in SNF setting. OT following acutely. Presuming back precautions- brace donned and back precaution education initiated.    Follow Up Recommendations  SNF;Supervision/Assistance - 24 hour    Equipment Recommendations  3 in 1 bedside commode    Recommendations for Other Services       Precautions / Restrictions Precautions Precautions: Back;Fall Precaution Comments: Multiple falls at home Required Braces or Orthoses: Spinal Brace Spinal Brace: Thoracolumbosacral orthotic;Applied in sitting position;Applied in standing position Restrictions Weight Bearing Restrictions: No      Mobility Bed Mobility Overal bed mobility: Needs Assistance Bed Mobility: Rolling;Sidelying to Sit;Sit to Sidelying Rolling: Min assist Sidelying to sit: Mod assist     Sit to sidelying: Mod assist General bed mobility comments: minA for assist with rolling and modA for trunk and LE assist for all bed mobility. Increased time required.    Transfers Overall transfer level: Needs assistance Equipment used:  Rolling [place] (2 wheeled) Transfers: Sit to/from Stand Sit to Stand: Min assist         General transfer comment: minguardA to minA for safe transfer from sit to stand. Pt usign RW for stability x3 tries with momentum to come to standing and cues for hand placement each time    Balance Overall balance assessment: Needs assistance Sitting-balance support: No upper extremity supported;Feet supported Sitting balance-Leahy Scale: Good     Standing balance support: Bilateral upper extremity supported;During functional activity Standing balance-Leahy Scale: Poor Standing balance comment: Reliant on BUE support                           ADL either performed or assessed with clinical judgement   ADL Overall ADL's : Needs assistance/impaired Eating/Feeding: Set up;Sitting   Grooming: Min guard;Standing Grooming Details (indicate cue type and reason): slight LOB episode Upper Body Bathing: Set up;Sitting   Lower Body Bathing: Moderate assistance;Sitting/lateral leans;Sit to/from stand;Cueing for safety;Cueing for sequencing;Cueing for back precautions   Upper Body Dressing : Moderate assistance;Cueing for safety;Sitting;Standing Upper Body Dressing Details (indicate cue type and reason): assist for donning brace Lower Body Dressing: Maximal assistance;Sitting/lateral leans;Sit to/from stand;Cueing for safety;Cueing for back precautions Lower Body Dressing Details (indicate cue type and reason): pt unable to perform figure 4 technique at this time Toilet Transfer: Min guard;Ambulation;BSC;RW;Grab bars   Toileting- Clothing Manipulation and Hygiene: Moderate assistance;Cueing for safety;Sitting/lateral lean;Sit to/from stand Toileting - Clothing Manipulation Details (indicate cue type and reason): sitting can perform, but unable to stand without assist for posterior pericare     Functional mobility during ADLs: Minimal assistance;Rolling [place];Cueing for safety;Cueing for  sequencing General ADL Comments: Pt limited  by decreased strength, decreased ability to care for self safely and decreased mobility. Pt reports little pain. Pt requires cues for attending to tasks and frequent reminders to reduce tangential thoughts.     Vision Baseline Vision/History: No visual deficits Patient Visual Report: No change from baseline Vision Assessment?: No apparent visual deficits Additional Comments: continue to assess     Perception     Praxis      Pertinent Vitals/Pain Pain Assessment: Faces Faces Pain Scale: Hurts a little bit Pain Location: back Pain Descriptors / Indicators: Aching Pain Intervention(s): Monitored during session     Hand Dominance Right   Extremity/Trunk Assessment Upper Extremity Assessment Upper Extremity Assessment: Generalized weakness   Lower Extremity Assessment Lower Extremity Assessment: Overall WFL for tasks assessed   Cervical / Trunk Assessment Cervical / Trunk Assessment: Other exceptions Cervical / Trunk Exceptions: age indeterminate T12 fx   Communication Communication Communication: No difficulties   Cognition Arousal/Alertness: Awake/alert Behavior During Therapy: WFL for tasks assessed/performed Overall Cognitive Status: Impaired/Different from baseline Area of Impairment: Safety/judgement;Problem solving;Memory;Attention                   Current Attention Level: Sustained Memory: Decreased short-term memory;Decreased recall of precautions   Safety/Judgement: Decreased awareness of deficits;Decreased awareness of safety   Problem Solving: Slow processing General Comments: Pt with decreased sustained attention and inability to avoid distrations. Decreased awareness of safety or deficits.   General Comments  Donning brace and education for back precautions presumably.    Exercises     Shoulder Instructions      Home Living Family/patient expects to be discharged to:: Private residence Living  Arrangements: Alone Available Help at Discharge: Family;Available PRN/intermittently Type of Home: House Home Access: Stairs to enter [employer] of Steps: 4 Entrance Stairs-Rails: Right Home Layout: One level     Bathroom Shower/Tub: Teacher, early years/pre: Standard     Home Equipment: Environmental consultant - 2 wheels;Shower seat          Prior Functioning/Environment Level of Independence: Needs assistance  Gait / Transfers Assistance Needed: Pt reports using a RW but had falls even with RW. ADL's / Homemaking Assistance Needed: Pt usually performing own ADL as she lives alone; Reports needing help with transfers into and out of shower.            OT Problem List: Decreased strength;Decreased activity tolerance;Impaired balance (sitting and/or standing);Decreased safety awareness;Decreased knowledge of use of DME or AE;Decreased knowledge of precautions;Pain      OT Treatment/Interventions: Self-care/ADL training;Therapeutic exercise;Energy conservation;DME and/or AE instruction;Therapeutic activities;Cognitive remediation/compensation;Patient/family education;Balance training    OT Goals(Current goals can be found in the care plan section) Acute Rehab OT Goals Patient Stated Goal: to go home OT Goal Formulation: With patient Time For Goal Achievement: 11/16/20 Potential to Achieve Goals: Good ADL Goals Pt Will Perform Lower Body Dressing: with min guard assist;sitting/lateral leans;sit to/from stand;with adaptive equipment Pt Will Perform Toileting - Clothing Manipulation and hygiene: with supervision;sitting/lateral leans;sit to/from stand Additional ADL Goal #1: Pt will state 3/3 back precautions. Additional ADL Goal #2: Pt will perform x10 mins of OOB ADL tasks with supervisionA in order to  increase independence.  OT Frequency: Min 2X/week   Barriers to D/C:            Co-evaluation              AM-PAC OT "6 Clicks" Daily Activity     Outcome  Measure Help from another person eating  meals?: None Help from another person taking care of personal grooming?: A Little Help from another person toileting, which includes using toliet, bedpan, or urinal?: A Little Help from another person bathing (including washing, rinsing, drying)?: A Lot Help from another person to put on and taking off regular upper body clothing?: A Lot Help from another person to put on and taking off regular lower body clothing?: A Lot 6 Click Score: 16   End of Session Equipment Utilized During Treatment: Back brace;Rolling [place] Nurse Communication: Mobility status  Activity Tolerance: Patient limited by pain Patient left: in chair;with call bell/phone within reach;with chair alarm set  OT Visit Diagnosis: Unsteadiness on feet (R26.81);Muscle weakness (generalized) (M62.81);Pain;Other symptoms and signs involving cognitive function Pain - part of body:  (back)                Time: 1530-1610 OT Time Calculation (min): 40 min Charges:  OT General Charges $OT Visit: 1 Visit OT Evaluation $OT Eval Moderate Complexity: 1 Mod OT Treatments $Self Care/Home Management : 8-22 mins $Therapeutic Activity: 8-22 mins  Jefferey Pica, OTR/L Acute Rehabilitation Services Pager: [phone] Office: [phone]   Haasini Patnaude C 11/02/2020, 4:26 PM

## 2020-11-02 NOTE — Progress Notes (Signed)
Cardiac monitoring(TELE) applied and confirmed

## 2020-11-02 NOTE — ED Notes (Signed)
MS Breakfast Order Placed

## 2020-11-02 NOTE — H&P (Signed)
History and Physical    Anna Hunt FTD:[id number] DOB: 21-Oct-1945 DOA: 11/01/2020  PCP: Lawerance Cruel, MD   Patient coming from: Home.  I have personally briefly reviewed patient's old medical records in [place]  Chief Complaint: Fall.  HPI: Anna Hunt is a 75 y.o. female with medical history significant of breast cancer, type II DM, displacement of cervical intervertebral disc without myelopathy, depression, hypertension, LBBB, left occipital other nonhemorrhagic stroke, vertigo who is brought to the emergency department via EMS from home after sliding out of bed in the morning and hitting her head. She has also been having mild right flank pain, increased incontinence and has been using a diaper because of it.  No dysuria or hematuria.  She denies fever, chills, rhinorrhea, sore throat, wheezing or hemoptysis.  No chest pain, palpitations, diaphoresis, PND or orthopnea.  However, the patient gets frequent postural dizziness in the morning when getting out of bed.  She denies appetite changes, nausea, emesis, diarrhea, constipation, melena or hematochezia.  No polyuria, polydipsia, polyphagia or blurred vision.  She lives by herself and her daughter who is concerned about her safety. She feels that the patient may need nursing home placement.  ED Course: Initial vital signs were temperature 99.4 F, pulse 77, respiration 18, BP 151/77 mmHg O2 sat 99% on room air.  The patient received 1 g of Rocephin in the emergency department.  Labwork: Coronavirus PCR was negative.  Urinalysis was turbid with proteinuria 30 mg/dL, positive nitrites and leukocyte esterase.  Microscopic examination showed 21-50 RBC, more than 50 WBC and many bacteria with WBC clumps.  CBC showed a white count of 5.2, hemoglobin 11.3 g/dL and platelets 265.  CMP results had a glucose of 182 mg/dL and albumin of 3.3 g/dL.  All other CMP values were normal.    Imaging: Right hip x-ray did not show any acute  abnormality.  Lumbar spine complete show mild compression deformity of C62 of uncertain age.  MRI may be performed for further evaluation.  There is moderate DDD L5-S1.  CT head without contrast did not show any evidence of acute intracranial normality, but redemonstrated chronic left PCA, pons and right cerebellum infarcts.  CT cervical spine did not show any evidence of acute fracture.  There Zelicof spondylosis most noticeable C6-C7.  Please see images and full regular report for further detail.  Review of Systems: As per HPI otherwise all other systems reviewed and are negative.  Past Medical History:  Diagnosis Date  . Breast cancer ([place])   . Diabetes mellitus without complication ([place])   . Displacement of cervical intervertebral disc without myelopathy   . Family history of adverse reaction to anesthesia    per patient "younger sister was nauseated"  . History of depression   . Hypertension   . LBBB (left bundle branch block)   . Occipital stroke ([place]) - left   . Unspecified cerebral artery occlusion with cerebral infarction   . Vertigo    Past Surgical History:  Procedure Laterality Date  . BACK SURGERY    . BREAST LUMPECTOMY WITH RADIOACTIVE SEED LOCALIZATION Left 09/15/2020   Procedure: LEFT BREAST LUMPECTOMY WITH RADIOACTIVE SEED LOCALIZATION;  Surgeon: Rolm Bookbinder, MD;  Location: [facility];  Service: General;  Laterality: Left;  . CATARACT EXTRACTION W/ INTRAOCULAR LENS  IMPLANT, BILATERAL    . TUBAL LIGATION     Social History  reports that she has quit smoking. She has never used smokeless tobacco. She reports that  she does not drink alcohol and does not use drugs.  Allergies  Allergen Reactions  . Niaspan [Niacin Er] Hives and Swelling    welps   Family History  Problem Relation Age of Onset  . Heart attack Sister 35  . Breast cancer Sister 52  . Breast cancer Sister 26   Prior to Admission medications   Medication Sig Start Date End Date Taking? Authorizing  Provider  acetaminophen (TYLENOL) 500 MG tablet Take 1,000 mg by mouth every 6 (six) hours as needed for mild pain.   Yes [provider]  ALPRAZolam Duanne Moron) 0.5 MG tablet Take 0.5 mg by mouth at bedtime as needed for anxiety or sleep. 12/11/12  Yes [provider]  anastrozole (ARIMIDEX) 1 MG tablet Take 1 tablet (1 mg total) by mouth daily. 08/24/20  Yes Causey, Charlestine Massed, NP  BYETTA 10 MCG PEN 10 MCG/0.04ML SOPN Inject 10 mcg into the skin daily. 12/26/12  Yes [provider]  Cholecalciferol (VITAMIN D) 2000 UNITS CAPS Take 2,000 Units by mouth every morning.    Yes [provider]  clopidogrel (PLAVIX) 75 MG tablet Take 1 tablet (75 mg total) by mouth daily. 11/17/18  Yes Thurnell Lose, MD  divalproex (DEPAKOTE ER) 500 MG 24 hr tablet Take 1 tablet (500 mg total) by mouth at bedtime. 06/23/20  Yes Marcial Pacas, MD  metFORMIN (GLUCOPHAGE-XR) 500 MG 24 hr tablet Take 1,000 mg by mouth at bedtime. 01/05/13  Yes [provider]  Multiple Vitamin (MULTIVITAMIN WITH MINERALS) TABS tablet Take 1 tablet by mouth daily.   Yes [provider]  PARoxetine (PAXIL) 20 MG tablet Take 20 mg by mouth at bedtime.  01/05/13  Yes [provider]  simvastatin (ZOCOR) 40 MG tablet Take 40 mg by mouth at bedtime.  01/05/13  Yes [provider]  telmisartan (MICARDIS) 40 MG tablet Take 20 mg by mouth daily. 09/13/20  Yes [provider]  BD PEN NEEDLE NANO U/F 32G X 4 MM MISC  12/26/12   [provider]  oxybutynin (DITROPAN-XL) 5 MG 24 hr tablet Take 1 tablet (5 mg total) by mouth at bedtime. Patient not taking: Reported on 11/01/2020 10/11/20   Marcial Pacas, MD  pantoprazole (PROTONIX) 40 MG tablet Take 1 tablet (40 mg total) by mouth daily. Patient not taking: Reported on 11/01/2020 11/17/18   Thurnell Lose, MD    Physical Exam: Vitals:   11/01/20 1130 11/01/20 1354 11/01/20 1509 11/01/20 1823  BP: 134/70  (!) 158/77 (!)  178/74  Pulse: 76  78 70  Resp: 15  16 16   Temp:      TempSrc:      SpO2: 98%  98% 98%  Weight:  72.6 kg    Height:  5' 2.5" (1.588 m)      Constitutional: Looks chronically ill.  NAD, calm, comfortable Eyes: PERRL, lids and conjunctivae are injected.  Sclerae is injected. ENMT: Mucous membranes are moist. Posterior pharynx clear of any exudate or lesions. Neck: normal, supple, no masses, no thyromegaly Respiratory: Decreased breath sounds on bases, otherwise clear to auscultation bilaterally, no wheezing, no crackles. Normal respiratory effort. No accessory muscle use.  Cardiovascular: Regular rate and rhythm, no murmurs / rubs / gallops.  Stage I lymphedema.  Trace lower extremity pitting edema. 2+ pedal pulses. No carotid bruits.  Abdomen: No distention.  Bowel sounds positive.  Positive mild suprapubic and mild right flank tenderness, no guarding or rebound, no masses palpated. No hepatosplenomegaly. Musculoskeletal:  Moderate generalized weakness.  No clubbing / cyanosis.  Good ROM, no contractures. Normal muscle tone.  Skin: Multiple areas of ecchymosis on limbs, particularly in the lower extremities. Neurologic: CN 2-12 grossly intact. Sensation intact, DTR normal. Strength 5/5 in all 4.  Psychiatric: Normal judgment and insight. Alert and oriented x 3. Normal mood.   Labs on Admission: I have personally reviewed following labs and imaging studies  CBC: Recent Labs  Lab 11/01/20 1842  WBC 5.2  NEUTROABS 3.0  HGB 11.3*  HCT 35.4*  MCV 95.7  PLT 332    Basic Metabolic Panel: Recent Labs  Lab 11/01/20 1842  NA 138  K 4.2  CL 104  CO2 25  GLUCOSE 182*  BUN 14  CREATININE 0.95  CALCIUM 9.5    GFR: Estimated Creatinine Clearance: 49 mL/min (by C-G formula based on SCr of 0.95 mg/dL).  Liver Function Tests: Recent Labs  Lab 11/01/20 1842  AST 28  ALT 21  ALKPHOS 77  BILITOT 0.8  PROT 6.6  ALBUMIN 3.3*    Urine analysis:    Component Value Date/Time    COLORURINE YELLOW 11/01/2020 1404   APPEARANCEUR TURBID (A) 11/01/2020 1404   LABSPEC 1.011 11/01/2020 1404   PHURINE 6.0 11/01/2020 1404   GLUCOSEU NEGATIVE 11/01/2020 1404   HGBUR NEGATIVE 11/01/2020 1404   BILIRUBINUR NEGATIVE 11/01/2020 1404   KETONESUR NEGATIVE 11/01/2020 1404   PROTEINUR 30 (A) 11/01/2020 1404   UROBILINOGEN 1.0 06/01/2007 1926   NITRITE POSITIVE (A) 11/01/2020 1404   LEUKOCYTESUR LARGE (A) 11/01/2020 1404    Radiological Exams on Admission: DG Lumbar Spine Complete  Result Date: 11/01/2020 CLINICAL DATA:  Back pain after fall 2 weeks ago. EXAM: LUMBAR SPINE - COMPLETE 4+ VIEW COMPARISON:  None. FINDINGS: Mild compression deformity of T12 vertebral body is noted of uncertain age. Moderate degenerative disc disease is noted at L5-S1. Diffuse osteopenia is noted. IMPRESSION: Mild compression deformity of T12 vertebral body is noted of uncertain age. MRI may be performed for further evaluation. Moderate degenerative disc disease is noted at L5-S1. Aortic Atherosclerosis (ICD10-I70.0). Electronically Signed   By: Marijo Conception M.D.   On: 11/01/2020 15:32   CT Head Wo Contrast  Result Date: 11/01/2020 CLINICAL DATA:  Head trauma, minor.  Neck trauma. EXAM: CT HEAD WITHOUT CONTRAST CT CERVICAL SPINE WITHOUT CONTRAST TECHNIQUE: Multidetector CT imaging of the head and cervical spine was performed following the standard protocol without intravenous contrast. Multiplanar CT image reconstructions of the cervical spine were also generated. COMPARISON:  Brain MRI 06/22/2020. MRI/MRA head 11/17/2018. Cervical spine MRI 06/24/2020. FINDINGS: CT HEAD FINDINGS Brain: Mild cerebral and cerebellar atrophy. Redemonstrated large cortical/subcortical left PCA territory infarct within the left parietooccipital and temporal lobes. Associated chronic infarct within the left thalamus. Known chronic lacunar infarcts within the pons, some of which were better appreciated on the prior brain MRI of  06/22/2020. Known small infarcts within the right cerebellar hemisphere. Background moderate ill-defined hypoattenuation within the cerebral white matter is nonspecific, but compatible with chronic small vessel ischemic disease. There is no acute intracranial hemorrhage. No acute demarcated cortical infarct. No extra-axial fluid collection. No evidence of intracranial mass. No midline shift. Vascular: No hyperdense vessel.  Atherosclerotic calcifications. Skull: Normal. Negative for fracture or focal lesion. Sinuses/Orbits: Visualized orbits show no acute finding. No significant paranasal sinus disease at the imaged levels. CT CERVICAL SPINE FINDINGS Alignment: Cervicothoracic levocurvature. Straightening of the expected cervical lordosis. No significant spondylolisthesis. Skull base and vertebrae: The basion-dental and atlanto-dental  intervals are maintained.No evidence of acute fracture to the cervical spine. Soft tissues and spinal canal: No prevertebral fluid or swelling. No visible canal hematoma. Disc levels: Cervical spondylosis multilevel disc space narrowing, disc bulges, posterior disc osteophytes and uncovertebral hypertrophy. Notably, disc space narrowing is moderate/advanced at C5-C6. Also at C5-C6, a disc bulge and uncovertebral hypertrophy contribute to right neural foraminal narrowing and suspected mild spinal canal stenosis. At C6-C7, a left center cranially migrated disc extrusion contributes to suspected moderate/severe spinal canal stenosis. Ligamentum flavum calcification on the right at C4-C5. Upper chest: No consolidation within the imaged lung apices. No visible pneumothorax. IMPRESSION: CT head: 1. No evidence of acute intracranial abnormality. 2. Redemonstrated chronic left PCA territory infarcts. 3. Known chronic infarcts within the pons and right cerebellum. 4. Background mild generalized atrophy of the brain and moderate chronic small vessel ischemic disease, stable. CT cervical spine:  1. No evidence of acute fracture to the cervical spine. 2. Nonspecific straightening of the expected cervical lordosis. 3. Cervicothoracic levocurvature. 4. Cervical spondylosis as described. Most notably at C6-C7 a left center cranially migrated disc extrusion contributes to suspected moderate/severe spinal canal stenosis. Electronically Signed   By: Kellie Simmering DO   On: 11/01/2020 14:53   CT Cervical Spine Wo Contrast  Result Date: 11/01/2020 CLINICAL DATA:  Head trauma, minor.  Neck trauma. EXAM: CT HEAD WITHOUT CONTRAST CT CERVICAL SPINE WITHOUT CONTRAST TECHNIQUE: Multidetector CT imaging of the head and cervical spine was performed following the standard protocol without intravenous contrast. Multiplanar CT image reconstructions of the cervical spine were also generated. COMPARISON:  Brain MRI 06/22/2020. MRI/MRA head 11/17/2018. Cervical spine MRI 06/24/2020. FINDINGS: CT HEAD FINDINGS Brain: Mild cerebral and cerebellar atrophy. Redemonstrated large cortical/subcortical left PCA territory infarct within the left parietooccipital and temporal lobes. Associated chronic infarct within the left thalamus. Known chronic lacunar infarcts within the pons, some of which were better appreciated on the prior brain MRI of 06/22/2020. Known small infarcts within the right cerebellar hemisphere. Background moderate ill-defined hypoattenuation within the cerebral white matter is nonspecific, but compatible with chronic small vessel ischemic disease. There is no acute intracranial hemorrhage. No acute demarcated cortical infarct. No extra-axial fluid collection. No evidence of intracranial mass. No midline shift. Vascular: No hyperdense vessel.  Atherosclerotic calcifications. Skull: Normal. Negative for fracture or focal lesion. Sinuses/Orbits: Visualized orbits show no acute finding. No significant paranasal sinus disease at the imaged levels. CT CERVICAL SPINE FINDINGS Alignment: Cervicothoracic levocurvature.  Straightening of the expected cervical lordosis. No significant spondylolisthesis. Skull base and vertebrae: The basion-dental and atlanto-dental intervals are maintained.No evidence of acute fracture to the cervical spine. Soft tissues and spinal canal: No prevertebral fluid or swelling. No visible canal hematoma. Disc levels: Cervical spondylosis multilevel disc space narrowing, disc bulges, posterior disc osteophytes and uncovertebral hypertrophy. Notably, disc space narrowing is moderate/advanced at C5-C6. Also at C5-C6, a disc bulge and uncovertebral hypertrophy contribute to right neural foraminal narrowing and suspected mild spinal canal stenosis. At C6-C7, a left center cranially migrated disc extrusion contributes to suspected moderate/severe spinal canal stenosis. Ligamentum flavum calcification on the right at C4-C5. Upper chest: No consolidation within the imaged lung apices. No visible pneumothorax. IMPRESSION: CT head: 1. No evidence of acute intracranial abnormality. 2. Redemonstrated chronic left PCA territory infarcts. 3. Known chronic infarcts within the pons and right cerebellum. 4. Background mild generalized atrophy of the brain and moderate chronic small vessel ischemic disease, stable. CT cervical spine: 1. No evidence of acute fracture to the  cervical spine. 2. Nonspecific straightening of the expected cervical lordosis. 3. Cervicothoracic levocurvature. 4. Cervical spondylosis as described. Most notably at C6-C7 a left center cranially migrated disc extrusion contributes to suspected moderate/severe spinal canal stenosis. Electronically Signed   By: Kellie Simmering DO   On: 11/01/2020 14:53   DG HIP UNILAT WITH PELVIS 2-3 VIEWS RIGHT  Result Date: 11/01/2020 CLINICAL DATA:  Right hip pain after fall. EXAM: DG HIP (WITH OR WITHOUT PELVIS) 2-3V RIGHT COMPARISON:  None. FINDINGS: There is no evidence of hip fracture or dislocation. Mild osteophyte formation is noted. IMPRESSION: Mild  degenerative joint disease is noted. No acute abnormality is noted. Electronically Signed   By: Marijo Conception M.D.   On: 11/01/2020 15:29    EKG: Independently reviewed.  Assessment/Plan Principal Problem:   Adult failure to thrive Having multiple falls at home. Showing signs of malnutrition. Consult dietitian. Consult PT and OT Consult TOC.  Active Problems:   Acute UTI (urinary tract infection) Continue ceftriaxone 1 g IVPB daily.    HTN (hypertension), benign Not on antihypertensive. Single dose metoprolol given earlier. Begin amlodipine 2.5 mg p.o. daily.    Type 2 diabetes mellitus with hyperglycemia (HCC) Carbohydrate modified diet. Continue metformin 1000 mg p.o. bedtime.    Normocytic anemia Check anemia panel. Monitor hematocrit and hemoglobin.    Mild protein-calorie malnutrition ([place]) Mild to moderate generalized weakness with falls at home. Has trace lower extremity pitting edema. Protein supplementation Consult nutritional services.    DVT prophylaxis: Lovenox SQ. Code Status:   Full code. Family Communication: Disposition Plan:   Patient is from:  Home.  Anticipated DC to:  Home.  Anticipated DC date:  11/03/2020 or 11/04/2020.Marland Kitchen  Anticipated DC barriers: Clinical status/discharge planning.  Consults called:  TOC team. Admission status:  Observation/telemetry.  Severity of Illness:  High due to having frequent falls and related injuries.  The patient will need to be treated for UTI.  She will be evaluated by physical therapy.  She may need placement at a long-term care facility due to risk of injuries at home.  Reubin Milan MD [facility]  How to contact the [facility] Attending or Consulting provider [place] or covering provider during after hours [place], for this patient?   1. Check the care team in [facility] and look for a) attending/consulting [facility] provider listed and b) the [facility] team listed 2. Log into [email or web address] and use [place]'s  universal password to access. If you do not have the password, please contact the hospital operator. 3. Locate the [facility] provider you are looking for under [facility] and page to a number that you can be directly reached. 4. If you still have difficulty reaching the provider, please page the [facility] (Director on Call) for the Hospitalists listed on amion for assistance.  11/02/2020, 1:55 AM   This document was prepared using Dragon voice recognition software and may contain some unintended transcription errors.

## 2020-11-02 NOTE — Progress Notes (Signed)
Orthopedic Tech Progress Note Patient Details:  Anna Hunt Feb 19, 1946 [id number] Called in order to [facility] for a [facility] Patient ID: Anna Hunt, female   DOB: [birth year], 75 y.o.   MRN: [id number]   [name] 11/02/2020, 8:20 AM

## 2020-11-02 NOTE — Progress Notes (Signed)
Anna Hunt is a 75 y.o. female with medical history significant of breast cancer, type II DM, displacement of cervical intervertebral disc without myelopathy, depression, hypertension, LBBB, left occipital other nonhemorrhagic stroke, vertigo who is brought to the emergency department via EMS from home after sliding out of bed in the morning and hitting her head. She has also been having mild right flank pain, increased incontinence and has been using a diaper because of it.  No dysuria or hematuria.  She denies fever, chills, rhinorrhea, sore throat, wheezing or hemoptysis.  No chest pain, palpitations, diaphoresis, PND or orthopnea.  However, the patient gets frequent postural dizziness in the morning when getting out of bed.  She denies appetite changes, nausea, emesis, diarrhea, constipation, melena or hematochezia.  No polyuria, polydipsia, polyphagia or blurred vision.  She lives by herself and her daughter who is concerned about her safety. She feels that the patient may need nursing home placement.  11/02/20: Seen and examined at her bedside in the ED.  She reports she has been falling a lot.  Bruising noted in her lower and upper extremities bilaterally.  Urine analysis positive for pyuria, she is currently on Rocephin empirically.  PT OT consulted to assess her mobility.  Please refer to H&P dictated by my partner Dr. Olevia Bowens on 11/02/2020 for further details of the assessment and plan.

## 2020-11-02 NOTE — ED Notes (Signed)
Pt provided hygiene care, fresh linens and placed on pure wick

## 2020-11-03 DIAGNOSIS — R627 Adult failure to thrive: Secondary | ICD-10-CM | POA: Diagnosis not present

## 2020-11-03 MED ORDER — ADULT MULTIVITAMIN W/MINERALS CH
1.0000 | ORAL_TABLET | Freq: Every day | ORAL | Status: DC
  Administered 2020-11-03 – 2020-11-05 (×3): 1 via ORAL
  Filled 2020-11-03 (×3): qty 1

## 2020-11-03 MED ORDER — ENSURE ENLIVE PO LIQD
237.0000 mL | Freq: Two times a day (BID) | ORAL | Status: DC
  Administered 2020-11-03 – 2020-11-05 (×4): 237 mL via ORAL

## 2020-11-03 NOTE — TOC Initial Note (Signed)
Transition of Care [facility]) - Initial/Assessment Note    Patient Details  Name: Anna Hunt MRN: [id number] Date of Birth: 13-May-1946  Transition [facility]) CM/SW Contact:    Emeterio Reeve, [place] Phone Number: 11/03/2020, 4:30 PM  Clinical Narrative:                  CSW spoke to pts daughter Caryl Asp on the phone. Joy states PTA pt was living at home alone. Joy states pt was mobile and able to complete all ADLs until about November of last year. Joy states since November pt has had multiple falls and has required help with daily tasks. Joy states her and her sister have been rotating days to stay with her because they feared she would fall due to her confusion and hurt herself.   CSW reviewed pt/ot reccs of SNF. CSW gave four bed offers. Joy states she will review them with her family tonight. Pt is not covid vaccinated.    Expected Discharge Plan: Skilled Nursing Facility Barriers to Discharge: Continued Medical Work up   Patient Goals and CMS Choice Patient states their goals for this hospitalization and ongoing recovery are:: not to stumble and fall CMS Medicare.gov Compare Post Acute Care list provided to:: Patient Choice offered to / list presented to : [place]  Expected Discharge Plan and Services Expected Discharge Plan: [place] arrangements for the past 2 months: Single Family Home                                      Prior Living Arrangements/Services Living arrangements for the past 2 months: Single Family Home Lives with:: Self Patient language and need for interpreter reviewed:: No (not needed) Do you feel safe going back to the place where you live?: No   Multiple falls  Need for Family Participation in Patient Care: Yes (Comment) Care giver support system in place?: Yes (comment) Frary, Joy Daughter   [phone]) Current home services: DME Criminal Activity/Legal Involvement Pertinent to Current  Situation/Hospitalization: No - Comment as needed  Activities of Daily Living      Permission Sought/Granted Permission sought to share information with : Facility Art therapist granted to share information with : Yes, Verbal Permission Granted  Share Information with NAME: Miette, [place] Daughter   [phone]     Permission granted to share info w Relationship: Jalina, [place] Daughter   [phone]  Permission granted to share info w Contact Information: Aimee, [place] Daughter   [phone]  Emotional Assessment Appearance:: Appears stated age Attitude/Demeanor/Rapport: Engaged Affect (typically observed): Frustrated Orientation: : Oriented to Self,Oriented to Place,Oriented to  Time,Oriented to Situation Alcohol / Substance Use: Not Applicable Psych Involvement: No (comment)  Admission diagnosis:  Adult failure to thrive [R62.7] Fall [W19.XXXA] Acute cystitis without hematuria [N30.00] Fall, initial encounter [W19.XXXA] Compression fracture of T12 vertebra, initial encounter ([place]) [S22.080A] Superficial bruising of back, right, initial encounter [S20.221A] Failure to thrive in adult [R62.7] Patient Active Problem List   Diagnosis Date Noted  . Adult failure to thrive 11/02/2020  . Type 2 diabetes mellitus with hyperglycemia ([place]) 11/02/2020  . Normocytic anemia 11/02/2020  . Acute UTI (urinary tract infection) 11/02/2020  . Mild protein-calorie malnutrition ([place]) 11/02/2020  . Failure to thrive in adult 11/02/2020  . Urinary incontinence 10/11/2020  . Cervical myelopathy ([place]) 06/24/2020  . Genetic testing 06/23/2020  .  Cerebrovascular accident (CVA) ([place]) 06/23/2020  . Gait abnormality 06/16/2020  . Malignant neoplasm of upper-inner quadrant of left breast in female, estrogen receptor positive ([place]) 06/10/2020  . TIA (transient ischemic attack) 11/16/2018  . HTN (hypertension), benign 11/16/2018  . LBBB (left bundle branch block)   . Diabetes mellitus  without complication ([place])   . Stroke ([place]) 01/22/2013  . Displacement of cervical intervertebral disc without myelopathy    PCP:  Lawerance Cruel, MD Pharmacy:   [id number] [place], [place] [address] [place] [address] Phone: [phone] Fax: [phone]     Social Determinants of Health (SDOH) Interventions    Readmission Risk Interventions No flowsheet data found.  Emeterio Reeve, Latanya Presser, [place] Social Worker [phone]

## 2020-11-03 NOTE — Progress Notes (Signed)
Initial Nutrition Assessment  DOCUMENTATION CODES:  Non-severe (moderate) malnutrition in context of social or environmental circumstances  INTERVENTION:  Ensure Enlive po BID, each supplement provides 350 kcal and 20 grams of protein.  Add MVI with minerals daily.  NUTRITION DIAGNOSIS:  Moderate Malnutrition related to social / environmental circumstances as evidenced by per patient/family report,mild fat depletion,mild muscle depletion.  GOAL:  Patient will meet greater than or equal to 90% of their needs  MONITOR:  PO intake,Supplement acceptance  REASON FOR ASSESSMENT:  Consult Calorie Count  ASSESSMENT:  75 yo female with a PMH of breast CA, T2DM, displacement of cervical intervertebral disc without myelopathy, depression, hypertension, LBBB, left occipital other nonhemorrhagic stroke, and vertigo who presents with a fall out of bed and hitting her head. Dx with adult FTT and acute UTI.   Per Epic documentation, ate 50% of lunch on 2/23. Per MD note, the daughter is concerned for her mother and wants her to consider a nursing home d/t her progressing weakness and her hx of falls.  Spoke with pt at bedside. She seemed a little confused when we spoke, calling her meal "dinner." She mentioned she had lost 30 lbs over the past year, however Epic documents stable weight over the last year. She reports having a lot of loose skin around her arms and legs, which were felt on exam. When asked about meals at home, she reports eating 2 meals a day, mostly meat, like chicken, chicken salad, and salmon. She mentioned that those are what she really enjoys.  Encouraged intake of Ensure Enlive and her meals to increase her caloric and protein intake.  Relevant Medications: Depakote, metformin 1000 mg, Protonix Labs: reviewed; Glucose 182, Hemoglobin 11.3  NUTRITION - FOCUSED PHYSICAL EXAM: Flowsheet Row Most Recent Value  Orbital Region Mild depletion  Upper Arm Region Moderate depletion   Thoracic and Lumbar Region Unable to assess  [pt wearing wrap-around back brace]  Buccal Region No depletion  Temple Region Mild depletion  Clavicle Bone Region Mild depletion  Clavicle and Acromion Bone Region Moderate depletion  Scapular Bone Region Mild depletion  Dorsal Hand No depletion  Patellar Region Mild depletion  Anterior Thigh Region No depletion  Posterior Calf Region Mild depletion  Edema (RD Assessment) Mild  Hair Reviewed  Eyes Reviewed  [pale conjunctiva]  Mouth Reviewed  [wears dentures,  no original dentition left]  Skin Reviewed  Nails Reviewed     Diet Order:   Diet Order            Diet regular Room service appropriate? Yes; Fluid consistency: Thin  Diet effective now                EDUCATION NEEDS:  Education needs have been addressed  Skin:  Skin Assessment: Reviewed RN Assessment  Last BM:  11/03/20  Height:  Ht Readings from Last 1 Encounters:  11/01/20 5' 2.5" (1.588 m)   Weight:  Wt Readings from Last 1 Encounters:  11/01/20 72.6 kg   Ideal Body Weight:  52.3 kg  BMI:  Body mass index is 28.81 kg/m.  Estimated Nutritional Needs:  Kcal:  1600-1800 Protein:  80-95 grams Fluid:  >1.6 L  Derrel Nip, RD, LDN Registered Dietitian After Hours/Weekend Pager # in [place]

## 2020-11-03 NOTE — Progress Notes (Signed)
PROGRESS NOTE  Anna Hunt:[id number] DOB: 1945/12/16 DOA: 11/01/2020 PCP: Lawerance Cruel, MD  HPI/Recap of past 24 hours: Anna Hunt a 75 y.o.femalewith medical history significant ofbreast cancer, type II DM, displacement of cervical intervertebral disc without myelopathy, depression, hypertension, LBBB, left occipital other nonhemorrhagic stroke, vertigo who is brought to the emergency department via EMS from home after sliding out of bed in the morning and hitting her head. She has also been havingmild right flank pain,increased incontinence and has been using a diaperbecause of it.No dysuria or hematuria. She lives by herself and her daughterwhois concerned about her safety. She feels that the patient may need nursing home placement.  Urine analysis positive for pyuria, she is currently on Rocephin empirically.  11/03/20:  Seen and examined she is sitting in a chair.  She has no new complaints.  She is currently being treated for UTI.  Urine culture positive for greater than 100,000 colonies of gram-negative rods.  She is on Rocephin.  Assessment/Plan: Principal Problem:   Adult failure to thrive Active Problems:   HTN (hypertension), benign   Type 2 diabetes mellitus with hyperglycemia (HCC)   Normocytic anemia   Acute UTI (urinary tract infection)   Mild protein-calorie malnutrition (HCC)   Failure to thrive in adult  Failure to thrive in an adult Patient lives alone she has not been able to care for herself Her daughter is concerned about her safety, she feels that the patient may need nursing home placement.  Gram-negative rods UTI, POA UA positive for pyuria on presentation Urine culture grew greater than 100,000 colonies of gram-negative rods She was started on Rocephin empirically on 11/02/2020, continue. Continue to follow urine culture for ID and sensitivities. She is currently afebrile with no leukocytosis  Physical debility PT OT to  assess Fall precautions.  Moderate protein calorie malnutrition Albumin 3.3 Moderate muscle mass loss Encourage increase in oral protein calorie intake.  Type 2 diabetes with hyperglycemia Obtain A1c She is on Metformin at home  Breast cancer She is on anastrozole   Code Status: Full code.  Family Communication: None at bedside.  Disposition Plan: Likely will discharge to SNF on 11/04/2020 when bed is available.   Consultants:  None.  Procedures:  None.  Antimicrobials:  Rocephin.  DVT prophylaxis: Subcu Lovenox daily  Status is: Inpatient    Dispo:  Patient From: Home  Planned Disposition: [place]  Medically stable for discharge: No, ongoing management of UTI.          Objective: Vitals:   11/02/20 1956 11/02/20 2339 11/03/20 0403 11/03/20 1043  BP: 127/66 (!) 155/80 122/71 124/69  Pulse: 88 86 85 77  Resp: 18 20 16    Temp: 99 F (37.2 C) 98.7 F (37.1 C) 98.3 F (36.8 C) 98.7 F (37.1 C)  TempSrc: Oral Oral Oral Oral  SpO2: 96% 93% 98% 94%  Weight:      Height:        Intake/Output Summary (Last 24 hours) at 11/03/2020 1142 Last data filed at 11/03/2020 0710 Gross per 24 hour  Intake 480 ml  Output 200 ml  Net 280 ml   Filed Weights   11/01/20 1354  Weight: 72.6 kg    Exam:   General: 75 y.o. year-old female well developed well nourished in no acute distress.  Alert and interactive.  Cardiovascular: Regular rate and rhythm with no rubs or gallops.  No thyromegaly or JVD noted.    Respiratory: Clear to auscultation  with no wheezes or rales. Good inspiratory effort.  Abdomen: Soft nontender nondistended with normal bowel sounds x4 quadrants.  Musculoskeletal: Trace lower extremity edema bilaterally.  Skin: No ulcerative lesions noted or rashes  Psychiatry: Mood is appropriate for condition and setting   Data Reviewed: CBC: Recent Labs  Lab 11/01/20 1842 11/02/20 0700  WBC 5.2 5.8  NEUTROABS 3.0   --   HGB 11.3* 12.1  HCT 35.4* 37.3  MCV 95.7 94.2  PLT 265 976   Basic Metabolic Panel: Recent Labs  Lab 11/01/20 1842  NA 138  K 4.2  CL 104  CO2 25  GLUCOSE 182*  BUN 14  CREATININE 0.95  CALCIUM 9.5   GFR: Estimated Creatinine Clearance: 49 mL/min (by C-G formula based on SCr of 0.95 mg/dL). Liver Function Tests: Recent Labs  Lab 11/01/20 1842  AST 28  ALT 21  ALKPHOS 77  BILITOT 0.8  PROT 6.6  ALBUMIN 3.3*   No results for input(s): LIPASE, AMYLASE in the last 168 hours. No results for input(s): AMMONIA in the last 168 hours. Coagulation Profile: No results for input(s): INR, PROTIME in the last 168 hours. Cardiac Enzymes: No results for input(s): CKTOTAL, CKMB, CKMBINDEX, TROPONINI in the last 168 hours. BNP (last 3 results) No results for input(s): PROBNP in the last 8760 hours. HbA1C: No results for input(s): HGBA1C in the last 72 hours. CBG: Recent Labs  Lab 11/02/20 2111  GLUCAP 156*   Lipid Profile: No results for input(s): CHOL, HDL, LDLCALC, TRIG, CHOLHDL, LDLDIRECT in the last 72 hours. Thyroid Function Tests: No results for input(s): TSH, T4TOTAL, FREET4, T3FREE, THYROIDAB in the last 72 hours. Anemia Panel: Recent Labs    11/02/20 0700 11/02/20 1156  VITAMINB12 960*  --   FOLATE  --  42.4  FERRITIN 67  --   TIBC 430  --   IRON 60  --   RETICCTPCT  --  1.9   Urine analysis:    Component Value Date/Time   COLORURINE YELLOW 11/01/2020 1404   APPEARANCEUR TURBID (A) 11/01/2020 1404   LABSPEC 1.011 11/01/2020 1404   PHURINE 6.0 11/01/2020 1404   GLUCOSEU NEGATIVE 11/01/2020 1404   HGBUR NEGATIVE 11/01/2020 1404   BILIRUBINUR NEGATIVE 11/01/2020 1404   KETONESUR NEGATIVE 11/01/2020 1404   PROTEINUR 30 (A) 11/01/2020 1404   UROBILINOGEN 1.0 06/01/2007 1926   NITRITE POSITIVE (A) 11/01/2020 1404   LEUKOCYTESUR LARGE (A) 11/01/2020 1404   Sepsis Labs: @LABRCNTIP (procalcitonin:4,lacticidven:4)  ) Recent Results (from the past  240 hour(s))  Culture, Urine     Status: Abnormal (Preliminary result)   Collection Time: 11/02/20  1:30 AM   Specimen: Urine, Random  Result Value Ref Range Status   Specimen Description URINE, RANDOM  Final   Special Requests NONE  Final   Culture (A)  Final    >=100,000 COLONIES/mL GRAM NEGATIVE RODS SUSCEPTIBILITIES TO FOLLOW Performed at [facility], [place] [address], [place], [place] [address]    Report Status PENDING  Incomplete  Resp Panel by RT-PCR (Flu A&B, Covid) Nasopharyngeal Swab     Status: None   Collection Time: 11/02/20  2:45 AM   Specimen: Nasopharyngeal Swab; Nasopharyngeal(NP) swabs in vial transport medium  Result Value Ref Range Status   SARS Coronavirus 2 by RT PCR NEGATIVE NEGATIVE Final    Comment: (NOTE) SARS-CoV-2 target nucleic acids are NOT DETECTED.  The SARS-CoV-2 RNA is generally detectable in upper respiratory specimens during the acute phase of infection. The lowest concentration of  SARS-CoV-2 viral copies this assay can detect is 138 copies/mL. A negative result does not preclude SARS-Cov-2 infection and should not be used as the sole basis for treatment or other patient management decisions. A negative result may occur with  improper specimen collection/handling, submission of specimen other than nasopharyngeal swab, presence of viral mutation(s) within the areas targeted by this assay, and inadequate number of viral copies(<138 copies/mL). A negative result must be combined with clinical observations, patient history, and epidemiological information. The expected result is Negative.  Fact Sheet for Patients:  EntrepreneurPulse.com.au  Fact Sheet for Healthcare Providers:  IncredibleEmployment.be  This test is no t yet approved or cleared by the [place] FDA and  has been authorized for detection and/or diagnosis of SARS-CoV-2 by FDA under an Emergency Use Authorization (EUA). This EUA will  remain  in effect (meaning this test can be used) for the duration of the COVID-19 declaration under Section 564(b)(1) of the Act, 21 U.S.C.section 360bbb-3(b)(1), unless the authorization is terminated  or revoked sooner.       Influenza A by PCR NEGATIVE NEGATIVE Final   Influenza B by PCR NEGATIVE NEGATIVE Final    Comment: (NOTE) The Xpert Xpress SARS-CoV-2/FLU/RSV plus assay is intended as an aid in the diagnosis of influenza from Nasopharyngeal swab specimens and should not be used as a sole basis for treatment. Nasal washings and aspirates are unacceptable for Xpert Xpress SARS-CoV-2/FLU/RSV testing.  Fact Sheet for Patients: EntrepreneurPulse.com.au  Fact Sheet for Healthcare Providers: IncredibleEmployment.be  This test is not yet approved or cleared by the [place] FDA and has been authorized for detection and/or diagnosis of SARS-CoV-2 by FDA under an Emergency Use Authorization (EUA). This EUA will remain in effect (meaning this test can be used) for the duration of the COVID-19 declaration under Section 564(b)(1) of the Act, 21 U.S.C. section 360bbb-3(b)(1), unless the authorization is terminated or revoked.  Performed at [facility], [place] [address], [place], [place] [address]       Studies: No results found.  Scheduled Meds:  anastrozole  1 mg Oral Daily   clopidogrel  75 mg Oral Daily   divalproex  500 mg Oral Daily   enoxaparin (LOVENOX) injection  40 mg Subcutaneous Q24H   metFORMIN  1,000 mg Oral QHS   pantoprazole  40 mg Oral Daily   PARoxetine  20 mg Oral QHS   simvastatin  40 mg Oral QHS    Continuous Infusions:  cefTRIAXone (ROCEPHIN)  IV 1 g (11/02/20 2355)     LOS: 1 day     Kayleen Memos, MD [facility] Pager [phone]  If 7PM-7AM, please contact night-coverage [email or web address] Password [facility] 11/03/2020, 11:42 AM

## 2020-11-03 NOTE — Progress Notes (Signed)
Patient daughter called me requesting update. She is concerned about patient safety. Informed her that patient is sitting up in the chair with alarm on. Informed her that patient did  attempt to get up without calling and needing to be reminded to use the call bell several times.[place] reassured that safety mechanisms are in place.

## 2020-11-04 DIAGNOSIS — R627 Adult failure to thrive: Secondary | ICD-10-CM | POA: Diagnosis not present

## 2020-11-04 LAB — URINE CULTURE: Culture: >=100000 — AB

## 2020-11-04 LAB — BASIC METABOLIC PANEL
Anion gap: 14 (ref 5–15)
BUN: 19 mg/dL (ref 8–23)
CO2: 22 mmol/L (ref 22–32)
Calcium: 9.4 mg/dL (ref 8.9–10.3)
Chloride: 102 mmol/L (ref 98–111)
Creatinine, Ser: 0.86 mg/dL (ref 0.44–1.00)
GFR, Estimated: >60 mL/min (ref >60)
Glucose, Bld: 161 mg/dL — ABNORMAL HIGH (ref 70–99)
Potassium: 4.2 mmol/L (ref 3.5–5.1)
Sodium: 138 mmol/L (ref 135–145)

## 2020-11-04 LAB — SARS CORONAVIRUS 2 (TAT 6-24 HRS): SARS Coronavirus 2: NEGATIVE

## 2020-11-04 MED ORDER — SACCHAROMYCES BOULARDII 250 MG PO CAPS: 250.0000 mg | ORAL_CAPSULE | Freq: Two times a day (BID) | ORAL | 0 refills | Status: AC

## 2020-11-04 MED ORDER — IRBESARTAN 75 MG PO TABS
75.0000 mg | ORAL_TABLET | Freq: Every day | ORAL | Status: DC
  Administered 2020-11-04 – 2020-11-05 (×2): 75 mg via ORAL
  Filled 2020-11-04 (×2): qty 1

## 2020-11-04 MED ORDER — SACCHAROMYCES BOULARDII 250 MG PO CAPS
250.0000 mg | ORAL_CAPSULE | Freq: Two times a day (BID) | ORAL | Status: DC
  Administered 2020-11-04 – 2020-11-05 (×3): 250 mg via ORAL
  Filled 2020-11-04 (×3): qty 1

## 2020-11-04 MED ORDER — VITAMIN D 25 MCG (1000 UNIT) PO TABS
2000.0000 [IU] | ORAL_TABLET | ORAL | Status: DC
  Administered 2020-11-04 – 2020-11-05 (×2): 2000 [IU] via ORAL
  Filled 2020-11-04 (×2): qty 2

## 2020-11-04 MED ORDER — CEPHALEXIN 500 MG PO CAPS
500.0000 mg | ORAL_CAPSULE | Freq: Three times a day (TID) | ORAL | Status: DC
  Administered 2020-11-04 – 2020-11-05 (×3): 500 mg via ORAL
  Filled 2020-11-04 (×3): qty 1

## 2020-11-04 MED ORDER — ENSURE ENLIVE PO LIQD: 237.0000 mL | Freq: Two times a day (BID) | ORAL | 0 refills | Status: AC

## 2020-11-04 MED ORDER — CEPHALEXIN 500 MG PO CAPS: 500.0000 mg | ORAL_CAPSULE | Freq: Three times a day (TID) | ORAL | 0 refills | Status: AC

## 2020-11-04 MED ORDER — ALPRAZOLAM 0.5 MG PO TABS: 0.5000 mg | ORAL_TABLET | Freq: Every evening | ORAL | 0 refills | Status: DC | PRN

## 2020-11-04 NOTE — Discharge Summary (Addendum)
Discharge Summary  Anna Hunt JKK:[id number] DOB: [birth year]  PCP: Lawerance Cruel, MD  Admit date: 11/01/2020 Discharge date: 11/05/2020  Time spent: 35 minutes.  Recommendations for Outpatient Follow-up:  1. Follow-up with your neurosurgeon, Dr. Ellene Route.  An appointment has been made for you on 11/18/2020 at 11:30 AM. 2. Follow-up with your primary care provider in 1 to 2 weeks. 3. Follow-up with your medical oncologist in 1 to 2 weeks. 4. Continue PT OT with assistance and fall precautions. 5. Take your medications as prescribed.  Discharge Diagnoses:  Active Hospital Problems   Diagnosis Date Noted  . Adult failure to thrive 11/02/2020  . Malnutrition of moderate degree 11/05/2020  . Type 2 diabetes mellitus with hyperglycemia ([name]) 11/02/2020  . Normocytic anemia 11/02/2020  . Acute UTI (urinary tract infection) 11/02/2020  . Mild protein-calorie malnutrition ([name]) 11/02/2020  . Failure to thrive in adult 11/02/2020  . HTN (hypertension), benign 11/16/2018    Resolved Hospital Problems  No resolved problems to display.    Discharge Condition: Stable.  Diet recommendation: Resume previous diet.  Vitals:   11/05/20 0033 11/05/20 0423  BP: (!) 153/87 (!) 157/89  Pulse: 84 82  Resp: 18 19  Temp: 99.1 F (37.3 C) 98.6 F (37 C)  SpO2: 95% 95%    History of present illness:  Anna Hunt a 75 y.o.femalewith medical history significant ofbreast cancer on Arimidex, type II DM, displacement of cervical intervertebral disc without myelopathy, depression, hypertension, LBBB, left occipital lobe ischemic infarct, chronic lacunar infarct of left pons, vertigo who is brought to the emergency department from home via EMS after sliding out of bed in the morning and hitting her head. She has also been havingmild right flank pain,increased incontinence and has been using a diaperbecause of it.She lives by herself and her daughterwhois concerned about her safety  at home feels that the patient may need nursing home placement. Work-up revealed E. coli UTI, initially treated with Rocephin, switched to p.o. Keflex 500 mg 3 times daily on 11/04/2020.  She was seen by PT OT with recommendation for SNF.  TOC assisting with SNF placement.  11/04/20:  Seen at her bedside.  There were no acute events overnight.  She has no new complaints.     Hospital Course:  Principal Problem:   Adult failure to thrive Active Problems:   HTN (hypertension), benign   Type 2 diabetes mellitus with hyperglycemia ([facility])   Normocytic anemia   Acute UTI (urinary tract infection)   Mild protein-calorie malnutrition ([facility])   Failure to thrive in adult   Malnutrition of moderate degree  Failure to thrive in an adult Patient lives alone She has not been able to care for herself She was seen by PT OT with recommendation for SNF. She was also seen by a dietitian for a diagnosis of moderate malnutrition and is currently on oral supplement, Ensure Enlive. Encouraged to increase her protein calorie intake.  E. coli UTI, POA, pansensitive UA positive for pyuria on presentation Urine culture grew greater than 100,000 colonies of E. coli, pansensitive. She was started on Rocephin empirically on 11/02/2020, stopped on 11/04/2020 and switch to oral Keflex 500 mg 3 times daily x7 days.. Follow-up with your PCP in 1 to 2 weeks.  Cervical spondylosis with multilevel disc space narrowing and disc bulges. Moderate disc space narrowing at C5-C6 C6-C7 left central cranially migrated disc extrusion contributes to suspected moderate/severe spinal canal stenosis Ligamentum flavum calcification on the right at C4-C5. Follow-up with  neurosurgery in 1 to 2 weeks.  Physical debility PT OT assessment recommended SNF. Continue PT OT with assistance and fall precautions.  Moderate protein calorie malnutrition Albumin 3.3 Moderate muscle mass loss Continue to encourage to increase oral  protein calorie intake. Continue oral supplement.  Type 2 diabetes with hyperglycemia Last hemoglobin A1c 5.4 on 09/07/2020. Continue home regimen.  Breast cancer Continue home anastrozole Follow-up with your medical oncologist in 1 to 2 weeks.  patient seen, stable for discharge to SNF  Code Status: Full code.  Family Communication:  Updated her daughter via phone on 11/04/2020.    Consultants:  None.  Procedures:  None.  Antimicrobials:  Rocephin from 11/02/2020-11/04/2020  Keflex started on 11/04/2020   Discharge Exam: BP (!) 157/89 (BP Location: Right Arm)   Pulse 82   Temp 98.6 F (37 C) (Oral)   Resp 19   Ht 5' 2.5" (1.588 m)   Wt 72.6 kg   SpO2 95%   BMI 28.81 kg/m  . General: 75 y.o. year-old female frail-appearing in no acute distress.  Alert and oriented x3. . Cardiovascular: Regular rate and rhythm with no rubs or gallops.   Marland Kitchen Respiratory: Clear to auscultation with no wheezes or rales. . Abdomen: Soft nontender nondistended with normal bowel sounds present.   . Musculoskeletal: No lower extremity edema bilaterally. Marland Kitchen Psychiatry: Mood is appropriate for condition and setting  Discharge Instructions You were cared for by a hospitalist during your hospital stay. If you have any questions about your discharge medications or the care you received while you were in the hospital after you are discharged, you can call the unit and asked to speak with the hospitalist on call if the hospitalist that took care of you is not available. Once you are discharged, your primary care physician will handle any further medical issues. Please note that NO REFILLS for any discharge medications will be authorized once you are discharged, as it is imperative that you return to your primary care physician (or establish a relationship with a primary care physician if you do not have one) for your aftercare needs so that they can reassess your need for medications and  monitor your lab values.   Allergies as of 11/05/2020      Reactions   Niaspan [niacin Er] Hives, Swelling   welps      Medication List    STOP taking these medications   Byetta 10 MCG Pen 10 MCG/0.04ML Sopn injection Generic drug: exenatide   oxybutynin 5 MG 24 hr tablet Commonly known as: DITROPAN-XL   pantoprazole 40 MG tablet Commonly known as: PROTONIX     TAKE these medications   acetaminophen 500 MG tablet Commonly known as: TYLENOL Take 1,000 mg by mouth every 6 (six) hours as needed for mild pain.   ALPRAZolam 0.5 MG tablet Commonly known as: XANAX Take 1 tablet (0.5 mg total) by mouth at bedtime as needed for up to 5 days for anxiety or sleep.   anastrozole 1 MG tablet Commonly known as: ARIMIDEX Take 1 tablet (1 mg total) by mouth daily.   BD Pen Needle Nano U/F 32G X 4 MM Misc Generic drug: Insulin Pen Needle   cephALEXin 500 MG capsule Commonly known as: KEFLEX Take 1 capsule (500 mg total) by mouth every 8 (eight) hours for 7 days.   clopidogrel 75 MG tablet Commonly known as: PLAVIX Take 1 tablet (75 mg total) by mouth daily.   divalproex 500 MG 24 hr tablet Commonly known  as: Depakote ER Take 1 tablet (500 mg total) by mouth at bedtime.   feeding supplement Liqd Take 237 mLs by mouth 2 (two) times daily between meals for 7 days.   metFORMIN 500 MG 24 hr tablet Commonly known as: GLUCOPHAGE-XR Take 1,000 mg by mouth at bedtime.   multivitamin with minerals Tabs tablet Take 1 tablet by mouth daily.   PARoxetine 20 MG tablet Commonly known as: PAXIL Take 20 mg by mouth at bedtime.   saccharomyces boulardii 250 MG capsule Commonly known as: FLORASTOR Take 1 capsule (250 mg total) by mouth 2 (two) times daily for 7 days.   simvastatin 40 MG tablet Commonly known as: ZOCOR Take 40 mg by mouth at bedtime.   telmisartan 40 MG tablet Commonly known as: MICARDIS Take 20 mg by mouth daily.   Vitamin D 50 MCG (2000 UT) Caps Take 2,000  Units by mouth every morning.            Durable Medical Equipment  (From admission, onward)         Start     Ordered   11/03/20 0719  For home use only DME 3 n 1  Once        11/03/20 0718         Allergies  Allergen Reactions  . Niaspan [Niacin Er] Hives and Swelling    welps    Contact information for follow-up providers    Earnie Larsson, MD. Schedule an appointment as soon as possible for a visit in 2 weeks.   Specialty: Neurosurgery Contact information: [address] [place] [address] [phone]        Lawerance Cruel, MD. Call in 1 day(s).   Specialty: Family Medicine Why: Please call for a post hospital follow-up appointment. Contact information: [address] [place] [address] [phone]        Kristeen Miss, MD. Call in 1 day(s).   Specialty: Neurosurgery Why: An appointment has been made for you with Dr. Ellene Route on 11/18/2020 at 11:30 AM.  Please keep appointment. Contact information: [address] [place] [address] [phone]            Contact information for after-discharge care    Destination    [facility] Preferred SNF .   Service: Skilled Nursing Contact information: [address] [place] [phone]                   The results of significant diagnostics from this hospitalization (including imaging, microbiology, ancillary and laboratory) are listed below for reference.    Significant Diagnostic Studies: DG Lumbar Spine Complete  Result Date: 11/01/2020 CLINICAL DATA:  Back pain after fall 2 weeks ago. EXAM: LUMBAR SPINE - COMPLETE 4+ VIEW COMPARISON:  None. FINDINGS: Mild compression deformity of T12 vertebral body is noted of uncertain age. Moderate degenerative disc disease is noted at L5-S1. Diffuse osteopenia is noted. IMPRESSION: Mild compression deformity of T12 vertebral body is noted of uncertain age. MRI may be  performed for further evaluation. Moderate degenerative disc disease is noted at L5-S1. Aortic Atherosclerosis (ICD10-I70.0). Electronically Signed   By: Marijo Conception M.D.   On: 11/01/2020 15:32   CT Head Wo Contrast  Result Date: 11/01/2020 CLINICAL DATA:  Head trauma, minor.  Neck trauma. EXAM: CT HEAD WITHOUT CONTRAST CT CERVICAL SPINE WITHOUT CONTRAST TECHNIQUE: Multidetector CT imaging of the head and cervical spine was performed following the standard  protocol without intravenous contrast. Multiplanar CT image reconstructions of the cervical spine were also generated. COMPARISON:  Brain MRI 06/22/2020. MRI/MRA head 11/17/2018. Cervical spine MRI 06/24/2020. FINDINGS: CT HEAD FINDINGS Brain: Mild cerebral and cerebellar atrophy. Redemonstrated large cortical/subcortical left PCA territory infarct within the left parietooccipital and temporal lobes. Associated chronic infarct within the left thalamus. Known chronic lacunar infarcts within the pons, some of which were better appreciated on the prior brain MRI of 06/22/2020. Known small infarcts within the right cerebellar hemisphere. Background moderate ill-defined hypoattenuation within the cerebral white matter is nonspecific, but compatible with chronic small vessel ischemic disease. There is no acute intracranial hemorrhage. No acute demarcated cortical infarct. No extra-axial fluid collection. No evidence of intracranial mass. No midline shift. Vascular: No hyperdense vessel.  Atherosclerotic calcifications. Skull: Normal. Negative for fracture or focal lesion. Sinuses/Orbits: Visualized orbits show no acute finding. No significant paranasal sinus disease at the imaged levels. CT CERVICAL SPINE FINDINGS Alignment: Cervicothoracic levocurvature. Straightening of the expected cervical lordosis. No significant spondylolisthesis. Skull base and vertebrae: The basion-dental and atlanto-dental intervals are maintained.No evidence of acute fracture to the  cervical spine. Soft tissues and spinal canal: No prevertebral fluid or swelling. No visible canal hematoma. Disc levels: Cervical spondylosis multilevel disc space narrowing, disc bulges, posterior disc osteophytes and uncovertebral hypertrophy. Notably, disc space narrowing is moderate/advanced at C5-C6. Also at C5-C6, a disc bulge and uncovertebral hypertrophy contribute to right neural foraminal narrowing and suspected mild spinal canal stenosis. At C6-C7, a left center cranially migrated disc extrusion contributes to suspected moderate/severe spinal canal stenosis. Ligamentum flavum calcification on the right at C4-C5. Upper chest: No consolidation within the imaged lung apices. No visible pneumothorax. IMPRESSION: CT head: 1. No evidence of acute intracranial abnormality. 2. Redemonstrated chronic left PCA territory infarcts. 3. Known chronic infarcts within the pons and right cerebellum. 4. Background mild generalized atrophy of the brain and moderate chronic small vessel ischemic disease, stable. CT cervical spine: 1. No evidence of acute fracture to the cervical spine. 2. Nonspecific straightening of the expected cervical lordosis. 3. Cervicothoracic levocurvature. 4. Cervical spondylosis as described. Most notably at C6-C7 a left center cranially migrated disc extrusion contributes to suspected moderate/severe spinal canal stenosis. Electronically Signed   By: Kellie Simmering DO   On: 11/01/2020 14:53   CT Cervical Spine Wo Contrast  Result Date: 11/01/2020 CLINICAL DATA:  Head trauma, minor.  Neck trauma. EXAM: CT HEAD WITHOUT CONTRAST CT CERVICAL SPINE WITHOUT CONTRAST TECHNIQUE: Multidetector CT imaging of the head and cervical spine was performed following the standard protocol without intravenous contrast. Multiplanar CT image reconstructions of the cervical spine were also generated. COMPARISON:  Brain MRI 06/22/2020. MRI/MRA head 11/17/2018. Cervical spine MRI 06/24/2020. FINDINGS: CT HEAD FINDINGS  Brain: Mild cerebral and cerebellar atrophy. Redemonstrated large cortical/subcortical left PCA territory infarct within the left parietooccipital and temporal lobes. Associated chronic infarct within the left thalamus. Known chronic lacunar infarcts within the pons, some of which were better appreciated on the prior brain MRI of 06/22/2020. Known small infarcts within the right cerebellar hemisphere. Background moderate ill-defined hypoattenuation within the cerebral white matter is nonspecific, but compatible with chronic small vessel ischemic disease. There is no acute intracranial hemorrhage. No acute demarcated cortical infarct. No extra-axial fluid collection. No evidence of intracranial mass. No midline shift. Vascular: No hyperdense vessel.  Atherosclerotic calcifications. Skull: Normal. Negative for fracture or focal lesion. Sinuses/Orbits: Visualized orbits show no acute finding. No significant paranasal sinus disease at the imaged levels. CT  CERVICAL SPINE FINDINGS Alignment: Cervicothoracic levocurvature. Straightening of the expected cervical lordosis. No significant spondylolisthesis. Skull base and vertebrae: The basion-dental and atlanto-dental intervals are maintained.No evidence of acute fracture to the cervical spine. Soft tissues and spinal canal: No prevertebral fluid or swelling. No visible canal hematoma. Disc levels: Cervical spondylosis multilevel disc space narrowing, disc bulges, posterior disc osteophytes and uncovertebral hypertrophy. Notably, disc space narrowing is moderate/advanced at C5-C6. Also at C5-C6, a disc bulge and uncovertebral hypertrophy contribute to right neural foraminal narrowing and suspected mild spinal canal stenosis. At C6-C7, a left center cranially migrated disc extrusion contributes to suspected moderate/severe spinal canal stenosis. Ligamentum flavum calcification on the right at C4-C5. Upper chest: No consolidation within the imaged lung apices. No visible  pneumothorax. IMPRESSION: CT head: 1. No evidence of acute intracranial abnormality. 2. Redemonstrated chronic left PCA territory infarcts. 3. Known chronic infarcts within the pons and right cerebellum. 4. Background mild generalized atrophy of the brain and moderate chronic small vessel ischemic disease, stable. CT cervical spine: 1. No evidence of acute fracture to the cervical spine. 2. Nonspecific straightening of the expected cervical lordosis. 3. Cervicothoracic levocurvature. 4. Cervical spondylosis as described. Most notably at C6-C7 a left center cranially migrated disc extrusion contributes to suspected moderate/severe spinal canal stenosis. Electronically Signed   By: Kellie Simmering DO   On: 11/01/2020 14:53   DG HIP UNILAT WITH PELVIS 2-3 VIEWS RIGHT  Result Date: 11/01/2020 CLINICAL DATA:  Right hip pain after fall. EXAM: DG HIP (WITH OR WITHOUT PELVIS) 2-3V RIGHT COMPARISON:  None. FINDINGS: There is no evidence of hip fracture or dislocation. Mild osteophyte formation is noted. IMPRESSION: Mild degenerative joint disease is noted. No acute abnormality is noted. Electronically Signed   By: Marijo Conception M.D.   On: 11/01/2020 15:29    Microbiology: Recent Results (from the past 240 hour(s))  Culture, Urine     Status: Abnormal   Collection Time: 11/02/20  1:30 AM   Specimen: Urine, Random  Result Value Ref Range Status   Specimen Description URINE, RANDOM  Final   Special Requests   Final    NONE Performed at [facility], [address], [place], [place] [address]    Culture >=100,000 COLONIES/mL ESCHERICHIA COLI (A)  Final   Report Status 11/04/2020 FINAL  Final   Organism ID, Bacteria ESCHERICHIA COLI (A)  Final      Susceptibility   Escherichia coli - MIC*    AMPICILLIN <=2 SENSITIVE Sensitive     CEFAZOLIN <=4 SENSITIVE Sensitive     CEFEPIME <=0.12 SENSITIVE Sensitive     CEFTRIAXONE <=0.25 SENSITIVE Sensitive     CIPROFLOXACIN <=0.25 SENSITIVE Sensitive      GENTAMICIN <=1 SENSITIVE Sensitive     IMIPENEM <=0.25 SENSITIVE Sensitive     NITROFURANTOIN <=16 SENSITIVE Sensitive     TRIMETH/SULFA <=20 SENSITIVE Sensitive     AMPICILLIN/SULBACTAM <=2 SENSITIVE Sensitive     PIP/TAZO <=4 SENSITIVE Sensitive     * >=100,000 COLONIES/mL ESCHERICHIA COLI  Resp Panel by RT-PCR (Flu A&B, Covid) Nasopharyngeal Swab     Status: None   Collection Time: 11/02/20  2:45 AM   Specimen: Nasopharyngeal Swab; Nasopharyngeal(NP) swabs in vial transport medium  Result Value Ref Range Status   SARS Coronavirus 2 by RT PCR NEGATIVE NEGATIVE Final    Comment: (NOTE) SARS-CoV-2 target nucleic acids are NOT DETECTED.  The SARS-CoV-2 RNA is generally detectable in upper respiratory specimens during the acute phase of infection.  The lowest concentration of SARS-CoV-2 viral copies this assay can detect is 138 copies/mL. A negative result does not preclude SARS-Cov-2 infection and should not be used as the sole basis for treatment or other patient management decisions. A negative result may occur with  improper specimen collection/handling, submission of specimen other than nasopharyngeal swab, presence of viral mutation(s) within the areas targeted by this assay, and inadequate number of viral copies(<138 copies/mL). A negative result must be combined with clinical observations, patient history, and epidemiological information. The expected result is Negative.  Fact Sheet for Patients:  EntrepreneurPulse.com.au  Fact Sheet for Healthcare Providers:  IncredibleEmployment.be  This test is no t yet approved or cleared by the [place] FDA and  has been authorized for detection and/or diagnosis of SARS-CoV-2 by FDA under an Emergency Use Authorization (EUA). This EUA will remain  in effect (meaning this test can be used) for the duration of the COVID-19 declaration under Section 564(b)(1) of the Act, 21 U.S.C.section  360bbb-3(b)(1), unless the authorization is terminated  or revoked sooner.       Influenza A by PCR NEGATIVE NEGATIVE Final   Influenza B by PCR NEGATIVE NEGATIVE Final    Comment: (NOTE) The Xpert Xpress SARS-CoV-2/FLU/RSV plus assay is intended as an aid in the diagnosis of influenza from Nasopharyngeal swab specimens and should not be used as a sole basis for treatment. Nasal washings and aspirates are unacceptable for Xpert Xpress SARS-CoV-2/FLU/RSV testing.  Fact Sheet for Patients: EntrepreneurPulse.com.au  Fact Sheet for Healthcare Providers: IncredibleEmployment.be  This test is not yet approved or cleared by the [place] FDA and has been authorized for detection and/or diagnosis of SARS-CoV-2 by FDA under an Emergency Use Authorization (EUA). This EUA will remain in effect (meaning this test can be used) for the duration of the COVID-19 declaration under Section 564(b)(1) of the Act, 21 U.S.C. section 360bbb-3(b)(1), unless the authorization is terminated or revoked.  Performed at [facility], [place] [address], [place], [place] [address]   SARS CORONAVIRUS 2 (TAT 6-24 HRS) Nasopharyngeal Nasopharyngeal Swab     Status: None   Collection Time: 11/04/20  3:00 PM   Specimen: Nasopharyngeal Swab  Result Value Ref Range Status   SARS Coronavirus 2 NEGATIVE NEGATIVE Final    Comment: (NOTE) SARS-CoV-2 target nucleic acids are NOT DETECTED.  The SARS-CoV-2 RNA is generally detectable in upper and lower respiratory specimens during the acute phase of infection. Negative results do not preclude SARS-CoV-2 infection, do not rule out co-infections with other pathogens, and should not be used as the sole basis for treatment or other patient management decisions. Negative results must be combined with clinical observations, patient history, and epidemiological information. The expected result is Negative.  Fact Sheet for  Patients: SugarRoll.be  Fact Sheet for Healthcare Providers: https://www.woods-mathews.com/  This test is not yet approved or cleared by the [place] FDA and  has been authorized for detection and/or diagnosis of SARS-CoV-2 by FDA under an Emergency Use Authorization (EUA). This EUA will remain  in effect (meaning this test can be used) for the duration of the COVID-19 declaration under Se ction 564(b)(1) of the Act, 21 U.S.C. section 360bbb-3(b)(1), unless the authorization is terminated or revoked sooner.  Performed at [facility], [place] [address] [place], [place] [address]      Labs: Basic Metabolic Panel: Recent Labs  Lab 11/01/20 1842 11/04/20 0723  NA 138 138  K 4.2 4.2  CL 104 102  CO2 25  22  GLUCOSE 182* 161*  BUN 14 19  CREATININE 0.95 0.86  CALCIUM 9.5 9.4   Liver Function Tests: Recent Labs  Lab 11/01/20 1842  AST 28  ALT 21  ALKPHOS 77  BILITOT 0.8  PROT 6.6  ALBUMIN 3.3*   No results for input(s): LIPASE, AMYLASE in the last 168 hours. No results for input(s): AMMONIA in the last 168 hours. CBC: Recent Labs  Lab 11/01/20 1842 11/02/20 0700  WBC 5.2 5.8  NEUTROABS 3.0  --   HGB 11.3* 12.1  HCT 35.4* 37.3  MCV 95.7 94.2  PLT 265 250   Cardiac Enzymes: No results for input(s): CKTOTAL, CKMB, CKMBINDEX, TROPONINI in the last 168 hours. BNP: BNP (last 3 results) No results for input(s): BNP in the last 8760 hours.  ProBNP (last 3 results) No results for input(s): PROBNP in the last 8760 hours.  CBG: Recent Labs  Lab 11/02/20 2111  GLUCAP 156*       Signed:  Oswald Hillock, MD [facility] 11/05/2020, 9:03 AM

## 2020-11-04 NOTE — Discharge Instructions (Signed)
Fall Prevention in the Home, Adult Falls can cause injuries and can happen to people of all ages. There are many things you can do to make your home safe and to help prevent falls. Ask for help when making these changes. What actions can I take to prevent falls? General Instructions  Use good lighting in all rooms. Replace any light bulbs that burn out.  Turn on the lights in dark areas. Use night-lights.  Keep items that you use often in easy-to-reach places. Lower the shelves around your home if needed.  Set up your furniture so you have a clear path. Avoid moving your furniture around.  Do not have throw rugs or other things on the floor that can make you trip.  Avoid walking on wet floors.  If any of your floors are uneven, fix them.  Add color or contrast paint or tape to clearly mark and help you see: ? Grab bars or handrails. ? First and last steps of staircases. ? Where the edge of each step is.  If you use a stepladder: ? Make sure that it is fully opened. Do not climb a closed stepladder. ? Make sure the sides of the stepladder are locked in place. ? Ask someone to hold the stepladder while you use it.  Know where your pets are when moving through your home. What can I do in the bathroom?  Keep the floor dry. Clean up any water on the floor right away.  Remove soap buildup in the tub or shower.  Use nonskid mats or decals on the floor of the tub or shower.  Attach bath mats securely with double-sided, nonslip rug tape.  If you need to sit down in the shower, use a plastic, nonslip stool.  Install grab bars by the toilet and in the tub and shower. Do not use towel bars as grab bars.      What can I do in the bedroom?  Make sure that you have a light by your bed that is easy to reach.  Do not use any sheets or blankets for your bed that hang to the floor.  Have a firm chair with side arms that you can use for support when you get dressed. What can I do  in the kitchen?  Clean up any spills right away.  If you need to reach something above you, use a step stool with a grab bar.  Keep electrical cords out of the way.  Do not use floor polish or wax that makes floors slippery. What can I do with my stairs?  Do not leave any items on the stairs.  Make sure that you have a light switch at the top and the bottom of the stairs.  Make sure that there are handrails on both sides of the stairs. Fix handrails that are broken or loose.  Install nonslip stair treads on all your stairs.  Avoid having throw rugs at the top or bottom of the stairs.  Choose a carpet that does not hide the edge of the steps on the stairs.  Check carpeting to make sure that it is firmly attached to the stairs. Fix carpet that is loose or worn. What can I do on the outside of my home?  Use bright outdoor lighting.  Fix the edges of walkways and driveways and fix any cracks.  Remove anything that might make you trip as you walk through a door, such as a raised step or threshold.  Trim  any bushes or trees on paths to your home.  Check to see if handrails are loose or broken and that both sides of all steps have handrails.  Install guardrails along the edges of any raised decks and porches.  Clear paths of anything that can make you trip, such as tools or rocks.  Have leaves, snow, or ice cleared regularly.  Use sand or salt on paths during winter.  Clean up any spills in your garage right away. This includes grease or oil spills. What other actions can I take?  Wear shoes that: ? Have a low heel. Do not wear high heels. ? Have rubber bottoms. ? Feel good on your feet and fit well. ? Are closed at the toe. Do not wear open-toe sandals.  Use tools that help you move around if needed. These include: ? Canes. ? Walkers. ? Scooters. ? Crutches.  Review your medicines with your doctor. Some medicines can make you feel dizzy. This can increase your  chance of falling. Ask your doctor what else you can do to help prevent falls. Where to find more information  Centers for Disease Control and Prevention, [place]AD[place]: http://www.wolf.info/  National Institute on Aging: http://kim-miller.com/ Contact a doctor if:  You are afraid of falling at home.  You feel weak, drowsy, or dizzy at home.  You fall at home. Summary  There are many simple things that you can do to make your home safe and to help prevent falls.  Ways to make your home safe include removing things that can make you trip and installing grab bars in the bathroom.  Ask for help when making these changes in your home. This information is not intended to replace advice given to you by your health care provider. Make sure you discuss any questions you have with your health care provider. Document Revised: 03/30/2020 Document Reviewed: 03/30/2020 Elsevier Patient Education  2021 [place], Adult Being a patient in the hospital puts you at risk for falling. Falls can cause serious injury and harm, but they can be prevented. It is important to understand what puts you at risk for falling and what you and your health care team can do to prevent you from falling. If you or a loved one falls at the hospital, it is important to tell hospital staff about it. What increases the risk for falls? Certain conditions and treatments may increase your risk of falling in the hospital. These include:  Being in an unfamiliar environment, especially when using the bathroom at night.  Having surgery.  Being on bed rest.  Taking many medicines or certain types of medicines, such as sleeping pills.  Having tubes in place, such as IV lines or catheters. Other risk factors for falls in a hospital include:  Having difficulty with hearing or vision.  Having a change in thinking or behavior, such as confusion.  Having depression.  Having trouble with balance.  Being a  female.  Feeling dizzy.  Needing to use the toilet frequently.  Having fallen during the past three months.  Having low blood pressure. What are some strategies for preventing falls? If you or a loved one has to stay in the hospital:  Ask about which fall prevention strategies will be in place. Do not hesitate to speak up if you notice that the fall prevention plan has changed.  Ask for help moving around, especially after surgery or when feeling unwell.  If you have been asked to  call for help when getting up, do not get up by yourself. Asking for help with getting up is for your safety, and the staff is there to help you.  Wear nonskid footwear.  Get up slowly, and sit at the side of the bed for a few minutes before standing up.  Keep items you need, such as the nurse call button or a phone, close to you so that you do not need to reach for them.  Wear eyeglasses or hearing aids if you have them.  Have someone stay in the hospital with you or your loved one.  Ask if sleeping pills or other medicines that can cause confusion are necessary.   What does the hospital staff do to help prevent falls? Hospitals have systems in place to prevent falls and accidents, which may involve:  Discussing your fall risks and making a personalized fall prevention plan.  Checking in regularly to see if you need help.  Placing an arm band on your wrist or a sign near your room to alert other staff of your needs.  Using an alarm on your hospital bed. This is an alarm that goes off if you get out of bed and forget to call for help.  Keeping the bed in a low and locked position.  Keeping the area around the bed and bathroom well-lit and free from clutter.  Keeping your room quiet, so that you can sleep and be well-rested.  Using safety equipment, such as: ? A belt around your waist. ? Walkers, crutches, and other devices for support. ? Safety beds, such as low beds or cushions on the floor  next to the bed.  Having a staff person stay with you (one-on-one observation), even when you are using the bathroom. This is for your safety.  Using video monitoring. This allows a staff member to come to help you if you need help.   What other actions can I take to lower my risk of falls?  Check in regularly with your health care provider or pharmacist to review all of the medicines that you take.  Make sure that you have a regular exercise program to stay fit. This will help you maintain your balance.  Talk with a physical therapist or trainer if recommended by your health care provider. They can help you to improve your strength, balance, and endurance.  If you are over age 33: ? Ask your health care provider if you need a calcium or vitamin D supplement. ? Have your eyes and hearing checked every year. ? Have your feet checked every year. Where to find more information You can find more information about fall prevention from the Centers for Disease Control and Prevention: ImproveLook.cz Summary  Being in an unfamiliar environment, such as the hospital, increases your risk for falling.  If you have been asked to call for help when getting up, do not get up by yourself. Asking for help with getting up is for your safety, and the staff is there to help you.  Ask about which fall prevention strategies will be in place. Do not hesitate to speak up if you notice that the fall prevention plan has changed.  If you or a loved one falls, tell the hospital staff. This is important. This information is not intended to replace advice given to you by your health care provider. Make sure you discuss any questions you have with your health care provider. Document Revised: 08/09/2017 Document Reviewed: 04/10/2017 Elsevier Patient Education  [place].   Urinary Tract Infection, Adult A urinary tract infection (UTI) is an infection of any part of the urinary tract. The urinary tract  includes:  The kidneys.  The ureters.  The bladder.  The urethra. These organs make, store, and get rid of pee (urine) in the body. What are the causes? This infection is caused by germs (bacteria) in your genital area. These germs grow and cause swelling (inflammation) of your urinary tract. What increases the risk? The following factors may make you more likely to develop this condition:  Using a small, thin tube (catheter) to drain pee.  Not being able to control when you pee or poop (incontinence).  Being female. If you are female, these things can increase the risk: ? Using these methods to prevent pregnancy:  A medicine that kills sperm (spermicide).  A device that blocks sperm (diaphragm). ? Having low levels of a female hormone (estrogen). ? Being pregnant. You are more likely to develop this condition if:  You have genes that add to your risk.  You are sexually active.  You take antibiotic medicines.  You have trouble peeing because of: ? A prostate that is bigger than normal, if you are female. ? A blockage in the part of your body that drains pee from the bladder. ? A kidney stone. ? A nerve condition that affects your bladder. ? Not getting enough to drink. ? Not peeing often enough.  You have other conditions, such as: ? Diabetes. ? A weak disease-fighting system (immune system). ? Sickle cell disease. ? Gout. ? Injury of the spine. What are the signs or symptoms? Symptoms of this condition include:  Needing to pee right away.  Peeing small amounts often.  Pain or burning when peeing.  Blood in the pee.  Pee that smells bad or not like normal.  Trouble peeing.  Pee that is cloudy.  Fluid coming from the vagina, if you are female.  Pain in the belly or lower back. Other symptoms include:  Vomiting.  Not feeling hungry.  Feeling mixed up (confused). This may be the first symptom in older adults.  Being tired and grouchy  (irritable).  A fever.  Watery poop (diarrhea). How is this treated?  Taking antibiotic medicine.  Taking other medicines.  Drinking enough water. In some cases, you may need to see a specialist. Follow these instructions at home: Medicines  Take over-the-counter and prescription medicines only as told by your doctor.  If you were prescribed an antibiotic medicine, take it as told by your doctor. Do not stop taking it even if you start to feel better. General instructions  Make sure you: ? Pee until your bladder is empty. ? Do not hold pee for a long time. ? Empty your bladder after sex. ? Wipe from front to back after peeing or pooping if you are a female. Use each tissue one time when you wipe.  Drink enough fluid to keep your pee pale yellow.  Keep all follow-up visits.   Contact a doctor if:  You do not get better after 1-2 days.  Your symptoms go away and then come back. Get help right away if:  You have very bad back pain.  You have very bad pain in your lower belly.  You have a fever.  You have chills.  You feeling like you will vomit or you vomit. Summary  A urinary tract infection (UTI) is an infection of any part of the urinary tract.  This condition is caused by germs in your genital area.  There are many risk factors for a UTI.  Treatment includes antibiotic medicines.  Drink enough fluid to keep your pee pale yellow. This information is not intended to replace advice given to you by your health care provider. Make sure you discuss any questions you have with your health care provider. Document Revised: 04/08/2020 Document Reviewed: 04/08/2020 Elsevier Patient Education  [place]. Follow up with Dr. Trenton Gammon in his office in 1-2 weeks.

## 2020-11-04 NOTE — TOC Progression Note (Addendum)
Transition of Care [facility]) - Progression Note    Patient Details  Name: ALIZEY NOREN MRN: [id number] Date of Birth: August 15, 1946  Transition of Care [facility]) CM/SW [place], [place] Phone Number: 11/04/2020, 1:50 PM  Clinical Narrative:     CSW spoke to pts sister Tye Maryland about bed offers. Tye [place] and daughter Caryl Asp will review the SNFS and have answer by end of the day.   2:38pm- CSW received call from Pts daughter and sister. They chose [facility] for snf. CSW confirmed that [facility] can accept pt on Saturday 2/26. CSW requested covid test from MD.   Expected Discharge Plan: [place] Barriers to Discharge: Continued Medical Work up  Expected Discharge Plan and Services Expected Discharge Plan: [place] arrangements for the past 2 months: Single Family Home Expected Discharge Date: 11/04/20                                     Social Determinants of Health (SDOH) Interventions    Readmission Risk Interventions No flowsheet data found.  Emeterio Reeve, Latanya Presser, [place] Social Worker [phone]

## 2020-11-04 NOTE — Progress Notes (Signed)
Physical Therapy Treatment Patient Details Name: Anna Hunt MRN: [id number] DOB: [birth year] Today's Date: 11/04/2020    History of Present Illness Pt is a 75 y/o female presenting to the ED following multiple falls and difficulty caring for herself at home. 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 PMH includes breast cancer, vertigo, DM, CVA, and L breast cancer.    PT Comments    Pt received in chair, cooperative and pleasant. Pt's daughter present for entire session. Reviewed back precautions and educated pt, daughter, & nursing on correct way to don TLSO. Generally min A for steadying. Minimal cueing for navigation. Able to progress gait distance today and likely would have been able to progress further if not for urinary incontinence. Pt demonstrated decreased awareness, problem-solving, and memory. Often asking the same question multiple times and forgetting she asked beforehand. SNF still appropriate due to cognitive factors. Pt left in chair with all needs met, call bell within reach, RN aware of status, and daughter present.    Follow Up Recommendations  SNF;Supervision/Assistance - 24 hour     Equipment Recommendations  3in1 (PT) (TBD)    Recommendations for Other Services       Precautions / Restrictions Precautions Precautions: Back;Fall Precaution Comments: Multiple falls at home Required Braces or Orthoses: Spinal Brace Spinal Brace: Thoracolumbosacral orthotic;Applied in sitting position;Applied in standing position Restrictions Weight Bearing Restrictions: No    Mobility  Bed Mobility               General bed mobility comments: Pt received in chair    Transfers Overall transfer level: Needs assistance Equipment used: Rolling Hector (2 wheeled) Transfers: Sit to/from Stand Sit to Stand: Min assist         General transfer comment: Min A for power up into standing  Ambulation/Gait Ambulation/Gait assistance: Min  assist Gait Distance (Feet): 75 Feet Assistive device: Rolling Linson (2 wheeled) Gait Pattern/deviations: Step-through pattern;Decreased stride length;Shuffle Gait velocity: Decreased   General Gait Details: Min A for steadying. Cues for navigation   Stairs             Wheelchair Mobility    Modified Rankin (Stroke Patients Only)       Balance Overall balance assessment: Needs assistance Sitting-balance support: No upper extremity supported;Feet supported Sitting balance-Leahy Scale: Good     Standing balance support: Bilateral upper extremity supported;During functional activity Standing balance-Leahy Scale: Poor Standing balance comment: Reliant on BUE support                            Cognition Arousal/Alertness: Awake/alert Behavior During Therapy: WFL for tasks assessed/performed Overall Cognitive Status: Impaired/Different from baseline Area of Impairment: Safety/judgement;Problem solving;Memory;Attention                   Current Attention Level: Sustained Memory: Decreased short-term memory;Decreased recall of precautions   Safety/Judgement: Decreased awareness of deficits;Decreased awareness of safety   Problem Solving: Slow processing        Exercises      General Comments General comments (skin integrity, edema, etc.): Adjusted brace, revied back precatuions with pt and her daughter. Educated nursing on correct positioning of brace      Pertinent Vitals/Pain Pain Assessment: Faces Faces Pain Scale: Hurts a little bit Pain Location: back Pain Descriptors / Indicators: Aching Pain Intervention(s): Monitored during session    Home Living  Prior Function            PT Goals (current goals can now be found in the care plan section) Acute Rehab PT Goals Patient Stated Goal: to go home    Frequency    Min 2X/week      PT Plan Current plan remains appropriate    Co-evaluation               AM-PAC PT "6 Clicks" Mobility   Outcome Measure  Help needed turning from your back to your side while in a flat bed without using bedrails?: A Lot Help needed moving from lying on your back to sitting on the side of a flat bed without using bedrails?: A Lot Help needed moving to and from a bed to a chair (including a wheelchair)?: A Little Help needed standing up from a chair using your arms (e.g., wheelchair or bedside chair)?: A Little Help needed to walk in hospital room?: A Little Help needed climbing 3-5 steps with a railing? : A Lot 6 Click Score: 15    End of Session Equipment Utilized During Treatment: Other (comment) (Brace) Activity Tolerance: Patient tolerated treatment well;Other (comment) (Incontinence during ambulation) Patient left: with call bell/phone within reach;with family/visitor present;in chair (on stretcher in ED) Nurse Communication: Mobility status PT Visit Diagnosis: Unsteadiness on feet (R26.81);Muscle weakness (generalized) (M62.81);Repeated falls (R29.6);History of falling (Z91.81)     Time:  -     Charges:                        Rosita Kea, SPT

## 2020-11-05 DIAGNOSIS — E440 Moderate protein-calorie malnutrition: Secondary | ICD-10-CM | POA: Diagnosis not present

## 2020-11-05 DIAGNOSIS — R630 Anorexia: Secondary | ICD-10-CM | POA: Diagnosis not present

## 2020-11-05 DIAGNOSIS — I499 Cardiac arrhythmia, unspecified: Secondary | ICD-10-CM | POA: Diagnosis not present

## 2020-11-05 DIAGNOSIS — M5020 Other cervical disc displacement, unspecified cervical region: Secondary | ICD-10-CM | POA: Diagnosis not present

## 2020-11-05 DIAGNOSIS — R0902 Hypoxemia: Secondary | ICD-10-CM | POA: Diagnosis not present

## 2020-11-05 DIAGNOSIS — I10 Essential (primary) hypertension: Secondary | ICD-10-CM | POA: Diagnosis not present

## 2020-11-05 DIAGNOSIS — Z7401 Bed confinement status: Secondary | ICD-10-CM | POA: Diagnosis not present

## 2020-11-05 DIAGNOSIS — M2550 Pain in unspecified joint: Secondary | ICD-10-CM | POA: Diagnosis not present

## 2020-11-05 DIAGNOSIS — C50212 Malignant neoplasm of upper-inner quadrant of left female breast: Secondary | ICD-10-CM | POA: Diagnosis not present

## 2020-11-05 DIAGNOSIS — R262 Difficulty in walking, not elsewhere classified: Secondary | ICD-10-CM | POA: Diagnosis not present

## 2020-11-05 DIAGNOSIS — R410 Disorientation, unspecified: Secondary | ICD-10-CM | POA: Diagnosis not present

## 2020-11-05 DIAGNOSIS — R627 Adult failure to thrive: Secondary | ICD-10-CM | POA: Diagnosis not present

## 2020-11-05 DIAGNOSIS — E1165 Type 2 diabetes mellitus with hyperglycemia: Secondary | ICD-10-CM | POA: Diagnosis not present

## 2020-11-05 DIAGNOSIS — R296 Repeated falls: Secondary | ICD-10-CM | POA: Diagnosis not present

## 2020-11-05 DIAGNOSIS — H814 Vertigo of central origin: Secondary | ICD-10-CM | POA: Diagnosis not present

## 2020-11-05 DIAGNOSIS — F32A Depression, unspecified: Secondary | ICD-10-CM | POA: Diagnosis not present

## 2020-11-05 DIAGNOSIS — Z170 Estrogen receptor positive status [ER+]: Secondary | ICD-10-CM | POA: Diagnosis not present

## 2020-11-05 DIAGNOSIS — D649 Anemia, unspecified: Secondary | ICD-10-CM | POA: Diagnosis not present

## 2020-11-05 DIAGNOSIS — F419 Anxiety disorder, unspecified: Secondary | ICD-10-CM | POA: Diagnosis not present

## 2020-11-05 DIAGNOSIS — R2681 Unsteadiness on feet: Secondary | ICD-10-CM | POA: Diagnosis not present

## 2020-11-05 DIAGNOSIS — Z8673 Personal history of transient ischemic attack (TIA), and cerebral infarction without residual deficits: Secondary | ICD-10-CM | POA: Diagnosis not present

## 2020-11-05 DIAGNOSIS — I447 Left bundle-branch block, unspecified: Secondary | ICD-10-CM | POA: Diagnosis not present

## 2020-11-05 DIAGNOSIS — G8929 Other chronic pain: Secondary | ICD-10-CM | POA: Diagnosis not present

## 2020-11-05 DIAGNOSIS — Z7901 Long term (current) use of anticoagulants: Secondary | ICD-10-CM | POA: Diagnosis not present

## 2020-11-05 DIAGNOSIS — M5459 Other low back pain: Secondary | ICD-10-CM | POA: Diagnosis not present

## 2020-11-05 DIAGNOSIS — E559 Vitamin D deficiency, unspecified: Secondary | ICD-10-CM | POA: Diagnosis not present

## 2020-11-05 DIAGNOSIS — T148XXA Other injury of unspecified body region, initial encounter: Secondary | ICD-10-CM | POA: Diagnosis not present

## 2020-11-05 DIAGNOSIS — N390 Urinary tract infection, site not specified: Secondary | ICD-10-CM | POA: Diagnosis not present

## 2020-11-05 DIAGNOSIS — S22080A Wedge compression fracture of T11-T12 vertebra, initial encounter for closed fracture: Secondary | ICD-10-CM | POA: Diagnosis not present

## 2020-11-05 DIAGNOSIS — M6281 Muscle weakness (generalized): Secondary | ICD-10-CM | POA: Diagnosis not present

## 2020-11-05 DIAGNOSIS — F411 Generalized anxiety disorder: Secondary | ICD-10-CM | POA: Diagnosis not present

## 2020-11-05 MED ORDER — ALPRAZOLAM 0.5 MG PO TABS: 0.5000 mg | ORAL_TABLET | Freq: Every evening | ORAL | 0 refills | Status: AC | PRN

## 2020-11-05 NOTE — Progress Notes (Signed)
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  She was found to have UTI initially started on Rocephin and switch to p.o. Keflex.  Patient was seen by physical therapy they recommended skilled nursing facility.  Patient is stable for discharge today.  See details in discharge summary.

## 2020-11-05 NOTE — TOC Transition Note (Signed)
Transition of Care [facility]) - CM/SW Discharge Note   Patient Details  Name: Anna Hunt MRN: [id number] Date of Birth: 27-Jul-1946  Transition of Care [facility]) CM/SW Contact:  Oretha Milch, LCSW Phone Number: 11/05/2020, 10:06 AM   Clinical Narrative:   Patient will discharge to: [facility] Discharge date: 11/05/2020 Family notified: sister and daughter Transport by: Corey Harold  Per MD patient is appropriate for discharge and will discharge to [facility] in room 122. RN, patient, patient's family, and facility have been notified of discharge. Assessment, FL-2, PASRR, and discharge summary sent to facillity. RN was provided with the following number for report: [phone]. PTAR will be used to transfer patient to the facility. CSW will continue to follow for any discharge supports.  Daphine Deutscher, LCSW, [place] Social Worker II Emergency Department, [place], [facility] [phone]      Final next level of care: Skilled Nursing Facility Barriers to Discharge: Continued Medical Work up   Patient Goals and CMS Choice Patient states their goals for this hospitalization and ongoing recovery are:: not to stumble and fall CMS Medicare.gov Compare Post Acute Care list provided to:: Patient Choice offered to / list presented to : [place]  Discharge Placement                       Discharge Plan and Services                                     Social Determinants of Health (SDOH) Interventions     Readmission Risk Interventions No flowsheet data found.

## 2020-11-05 NOTE — Progress Notes (Signed)
RN called [facility] and gave report to [place] and she stated understanding. IV has been removed and will notify CM for transport.

## 2020-11-07 ENCOUNTER — Telehealth: Payer: Self-pay | Admitting: *Deleted

## 2020-11-07 NOTE — Telephone Encounter (Signed)
I returned the call to the patient's daughter. Reports her mother's overall health has taken a significant decline. Her recent fall resulted in ED visit with injuries involved. Her mother is currently in rehab at [facility] then will be transferred to a nursing home permanently (not sure of which facility yet). After she is settled and evaluated by her new PCP at the nursing home, she will call to reschedule if neurology follow up is still recommended by the PCP.

## 2020-11-07 NOTE — Telephone Encounter (Signed)
Telephone note in Epic. 

## 2020-11-07 NOTE — Telephone Encounter (Signed)
MYCHART MESSAGE: [place], Brosious [address] Krista Blue, my mother has a appt March 9th, that needs to be rescheduled. My mother had another bad fall Tuesday 22nd. She was admitted to [facility], stayed at [facility] until Saturday 26th. She was transported to [facility] , rehab facility for 3 to 4 weeks, during this time, we are trying to find a nursing home that my family and I can get her into, as well as feel like it's a safe place for her. My mothers PCP communicated at her last visit that she could no longer be alone , that she needs 24hr care. My mother is unable to even walk with a Buice now. My mothers memory has became much worse. Her PCP and PT therapist at [facility] stated she could no longer live alone, she has to have assistance. I need to cancel her upcoming appt, unsure if we need to reschedule? Please give me a call at work number [phone]. Thank you .Marland KitchenMarland KitchenLaverna Peace

## 2020-11-14 ENCOUNTER — Encounter: Payer: Self-pay | Admitting: *Deleted

## 2020-11-14 NOTE — Telephone Encounter (Signed)
It is okay to draft a letter for her daughter's MyChart email, patient's need help in her daily activity, is a candidate for nursing home placement, since hospital admission on November 04, 2020

## 2020-11-16 ENCOUNTER — Ambulatory Visit: Payer: Medicare Other | Admitting: Neurology

## 2020-11-16 DIAGNOSIS — M5459 Other low back pain: Secondary | ICD-10-CM | POA: Diagnosis not present

## 2020-11-16 DIAGNOSIS — E440 Moderate protein-calorie malnutrition: Secondary | ICD-10-CM | POA: Diagnosis not present

## 2020-11-16 DIAGNOSIS — R627 Adult failure to thrive: Secondary | ICD-10-CM | POA: Diagnosis not present

## 2020-11-16 DIAGNOSIS — R262 Difficulty in walking, not elsewhere classified: Secondary | ICD-10-CM | POA: Diagnosis not present

## 2020-11-16 DIAGNOSIS — M6281 Muscle weakness (generalized): Secondary | ICD-10-CM | POA: Diagnosis not present

## 2020-11-16 DIAGNOSIS — R630 Anorexia: Secondary | ICD-10-CM | POA: Diagnosis not present

## 2020-11-16 DIAGNOSIS — G8929 Other chronic pain: Secondary | ICD-10-CM | POA: Diagnosis not present

## 2020-11-16 DIAGNOSIS — R410 Disorientation, unspecified: Secondary | ICD-10-CM | POA: Diagnosis not present

## 2020-11-17 ENCOUNTER — Other Ambulatory Visit: Payer: Self-pay | Admitting: *Deleted

## 2020-11-17 NOTE — Patient Outreach (Signed)
Member screened for potential [facility] needs.  Communication sent to [facility] SW to inquire about transition plans and potential [facility] needs.   Will continue to follow while member resides in [facility]    Marthenia Rolling, MSN, RN,BSN [place] Acute Care Coordinator [phone] [facility]) [phone]  (Toll free office)

## 2020-11-18 DIAGNOSIS — S22080A Wedge compression fracture of T11-T12 vertebra, initial encounter for closed fracture: Secondary | ICD-10-CM | POA: Diagnosis not present

## 2020-11-25 ENCOUNTER — Other Ambulatory Visit: Payer: Self-pay | Admitting: *Deleted

## 2020-11-25 DIAGNOSIS — T148XXA Other injury of unspecified body region, initial encounter: Secondary | ICD-10-CM | POA: Diagnosis not present

## 2020-11-25 DIAGNOSIS — Z8673 Personal history of transient ischemic attack (TIA), and cerebral infarction without residual deficits: Secondary | ICD-10-CM | POA: Diagnosis not present

## 2020-11-25 DIAGNOSIS — Z7901 Long term (current) use of anticoagulants: Secondary | ICD-10-CM | POA: Diagnosis not present

## 2020-11-25 NOTE — Patient Outreach (Signed)
[facility] Post- Acute Care Coordinator follow up.   Communication sent again and voicemail left for [facility] SW to inquire about transition plans and potential [facility] needs.   Will continue to follow and plan outreach accordingly.    Marthenia Rolling, MSN, RN,BSN [place] Acute Care Coordinator [phone] [facility]) [phone]  (Toll free office)

## 2020-11-25 NOTE — Patient Outreach (Signed)
[place] Coordinator follow up.   Update received from [place] indicating Mrs. Kim's transition plan is for [facility].   No identifiable [facility] Care Management needs at this time.   Marthenia Rolling, MSN, RN,BSN [place] Acute Care Coordinator [phone] [facility]) [phone]  (Toll free office)

## 2020-12-05 ENCOUNTER — Other Ambulatory Visit: Payer: Self-pay | Admitting: Neurology

## 2020-12-08 DIAGNOSIS — E440 Moderate protein-calorie malnutrition: Secondary | ICD-10-CM | POA: Diagnosis not present

## 2020-12-08 DIAGNOSIS — I10 Essential (primary) hypertension: Secondary | ICD-10-CM | POA: Diagnosis not present

## 2020-12-08 DIAGNOSIS — Z7901 Long term (current) use of anticoagulants: Secondary | ICD-10-CM | POA: Diagnosis not present

## 2020-12-08 DIAGNOSIS — C50212 Malignant neoplasm of upper-inner quadrant of left female breast: Secondary | ICD-10-CM | POA: Diagnosis not present

## 2020-12-08 DIAGNOSIS — Z8673 Personal history of transient ischemic attack (TIA), and cerebral infarction without residual deficits: Secondary | ICD-10-CM | POA: Diagnosis not present

## 2020-12-08 DIAGNOSIS — M5020 Other cervical disc displacement, unspecified cervical region: Secondary | ICD-10-CM | POA: Diagnosis not present

## 2020-12-08 DIAGNOSIS — M6281 Muscle weakness (generalized): Secondary | ICD-10-CM | POA: Diagnosis not present

## 2020-12-08 DIAGNOSIS — R296 Repeated falls: Secondary | ICD-10-CM | POA: Diagnosis not present

## 2020-12-08 DIAGNOSIS — R627 Adult failure to thrive: Secondary | ICD-10-CM | POA: Diagnosis not present

## 2020-12-08 DIAGNOSIS — F411 Generalized anxiety disorder: Secondary | ICD-10-CM | POA: Diagnosis not present

## 2020-12-12 DIAGNOSIS — D649 Anemia, unspecified: Secondary | ICD-10-CM | POA: Diagnosis not present

## 2020-12-12 DIAGNOSIS — E440 Moderate protein-calorie malnutrition: Secondary | ICD-10-CM | POA: Diagnosis not present

## 2020-12-12 DIAGNOSIS — Z794 Long term (current) use of insulin: Secondary | ICD-10-CM | POA: Diagnosis not present

## 2020-12-12 DIAGNOSIS — M5020 Other cervical disc displacement, unspecified cervical region: Secondary | ICD-10-CM | POA: Diagnosis not present

## 2020-12-12 DIAGNOSIS — R2681 Unsteadiness on feet: Secondary | ICD-10-CM | POA: Diagnosis not present

## 2020-12-12 DIAGNOSIS — Z853 Personal history of malignant neoplasm of breast: Secondary | ICD-10-CM | POA: Diagnosis not present

## 2020-12-12 DIAGNOSIS — E1165 Type 2 diabetes mellitus with hyperglycemia: Secondary | ICD-10-CM | POA: Diagnosis not present

## 2020-12-12 DIAGNOSIS — S22080D Wedge compression fracture of T11-T12 vertebra, subsequent encounter for fracture with routine healing: Secondary | ICD-10-CM | POA: Diagnosis not present

## 2020-12-12 DIAGNOSIS — M6281 Muscle weakness (generalized): Secondary | ICD-10-CM | POA: Diagnosis not present

## 2020-12-12 DIAGNOSIS — R627 Adult failure to thrive: Secondary | ICD-10-CM | POA: Diagnosis not present

## 2020-12-12 DIAGNOSIS — R296 Repeated falls: Secondary | ICD-10-CM | POA: Diagnosis not present

## 2020-12-12 DIAGNOSIS — Z8673 Personal history of transient ischemic attack (TIA), and cerebral infarction without residual deficits: Secondary | ICD-10-CM | POA: Diagnosis not present

## 2020-12-12 DIAGNOSIS — Z7984 Long term (current) use of oral hypoglycemic drugs: Secondary | ICD-10-CM | POA: Diagnosis not present

## 2020-12-15 DIAGNOSIS — R42 Dizziness and giddiness: Secondary | ICD-10-CM | POA: Diagnosis not present

## 2020-12-15 DIAGNOSIS — G459 Transient cerebral ischemic attack, unspecified: Secondary | ICD-10-CM | POA: Diagnosis not present

## 2020-12-15 DIAGNOSIS — R627 Adult failure to thrive: Secondary | ICD-10-CM | POA: Diagnosis not present

## 2020-12-15 DIAGNOSIS — R2681 Unsteadiness on feet: Secondary | ICD-10-CM | POA: Diagnosis not present

## 2020-12-15 DIAGNOSIS — E119 Type 2 diabetes mellitus without complications: Secondary | ICD-10-CM | POA: Diagnosis not present

## 2020-12-15 DIAGNOSIS — E559 Vitamin D deficiency, unspecified: Secondary | ICD-10-CM | POA: Diagnosis not present

## 2020-12-15 DIAGNOSIS — C50919 Malignant neoplasm of unspecified site of unspecified female breast: Secondary | ICD-10-CM | POA: Diagnosis not present

## 2020-12-15 DIAGNOSIS — E440 Moderate protein-calorie malnutrition: Secondary | ICD-10-CM | POA: Diagnosis not present

## 2020-12-15 DIAGNOSIS — R296 Repeated falls: Secondary | ICD-10-CM | POA: Diagnosis not present

## 2020-12-15 DIAGNOSIS — E1165 Type 2 diabetes mellitus with hyperglycemia: Secondary | ICD-10-CM | POA: Diagnosis not present

## 2020-12-15 DIAGNOSIS — D649 Anemia, unspecified: Secondary | ICD-10-CM | POA: Diagnosis not present

## 2020-12-20 DIAGNOSIS — R627 Adult failure to thrive: Secondary | ICD-10-CM | POA: Diagnosis not present

## 2020-12-20 DIAGNOSIS — R296 Repeated falls: Secondary | ICD-10-CM | POA: Diagnosis not present

## 2020-12-20 DIAGNOSIS — R2681 Unsteadiness on feet: Secondary | ICD-10-CM | POA: Diagnosis not present

## 2020-12-20 DIAGNOSIS — D649 Anemia, unspecified: Secondary | ICD-10-CM | POA: Diagnosis not present

## 2020-12-20 DIAGNOSIS — E1165 Type 2 diabetes mellitus with hyperglycemia: Secondary | ICD-10-CM | POA: Diagnosis not present

## 2020-12-20 DIAGNOSIS — E440 Moderate protein-calorie malnutrition: Secondary | ICD-10-CM | POA: Diagnosis not present

## 2020-12-21 DIAGNOSIS — F321 Major depressive disorder, single episode, moderate: Secondary | ICD-10-CM | POA: Diagnosis not present

## 2020-12-21 DIAGNOSIS — F3112 Bipolar disorder, current episode manic without psychotic features, moderate: Secondary | ICD-10-CM | POA: Diagnosis not present

## 2020-12-21 DIAGNOSIS — G4700 Insomnia, unspecified: Secondary | ICD-10-CM | POA: Diagnosis not present

## 2020-12-22 DIAGNOSIS — F3112 Bipolar disorder, current episode manic without psychotic features, moderate: Secondary | ICD-10-CM | POA: Diagnosis not present

## 2020-12-22 DIAGNOSIS — F321 Major depressive disorder, single episode, moderate: Secondary | ICD-10-CM | POA: Diagnosis not present

## 2020-12-23 DIAGNOSIS — R627 Adult failure to thrive: Secondary | ICD-10-CM | POA: Diagnosis not present

## 2020-12-23 DIAGNOSIS — E440 Moderate protein-calorie malnutrition: Secondary | ICD-10-CM | POA: Diagnosis not present

## 2020-12-23 DIAGNOSIS — E1165 Type 2 diabetes mellitus with hyperglycemia: Secondary | ICD-10-CM | POA: Diagnosis not present

## 2020-12-23 DIAGNOSIS — R296 Repeated falls: Secondary | ICD-10-CM | POA: Diagnosis not present

## 2020-12-23 DIAGNOSIS — R2681 Unsteadiness on feet: Secondary | ICD-10-CM | POA: Diagnosis not present

## 2020-12-23 DIAGNOSIS — D649 Anemia, unspecified: Secondary | ICD-10-CM | POA: Diagnosis not present

## 2020-12-26 DIAGNOSIS — D649 Anemia, unspecified: Secondary | ICD-10-CM | POA: Diagnosis not present

## 2020-12-26 DIAGNOSIS — E1165 Type 2 diabetes mellitus with hyperglycemia: Secondary | ICD-10-CM | POA: Diagnosis not present

## 2020-12-26 DIAGNOSIS — R2681 Unsteadiness on feet: Secondary | ICD-10-CM | POA: Diagnosis not present

## 2020-12-26 DIAGNOSIS — R627 Adult failure to thrive: Secondary | ICD-10-CM | POA: Diagnosis not present

## 2020-12-26 DIAGNOSIS — E440 Moderate protein-calorie malnutrition: Secondary | ICD-10-CM | POA: Diagnosis not present

## 2020-12-26 DIAGNOSIS — R296 Repeated falls: Secondary | ICD-10-CM | POA: Diagnosis not present

## 2020-12-28 DIAGNOSIS — D649 Anemia, unspecified: Secondary | ICD-10-CM | POA: Diagnosis not present

## 2020-12-28 DIAGNOSIS — E1165 Type 2 diabetes mellitus with hyperglycemia: Secondary | ICD-10-CM | POA: Diagnosis not present

## 2020-12-28 DIAGNOSIS — E440 Moderate protein-calorie malnutrition: Secondary | ICD-10-CM | POA: Diagnosis not present

## 2020-12-28 DIAGNOSIS — R2681 Unsteadiness on feet: Secondary | ICD-10-CM | POA: Diagnosis not present

## 2020-12-28 DIAGNOSIS — R627 Adult failure to thrive: Secondary | ICD-10-CM | POA: Diagnosis not present

## 2020-12-28 DIAGNOSIS — R296 Repeated falls: Secondary | ICD-10-CM | POA: Diagnosis not present

## 2020-12-29 ENCOUNTER — Emergency Department (HOSPITAL_COMMUNITY): Payer: Medicare Other

## 2020-12-29 ENCOUNTER — Other Ambulatory Visit: Payer: Self-pay

## 2020-12-29 ENCOUNTER — Emergency Department (HOSPITAL_COMMUNITY)
Admission: EM | Admit: 2020-12-29 | Discharge: 2020-12-29 | Disposition: A | Discharge status: Home / Self Care | Payer: Medicare Other | Attending: Emergency Medicine | Admitting: Emergency Medicine

## 2020-12-29 DIAGNOSIS — I10 Essential (primary) hypertension: Secondary | ICD-10-CM | POA: Diagnosis not present

## 2020-12-29 DIAGNOSIS — Z7902 Long term (current) use of antithrombotics/antiplatelets: Secondary | ICD-10-CM | POA: Insufficient documentation

## 2020-12-29 DIAGNOSIS — Z79899 Other long term (current) drug therapy: Secondary | ICD-10-CM | POA: Insufficient documentation

## 2020-12-29 DIAGNOSIS — Z7984 Long term (current) use of oral hypoglycemic drugs: Secondary | ICD-10-CM | POA: Diagnosis not present

## 2020-12-29 DIAGNOSIS — F321 Major depressive disorder, single episode, moderate: Secondary | ICD-10-CM | POA: Diagnosis not present

## 2020-12-29 DIAGNOSIS — M549 Dorsalgia, unspecified: Secondary | ICD-10-CM | POA: Diagnosis not present

## 2020-12-29 DIAGNOSIS — F3112 Bipolar disorder, current episode manic without psychotic features, moderate: Secondary | ICD-10-CM | POA: Diagnosis not present

## 2020-12-29 DIAGNOSIS — M545 Low back pain, unspecified: Secondary | ICD-10-CM | POA: Diagnosis not present

## 2020-12-29 DIAGNOSIS — Z87891 Personal history of nicotine dependence: Secondary | ICD-10-CM | POA: Insufficient documentation

## 2020-12-29 DIAGNOSIS — W19XXXA Unspecified fall, initial encounter: Secondary | ICD-10-CM | POA: Insufficient documentation

## 2020-12-29 DIAGNOSIS — E119 Type 2 diabetes mellitus without complications: Secondary | ICD-10-CM | POA: Insufficient documentation

## 2020-12-29 DIAGNOSIS — R609 Edema, unspecified: Secondary | ICD-10-CM | POA: Diagnosis not present

## 2020-12-29 DIAGNOSIS — S3992XA Unspecified injury of lower back, initial encounter: Secondary | ICD-10-CM | POA: Diagnosis present

## 2020-12-29 DIAGNOSIS — S22080A Wedge compression fracture of T11-T12 vertebra, initial encounter for closed fracture: Secondary | ICD-10-CM | POA: Diagnosis not present

## 2020-12-29 DIAGNOSIS — Z853 Personal history of malignant neoplasm of breast: Secondary | ICD-10-CM | POA: Insufficient documentation

## 2020-12-29 DIAGNOSIS — R279 Unspecified lack of coordination: Secondary | ICD-10-CM | POA: Diagnosis not present

## 2020-12-29 DIAGNOSIS — Z743 Need for continuous supervision: Secondary | ICD-10-CM | POA: Diagnosis not present

## 2020-12-29 DIAGNOSIS — S22000A Wedge compression fracture of unspecified thoracic vertebra, initial encounter for closed fracture: Secondary | ICD-10-CM

## 2020-12-29 LAB — BASIC METABOLIC PANEL
Anion gap: 8 (ref 5–15)
BUN: 7 mg/dL — ABNORMAL LOW (ref 8–23)
CO2: 27 mmol/L (ref 22–32)
Calcium: 9.3 mg/dL (ref 8.9–10.3)
Chloride: 102 mmol/L (ref 98–111)
Creatinine, Ser: 0.85 mg/dL (ref 0.44–1.00)
GFR, Estimated: >60 mL/min (ref >60)
Glucose, Bld: 169 mg/dL — ABNORMAL HIGH (ref 70–99)
Potassium: 4 mmol/L (ref 3.5–5.1)
Sodium: 137 mmol/L (ref 135–145)

## 2020-12-29 LAB — CBC WITH DIFFERENTIAL/PLATELET
Abs Immature Granulocytes: 0.06 10*3/uL (ref 0.00–0.07)
Basophils Absolute: 0.1 10*3/uL (ref 0.0–0.1)
Basophils Relative: 1 %
Eosinophils Absolute: 0.1 10*3/uL (ref 0.0–0.5)
Eosinophils Relative: 1 %
HCT: 35.7 % — ABNORMAL LOW (ref 36.0–46.0)
Hemoglobin: 11.3 g/dL — ABNORMAL LOW (ref 12.0–15.0)
Immature Granulocytes: 1 %
Lymphocytes Relative: 19 %
Lymphs Abs: 1.5 10*3/uL (ref 0.7–4.0)
MCH: 30 pg (ref 26.0–34.0)
MCHC: 31.7 g/dL (ref 30.0–36.0)
MCV: 94.7 fL (ref 80.0–100.0)
Monocytes Absolute: 0.5 10*3/uL (ref 0.1–1.0)
Monocytes Relative: 7 %
Neutro Abs: 5.5 10*3/uL (ref 1.7–7.7)
Neutrophils Relative %: 71 %
Platelets: 318 10*3/uL (ref 150–400)
RBC: 3.77 MIL/uL — ABNORMAL LOW (ref 3.87–5.11)
RDW: 13.2 % (ref 11.5–15.5)
WBC: 7.7 10*3/uL (ref 4.0–10.5)
nRBC: 0 % (ref 0.0–0.2)

## 2020-12-29 MED ORDER — TRAMADOL HCL 50 MG PO TABS: 50.0000 mg | ORAL_TABLET | Freq: Four times a day (QID) | ORAL | 0 refills | Status: DC | PRN

## 2020-12-29 MED ORDER — KETOROLAC TROMETHAMINE 30 MG/ML IJ SOLN
15.0000 mg | Freq: Once | INTRAMUSCULAR | Status: AC
  Administered 2020-12-29: 15 mg via INTRAVENOUS
  Filled 2020-12-29: qty 1

## 2020-12-29 MED ORDER — MORPHINE SULFATE (PF) 4 MG/ML IV SOLN
4.0000 mg | Freq: Once | INTRAVENOUS | Status: AC
  Administered 2020-12-29: 4 mg via INTRAVENOUS
  Filled 2020-12-29: qty 1

## 2020-12-29 MED ORDER — DIAZEPAM 5 MG/ML IJ SOLN: 2.5000 mg | Freq: Once | INTRAMUSCULAR | Status: DC

## 2020-12-29 NOTE — ED Notes (Signed)
Pt incontinent of urine at this time. Cleaned pt, changed linen, and provided with a new brief.

## 2020-12-29 NOTE — ED Notes (Signed)
PTAR at bedside 

## 2020-12-29 NOTE — ED Triage Notes (Signed)
Emergency Medicine Provider Triage Evaluation Note  Anna Hunt , a 75 y.o. female  was evaluated in triage.  Pt complains of fall, brought in by EMS, on Plavix, did not hit head. States she stood up to go get a cup of coffee, did not use her [place], did not feel lightheaded/dizzy, states she just fell. Landing on her back. C/O pain in her low back. No other injuries.  Review of Systems  Positive: Back pain Negative: Light headed/dizzy, neck or extremity pain  Physical Exam  There were no vitals taken for this visit. Gen:   Awake, no distress   HEENT:  Atraumatic  Resp:  Normal effort  Cardiac:  Normal rate  Abd:   Nondistended, nontender  MSK:   Moves extremities without difficulty, TTP across lower back Neuro:  Speech clear   Medical Decision Making  Medically screening exam initiated at 2:35 PM.  Appropriate orders placed.  Anna Hunt was informed that the remainder of the evaluation will be completed by another provider, this initial triage assessment does not replace that evaluation, and the importance of remaining in the ED until their evaluation is complete.  Clinical Impression     Anna Learn, PA-C 12/29/20 1445

## 2020-12-29 NOTE — ED Notes (Signed)
PLEASE CALL DAUG. FOR H11 WITH AN UPDATE HER NAME IS JOY [phone]

## 2020-12-29 NOTE — ED Notes (Signed)
PTAR called  

## 2020-12-29 NOTE — Discharge Instructions (Signed)
You have a compression fracture that was old.   Take some Tylenol for pain.  Take tramadol for severe pain  Please follow-up with Dr. Ellene Route in a week  Return to ER if you have worse back pain, unable to walk, weakness, numbness, trouble urinating.

## 2020-12-29 NOTE — ED Notes (Signed)
Family updated as to patient's status.

## 2020-12-29 NOTE — ED Notes (Signed)
PTAR being facilitated by [employer]

## 2020-12-29 NOTE — ED Provider Notes (Signed)
[facility] EMERGENCY DEPARTMENT Provider Note   CSN: [id number] Arrival date & time: 12/29/20  1433     History Chief Complaint  Patient presents with  . Fall    Anna Hunt is a 75 y.o. female history diabetes, hypertension, here presenting with fall.  Patient had a mechanical fall when she stood up and got a cup of coffee.  She states that she landed on her back.  She has some back pain afterwards.  Denies any head injury or loss of consciousness.  Patient has previous history of spinal compression fractures.  The history is provided by the patient.       Past Medical History:  Diagnosis Date  . Breast cancer ([place])   . Diabetes mellitus without complication ([place])   . Displacement of cervical intervertebral disc without myelopathy   . Family history of adverse reaction to anesthesia    per patient "younger sister was nauseated"  . History of depression   . Hypertension   . LBBB (left bundle branch block)   . 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 Unspecified cerebral artery occlusion with cerebral infarction   . Vertigo     Patient Active Problem List   Diagnosis Date Noted  . Malnutrition of moderate degree 11/05/2020  . Adult failure to thrive 11/02/2020  . Type 2 diabetes mellitus with hyperglycemia ([place]) 11/02/2020  . Normocytic anemia 11/02/2020  . Acute UTI (urinary tract infection) 11/02/2020  . Mild protein-calorie malnutrition ([place]) 11/02/2020  . Failure to thrive in adult 11/02/2020  . Urinary incontinence 10/11/2020  . Cervical myelopathy ([place]) 06/24/2020  . Genetic testing 06/23/2020  . Cerebrovascular accident (CVA) ([place]) 06/23/2020  . Gait abnormality 06/16/2020  . Malignant neoplasm of upper-inner quadrant of left breast in female, estrogen receptor positive ([place]) 06/10/2020  . TIA (transient ischemic attack) 11/16/2018  . HTN (hypertension), benign 11/16/2018  . LBBB (left bundle branch block)   . Diabetes mellitus without  complication ([place])   . Stroke ([place]) 01/22/2013  . Displacement of cervical intervertebral disc without myelopathy     Past Surgical History:  Procedure Laterality Date  . BACK SURGERY    . BREAST LUMPECTOMY WITH RADIOACTIVE SEED LOCALIZATION Left 09/15/2020   Procedure: LEFT BREAST LUMPECTOMY WITH RADIOACTIVE SEED LOCALIZATION;  Surgeon: Rolm Bookbinder, MD;  Location: [place];  Service: General;  Laterality: Left;  . CATARACT EXTRACTION W/ INTRAOCULAR LENS  IMPLANT, BILATERAL    . TUBAL LIGATION       OB History   No obstetric history on file.     Family History  Problem Relation Age of Onset  . Heart attack Sister 30  . Breast cancer Sister 65  . Breast cancer Sister 3    Social History   Tobacco Use  . Smoking status: Former Research scientist (life sciences)  . Smokeless tobacco: Never Used  . Tobacco comment: 12/29/2021per patient quit about 20 years ago  Vaping Use  . Vaping Use: Never used  Substance Use Topics  . Alcohol use: No  . Drug use: No    Home Medications Prior to Admission medications   Medication Sig Start Date End Date Taking? Authorizing Provider  acetaminophen (TYLENOL) 500 MG tablet Take 1,000 mg by mouth every 6 (six) hours as needed for mild pain.    [provider]  anastrozole (ARIMIDEX) 1 MG tablet Take 1 tablet (1 mg total) by mouth daily. 08/24/20   Gardenia Phlegm, NP  BD PEN NEEDLE NANO U/F 32G X  4 MM MISC  12/26/12   [provider]  Cholecalciferol (VITAMIN D) 2000 UNITS CAPS Take 2,000 Units by mouth every morning.     [provider]  clopidogrel (PLAVIX) 75 MG tablet Take 1 tablet (75 mg total) by mouth daily. 11/17/18   Thurnell Lose, MD  divalproex (DEPAKOTE ER) 500 MG 24 hr tablet Take 1 tablet (500 mg total) by mouth at bedtime. 06/23/20   Marcial Pacas, MD  metFORMIN (GLUCOPHAGE-XR) 500 MG 24 hr tablet Take 1,000 mg by mouth at bedtime. 01/05/13   [provider]  Multiple Vitamin (MULTIVITAMIN WITH MINERALS)  TABS tablet Take 1 tablet by mouth daily.    [provider]  PARoxetine (PAXIL) 20 MG tablet Take 20 mg by mouth at bedtime.  01/05/13   [provider]  simvastatin (ZOCOR) 40 MG tablet Take 40 mg by mouth at bedtime.  01/05/13   [provider]  telmisartan (MICARDIS) 40 MG tablet Take 20 mg by mouth daily. 09/13/20   [provider]    Allergies    Niaspan [niacin er]  Review of Systems   Review of Systems  Musculoskeletal: Positive for back pain.  All other systems reviewed and are negative.   Physical Exam Updated Vital Signs BP (!) 148/98 (BP Location: Right Arm)   Pulse 80   Temp 99 F (37.2 C) (Oral)   Resp 16   Ht 5\' 2"  (1.575 m)   Wt 72.6 kg   SpO2 99%   BMI 29.27 kg/m   Physical Exam Vitals and nursing note reviewed.  Constitutional:      Appearance: Normal appearance.     Comments: Slightly uncomfortable  HENT:     Head: Normocephalic.     Nose: Nose normal.     Mouth/Throat:     Mouth: Mucous membranes are moist.  Eyes:     Extraocular Movements: Extraocular movements intact.     Pupils: Pupils are equal, round, and reactive to light.  Cardiovascular:     Rate and Rhythm: Normal rate and regular rhythm.     Pulses: Normal pulses.     Heart sounds: Normal heart sounds.  Pulmonary:     Effort: Pulmonary effort is normal.     Breath sounds: Normal breath sounds.  Abdominal:     General: Abdomen is flat.     Palpations: Abdomen is soft.  Musculoskeletal:     Cervical back: Normal range of motion and neck supple.     Comments: Mild upper lumbar tenderness.  Skin:    General: Skin is warm.     Capillary Refill: Capillary refill takes less than 2 seconds.  Neurological:     General: No focal deficit present.     Mental Status: She is alert and oriented to person, place, and time.     Comments: No saddle anesthesia, nl strength and sensation and reflexes bilateral lower extremities   Psychiatric:        Mood and  Affect: Mood normal.        Behavior: Behavior normal.     ED Results / Procedures / Treatments   Labs (all labs ordered are listed, but only abnormal results are displayed) Labs Reviewed  BASIC METABOLIC PANEL - Abnormal; Notable for the following components:      Result Value   Glucose, Bld 169 (*)    BUN 7 (*)    All other components within normal limits  CBC WITH DIFFERENTIAL/PLATELET - Abnormal; Notable for the following  components:   RBC 3.77 (*)    Hemoglobin 11.3 (*)    HCT 35.7 (*)    All other components within normal limits  URINALYSIS, ROUTINE W REFLEX MICROSCOPIC    EKG EKG Interpretation  Date/Time:  Thursday December 29 2020 14:46:04 EDT Ventricular Rate:  79 PR Interval:  142 QRS Duration: 130 QT Interval:  402 QTC Calculation: 460 R Axis:   -12 Text Interpretation: Normal sinus rhythm Non-specific intra-ventricular conduction block Minimal voltage criteria for LVH, may be normal variant ( Cornell product ) Cannot rule out Anterior infarct , age undetermined Abnormal ECG since last tracing no significant change Confirmed by Noemi Chapel [phone]) on 12/29/2020 2:59:03 PM   Radiology DG Lumbar Spine Complete  Result Date: 12/29/2020 CLINICAL DATA:  Fall with back pain EXAM: LUMBAR SPINE - COMPLETE 4+ VIEW COMPARISON:  11/01/2020 FINDINGS: Vascular calcifications in the left upper quadrant. Hypoplastic ribs at T12. 5 non rib-bearing lumbar type vertebra. Coarse calcification in the pelvis probably a calcified uterine fibroid. Bones appear diffusely demineralized. Aortic atherosclerosis. 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 Remaining vertebral bodies demonstrate normal stature. Moderate disc space narrowing and degenerative change at L5-S1. Facet degenerative changes of the lower lumbar spine. 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 Electronically Signed   By: Donavan Foil M.D.   On: 12/29/2020 15:32    Procedures Procedures   Medications Ordered in ED Medications  morphine 4 MG/ML injection 4 mg (4 mg Intravenous Given 12/29/20 1958)  ketorolac (TORADOL) 30 MG/ML injection 15 mg (15 mg Intravenous Given 12/29/20 1958)    ED Course  I have reviewed the triage vital signs and the nursing notes.  Pertinent labs & imaging results that were available during my care of the patient were reviewed by me and considered in my medical decision making (see chart for details).    MDM Rules/Calculators/A&P                         KARISSA MEENAN is a 75 y.o. female here with fall with back pain.  Had mechanical fall and had back pain afterwards.  Patient is neurovascularly intact.  Will get x-rays  8:26 PM X-ray showed chronic compression fracture of T12 with slightly decreased vertebral height.  However there is no acute fracture.  Patient's pain is under control with pain medicine.  She follows up with Dr. Ellene Route.  Will prescribe tramadol as needed for pain  Final Clinical Impression(s) / ED Diagnoses Final diagnoses:  None    Rx / DC Orders ED Discharge Orders    None       Drenda Freeze, MD 12/29/20 2026

## 2020-12-29 NOTE — ED Triage Notes (Signed)
Pt presents  To the ED from [facility] after a fall. Denies LOC. No head strike. Pt on thinners. VSS en route with EMS. No neck pain. Pt states she was getting coffee when he legs gave out causing her to fall.

## 2021-01-03 DIAGNOSIS — S22009A Unspecified fracture of unspecified thoracic vertebra, initial encounter for closed fracture: Secondary | ICD-10-CM | POA: Diagnosis not present

## 2021-01-05 ENCOUNTER — Telehealth: Payer: Self-pay

## 2021-01-05 NOTE — Telephone Encounter (Signed)
Returned call to daughter. Daughter wanted inform MD that pt is now in an Assisted living facility and not doing well. Daughter states that pt has had multiple falls and is requesting pt stop anastrozole to decrease risk of fracture. Pt is confused and unable to care for self or administer her own medication. Daughter wanted to ask MD if ok to stop. Dicussed with Dr Jana Hakim and he does not want pt to stop at this time. MD said there are options for bone strengthening and pt can be brought in to discuss options if the family wants.

## 2021-01-06 DIAGNOSIS — E038 Other specified hypothyroidism: Secondary | ICD-10-CM | POA: Diagnosis not present

## 2021-01-06 DIAGNOSIS — I10 Essential (primary) hypertension: Secondary | ICD-10-CM | POA: Diagnosis not present

## 2021-01-06 DIAGNOSIS — E119 Type 2 diabetes mellitus without complications: Secondary | ICD-10-CM | POA: Diagnosis not present

## 2021-01-06 DIAGNOSIS — E7849 Other hyperlipidemia: Secondary | ICD-10-CM | POA: Diagnosis not present

## 2021-01-06 DIAGNOSIS — Z79899 Other long term (current) drug therapy: Secondary | ICD-10-CM | POA: Diagnosis not present

## 2021-01-06 DIAGNOSIS — E559 Vitamin D deficiency, unspecified: Secondary | ICD-10-CM | POA: Diagnosis not present

## 2021-01-09 DIAGNOSIS — R296 Repeated falls: Secondary | ICD-10-CM | POA: Diagnosis not present

## 2021-01-09 DIAGNOSIS — R2681 Unsteadiness on feet: Secondary | ICD-10-CM | POA: Diagnosis not present

## 2021-01-09 DIAGNOSIS — D649 Anemia, unspecified: Secondary | ICD-10-CM | POA: Diagnosis not present

## 2021-01-09 DIAGNOSIS — E1165 Type 2 diabetes mellitus with hyperglycemia: Secondary | ICD-10-CM | POA: Diagnosis not present

## 2021-01-09 DIAGNOSIS — E440 Moderate protein-calorie malnutrition: Secondary | ICD-10-CM | POA: Diagnosis not present

## 2021-01-09 DIAGNOSIS — R627 Adult failure to thrive: Secondary | ICD-10-CM | POA: Diagnosis not present

## 2021-01-11 DIAGNOSIS — R2681 Unsteadiness on feet: Secondary | ICD-10-CM | POA: Diagnosis not present

## 2021-01-11 DIAGNOSIS — Z853 Personal history of malignant neoplasm of breast: Secondary | ICD-10-CM | POA: Diagnosis not present

## 2021-01-11 DIAGNOSIS — Z7984 Long term (current) use of oral hypoglycemic drugs: Secondary | ICD-10-CM | POA: Diagnosis not present

## 2021-01-11 DIAGNOSIS — E440 Moderate protein-calorie malnutrition: Secondary | ICD-10-CM | POA: Diagnosis not present

## 2021-01-11 DIAGNOSIS — D649 Anemia, unspecified: Secondary | ICD-10-CM | POA: Diagnosis not present

## 2021-01-11 DIAGNOSIS — E1165 Type 2 diabetes mellitus with hyperglycemia: Secondary | ICD-10-CM | POA: Diagnosis not present

## 2021-01-11 DIAGNOSIS — M6281 Muscle weakness (generalized): Secondary | ICD-10-CM | POA: Diagnosis not present

## 2021-01-11 DIAGNOSIS — M5020 Other cervical disc displacement, unspecified cervical region: Secondary | ICD-10-CM | POA: Diagnosis not present

## 2021-01-11 DIAGNOSIS — S22080D Wedge compression fracture of T11-T12 vertebra, subsequent encounter for fracture with routine healing: Secondary | ICD-10-CM | POA: Diagnosis not present

## 2021-01-11 DIAGNOSIS — Z794 Long term (current) use of insulin: Secondary | ICD-10-CM | POA: Diagnosis not present

## 2021-01-11 DIAGNOSIS — Z8673 Personal history of transient ischemic attack (TIA), and cerebral infarction without residual deficits: Secondary | ICD-10-CM | POA: Diagnosis not present

## 2021-01-11 DIAGNOSIS — R627 Adult failure to thrive: Secondary | ICD-10-CM | POA: Diagnosis not present

## 2021-01-11 DIAGNOSIS — R296 Repeated falls: Secondary | ICD-10-CM | POA: Diagnosis not present

## 2021-01-12 DIAGNOSIS — R609 Edema, unspecified: Secondary | ICD-10-CM | POA: Diagnosis not present

## 2021-01-18 DIAGNOSIS — F321 Major depressive disorder, single episode, moderate: Secondary | ICD-10-CM | POA: Diagnosis not present

## 2021-01-18 DIAGNOSIS — G4700 Insomnia, unspecified: Secondary | ICD-10-CM | POA: Diagnosis not present

## 2021-01-18 DIAGNOSIS — F3112 Bipolar disorder, current episode manic without psychotic features, moderate: Secondary | ICD-10-CM | POA: Diagnosis not present

## 2021-01-20 DIAGNOSIS — S22080G Wedge compression fracture of T11-T12 vertebra, subsequent encounter for fracture with delayed healing: Secondary | ICD-10-CM | POA: Diagnosis not present

## 2021-01-20 DIAGNOSIS — S22080A Wedge compression fracture of T11-T12 vertebra, initial encounter for closed fracture: Secondary | ICD-10-CM | POA: Diagnosis not present

## 2021-01-25 DIAGNOSIS — R627 Adult failure to thrive: Secondary | ICD-10-CM | POA: Diagnosis not present

## 2021-01-25 DIAGNOSIS — E440 Moderate protein-calorie malnutrition: Secondary | ICD-10-CM | POA: Diagnosis not present

## 2021-01-25 DIAGNOSIS — R2681 Unsteadiness on feet: Secondary | ICD-10-CM | POA: Diagnosis not present

## 2021-01-25 DIAGNOSIS — R296 Repeated falls: Secondary | ICD-10-CM | POA: Diagnosis not present

## 2021-01-25 DIAGNOSIS — E1165 Type 2 diabetes mellitus with hyperglycemia: Secondary | ICD-10-CM | POA: Diagnosis not present

## 2021-01-25 DIAGNOSIS — D649 Anemia, unspecified: Secondary | ICD-10-CM | POA: Diagnosis not present

## 2021-01-26 DIAGNOSIS — E1165 Type 2 diabetes mellitus with hyperglycemia: Secondary | ICD-10-CM | POA: Diagnosis not present

## 2021-01-26 DIAGNOSIS — R627 Adult failure to thrive: Secondary | ICD-10-CM | POA: Diagnosis not present

## 2021-01-26 DIAGNOSIS — E440 Moderate protein-calorie malnutrition: Secondary | ICD-10-CM | POA: Diagnosis not present

## 2021-01-26 DIAGNOSIS — D649 Anemia, unspecified: Secondary | ICD-10-CM | POA: Diagnosis not present

## 2021-01-26 DIAGNOSIS — R2681 Unsteadiness on feet: Secondary | ICD-10-CM | POA: Diagnosis not present

## 2021-01-26 DIAGNOSIS — R296 Repeated falls: Secondary | ICD-10-CM | POA: Diagnosis not present

## 2021-01-30 DIAGNOSIS — R2681 Unsteadiness on feet: Secondary | ICD-10-CM | POA: Diagnosis not present

## 2021-01-30 DIAGNOSIS — R627 Adult failure to thrive: Secondary | ICD-10-CM | POA: Diagnosis not present

## 2021-01-30 DIAGNOSIS — R296 Repeated falls: Secondary | ICD-10-CM | POA: Diagnosis not present

## 2021-01-30 DIAGNOSIS — E440 Moderate protein-calorie malnutrition: Secondary | ICD-10-CM | POA: Diagnosis not present

## 2021-01-30 DIAGNOSIS — E1165 Type 2 diabetes mellitus with hyperglycemia: Secondary | ICD-10-CM | POA: Diagnosis not present

## 2021-01-30 DIAGNOSIS — D649 Anemia, unspecified: Secondary | ICD-10-CM | POA: Diagnosis not present

## 2021-01-31 DIAGNOSIS — S0990XA Unspecified injury of head, initial encounter: Secondary | ICD-10-CM | POA: Diagnosis not present

## 2021-01-31 DIAGNOSIS — R0902 Hypoxemia: Secondary | ICD-10-CM | POA: Diagnosis not present

## 2021-01-31 DIAGNOSIS — S0001XA Abrasion of scalp, initial encounter: Secondary | ICD-10-CM | POA: Diagnosis not present

## 2021-01-31 DIAGNOSIS — I6522 Occlusion and stenosis of left carotid artery: Secondary | ICD-10-CM | POA: Diagnosis not present

## 2021-01-31 DIAGNOSIS — S0003XA Contusion of scalp, initial encounter: Secondary | ICD-10-CM | POA: Diagnosis not present

## 2021-01-31 DIAGNOSIS — M47812 Spondylosis without myelopathy or radiculopathy, cervical region: Secondary | ICD-10-CM | POA: Diagnosis not present

## 2021-01-31 DIAGNOSIS — S199XXA Unspecified injury of neck, initial encounter: Secondary | ICD-10-CM | POA: Diagnosis not present

## 2021-01-31 DIAGNOSIS — W01198A Fall on same level from slipping, tripping and stumbling with subsequent striking against other object, initial encounter: Secondary | ICD-10-CM | POA: Diagnosis not present

## 2021-01-31 DIAGNOSIS — Z9181 History of falling: Secondary | ICD-10-CM | POA: Diagnosis not present

## 2021-01-31 DIAGNOSIS — Y998 Other external cause status: Secondary | ICD-10-CM | POA: Diagnosis not present

## 2021-01-31 DIAGNOSIS — I6381 Other cerebral infarction due to occlusion or stenosis of small artery: Secondary | ICD-10-CM | POA: Diagnosis not present

## 2021-01-31 DIAGNOSIS — I739 Peripheral vascular disease, unspecified: Secondary | ICD-10-CM | POA: Diagnosis not present

## 2021-01-31 DIAGNOSIS — W19XXXA Unspecified fall, initial encounter: Secondary | ICD-10-CM | POA: Diagnosis not present

## 2021-02-01 DIAGNOSIS — R531 Weakness: Secondary | ICD-10-CM | POA: Diagnosis not present

## 2021-02-01 DIAGNOSIS — R5381 Other malaise: Secondary | ICD-10-CM | POA: Diagnosis not present

## 2021-02-01 DIAGNOSIS — Z7401 Bed confinement status: Secondary | ICD-10-CM | POA: Diagnosis not present

## 2021-02-01 DIAGNOSIS — R011 Cardiac murmur, unspecified: Secondary | ICD-10-CM | POA: Diagnosis not present

## 2021-02-01 DIAGNOSIS — W19XXXA Unspecified fall, initial encounter: Secondary | ICD-10-CM | POA: Diagnosis not present

## 2021-02-02 DIAGNOSIS — E440 Moderate protein-calorie malnutrition: Secondary | ICD-10-CM | POA: Diagnosis not present

## 2021-02-02 DIAGNOSIS — R627 Adult failure to thrive: Secondary | ICD-10-CM | POA: Diagnosis not present

## 2021-02-02 DIAGNOSIS — E1165 Type 2 diabetes mellitus with hyperglycemia: Secondary | ICD-10-CM | POA: Diagnosis not present

## 2021-02-02 DIAGNOSIS — R2681 Unsteadiness on feet: Secondary | ICD-10-CM | POA: Diagnosis not present

## 2021-02-02 DIAGNOSIS — D649 Anemia, unspecified: Secondary | ICD-10-CM | POA: Diagnosis not present

## 2021-02-02 DIAGNOSIS — R296 Repeated falls: Secondary | ICD-10-CM | POA: Diagnosis not present

## 2021-02-04 DIAGNOSIS — C50919 Malignant neoplasm of unspecified site of unspecified female breast: Secondary | ICD-10-CM | POA: Diagnosis not present

## 2021-02-04 DIAGNOSIS — E559 Vitamin D deficiency, unspecified: Secondary | ICD-10-CM | POA: Diagnosis not present

## 2021-02-04 DIAGNOSIS — I10 Essential (primary) hypertension: Secondary | ICD-10-CM | POA: Diagnosis not present

## 2021-02-04 DIAGNOSIS — E119 Type 2 diabetes mellitus without complications: Secondary | ICD-10-CM | POA: Diagnosis not present

## 2021-02-04 DIAGNOSIS — E038 Other specified hypothyroidism: Secondary | ICD-10-CM | POA: Diagnosis not present

## 2021-02-04 DIAGNOSIS — D649 Anemia, unspecified: Secondary | ICD-10-CM | POA: Diagnosis not present

## 2021-02-04 DIAGNOSIS — G459 Transient cerebral ischemic attack, unspecified: Secondary | ICD-10-CM | POA: Diagnosis not present

## 2021-02-07 DIAGNOSIS — R627 Adult failure to thrive: Secondary | ICD-10-CM | POA: Diagnosis not present

## 2021-02-07 DIAGNOSIS — R2681 Unsteadiness on feet: Secondary | ICD-10-CM | POA: Diagnosis not present

## 2021-02-07 DIAGNOSIS — E1165 Type 2 diabetes mellitus with hyperglycemia: Secondary | ICD-10-CM | POA: Diagnosis not present

## 2021-02-07 DIAGNOSIS — E440 Moderate protein-calorie malnutrition: Secondary | ICD-10-CM | POA: Diagnosis not present

## 2021-02-07 DIAGNOSIS — R296 Repeated falls: Secondary | ICD-10-CM | POA: Diagnosis not present

## 2021-02-07 DIAGNOSIS — D649 Anemia, unspecified: Secondary | ICD-10-CM | POA: Diagnosis not present

## 2021-02-08 DIAGNOSIS — R627 Adult failure to thrive: Secondary | ICD-10-CM | POA: Diagnosis not present

## 2021-02-08 DIAGNOSIS — D649 Anemia, unspecified: Secondary | ICD-10-CM | POA: Diagnosis not present

## 2021-02-08 DIAGNOSIS — R296 Repeated falls: Secondary | ICD-10-CM | POA: Diagnosis not present

## 2021-02-08 DIAGNOSIS — R2681 Unsteadiness on feet: Secondary | ICD-10-CM | POA: Diagnosis not present

## 2021-02-08 DIAGNOSIS — E1165 Type 2 diabetes mellitus with hyperglycemia: Secondary | ICD-10-CM | POA: Diagnosis not present

## 2021-02-08 DIAGNOSIS — E440 Moderate protein-calorie malnutrition: Secondary | ICD-10-CM | POA: Diagnosis not present

## 2021-02-10 DIAGNOSIS — Z853 Personal history of malignant neoplasm of breast: Secondary | ICD-10-CM | POA: Diagnosis not present

## 2021-02-10 DIAGNOSIS — M5020 Other cervical disc displacement, unspecified cervical region: Secondary | ICD-10-CM | POA: Diagnosis not present

## 2021-02-10 DIAGNOSIS — Z8673 Personal history of transient ischemic attack (TIA), and cerebral infarction without residual deficits: Secondary | ICD-10-CM | POA: Diagnosis not present

## 2021-02-10 DIAGNOSIS — E119 Type 2 diabetes mellitus without complications: Secondary | ICD-10-CM | POA: Diagnosis not present

## 2021-02-10 DIAGNOSIS — R296 Repeated falls: Secondary | ICD-10-CM | POA: Diagnosis not present

## 2021-02-10 DIAGNOSIS — S22080D Wedge compression fracture of T11-T12 vertebra, subsequent encounter for fracture with routine healing: Secondary | ICD-10-CM | POA: Diagnosis not present

## 2021-02-10 DIAGNOSIS — D649 Anemia, unspecified: Secondary | ICD-10-CM | POA: Diagnosis not present

## 2021-02-10 DIAGNOSIS — Z794 Long term (current) use of insulin: Secondary | ICD-10-CM | POA: Diagnosis not present

## 2021-02-10 DIAGNOSIS — R627 Adult failure to thrive: Secondary | ICD-10-CM | POA: Diagnosis not present

## 2021-02-10 DIAGNOSIS — E440 Moderate protein-calorie malnutrition: Secondary | ICD-10-CM | POA: Diagnosis not present

## 2021-02-10 DIAGNOSIS — Z7984 Long term (current) use of oral hypoglycemic drugs: Secondary | ICD-10-CM | POA: Diagnosis not present

## 2021-02-10 DIAGNOSIS — R2681 Unsteadiness on feet: Secondary | ICD-10-CM | POA: Diagnosis not present

## 2021-02-10 DIAGNOSIS — M6281 Muscle weakness (generalized): Secondary | ICD-10-CM | POA: Diagnosis not present

## 2021-02-13 DIAGNOSIS — R2681 Unsteadiness on feet: Secondary | ICD-10-CM | POA: Diagnosis not present

## 2021-02-13 DIAGNOSIS — R627 Adult failure to thrive: Secondary | ICD-10-CM | POA: Diagnosis not present

## 2021-02-13 DIAGNOSIS — D649 Anemia, unspecified: Secondary | ICD-10-CM | POA: Diagnosis not present

## 2021-02-13 DIAGNOSIS — S22080D Wedge compression fracture of T11-T12 vertebra, subsequent encounter for fracture with routine healing: Secondary | ICD-10-CM | POA: Diagnosis not present

## 2021-02-13 DIAGNOSIS — E440 Moderate protein-calorie malnutrition: Secondary | ICD-10-CM | POA: Diagnosis not present

## 2021-02-13 DIAGNOSIS — M5020 Other cervical disc displacement, unspecified cervical region: Secondary | ICD-10-CM | POA: Diagnosis not present

## 2021-02-15 DIAGNOSIS — E440 Moderate protein-calorie malnutrition: Secondary | ICD-10-CM | POA: Diagnosis not present

## 2021-02-15 DIAGNOSIS — R2681 Unsteadiness on feet: Secondary | ICD-10-CM | POA: Diagnosis not present

## 2021-02-15 DIAGNOSIS — D649 Anemia, unspecified: Secondary | ICD-10-CM | POA: Diagnosis not present

## 2021-02-15 DIAGNOSIS — R627 Adult failure to thrive: Secondary | ICD-10-CM | POA: Diagnosis not present

## 2021-02-15 DIAGNOSIS — S22080D Wedge compression fracture of T11-T12 vertebra, subsequent encounter for fracture with routine healing: Secondary | ICD-10-CM | POA: Diagnosis not present

## 2021-02-15 DIAGNOSIS — M5020 Other cervical disc displacement, unspecified cervical region: Secondary | ICD-10-CM | POA: Diagnosis not present

## 2021-02-16 DIAGNOSIS — F321 Major depressive disorder, single episode, moderate: Secondary | ICD-10-CM | POA: Diagnosis not present

## 2021-02-16 DIAGNOSIS — G459 Transient cerebral ischemic attack, unspecified: Secondary | ICD-10-CM | POA: Diagnosis not present

## 2021-02-16 DIAGNOSIS — E119 Type 2 diabetes mellitus without complications: Secondary | ICD-10-CM | POA: Diagnosis not present

## 2021-02-16 DIAGNOSIS — E559 Vitamin D deficiency, unspecified: Secondary | ICD-10-CM | POA: Diagnosis not present

## 2021-02-16 DIAGNOSIS — I10 Essential (primary) hypertension: Secondary | ICD-10-CM | POA: Diagnosis not present

## 2021-02-16 DIAGNOSIS — D649 Anemia, unspecified: Secondary | ICD-10-CM | POA: Diagnosis not present

## 2021-02-16 DIAGNOSIS — F3112 Bipolar disorder, current episode manic without psychotic features, moderate: Secondary | ICD-10-CM | POA: Diagnosis not present

## 2021-02-16 DIAGNOSIS — R42 Dizziness and giddiness: Secondary | ICD-10-CM | POA: Diagnosis not present

## 2021-02-17 DIAGNOSIS — F321 Major depressive disorder, single episode, moderate: Secondary | ICD-10-CM | POA: Diagnosis not present

## 2021-02-17 DIAGNOSIS — G4700 Insomnia, unspecified: Secondary | ICD-10-CM | POA: Diagnosis not present

## 2021-02-17 DIAGNOSIS — F3112 Bipolar disorder, current episode manic without psychotic features, moderate: Secondary | ICD-10-CM | POA: Diagnosis not present

## 2021-02-20 DIAGNOSIS — E440 Moderate protein-calorie malnutrition: Secondary | ICD-10-CM | POA: Diagnosis not present

## 2021-02-20 DIAGNOSIS — M5020 Other cervical disc displacement, unspecified cervical region: Secondary | ICD-10-CM | POA: Diagnosis not present

## 2021-02-20 DIAGNOSIS — D649 Anemia, unspecified: Secondary | ICD-10-CM | POA: Diagnosis not present

## 2021-02-20 DIAGNOSIS — S22080D Wedge compression fracture of T11-T12 vertebra, subsequent encounter for fracture with routine healing: Secondary | ICD-10-CM | POA: Diagnosis not present

## 2021-02-20 DIAGNOSIS — R627 Adult failure to thrive: Secondary | ICD-10-CM | POA: Diagnosis not present

## 2021-02-20 DIAGNOSIS — R2681 Unsteadiness on feet: Secondary | ICD-10-CM | POA: Diagnosis not present

## 2021-02-22 DIAGNOSIS — E440 Moderate protein-calorie malnutrition: Secondary | ICD-10-CM | POA: Diagnosis not present

## 2021-02-22 DIAGNOSIS — R627 Adult failure to thrive: Secondary | ICD-10-CM | POA: Diagnosis not present

## 2021-02-22 DIAGNOSIS — R2681 Unsteadiness on feet: Secondary | ICD-10-CM | POA: Diagnosis not present

## 2021-02-22 DIAGNOSIS — D649 Anemia, unspecified: Secondary | ICD-10-CM | POA: Diagnosis not present

## 2021-02-22 DIAGNOSIS — M5020 Other cervical disc displacement, unspecified cervical region: Secondary | ICD-10-CM | POA: Diagnosis not present

## 2021-02-22 DIAGNOSIS — S22080D Wedge compression fracture of T11-T12 vertebra, subsequent encounter for fracture with routine healing: Secondary | ICD-10-CM | POA: Diagnosis not present

## 2021-03-01 DIAGNOSIS — D649 Anemia, unspecified: Secondary | ICD-10-CM | POA: Diagnosis not present

## 2021-03-01 DIAGNOSIS — E440 Moderate protein-calorie malnutrition: Secondary | ICD-10-CM | POA: Diagnosis not present

## 2021-03-01 DIAGNOSIS — R627 Adult failure to thrive: Secondary | ICD-10-CM | POA: Diagnosis not present

## 2021-03-01 DIAGNOSIS — R2681 Unsteadiness on feet: Secondary | ICD-10-CM | POA: Diagnosis not present

## 2021-03-01 DIAGNOSIS — S22080D Wedge compression fracture of T11-T12 vertebra, subsequent encounter for fracture with routine healing: Secondary | ICD-10-CM | POA: Diagnosis not present

## 2021-03-01 DIAGNOSIS — M5020 Other cervical disc displacement, unspecified cervical region: Secondary | ICD-10-CM | POA: Diagnosis not present

## 2021-03-03 DIAGNOSIS — R627 Adult failure to thrive: Secondary | ICD-10-CM | POA: Diagnosis not present

## 2021-03-03 DIAGNOSIS — S22080D Wedge compression fracture of T11-T12 vertebra, subsequent encounter for fracture with routine healing: Secondary | ICD-10-CM | POA: Diagnosis not present

## 2021-03-03 DIAGNOSIS — M5020 Other cervical disc displacement, unspecified cervical region: Secondary | ICD-10-CM | POA: Diagnosis not present

## 2021-03-03 DIAGNOSIS — D649 Anemia, unspecified: Secondary | ICD-10-CM | POA: Diagnosis not present

## 2021-03-03 DIAGNOSIS — R2681 Unsteadiness on feet: Secondary | ICD-10-CM | POA: Diagnosis not present

## 2021-03-03 DIAGNOSIS — E440 Moderate protein-calorie malnutrition: Secondary | ICD-10-CM | POA: Diagnosis not present

## 2021-03-06 DIAGNOSIS — R627 Adult failure to thrive: Secondary | ICD-10-CM | POA: Diagnosis not present

## 2021-03-06 DIAGNOSIS — D649 Anemia, unspecified: Secondary | ICD-10-CM | POA: Diagnosis not present

## 2021-03-06 DIAGNOSIS — R2681 Unsteadiness on feet: Secondary | ICD-10-CM | POA: Diagnosis not present

## 2021-03-06 DIAGNOSIS — E440 Moderate protein-calorie malnutrition: Secondary | ICD-10-CM | POA: Diagnosis not present

## 2021-03-06 DIAGNOSIS — M5020 Other cervical disc displacement, unspecified cervical region: Secondary | ICD-10-CM | POA: Diagnosis not present

## 2021-03-06 DIAGNOSIS — S22080D Wedge compression fracture of T11-T12 vertebra, subsequent encounter for fracture with routine healing: Secondary | ICD-10-CM | POA: Diagnosis not present

## 2021-03-07 DIAGNOSIS — R058 Other specified cough: Secondary | ICD-10-CM | POA: Diagnosis not present

## 2021-03-09 DIAGNOSIS — E559 Vitamin D deficiency, unspecified: Secondary | ICD-10-CM | POA: Diagnosis not present

## 2021-03-09 DIAGNOSIS — D649 Anemia, unspecified: Secondary | ICD-10-CM | POA: Diagnosis not present

## 2021-03-09 DIAGNOSIS — E119 Type 2 diabetes mellitus without complications: Secondary | ICD-10-CM | POA: Diagnosis not present

## 2021-03-09 DIAGNOSIS — E038 Other specified hypothyroidism: Secondary | ICD-10-CM | POA: Diagnosis not present

## 2021-03-09 DIAGNOSIS — I10 Essential (primary) hypertension: Secondary | ICD-10-CM | POA: Diagnosis not present

## 2021-03-09 DIAGNOSIS — G459 Transient cerebral ischemic attack, unspecified: Secondary | ICD-10-CM | POA: Diagnosis not present

## 2021-03-09 DIAGNOSIS — C50919 Malignant neoplasm of unspecified site of unspecified female breast: Secondary | ICD-10-CM | POA: Diagnosis not present

## 2021-03-11 DIAGNOSIS — I10 Essential (primary) hypertension: Secondary | ICD-10-CM | POA: Diagnosis not present

## 2021-03-12 DIAGNOSIS — Z8673 Personal history of transient ischemic attack (TIA), and cerebral infarction without residual deficits: Secondary | ICD-10-CM | POA: Diagnosis not present

## 2021-03-12 DIAGNOSIS — Z794 Long term (current) use of insulin: Secondary | ICD-10-CM | POA: Diagnosis not present

## 2021-03-12 DIAGNOSIS — Z853 Personal history of malignant neoplasm of breast: Secondary | ICD-10-CM | POA: Diagnosis not present

## 2021-03-12 DIAGNOSIS — R2681 Unsteadiness on feet: Secondary | ICD-10-CM | POA: Diagnosis not present

## 2021-03-12 DIAGNOSIS — R296 Repeated falls: Secondary | ICD-10-CM | POA: Diagnosis not present

## 2021-03-12 DIAGNOSIS — R627 Adult failure to thrive: Secondary | ICD-10-CM | POA: Diagnosis not present

## 2021-03-12 DIAGNOSIS — S22080D Wedge compression fracture of T11-T12 vertebra, subsequent encounter for fracture with routine healing: Secondary | ICD-10-CM | POA: Diagnosis not present

## 2021-03-12 DIAGNOSIS — E440 Moderate protein-calorie malnutrition: Secondary | ICD-10-CM | POA: Diagnosis not present

## 2021-03-12 DIAGNOSIS — M5020 Other cervical disc displacement, unspecified cervical region: Secondary | ICD-10-CM | POA: Diagnosis not present

## 2021-03-12 DIAGNOSIS — E119 Type 2 diabetes mellitus without complications: Secondary | ICD-10-CM | POA: Diagnosis not present

## 2021-03-12 DIAGNOSIS — M6281 Muscle weakness (generalized): Secondary | ICD-10-CM | POA: Diagnosis not present

## 2021-03-12 DIAGNOSIS — D649 Anemia, unspecified: Secondary | ICD-10-CM | POA: Diagnosis not present

## 2021-03-12 DIAGNOSIS — Z7984 Long term (current) use of oral hypoglycemic drugs: Secondary | ICD-10-CM | POA: Diagnosis not present

## 2021-03-16 DIAGNOSIS — S22080D Wedge compression fracture of T11-T12 vertebra, subsequent encounter for fracture with routine healing: Secondary | ICD-10-CM | POA: Diagnosis not present

## 2021-03-16 DIAGNOSIS — R627 Adult failure to thrive: Secondary | ICD-10-CM | POA: Diagnosis not present

## 2021-03-16 DIAGNOSIS — C50919 Malignant neoplasm of unspecified site of unspecified female breast: Secondary | ICD-10-CM | POA: Diagnosis not present

## 2021-03-16 DIAGNOSIS — I10 Essential (primary) hypertension: Secondary | ICD-10-CM | POA: Diagnosis not present

## 2021-03-16 DIAGNOSIS — E559 Vitamin D deficiency, unspecified: Secondary | ICD-10-CM | POA: Diagnosis not present

## 2021-03-16 DIAGNOSIS — R2681 Unsteadiness on feet: Secondary | ICD-10-CM | POA: Diagnosis not present

## 2021-03-16 DIAGNOSIS — E440 Moderate protein-calorie malnutrition: Secondary | ICD-10-CM | POA: Diagnosis not present

## 2021-03-16 DIAGNOSIS — D649 Anemia, unspecified: Secondary | ICD-10-CM | POA: Diagnosis not present

## 2021-03-16 DIAGNOSIS — M5020 Other cervical disc displacement, unspecified cervical region: Secondary | ICD-10-CM | POA: Diagnosis not present

## 2021-03-16 DIAGNOSIS — R42 Dizziness and giddiness: Secondary | ICD-10-CM | POA: Diagnosis not present

## 2021-03-16 DIAGNOSIS — E119 Type 2 diabetes mellitus without complications: Secondary | ICD-10-CM | POA: Diagnosis not present

## 2021-03-20 DIAGNOSIS — M5020 Other cervical disc displacement, unspecified cervical region: Secondary | ICD-10-CM | POA: Diagnosis not present

## 2021-03-20 DIAGNOSIS — E440 Moderate protein-calorie malnutrition: Secondary | ICD-10-CM | POA: Diagnosis not present

## 2021-03-20 DIAGNOSIS — R2681 Unsteadiness on feet: Secondary | ICD-10-CM | POA: Diagnosis not present

## 2021-03-20 DIAGNOSIS — S22080D Wedge compression fracture of T11-T12 vertebra, subsequent encounter for fracture with routine healing: Secondary | ICD-10-CM | POA: Diagnosis not present

## 2021-03-20 DIAGNOSIS — D649 Anemia, unspecified: Secondary | ICD-10-CM | POA: Diagnosis not present

## 2021-03-20 DIAGNOSIS — R627 Adult failure to thrive: Secondary | ICD-10-CM | POA: Diagnosis not present

## 2021-03-27 DIAGNOSIS — E038 Other specified hypothyroidism: Secondary | ICD-10-CM | POA: Diagnosis not present

## 2021-03-27 DIAGNOSIS — D649 Anemia, unspecified: Secondary | ICD-10-CM | POA: Diagnosis not present

## 2021-03-27 DIAGNOSIS — E119 Type 2 diabetes mellitus without complications: Secondary | ICD-10-CM | POA: Diagnosis not present

## 2021-03-27 DIAGNOSIS — I10 Essential (primary) hypertension: Secondary | ICD-10-CM | POA: Diagnosis not present

## 2021-03-27 DIAGNOSIS — G459 Transient cerebral ischemic attack, unspecified: Secondary | ICD-10-CM | POA: Diagnosis not present

## 2021-03-27 DIAGNOSIS — E559 Vitamin D deficiency, unspecified: Secondary | ICD-10-CM | POA: Diagnosis not present

## 2021-03-27 DIAGNOSIS — C50919 Malignant neoplasm of unspecified site of unspecified female breast: Secondary | ICD-10-CM | POA: Diagnosis not present

## 2021-03-28 DIAGNOSIS — E440 Moderate protein-calorie malnutrition: Secondary | ICD-10-CM | POA: Diagnosis not present

## 2021-03-28 DIAGNOSIS — D649 Anemia, unspecified: Secondary | ICD-10-CM | POA: Diagnosis not present

## 2021-03-28 DIAGNOSIS — M5020 Other cervical disc displacement, unspecified cervical region: Secondary | ICD-10-CM | POA: Diagnosis not present

## 2021-03-28 DIAGNOSIS — F3112 Bipolar disorder, current episode manic without psychotic features, moderate: Secondary | ICD-10-CM | POA: Diagnosis not present

## 2021-03-28 DIAGNOSIS — S22080D Wedge compression fracture of T11-T12 vertebra, subsequent encounter for fracture with routine healing: Secondary | ICD-10-CM | POA: Diagnosis not present

## 2021-03-28 DIAGNOSIS — R2681 Unsteadiness on feet: Secondary | ICD-10-CM | POA: Diagnosis not present

## 2021-03-28 DIAGNOSIS — G4700 Insomnia, unspecified: Secondary | ICD-10-CM | POA: Diagnosis not present

## 2021-03-28 DIAGNOSIS — R627 Adult failure to thrive: Secondary | ICD-10-CM | POA: Diagnosis not present

## 2021-03-28 DIAGNOSIS — F321 Major depressive disorder, single episode, moderate: Secondary | ICD-10-CM | POA: Diagnosis not present

## 2021-04-06 DIAGNOSIS — R2681 Unsteadiness on feet: Secondary | ICD-10-CM | POA: Diagnosis not present

## 2021-04-06 DIAGNOSIS — R627 Adult failure to thrive: Secondary | ICD-10-CM | POA: Diagnosis not present

## 2021-04-06 DIAGNOSIS — M5020 Other cervical disc displacement, unspecified cervical region: Secondary | ICD-10-CM | POA: Diagnosis not present

## 2021-04-06 DIAGNOSIS — D649 Anemia, unspecified: Secondary | ICD-10-CM | POA: Diagnosis not present

## 2021-04-06 DIAGNOSIS — E440 Moderate protein-calorie malnutrition: Secondary | ICD-10-CM | POA: Diagnosis not present

## 2021-04-06 DIAGNOSIS — S22080D Wedge compression fracture of T11-T12 vertebra, subsequent encounter for fracture with routine healing: Secondary | ICD-10-CM | POA: Diagnosis not present

## 2021-04-06 DIAGNOSIS — F321 Major depressive disorder, single episode, moderate: Secondary | ICD-10-CM | POA: Diagnosis not present

## 2021-04-06 DIAGNOSIS — F3112 Bipolar disorder, current episode manic without psychotic features, moderate: Secondary | ICD-10-CM | POA: Diagnosis not present

## 2021-04-11 DIAGNOSIS — S22080D Wedge compression fracture of T11-T12 vertebra, subsequent encounter for fracture with routine healing: Secondary | ICD-10-CM | POA: Diagnosis not present

## 2021-04-11 DIAGNOSIS — M6281 Muscle weakness (generalized): Secondary | ICD-10-CM | POA: Diagnosis not present

## 2021-04-11 DIAGNOSIS — D649 Anemia, unspecified: Secondary | ICD-10-CM | POA: Diagnosis not present

## 2021-04-11 DIAGNOSIS — E440 Moderate protein-calorie malnutrition: Secondary | ICD-10-CM | POA: Diagnosis not present

## 2021-04-11 DIAGNOSIS — R296 Repeated falls: Secondary | ICD-10-CM | POA: Diagnosis not present

## 2021-04-11 DIAGNOSIS — R2681 Unsteadiness on feet: Secondary | ICD-10-CM | POA: Diagnosis not present

## 2021-04-11 DIAGNOSIS — E119 Type 2 diabetes mellitus without complications: Secondary | ICD-10-CM | POA: Diagnosis not present

## 2021-04-11 DIAGNOSIS — Z853 Personal history of malignant neoplasm of breast: Secondary | ICD-10-CM | POA: Diagnosis not present

## 2021-04-11 DIAGNOSIS — Z7984 Long term (current) use of oral hypoglycemic drugs: Secondary | ICD-10-CM | POA: Diagnosis not present

## 2021-04-11 DIAGNOSIS — Z794 Long term (current) use of insulin: Secondary | ICD-10-CM | POA: Diagnosis not present

## 2021-04-11 DIAGNOSIS — Z8673 Personal history of transient ischemic attack (TIA), and cerebral infarction without residual deficits: Secondary | ICD-10-CM | POA: Diagnosis not present

## 2021-04-11 DIAGNOSIS — M5020 Other cervical disc displacement, unspecified cervical region: Secondary | ICD-10-CM | POA: Diagnosis not present

## 2021-04-11 DIAGNOSIS — R627 Adult failure to thrive: Secondary | ICD-10-CM | POA: Diagnosis not present

## 2021-04-13 DIAGNOSIS — E440 Moderate protein-calorie malnutrition: Secondary | ICD-10-CM | POA: Diagnosis not present

## 2021-04-13 DIAGNOSIS — M5020 Other cervical disc displacement, unspecified cervical region: Secondary | ICD-10-CM | POA: Diagnosis not present

## 2021-04-13 DIAGNOSIS — D649 Anemia, unspecified: Secondary | ICD-10-CM | POA: Diagnosis not present

## 2021-04-13 DIAGNOSIS — R627 Adult failure to thrive: Secondary | ICD-10-CM | POA: Diagnosis not present

## 2021-04-13 DIAGNOSIS — R2681 Unsteadiness on feet: Secondary | ICD-10-CM | POA: Diagnosis not present

## 2021-04-13 DIAGNOSIS — S22080D Wedge compression fracture of T11-T12 vertebra, subsequent encounter for fracture with routine healing: Secondary | ICD-10-CM | POA: Diagnosis not present

## 2021-04-17 DIAGNOSIS — E440 Moderate protein-calorie malnutrition: Secondary | ICD-10-CM | POA: Diagnosis not present

## 2021-04-17 DIAGNOSIS — M5020 Other cervical disc displacement, unspecified cervical region: Secondary | ICD-10-CM | POA: Diagnosis not present

## 2021-04-17 DIAGNOSIS — D649 Anemia, unspecified: Secondary | ICD-10-CM | POA: Diagnosis not present

## 2021-04-17 DIAGNOSIS — R627 Adult failure to thrive: Secondary | ICD-10-CM | POA: Diagnosis not present

## 2021-04-17 DIAGNOSIS — S22080D Wedge compression fracture of T11-T12 vertebra, subsequent encounter for fracture with routine healing: Secondary | ICD-10-CM | POA: Diagnosis not present

## 2021-04-17 DIAGNOSIS — R2681 Unsteadiness on feet: Secondary | ICD-10-CM | POA: Diagnosis not present

## 2021-04-18 DIAGNOSIS — G459 Transient cerebral ischemic attack, unspecified: Secondary | ICD-10-CM | POA: Diagnosis not present

## 2021-04-18 DIAGNOSIS — E119 Type 2 diabetes mellitus without complications: Secondary | ICD-10-CM | POA: Diagnosis not present

## 2021-04-18 DIAGNOSIS — F32A Depression, unspecified: Secondary | ICD-10-CM | POA: Diagnosis not present

## 2021-04-18 DIAGNOSIS — I10 Essential (primary) hypertension: Secondary | ICD-10-CM | POA: Diagnosis not present

## 2021-04-18 DIAGNOSIS — D649 Anemia, unspecified: Secondary | ICD-10-CM | POA: Diagnosis not present

## 2021-04-18 DIAGNOSIS — E559 Vitamin D deficiency, unspecified: Secondary | ICD-10-CM | POA: Diagnosis not present

## 2021-04-19 DIAGNOSIS — R627 Adult failure to thrive: Secondary | ICD-10-CM | POA: Diagnosis not present

## 2021-04-19 DIAGNOSIS — D649 Anemia, unspecified: Secondary | ICD-10-CM | POA: Diagnosis not present

## 2021-04-19 DIAGNOSIS — M5020 Other cervical disc displacement, unspecified cervical region: Secondary | ICD-10-CM | POA: Diagnosis not present

## 2021-04-19 DIAGNOSIS — S22080D Wedge compression fracture of T11-T12 vertebra, subsequent encounter for fracture with routine healing: Secondary | ICD-10-CM | POA: Diagnosis not present

## 2021-04-19 DIAGNOSIS — E440 Moderate protein-calorie malnutrition: Secondary | ICD-10-CM | POA: Diagnosis not present

## 2021-04-19 DIAGNOSIS — R2681 Unsteadiness on feet: Secondary | ICD-10-CM | POA: Diagnosis not present

## 2021-04-21 DIAGNOSIS — E782 Mixed hyperlipidemia: Secondary | ICD-10-CM | POA: Diagnosis not present

## 2021-04-21 DIAGNOSIS — Z79899 Other long term (current) drug therapy: Secondary | ICD-10-CM | POA: Diagnosis not present

## 2021-04-21 DIAGNOSIS — D518 Other vitamin B12 deficiency anemias: Secondary | ICD-10-CM | POA: Diagnosis not present

## 2021-04-21 DIAGNOSIS — I10 Essential (primary) hypertension: Secondary | ICD-10-CM | POA: Diagnosis not present

## 2021-04-21 DIAGNOSIS — E038 Other specified hypothyroidism: Secondary | ICD-10-CM | POA: Diagnosis not present

## 2021-04-21 DIAGNOSIS — E119 Type 2 diabetes mellitus without complications: Secondary | ICD-10-CM | POA: Diagnosis not present

## 2021-04-21 DIAGNOSIS — E559 Vitamin D deficiency, unspecified: Secondary | ICD-10-CM | POA: Diagnosis not present

## 2021-04-22 DIAGNOSIS — I10 Essential (primary) hypertension: Secondary | ICD-10-CM | POA: Diagnosis not present

## 2021-04-22 DIAGNOSIS — E038 Other specified hypothyroidism: Secondary | ICD-10-CM | POA: Diagnosis not present

## 2021-04-22 DIAGNOSIS — G459 Transient cerebral ischemic attack, unspecified: Secondary | ICD-10-CM | POA: Diagnosis not present

## 2021-04-22 DIAGNOSIS — E039 Hypothyroidism, unspecified: Secondary | ICD-10-CM | POA: Diagnosis not present

## 2021-04-22 DIAGNOSIS — D649 Anemia, unspecified: Secondary | ICD-10-CM | POA: Diagnosis not present

## 2021-04-22 DIAGNOSIS — C50919 Malignant neoplasm of unspecified site of unspecified female breast: Secondary | ICD-10-CM | POA: Diagnosis not present

## 2021-04-22 DIAGNOSIS — E559 Vitamin D deficiency, unspecified: Secondary | ICD-10-CM | POA: Diagnosis not present

## 2021-04-22 DIAGNOSIS — E119 Type 2 diabetes mellitus without complications: Secondary | ICD-10-CM | POA: Diagnosis not present

## 2021-04-24 DIAGNOSIS — S22080D Wedge compression fracture of T11-T12 vertebra, subsequent encounter for fracture with routine healing: Secondary | ICD-10-CM | POA: Diagnosis not present

## 2021-04-24 DIAGNOSIS — R627 Adult failure to thrive: Secondary | ICD-10-CM | POA: Diagnosis not present

## 2021-04-24 DIAGNOSIS — Z79899 Other long term (current) drug therapy: Secondary | ICD-10-CM | POA: Diagnosis not present

## 2021-04-24 DIAGNOSIS — E119 Type 2 diabetes mellitus without complications: Secondary | ICD-10-CM | POA: Diagnosis not present

## 2021-04-24 DIAGNOSIS — M5020 Other cervical disc displacement, unspecified cervical region: Secondary | ICD-10-CM | POA: Diagnosis not present

## 2021-04-24 DIAGNOSIS — E559 Vitamin D deficiency, unspecified: Secondary | ICD-10-CM | POA: Diagnosis not present

## 2021-04-24 DIAGNOSIS — E039 Hypothyroidism, unspecified: Secondary | ICD-10-CM | POA: Diagnosis not present

## 2021-04-24 DIAGNOSIS — R2681 Unsteadiness on feet: Secondary | ICD-10-CM | POA: Diagnosis not present

## 2021-04-24 DIAGNOSIS — E440 Moderate protein-calorie malnutrition: Secondary | ICD-10-CM | POA: Diagnosis not present

## 2021-04-24 DIAGNOSIS — D649 Anemia, unspecified: Secondary | ICD-10-CM | POA: Diagnosis not present

## 2021-05-01 DIAGNOSIS — S22080D Wedge compression fracture of T11-T12 vertebra, subsequent encounter for fracture with routine healing: Secondary | ICD-10-CM | POA: Diagnosis not present

## 2021-05-01 DIAGNOSIS — E440 Moderate protein-calorie malnutrition: Secondary | ICD-10-CM | POA: Diagnosis not present

## 2021-05-01 DIAGNOSIS — D649 Anemia, unspecified: Secondary | ICD-10-CM | POA: Diagnosis not present

## 2021-05-01 DIAGNOSIS — R2681 Unsteadiness on feet: Secondary | ICD-10-CM | POA: Diagnosis not present

## 2021-05-01 DIAGNOSIS — M5020 Other cervical disc displacement, unspecified cervical region: Secondary | ICD-10-CM | POA: Diagnosis not present

## 2021-05-01 DIAGNOSIS — R627 Adult failure to thrive: Secondary | ICD-10-CM | POA: Diagnosis not present

## 2021-05-05 DIAGNOSIS — I10 Essential (primary) hypertension: Secondary | ICD-10-CM | POA: Diagnosis not present

## 2021-05-08 DIAGNOSIS — M5020 Other cervical disc displacement, unspecified cervical region: Secondary | ICD-10-CM | POA: Diagnosis not present

## 2021-05-08 DIAGNOSIS — D649 Anemia, unspecified: Secondary | ICD-10-CM | POA: Diagnosis not present

## 2021-05-08 DIAGNOSIS — E440 Moderate protein-calorie malnutrition: Secondary | ICD-10-CM | POA: Diagnosis not present

## 2021-05-08 DIAGNOSIS — S22080D Wedge compression fracture of T11-T12 vertebra, subsequent encounter for fracture with routine healing: Secondary | ICD-10-CM | POA: Diagnosis not present

## 2021-05-08 DIAGNOSIS — R2681 Unsteadiness on feet: Secondary | ICD-10-CM | POA: Diagnosis not present

## 2021-05-08 DIAGNOSIS — R627 Adult failure to thrive: Secondary | ICD-10-CM | POA: Diagnosis not present

## 2021-05-11 DIAGNOSIS — Z794 Long term (current) use of insulin: Secondary | ICD-10-CM | POA: Diagnosis not present

## 2021-05-11 DIAGNOSIS — R296 Repeated falls: Secondary | ICD-10-CM | POA: Diagnosis not present

## 2021-05-11 DIAGNOSIS — S22080D Wedge compression fracture of T11-T12 vertebra, subsequent encounter for fracture with routine healing: Secondary | ICD-10-CM | POA: Diagnosis not present

## 2021-05-11 DIAGNOSIS — Z7984 Long term (current) use of oral hypoglycemic drugs: Secondary | ICD-10-CM | POA: Diagnosis not present

## 2021-05-11 DIAGNOSIS — M5020 Other cervical disc displacement, unspecified cervical region: Secondary | ICD-10-CM | POA: Diagnosis not present

## 2021-05-11 DIAGNOSIS — R2681 Unsteadiness on feet: Secondary | ICD-10-CM | POA: Diagnosis not present

## 2021-05-11 DIAGNOSIS — Z8673 Personal history of transient ischemic attack (TIA), and cerebral infarction without residual deficits: Secondary | ICD-10-CM | POA: Diagnosis not present

## 2021-05-11 DIAGNOSIS — E119 Type 2 diabetes mellitus without complications: Secondary | ICD-10-CM | POA: Diagnosis not present

## 2021-05-11 DIAGNOSIS — R627 Adult failure to thrive: Secondary | ICD-10-CM | POA: Diagnosis not present

## 2021-05-11 DIAGNOSIS — E440 Moderate protein-calorie malnutrition: Secondary | ICD-10-CM | POA: Diagnosis not present

## 2021-05-11 DIAGNOSIS — Z853 Personal history of malignant neoplasm of breast: Secondary | ICD-10-CM | POA: Diagnosis not present

## 2021-05-11 DIAGNOSIS — D649 Anemia, unspecified: Secondary | ICD-10-CM | POA: Diagnosis not present

## 2021-05-11 DIAGNOSIS — M6281 Muscle weakness (generalized): Secondary | ICD-10-CM | POA: Diagnosis not present

## 2021-05-18 DIAGNOSIS — R2681 Unsteadiness on feet: Secondary | ICD-10-CM | POA: Diagnosis not present

## 2021-05-18 DIAGNOSIS — M5020 Other cervical disc displacement, unspecified cervical region: Secondary | ICD-10-CM | POA: Diagnosis not present

## 2021-05-18 DIAGNOSIS — E440 Moderate protein-calorie malnutrition: Secondary | ICD-10-CM | POA: Diagnosis not present

## 2021-05-18 DIAGNOSIS — R627 Adult failure to thrive: Secondary | ICD-10-CM | POA: Diagnosis not present

## 2021-05-18 DIAGNOSIS — S22080D Wedge compression fracture of T11-T12 vertebra, subsequent encounter for fracture with routine healing: Secondary | ICD-10-CM | POA: Diagnosis not present

## 2021-05-18 DIAGNOSIS — D649 Anemia, unspecified: Secondary | ICD-10-CM | POA: Diagnosis not present

## 2021-05-19 ENCOUNTER — Emergency Department (HOSPITAL_COMMUNITY): Payer: Medicare Other

## 2021-05-19 ENCOUNTER — Inpatient Hospital Stay (HOSPITAL_COMMUNITY)
Admission: EM | Admit: 2021-05-19 | Discharge: 2021-05-23 | DRG: 064 | Disposition: A | Discharge status: Hospice | Payer: Medicare Other | Attending: Internal Medicine | Admitting: Internal Medicine

## 2021-05-19 ENCOUNTER — Observation Stay (HOSPITAL_COMMUNITY): Payer: Medicare Other

## 2021-05-19 DIAGNOSIS — I672 Cerebral atherosclerosis: Secondary | ICD-10-CM | POA: Diagnosis not present

## 2021-05-19 DIAGNOSIS — I639 Cerebral infarction, unspecified: Secondary | ICD-10-CM | POA: Diagnosis not present

## 2021-05-19 DIAGNOSIS — Z170 Estrogen receptor positive status [ER+]: Secondary | ICD-10-CM | POA: Diagnosis not present

## 2021-05-19 DIAGNOSIS — J9601 Acute respiratory failure with hypoxia: Secondary | ICD-10-CM | POA: Diagnosis present

## 2021-05-19 DIAGNOSIS — N390 Urinary tract infection, site not specified: Secondary | ICD-10-CM | POA: Diagnosis present

## 2021-05-19 DIAGNOSIS — R1110 Vomiting, unspecified: Secondary | ICD-10-CM | POA: Diagnosis present

## 2021-05-19 DIAGNOSIS — Z823 Family history of stroke: Secondary | ICD-10-CM

## 2021-05-19 DIAGNOSIS — I10 Essential (primary) hypertension: Secondary | ICD-10-CM | POA: Diagnosis present

## 2021-05-19 DIAGNOSIS — U071 COVID-19: Secondary | ICD-10-CM | POA: Diagnosis present

## 2021-05-19 DIAGNOSIS — Z66 Do not resuscitate: Secondary | ICD-10-CM | POA: Diagnosis not present

## 2021-05-19 DIAGNOSIS — G928 Other toxic encephalopathy: Secondary | ICD-10-CM | POA: Diagnosis present

## 2021-05-19 DIAGNOSIS — R531 Weakness: Secondary | ICD-10-CM | POA: Diagnosis not present

## 2021-05-19 DIAGNOSIS — R29810 Facial weakness: Secondary | ICD-10-CM | POA: Diagnosis present

## 2021-05-19 DIAGNOSIS — E785 Hyperlipidemia, unspecified: Secondary | ICD-10-CM | POA: Diagnosis present

## 2021-05-19 DIAGNOSIS — F32A Depression, unspecified: Secondary | ICD-10-CM | POA: Diagnosis present

## 2021-05-19 DIAGNOSIS — Z794 Long term (current) use of insulin: Secondary | ICD-10-CM

## 2021-05-19 DIAGNOSIS — D61818 Other pancytopenia: Secondary | ICD-10-CM | POA: Diagnosis present

## 2021-05-19 DIAGNOSIS — R29702 NIHSS score 2: Secondary | ICD-10-CM | POA: Diagnosis present

## 2021-05-19 DIAGNOSIS — Z79811 Long term (current) use of aromatase inhibitors: Secondary | ICD-10-CM

## 2021-05-19 DIAGNOSIS — D649 Anemia, unspecified: Secondary | ICD-10-CM | POA: Diagnosis present

## 2021-05-19 DIAGNOSIS — R29818 Other symptoms and signs involving the nervous system: Secondary | ICD-10-CM | POA: Diagnosis not present

## 2021-05-19 DIAGNOSIS — I6782 Cerebral ischemia: Secondary | ICD-10-CM | POA: Diagnosis not present

## 2021-05-19 DIAGNOSIS — D709 Neutropenia, unspecified: Secondary | ICD-10-CM | POA: Diagnosis present

## 2021-05-19 DIAGNOSIS — I6389 Other cerebral infarction: Secondary | ICD-10-CM | POA: Diagnosis not present

## 2021-05-19 DIAGNOSIS — R110 Nausea: Secondary | ICD-10-CM | POA: Diagnosis not present

## 2021-05-19 DIAGNOSIS — D696 Thrombocytopenia, unspecified: Secondary | ICD-10-CM | POA: Diagnosis present

## 2021-05-19 DIAGNOSIS — Z7902 Long term (current) use of antithrombotics/antiplatelets: Secondary | ICD-10-CM

## 2021-05-19 DIAGNOSIS — I63532 Cerebral infarction due to unspecified occlusion or stenosis of left posterior cerebral artery: Secondary | ICD-10-CM | POA: Diagnosis not present

## 2021-05-19 DIAGNOSIS — B9620 Unspecified Escherichia coli [E. coli] as the cause of diseases classified elsewhere: Secondary | ICD-10-CM | POA: Diagnosis present

## 2021-05-19 DIAGNOSIS — Z7401 Bed confinement status: Secondary | ICD-10-CM

## 2021-05-19 DIAGNOSIS — F0390 Unspecified dementia without behavioral disturbance: Secondary | ICD-10-CM | POA: Diagnosis not present

## 2021-05-19 DIAGNOSIS — Z886 Allergy status to analgesic agent status: Secondary | ICD-10-CM

## 2021-05-19 DIAGNOSIS — Z515 Encounter for palliative care: Secondary | ICD-10-CM

## 2021-05-19 DIAGNOSIS — R112 Nausea with vomiting, unspecified: Secondary | ICD-10-CM | POA: Diagnosis not present

## 2021-05-19 DIAGNOSIS — Z8249 Family history of ischemic heart disease and other diseases of the circulatory system: Secondary | ICD-10-CM

## 2021-05-19 DIAGNOSIS — R0902 Hypoxemia: Secondary | ICD-10-CM | POA: Diagnosis not present

## 2021-05-19 DIAGNOSIS — R4182 Altered mental status, unspecified: Secondary | ICD-10-CM | POA: Diagnosis not present

## 2021-05-19 DIAGNOSIS — E1165 Type 2 diabetes mellitus with hyperglycemia: Secondary | ICD-10-CM | POA: Diagnosis present

## 2021-05-19 DIAGNOSIS — Z7189 Other specified counseling: Secondary | ICD-10-CM | POA: Diagnosis not present

## 2021-05-19 DIAGNOSIS — Z87891 Personal history of nicotine dependence: Secondary | ICD-10-CM

## 2021-05-19 DIAGNOSIS — Z79890 Hormone replacement therapy (postmenopausal): Secondary | ICD-10-CM

## 2021-05-19 DIAGNOSIS — G319 Degenerative disease of nervous system, unspecified: Secondary | ICD-10-CM | POA: Diagnosis not present

## 2021-05-19 DIAGNOSIS — G40209 Localization-related (focal) (partial) symptomatic epilepsy and epileptic syndromes with complex partial seizures, not intractable, without status epilepticus: Secondary | ICD-10-CM | POA: Diagnosis present

## 2021-05-19 DIAGNOSIS — R1111 Vomiting without nausea: Secondary | ICD-10-CM | POA: Diagnosis not present

## 2021-05-19 DIAGNOSIS — C50212 Malignant neoplasm of upper-inner quadrant of left female breast: Secondary | ICD-10-CM

## 2021-05-19 DIAGNOSIS — I447 Left bundle-branch block, unspecified: Secondary | ICD-10-CM | POA: Diagnosis present

## 2021-05-19 DIAGNOSIS — Z803 Family history of malignant neoplasm of breast: Secondary | ICD-10-CM

## 2021-05-19 DIAGNOSIS — G9389 Other specified disorders of brain: Secondary | ICD-10-CM | POA: Diagnosis not present

## 2021-05-19 DIAGNOSIS — Z8659 Personal history of other mental and behavioral disorders: Secondary | ICD-10-CM

## 2021-05-19 DIAGNOSIS — Z8673 Personal history of transient ischemic attack (TIA), and cerebral infarction without residual deficits: Secondary | ICD-10-CM

## 2021-05-19 DIAGNOSIS — R471 Dysarthria and anarthria: Secondary | ICD-10-CM | POA: Diagnosis present

## 2021-05-19 DIAGNOSIS — R4189 Other symptoms and signs involving cognitive functions and awareness: Secondary | ICD-10-CM | POA: Diagnosis present

## 2021-05-19 DIAGNOSIS — R944 Abnormal results of kidney function studies: Secondary | ICD-10-CM | POA: Diagnosis present

## 2021-05-19 DIAGNOSIS — Z79899 Other long term (current) drug therapy: Secondary | ICD-10-CM

## 2021-05-19 LAB — ETHANOL: Alcohol, Ethyl (B): <10 mg/dL (ref <10)

## 2021-05-19 LAB — DIFFERENTIAL
Abs Immature Granulocytes: 0.02 10*3/uL (ref 0.00–0.07)
Basophils Absolute: 0 10*3/uL (ref 0.0–0.1)
Basophils Relative: 1 %
Eosinophils Absolute: 0 10*3/uL (ref 0.0–0.5)
Eosinophils Relative: 0 %
Immature Granulocytes: 0 %
Lymphocytes Relative: 8 %
Lymphs Abs: 0.4 10*3/uL — ABNORMAL LOW (ref 0.7–4.0)
Monocytes Absolute: 0.6 10*3/uL (ref 0.1–1.0)
Monocytes Relative: 12 %
Neutro Abs: 4 10*3/uL (ref 1.7–7.7)
Neutrophils Relative %: 79 %

## 2021-05-19 LAB — I-STAT CHEM 8, ED
BUN: 17 mg/dL (ref 8–23)
Calcium, Ion: 1.16 mmol/L (ref 1.15–1.40)
Chloride: 101 mmol/L (ref 98–111)
Creatinine, Ser: 1 mg/dL (ref 0.44–1.00)
Glucose, Bld: 145 mg/dL — ABNORMAL HIGH (ref 70–99)
HCT: 34 % — ABNORMAL LOW (ref 36.0–46.0)
Hemoglobin: 11.6 g/dL — ABNORMAL LOW (ref 12.0–15.0)
Potassium: 4.2 mmol/L (ref 3.5–5.1)
Sodium: 138 mmol/L (ref 135–145)
TCO2: 28 mmol/L (ref 22–32)

## 2021-05-19 LAB — RESP PANEL BY RT-PCR (FLU A&B, COVID) ARPGX2
Influenza A by PCR: NEGATIVE
Influenza B by PCR: NEGATIVE
SARS Coronavirus 2 by RT PCR: POSITIVE — AB

## 2021-05-19 LAB — CBC
HCT: 34.7 % — ABNORMAL LOW (ref 36.0–46.0)
Hemoglobin: 11.4 g/dL — ABNORMAL LOW (ref 12.0–15.0)
MCH: 31 pg (ref 26.0–34.0)
MCHC: 32.9 g/dL (ref 30.0–36.0)
MCV: 94.3 fL (ref 80.0–100.0)
Platelets: 171 10*3/uL (ref 150–400)
RBC: 3.68 MIL/uL — ABNORMAL LOW (ref 3.87–5.11)
RDW: 12.7 % (ref 11.5–15.5)
WBC: 5 10*3/uL (ref 4.0–10.5)
nRBC: 0 % (ref 0.0–0.2)

## 2021-05-19 LAB — RAPID URINE DRUG SCREEN, HOSP PERFORMED
Amphetamines: NOT DETECTED
Barbiturates: NOT DETECTED
Benzodiazepines: NOT DETECTED
Cocaine: NOT DETECTED
Opiates: NOT DETECTED
Tetrahydrocannabinol: NOT DETECTED

## 2021-05-19 LAB — COMPREHENSIVE METABOLIC PANEL
ALT: 16 U/L (ref 0–44)
AST: 40 U/L (ref 15–41)
Albumin: 3.5 g/dL (ref 3.5–5.0)
Alkaline Phosphatase: 55 U/L (ref 38–126)
Anion gap: 11 (ref 5–15)
BUN: 16 mg/dL (ref 8–23)
CO2: 25 mmol/L (ref 22–32)
Calcium: 9.6 mg/dL (ref 8.9–10.3)
Chloride: 99 mmol/L (ref 98–111)
Creatinine, Ser: 1.11 mg/dL — ABNORMAL HIGH (ref 0.44–1.00)
GFR, Estimated: 52 mL/min — ABNORMAL LOW (ref >60)
Glucose, Bld: 139 mg/dL — ABNORMAL HIGH (ref 70–99)
Potassium: 4.1 mmol/L (ref 3.5–5.1)
Sodium: 135 mmol/L (ref 135–145)
Total Bilirubin: 0.7 mg/dL (ref 0.3–1.2)
Total Protein: 7.3 g/dL (ref 6.5–8.1)

## 2021-05-19 LAB — TROPONIN I (HIGH SENSITIVITY)
Troponin I (High Sensitivity): 5 ng/L (ref <18)
Troponin I (High Sensitivity): 8 ng/L (ref <18)

## 2021-05-19 LAB — AMMONIA: Ammonia: 23 umol/L (ref 9–35)

## 2021-05-19 LAB — VALPROIC ACID LEVEL: Valproic Acid Lvl: 37 ug/mL — ABNORMAL LOW (ref 50.0–100.0)

## 2021-05-19 LAB — APTT: aPTT: <20 seconds — ABNORMAL LOW (ref 24–36)

## 2021-05-19 LAB — PROTIME-INR
INR: 1 (ref 0.8–1.2)
Prothrombin Time: 13.6 seconds (ref 11.4–15.2)

## 2021-05-19 LAB — TSH: TSH: 1.354 u[IU]/mL (ref 0.350–4.500)

## 2021-05-19 LAB — VITAMIN B12: Vitamin B-12: 544 pg/mL (ref 180–914)

## 2021-05-19 LAB — LIPASE, BLOOD: Lipase: 29 U/L (ref 11–51)

## 2021-05-19 MED ORDER — ONDANSETRON HCL 4 MG/2ML IJ SOLN: 4.0000 mg | Freq: Four times a day (QID) | INTRAMUSCULAR | Status: DC | PRN

## 2021-05-19 MED ORDER — ACETAMINOPHEN 325 MG PO TABS
650.0000 mg | ORAL_TABLET | Freq: Four times a day (QID) | ORAL | Status: DC | PRN
  Administered 2021-05-20 (×2): 650 mg via ORAL
  Filled 2021-05-19 (×2): qty 2

## 2021-05-19 MED ORDER — LORAZEPAM 2 MG/ML IJ SOLN: 1.0000 mg | Freq: Once | INTRAMUSCULAR | Status: DC | PRN

## 2021-05-19 MED ORDER — ALBUTEROL SULFATE HFA 108 (90 BASE) MCG/ACT IN AERS
2.0000 | INHALATION_SPRAY | Freq: Four times a day (QID) | RESPIRATORY_TRACT | Status: DC
  Administered 2021-05-20 – 2021-05-21 (×3): 2 via RESPIRATORY_TRACT
  Filled 2021-05-19 (×2): qty 6.7

## 2021-05-19 MED ORDER — ASPIRIN 325 MG PO TABS: 325.0000 mg | ORAL_TABLET | Freq: Every day | ORAL | Status: DC

## 2021-05-19 MED ORDER — SODIUM CHLORIDE 0.9 % IV BOLUS
1000.0000 mL | Freq: Once | INTRAVENOUS | Status: AC
  Administered 2021-05-19: 1000 mL via INTRAVENOUS

## 2021-05-19 MED ORDER — ASCORBIC ACID 500 MG PO TABS
500.0000 mg | ORAL_TABLET | Freq: Every day | ORAL | Status: DC
  Administered 2021-05-20 – 2021-05-23 (×4): 500 mg via ORAL
  Filled 2021-05-19 (×4): qty 1

## 2021-05-19 MED ORDER — ZINC SULFATE 220 (50 ZN) MG PO CAPS
220.0000 mg | ORAL_CAPSULE | Freq: Every day | ORAL | Status: DC
  Administered 2021-05-20 – 2021-05-23 (×4): 220 mg via ORAL
  Filled 2021-05-19 (×4): qty 1

## 2021-05-19 MED ORDER — ONDANSETRON HCL 4 MG/2ML IJ SOLN
4.0000 mg | Freq: Once | INTRAMUSCULAR | Status: AC
  Administered 2021-05-19: 4 mg via INTRAVENOUS
  Filled 2021-05-19: qty 2

## 2021-05-19 MED ORDER — GUAIFENESIN-DM 100-10 MG/5ML PO SYRP: 10.0000 mL | ORAL_SOLUTION | ORAL | Status: DC | PRN

## 2021-05-19 MED ORDER — SODIUM CHLORIDE 0.9 % IV SOLN: INTRAVENOUS | Status: DC

## 2021-05-19 MED ORDER — ACETAMINOPHEN 650 MG RE SUPP: 650.0000 mg | Freq: Four times a day (QID) | RECTAL | Status: DC | PRN

## 2021-05-19 MED ORDER — STROKE: EARLY STAGES OF RECOVERY BOOK
Freq: Once | Status: DC
  Filled 2021-05-19: qty 1

## 2021-05-19 MED ORDER — SODIUM CHLORIDE 0.9 % IV SOLN
200.0000 mg | Freq: Once | INTRAVENOUS | Status: AC
  Administered 2021-05-20: 200 mg via INTRAVENOUS
  Filled 2021-05-19: qty 40

## 2021-05-19 MED ORDER — SODIUM CHLORIDE 0.9 % IV SOLN
100.0000 mg | Freq: Every day | INTRAVENOUS | Status: AC
  Administered 2021-05-20 – 2021-05-23 (×4): 100 mg via INTRAVENOUS
  Filled 2021-05-19 (×3): qty 20
  Filled 2021-05-19: qty 100
  Filled 2021-05-19 (×2): qty 20

## 2021-05-19 MED ORDER — ASPIRIN 300 MG RE SUPP: 300.0000 mg | Freq: Every day | RECTAL | Status: DC

## 2021-05-19 MED ORDER — ENOXAPARIN SODIUM 40 MG/0.4ML IJ SOSY
40.0000 mg | PREFILLED_SYRINGE | INTRAMUSCULAR | Status: DC
  Administered 2021-05-20 – 2021-05-23 (×5): 40 mg via SUBCUTANEOUS
  Filled 2021-05-19 (×5): qty 0.4

## 2021-05-19 MED ORDER — ONDANSETRON HCL 4 MG PO TABS: 4.0000 mg | ORAL_TABLET | Freq: Four times a day (QID) | ORAL | Status: DC | PRN

## 2021-05-19 NOTE — H&P (Signed)
History and Physical    Anna Hunt [id number] DOB: 1946/08/02 DOA: 05/19/2021  PCP: Lawerance Cruel, MD   Patient coming from: Home.   I have personally briefly reviewed patient's old medical records in [place]  Chief Complaint: Vomiting.   HPI: Anna Hunt is a 75 y.o. 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  The patient's daughter stated that she was able to talk and see her through video conference with the help of with the assisted living personnel and noticed that she was not her usual self.  EMS was called due to suspicion of acute CVA.  The patient is oriented to place, time and date with some difficulty, but does not remember clearly what happened since she thought that her symptoms began yesterday.  She is able to answer simple questions and denied headache, blurred vision, current dizziness, abdominal, back or chest pain.  No appetite or sleep changes reported.  No dysuria, frequency or hematuria.  No polyuria, polydipsia, polyphagia or blurred vision.  The patient's daughter stated that last week she has significant lower extremity pitting edema.  ED Course: Initial vital signs were temperature 98.2 F, pulse 98, respiration 16, BP 130/83 mmHg O2 sat 94% on room air.  The patient received a 1000 mL NS bolus and 4 mg of ondansetron IVP.  Lab work: CBC showed a white count of 5.0 with 79% neutrophils, 8% lymphocytes and 12% monocytes.  Hemoglobin 11.4 g/dL and platelets 171.  PT was 13.6, INR 1.0 and PTT less than 20.  Troponin and lipase were  normal.  CMP showed a glucose of 139 and creatinine of 1.11 mg/dL.  GFR was 52 mL/min.  Previously the patient has normal GFR and creatinine.  Imaging: Portable 1 view chest radiograph did not show any active disease.  CT head without contrast did not show any acute intracranial process.  There were stable chronic ischemic changes throughout the white matter and left occipital lobe.  MRI brain with without contrast showed possible punctate acute or subacute infarct in the left periventricular parietotemporal region.  There are remote left temporo-occipital and right cerebellar infarcts with moderate to outlines chronic microvascular ischemic change in disease.  Review of Systems: As per HPI otherwise all other systems reviewed and are negative.  Past Medical History:  Diagnosis Date   Breast cancer ([place])    Diabetes mellitus without complication ([place])    Displacement of cervical intervertebral disc without myelopathy    Family history of adverse reaction to anesthesia    per patient "younger sister was nauseated"   History of depression    Hypertension    LBBB (left bundle branch block)    Occipital stroke ([place]) - left    Unspecified cerebral artery occlusion with cerebral infarction    Vertigo    Past Surgical History:  Procedure Laterality Date   BACK SURGERY     BREAST LUMPECTOMY WITH RADIOACTIVE SEED LOCALIZATION Left 09/15/2020   Procedure: LEFT BREAST LUMPECTOMY WITH RADIOACTIVE SEED LOCALIZATION;  Surgeon: Rolm Bookbinder, MD;  Location: [facility];  Service: General;  Laterality: Left;   CATARACT EXTRACTION W/ INTRAOCULAR LENS  IMPLANT, BILATERAL  TUBAL LIGATION     Social History  reports that she has quit smoking. She has never used smokeless tobacco. She reports that she does not drink alcohol and does not use drugs.  Allergies  Allergen Reactions   Niaspan [Niacin Er] Hives and Swelling    Family History  Problem Relation Age of Onset   Heart attack Sister 39    Breast cancer Sister 29   Breast cancer Sister 95   Prior to Admission medications   Medication Sig Start Date End Date Taking? Authorizing Provider  acetaminophen (TYLENOL) 500 MG tablet Take 1,000 mg by mouth every 6 (six) hours as needed for mild pain.   Yes [provider]  anastrozole (ARIMIDEX) 1 MG tablet Take 1 tablet (1 mg total) by mouth daily. 08/24/20  Yes Causey, Charlestine Massed, NP  cholecalciferol (VITAMIN D3) 25 MCG (1000 UNIT) tablet Take 1,000 Units by mouth every morning.   Yes [provider]  clopidogrel (PLAVIX) 75 MG tablet Take 1 tablet (75 mg total) by mouth daily. 11/17/18  Yes Thurnell Lose, MD  divalproex (DEPAKOTE ER) 250 MG 24 hr tablet Take 250-500 mg by mouth See admin instructions. Take 1 tablet (250 mg) by mouth every morning, take 2 tablets (500 mg) at bedtime   Yes [provider]  furosemide (LASIX) 20 MG tablet Take 20 mg by mouth every morning.   Yes [provider]  insulin aspart (NOVOLOG FLEXPEN) 100 UNIT/ML FlexPen Inject 0-15 Units into the skin See admin instructions. Inject 0-15 units subcutaneously three times daily before meals and at bedtime per sliding scale: CBG 70-100 0 units, 151-250 2 units, 151 2-- 3 units, 201-250 5 units, 251-300 8 units, 301-350 11 units, 351-400 15 units, >400 call MD   Yes [provider]  levothyroxine (SYNTHROID) 25 MCG tablet Take 25 mcg by mouth every morning.   Yes [provider]  metFORMIN (GLUCOPHAGE-XR) 500 MG 24 hr tablet Take 500 mg by mouth at bedtime. 01/05/13  Yes [provider]  Multiple Vitamin (MULTIVITAMIN WITH MINERALS) TABS tablet Take 1 tablet by mouth every morning.   Yes [provider]  PARoxetine (PAXIL) 20 MG tablet Take 20 mg by mouth at bedtime.  01/05/13  Yes [provider]  simvastatin (ZOCOR) 40 MG tablet Take 40 mg by mouth at bedtime.  01/05/13  Yes [provider]  telmisartan (MICARDIS) 20 MG tablet  Take 20 mg by mouth every morning.   Yes [provider]  traMADol (ULTRAM) 50 MG tablet Take 1 tablet (50 mg total) by mouth every 6 (six) hours as needed. Patient taking differently: Take 50 mg by mouth every 8 (eight) hours as needed (pain). 12/29/20  Yes Drenda Freeze, MD  BD PEN NEEDLE NANO U/F 32G X 4 MM MISC  12/26/12   [provider]    Physical Exam: Vitals:   05/19/21 1726 05/19/21 1800 05/19/21 1926  BP:  130/82 122/67  Pulse:  98 (!) 114  Resp:  16 (!) 26  Temp:  98.2 F (36.8 C)   TempSrc:  Oral   SpO2: 94% 96% (!) 89%   Constitutional: NAD, calm, comfortable Eyes: PERRL, lids and conjunctivae normal ENMT: Mucous membranes are moist. Posterior pharynx clear of any exudate or lesions. Neck: Normal, supple, no masses, no thyromegaly Respiratory: Clear to auscultation bilaterally, no wheezing, no crackles. Normal respiratory effort. No accessory muscle use.  Cardiovascular: Tachycardic at 104 bpm, no murmurs / rubs / gallops. No  extremity edema. 2+ pedal pulses. No carotid bruits.  Abdomen: No distention.  Bowel sounds positive.  Soft, no tenderness, no masses palpated. No hepatosplenomegaly. Musculoskeletal: Moderate to severe generalized weakness.  More pronounced in lower extremities.  No clubbing / cyanosis. Good ROM, no contractures. Normal muscle tone.  Skin: no rashes, lesions, ulcers. No induration Neurologic: Mild left eyebrow droop, otherwise CN 2-12 grossly intact. Sensation intact, DTR normal. Strength mild to moderate nonfocal weakness on upper extremities and moderate to nonfocal severe weakness of the lower extremities. Psychiatric: Alert and oriented x 3 with some difficulty partially disoriented to situation. Normal mood.   Labs on Admission: I have personally reviewed following labs and imaging studies  CBC: Recent Labs  Lab 05/19/21 1906 05/19/21 1920  WBC 5.0  --   NEUTROABS 4.0  --   HGB 11.4* 11.6*  HCT 34.7* 34.0*  MCV  94.3  --   PLT 171  --    Basic Metabolic Panel: Recent Labs  Lab 05/19/21 1906 05/19/21 1920  NA 135 138  K 4.1 4.2  CL 99 101  CO2 25  --   GLUCOSE 139* 145*  BUN 16 17  CREATININE 1.11* 1.00  CALCIUM 9.6  --    GFR: CrCl cannot be calculated (Unknown ideal weight.).  Liver Function Tests: Recent Labs  Lab 05/19/21 1906  AST 40  ALT 16  ALKPHOS 55  BILITOT 0.7  PROT 7.3  ALBUMIN 3.5   Radiological Exams on Admission: CT HEAD WO CONTRAST  Result Date: 05/19/2021 CLINICAL DATA:  Neurologic deficit, nausea and vomiting EXAM: CT HEAD WITHOUT CONTRAST TECHNIQUE: Contiguous axial images were obtained from the base of the skull through the vertex without intravenous contrast. COMPARISON:  01/31/2021 FINDINGS: Brain: Stable encephalomalacia in the left occipital region compatible with prior cortical infarct. There are stable hypodensities throughout the periventricular white matter compatible with chronic small vessel ischemic changes. No signs of acute infarct or hemorrhage. The lateral ventricles and remaining midline structures are unremarkable. No acute extra-axial fluid collections. No mass effect. Vascular: Stable atherosclerosis.  No hyperdense vessel. Skull: Normal. Negative for fracture or focal lesion. Sinuses/Orbits: No acute finding. Other: None. IMPRESSION: 1. No acute intracranial process. 2. Stable chronic ischemic changes throughout the white matter and left occipital lobe. Electronically Signed   By: Randa Ngo M.D.   On: 05/19/2021 19:05   MR BRAIN WO CONTRAST  Result Date: 05/19/2021 CLINICAL DATA:  Transient ischemic attack (TIA) EXAM: MRI HEAD WITHOUT CONTRAST TECHNIQUE: Multiplanar, multiecho pulse sequences of the brain and surrounding structures were obtained without intravenous contrast. COMPARISON:  Same-day CT head.  MRI June 22 2020. FINDINGS: Brain: Punctate focus of DWI hyperintensity without definite ADC correlate in the left periventricular  parietotemporal region (series 5, image 72). Slight T2 hyperintensity in this region. Remote left temporooccipital infarct with encephalomalacia. Remote small right cerebellar lacunar infarcts. Moderate to advanced patchy and confluent T2 hyperintensity within the white matter, nonspecific but compatible with chronic microvascular ischemic disease. Moderate atrophy with ex vacuo ventricular dilation. No acute hemorrhage, hydrocephalus, mass lesion, midline shift or extra-axial fluid collection. Vascular: Major arterial flow voids are maintained at the skull base. Skull and upper cervical spine: Normal marrow signal. Sinuses/Orbits: Clear sinuses.  No acute orbital findings. Other: No sizable mastoid effusions. IMPRESSION: 1. Possible punctate acute or subacute infarct in the left periventricular parietotemporal region. 2. Remote left temporooccipital and right cerebellar infarcts with moderate to advanced chronic microvascular ischemic disease. Electronically Signed   By: Albertina Parr  Adah Salvage M.D.   On: 05/19/2021 20:17   DG Chest Port 1 View  Result Date: 05/19/2021 CLINICAL DATA:  Weakness EXAM: PORTABLE CHEST 1 VIEW COMPARISON:  06/01/2007 FINDINGS: The heart size and mediastinal contours are within normal limits when accounting for low lung volumes and technique. No focal pulmonary opacity. No pleural effusion or pneumothorax. No acute osseous abnormality. IMPRESSION: No active disease. Electronically Signed   By: Merilyn Baba M.D.   On: 05/19/2021 19:06    06/29/2020 echocardiogram IMPRESSIONS:   1. Left ventricular ejection fraction, by estimation, is 60 to 65%. The  left ventricle has normal function. The left ventricle has no regional  wall motion abnormalities. Left ventricular diastolic parameters are  consistent with Grade I diastolic  dysfunction (impaired relaxation).   2. Right ventricular systolic function is normal. The right ventricular  size is mildly enlarged. There is normal pulmonary  artery systolic  pressure. The estimated right ventricular systolic pressure is 0000000 mmHg.   3. The mitral valve is normal in structure. Trivial mitral valve  regurgitation. No evidence of mitral stenosis.   4. Tricuspid valve regurgitation is mild to moderate.   5. The aortic valve is tricuspid. There is moderate calcification of the  aortic valve. There is moderate thickening of the aortic valve. Aortic  valve regurgitation is not visualized. Mild to moderate aortic valve  sclerosis/calcification is present,  without any evidence of aortic stenosis.   6. The inferior vena cava is normal in size with greater than 50%  respiratory variability, suggesting right atrial pressure of 3 mmHg.   EKG: Independently reviewed.   Assessment/Plan Principal Problem:   Acute CVA (cerebrovascular accident) ([place]) Observation/telemetry. Frequent neurochecks. Swallow screen. Consult PT OT/SLP. Check hemoglobin A1c. Check fasting lipids. Check echocardiogram. Check carotid Doppler. Continue clopidogrel 75 mg p.o. daily. Continue simvastatin 40 mg p.o. daily. Neurology is following.  Active Problems:   COVID-19 virus infection Checks x-ray was clear. But had transient hypoxia earlier. Flutter valve and incentive spirometry. Continue remdesivir per pharmacy. Monitor pulse ox closely. Avoid glucocorticoids unless benefit greater than risk. Monitor CBC, CMP and inflammatory markers.    HTN (hypertension), benign Allow permissive hypertension.    Malignant neoplasm of upper-inner quadrant of left breast in female,    estrogen receptor positive (HCC) Continue on Arimidex 10 mg p.o. daily.    Type 2 diabetes mellitus with hyperglycemia (HCC) Carbohydrate modified diet. Continue metformin 500 mg p.o. at bedtime. CBG monitoring with R ISS.    Normocytic anemia Monitor hematocrit and hemoglobin. Transfuse if needed.    Decreased GFR Monitor renal function electrolytes.    History of  depression Continue paroxetine 20 mg p.o. daily.    Hyperlipidemia Continue simvastatin 40 mg p.o. daily.     DVT prophylaxis: Lovenox SQ. Code Status:   Full code. Family Communication:  Her daughter Caryl Asp was present in the ED room. Disposition Plan:   Patient is from:  Home.  Anticipated DC to:  Home.  Anticipated DC date:  05/21/2021.  Anticipated DC barriers: Clinical status/pending work-up/consultant sign off. Consults called:  The neuro hospitalist team is following. Admission status:  Observation/telemetry.   Severity of Illness: High severity after presenting with nausea, emesis and lightheadedness in the setting of acute CVA.  The patient will need to remain in the hospital for close monitoring, further evaluation and work-up.  Reubin Milan MD [facility]  How to contact the [facility] Attending or Consulting provider [place] or covering provider during after  hours 7P -7A, for this patient?   Check the care team in [facility] and look for a) attending/consulting [facility] provider listed and b) the [facility] team listed Log into [email or web address] and use [place]'s universal password to access. If you do not have the password, please contact the hospital operator. Locate the [facility] provider you are looking for under [facility] and page to a number that you can be directly reached. If you still have difficulty reaching the provider, please page the [facility] (Director on Call) for the Hospitalists listed on amion for assistance.  05/19/2021, 9:13 PM   This document was prepared using Dragon voice recognition software and may contain some unintended transcription errors.

## 2021-05-19 NOTE — ED Notes (Signed)
Patient transported to MRI 

## 2021-05-19 NOTE — ED Notes (Signed)
RN is aware of pts o2 levels

## 2021-05-19 NOTE — ED Notes (Signed)
Patient transported to CT 

## 2021-05-19 NOTE — Progress Notes (Signed)
EEG complete - results pending 

## 2021-05-19 NOTE — ED Triage Notes (Signed)
BIB [facility] after family called reporting "mini stroke" d/t patient throwing up after lunch. EMS reports their stroke screen negative and GCS of 15 throughout transport. No reports of nausea at this time. Pt has hx of TIA. HR: 102 BP 128/74 O2 94% RA R 18 CBG 147

## 2021-05-19 NOTE — ED Provider Notes (Signed)
[facility] EMERGENCY DEPARTMENT Provider Note   CSN: [id number] Arrival date & time: 05/19/21  1717     History No chief complaint on file.   Anna Hunt is a 75 y.o. female with pertinent PMH presenting to ED after family concerned that patient was having a "mini stroke". Pt ate lunch and 30 minutes later she had an episode of non-bloody emesis. Family was concerned that this could be a stroke, given she has a hx of strokes with associated vague symptoms. Pt reports feeling well, with no ongoing symptoms at the moment. She denies changes in vision, slurred speech, confusion, headaches, chest pain, SOB, N/V, diarrhea, constipation, abdominal pain, urinary urgency, or dysuria. Pt reports being at her baseline.     HPI     Past Medical History:  Diagnosis Date   Breast cancer ([place])    Diabetes mellitus without complication ([place])    Displacement of cervical intervertebral disc without myelopathy    Family history of adverse reaction to anesthesia    per patient "younger sister was nauseated"   History of depression    Hypertension    LBBB (left bundle branch block)    Occipital stroke ([place]) - left    Unspecified cerebral artery occlusion with cerebral infarction    Vertigo     Patient Active Problem List   Diagnosis Date Noted   Malnutrition of moderate degree 11/05/2020   Adult failure to thrive 11/02/2020   Type 2 diabetes mellitus with hyperglycemia ([place]) 11/02/2020   Normocytic anemia 11/02/2020   Acute UTI (urinary tract infection) 11/02/2020   Mild protein-calorie malnutrition ([place]) 11/02/2020   Failure to thrive in adult 11/02/2020   Urinary incontinence 10/11/2020   Cervical myelopathy ([place]) 06/24/2020   Genetic testing 06/23/2020   Cerebrovascular accident (CVA) ([place]) 06/23/2020   Gait abnormality 06/16/2020   Malignant neoplasm of upper-inner quadrant of left breast in female, estrogen receptor positive ([place]) 06/10/2020   TIA (transient  ischemic attack) 11/16/2018   HTN (hypertension), benign 11/16/2018   LBBB (left bundle branch block)    Diabetes mellitus without complication ([place])    Stroke ([place]) 01/22/2013   Displacement of cervical intervertebral disc without myelopathy     Past Surgical History:  Procedure Laterality Date   BACK SURGERY     BREAST LUMPECTOMY WITH RADIOACTIVE SEED LOCALIZATION Left 09/15/2020   Procedure: LEFT BREAST LUMPECTOMY WITH RADIOACTIVE SEED LOCALIZATION;  Surgeon: Rolm Bookbinder, MD;  Location: [facility];  Service: General;  Laterality: Left;   CATARACT EXTRACTION W/ INTRAOCULAR LENS  IMPLANT, BILATERAL     TUBAL LIGATION       OB History   No obstetric history on file.     Family History  Problem Relation Age of Onset   Heart attack Sister 5   Breast cancer Sister 10   Breast cancer Sister 51    Social History   Tobacco Use   Smoking status: Former   Smokeless tobacco: Never   Tobacco comments:    12/29/2021per patient quit about 20 years ago  Vaping Use   Vaping Use: Never used  Substance Use Topics   Alcohol use: No   Drug use: No    Home Medications Prior to Admission medications   Medication Sig Start Date End Date Taking? Authorizing Provider  acetaminophen (TYLENOL) 500 MG tablet Take 1,000 mg by mouth every 6 (six) hours as needed for mild pain.    [provider]  anastrozole (ARIMIDEX) 1 MG tablet Take 1  tablet (1 mg total) by mouth daily. 08/24/20   Gardenia Phlegm, NP  BD PEN NEEDLE NANO U/F 32G X 4 MM MISC  12/26/12   [provider]  Cholecalciferol (VITAMIN D) 2000 UNITS CAPS Take 2,000 Units by mouth every morning.     [provider]  clopidogrel (PLAVIX) 75 MG tablet Take 1 tablet (75 mg total) by mouth daily. 11/17/18   Thurnell Lose, MD  divalproex (DEPAKOTE ER) 500 MG 24 hr tablet Take 1 tablet (500 mg total) by mouth at bedtime. 06/23/20   Marcial Pacas, MD  metFORMIN (GLUCOPHAGE-XR) 500 MG 24 hr tablet Take  1,000 mg by mouth at bedtime. 01/05/13   [provider]  Multiple Vitamin (MULTIVITAMIN WITH MINERALS) TABS tablet Take 1 tablet by mouth daily.    [provider]  PARoxetine (PAXIL) 20 MG tablet Take 20 mg by mouth at bedtime.  01/05/13   [provider]  simvastatin (ZOCOR) 40 MG tablet Take 40 mg by mouth at bedtime.  01/05/13   [provider]  telmisartan (MICARDIS) 40 MG tablet Take 20 mg by mouth daily. 09/13/20   [provider]  traMADol (ULTRAM) 50 MG tablet Take 1 tablet (50 mg total) by mouth every 6 (six) hours as needed. 12/29/20   Drenda Freeze, MD    Allergies    Niaspan [niacin er]  Review of Systems   Review of Systems  All other systems reviewed and are negative.  Physical Exam Updated Vital Signs SpO2 94%   Physical Exam Constitutional:      General: She is not in acute distress.    Appearance: Normal appearance. She is not ill-appearing.  HENT:     Head: Normocephalic and atraumatic.  Eyes:     Extraocular Movements: Extraocular movements intact.     Conjunctiva/sclera: Conjunctivae normal.  Cardiovascular:     Rate and Rhythm: Normal rate and regular rhythm.     Pulses: Normal pulses.     Heart sounds: Normal heart sounds.  Pulmonary:     Effort: Pulmonary effort is normal.     Breath sounds: Normal breath sounds.  Abdominal:     General: Abdomen is flat.     Palpations: Abdomen is soft.  Musculoskeletal:     Cervical back: Normal range of motion and neck supple.  Skin:    General: Skin is warm and dry.  Neurological:     General: No focal deficit present.     Mental Status: She is alert and oriented to person, place, and time. Mental status is at baseline.     Sensory: No sensory deficit.     Motor: No weakness.  Psychiatric:        Mood and Affect: Mood normal.        Behavior: Behavior normal.        Thought Content: Thought content normal.    ED Results / Procedures / Treatments   Labs (all  labs ordered are listed, but only abnormal results are displayed) Labs Reviewed  ETHANOL  PROTIME-INR  APTT  CBC  DIFFERENTIAL  COMPREHENSIVE METABOLIC PANEL  RAPID URINE DRUG SCREEN, HOSP PERFORMED  URINALYSIS, ROUTINE W REFLEX MICROSCOPIC  LIPASE, BLOOD  I-STAT CHEM 8, ED  TROPONIN I (HIGH SENSITIVITY)    EKG None  Radiology No results found.  Procedures Procedures   Medications Ordered in ED Medications  ondansetron (ZOFRAN) injection 4 mg (has no administration in time range)  sodium chloride 0.9 % bolus 1,000 mL (  has no administration in time range)    ED Course  I have reviewed the triage vital signs and the nursing notes.  Pertinent labs & imaging results that were available during my care of the patient were reviewed by me and considered in my medical decision making (see chart for details).    MDM Rules/Calculators/A&P                           Pt with PMH of TIAs presenting for non-bloody emesis shortly after eating her lunch. She has no other associated symptoms, and remains asymptomatic in the ED. Family was concerned for a stroke given vague presentation in elderly lady with hx of TIA. She remains at her baseline and stable vitals. Troponins, CMP, and CBC not concerning. U/A pending.   CT negative for acute changes, but MRI showing possible punctate acute or subacute infarct in the left periventricular parietotemporal region. Pt remains stable and at her baseline, with no ongoing nausea or vomiting. No focal neurological deficits. Neurology consulted, and pt will be admitted as inpatient for further workup.     Final Clinical Impression(s) / ED Diagnoses Final diagnoses:  None    Rx / DC Orders ED Discharge Orders     None        Lajean Manes, MD 05/19/21 2104    Drenda Freeze, MD 05/19/21 [phone]

## 2021-05-19 NOTE — Consult Note (Addendum)
Neurology Consultation  Reason for Consult:  vomiting, stroke on MRI Referring Physician: Dr. Darl Householder  CC: Vomiting, stroke on MRI  History is obtained from: Chart review, patient  HPI: Anna Hunt is a 75 y.o. 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  Family also noted that she was somewhat confused and her speech was mildly slower than baseline.  Her last known well was sometime this morning-unclear exact timing. She has a history of stroke and TIA.  The family was concerned that she was having another strokelike episode given these new symptoms of sudden onset. She was evaluated in the emergency room, CBC and BMP were not very impressive for any acute abnormality. She was sent in for an MRI of the brain because there was also some concern for confusion. MRI showed a tiny area of possible subacute stroke in the left periventricular area.  Also demonstrated was a large chronic left PCA territory infarction. Currently denies any nausea or vomiting but is holding emesis bag in her hand. Denies any headache or chest pain.  Denies shortness of breath.  Denies tingling numbness or weakness.   Daughter came in later-patient has been moved to an assisted living facility because of problems with mobility and essentially being bedridden.  She also has had progressive worsening of her memory.  Last visit last year with Dr. Mary Sella in [facility] neurology-[facility] was 27/30.  She was on Coumadin for a while since her last stroke when she was 75 years old but then because of no evidence of atrial fibrillation, she was taken off of it and was on aspirin monotherapy for a while before transitioning it to Plavix monotherapy.  LKW: Unclear-sometime this morning tpa given?: no,  outside the window Premorbid modified Rankin scale (mRS): Essentially bedridden-5   ROS: Full ROS was performed and is negative except as noted in the HPI.  Past Medical History:  Diagnosis Date   Breast cancer ([place])    Diabetes mellitus without complication ([place])    Displacement of cervical intervertebral disc without myelopathy    Family history of adverse reaction to anesthesia    per patient "younger sister was nauseated"   History of depression    Hypertension    LBBB (left bundle branch block)    Occipital stroke ([place]) - left    Unspecified cerebral artery occlusion with cerebral infarction    Vertigo    Family History  Problem Relation Age of Onset   Heart attack Sister 49   Breast cancer Sister 69   Breast cancer Sister 66     Social History:   reports that she has quit smoking. She has never used smokeless tobacco. She reports that she does not drink alcohol and does not use drugs.  Medications  Current Facility-Administered Medications:    LORazepam (ATIVAN) injection 1 mg, 1 mg, Intravenous, Once PRN, Drenda Freeze, MD   sodium chloride 0.9 % bolus 1,000 mL, 1,000 mL, Intravenous, Once, Drenda Freeze, MD  Current Outpatient Medications:    acetaminophen (TYLENOL) 500 MG tablet, Take 1,000 mg by mouth every 6 (six) hours as needed for mild pain., Disp: , Rfl:    anastrozole (ARIMIDEX) 1 MG tablet, Take 1 tablet (1 mg total) by mouth daily., Disp: 90 tablet, Rfl: 4   cholecalciferol (VITAMIN  D3) 25 MCG (1000 UNIT) tablet, Take 1,000 Units by mouth every morning., Disp: , Rfl:    clopidogrel (PLAVIX) 75 MG tablet, Take 1 tablet (75 mg total) by mouth daily., Disp: 30 tablet, Rfl: 0   divalproex (DEPAKOTE ER) 250 MG 24 hr tablet, Take 250-500 mg by mouth See admin instructions. Take 1 tablet (250 mg) by mouth every morning, take 2 tablets (500 mg) at bedtime, Disp: , Rfl:    furosemide (LASIX) 20 MG tablet, Take 20 mg by mouth every morning., Disp: , Rfl:     insulin aspart (NOVOLOG FLEXPEN) 100 UNIT/ML FlexPen, Inject 0-15 Units into the skin See admin instructions. Inject 0-15 units subcutaneously three times daily before meals and at bedtime per sliding scale: CBG 70-100 0 units, 151-250 2 units, 151 2-- 3 units, 201-250 5 units, 251-300 8 units, 301-350 11 units, 351-400 15 units, >400 call MD, Disp: , Rfl:    levothyroxine (SYNTHROID) 25 MCG tablet, Take 25 mcg by mouth every morning., Disp: , Rfl:    metFORMIN (GLUCOPHAGE-XR) 500 MG 24 hr tablet, Take 500 mg by mouth at bedtime., Disp: , Rfl:    Multiple Vitamin (MULTIVITAMIN WITH MINERALS) TABS tablet, Take 1 tablet by mouth every morning., Disp: , Rfl:    PARoxetine (PAXIL) 20 MG tablet, Take 20 mg by mouth at bedtime. , Disp: , Rfl:    simvastatin (ZOCOR) 40 MG tablet, Take 40 mg by mouth at bedtime. , Disp: , Rfl:    telmisartan (MICARDIS) 20 MG tablet, Take 20 mg by mouth every morning., Disp: , Rfl:    traMADol (ULTRAM) 50 MG tablet, Take 1 tablet (50 mg total) by mouth every 6 (six) hours as needed. (Patient taking differently: Take 50 mg by mouth every 8 (eight) hours as needed (pain).), Disp: 10 tablet, Rfl: 0   BD PEN NEEDLE NANO U/F 32G X 4 MM MISC, , Disp: , Rfl:    Exam: Current vital signs: BP 122/67   Pulse (!) 114   Temp 98.2 F (36.8 C) (Oral)   Resp (!) 26   SpO2 (!) 89%  Vital signs in last 24 hours: Temp:  [98.2 F (36.8 C)] 98.2 F (36.8 C) (09/09 1800) Pulse Rate:  [98-114] 114 (09/09 1926) Resp:  [16-26] 26 (09/09 1926) BP: (122-130)/(67-82) 122/67 (09/09 1926) SpO2:  [89 %-96 %] 89 % (09/09 1926)  GENERAL: Awake, alert in NAD HEENT: - Normocephalic and atraumatic, dry mm, no LN++, no Thyromegally LUNGS - Clear to auscultation bilaterally with no wheezes CV - S1S2 RRR, no m/r/g, equal pulses bilaterally. ABDOMEN - Soft, nontender, nondistended with normoactive BS Ext: warm, well perfused, intact peripheral pulses, trace  edema Neurological exam Awake  alert oriented x3 Speech is mildly dysarthric No evidence of aphasia Diminished attention concentration Cranial nerves: Pupils equal round react light, extraocular movements intact, visual field examination is somewhat difficult because of her attention concentration-possible right lower quadrantanopsia versus hemianopsia.  Face symmetric.  Tongue and palate midline. Motor exam with antigravity in all fours-4+/5 lower extremities, 5/5 upper extremities. Sensory exam intact to touch without extinction Coordination: Mild action tremor bilaterally, no dysmetria. NIH stroke scale 1a Level of Conscious.: 0 1b LOC Questions: 0 1c LOC Commands: 0 2 Best Gaze: 0 3 Visual: 1 4 Facial Palsy: 0 5a Motor Arm - left: 0 5b Motor Arm - Right: 0 6a Motor Leg - Left: 0 6b Motor Leg - Right: 0 7 Limb Ataxia: 0 8 Sensory: 0 9 Best Language: 0  10 Dysarthria: 1 11 Extinct. and Inatten.: 0 TOTAL: 2 Labs I have reviewed labs in epic and the results pertinent to this consultation are:   CBC    Component Value Date/Time   WBC 5.0 05/19/2021 1906   RBC 3.68 (L) 05/19/2021 1906   HGB 11.6 (L) 05/19/2021 1920   HGB 12.3 06/15/2020 1223   HCT 34.0 (L) 05/19/2021 1920   PLT 171 05/19/2021 1906   PLT 271 06/15/2020 1223   MCV 94.3 05/19/2021 1906   MCH 31.0 05/19/2021 1906   MCHC 32.9 05/19/2021 1906   RDW 12.7 05/19/2021 1906   LYMPHSABS 0.4 (L) 05/19/2021 1906   MONOABS 0.6 05/19/2021 1906   EOSABS 0.0 05/19/2021 1906   BASOSABS 0.0 05/19/2021 1906    CMP     Component Value Date/Time   NA 138 05/19/2021 1920   K 4.2 05/19/2021 1920   CL 101 05/19/2021 1920   CO2 25 05/19/2021 1906   GLUCOSE 145 (H) 05/19/2021 1920   BUN 17 05/19/2021 1920   CREATININE 1.00 05/19/2021 1920   CREATININE 0.93 06/15/2020 1223   CALCIUM 9.6 05/19/2021 1906   PROT 7.3 05/19/2021 1906   ALBUMIN 3.5 05/19/2021 1906   AST 40 05/19/2021 1906   AST 24 06/15/2020 1223   ALT 16 05/19/2021 1906   ALT 20  06/15/2020 1223   ALKPHOS 55 05/19/2021 1906   BILITOT 0.7 05/19/2021 1906   BILITOT 0.8 06/15/2020 1223   GFRNONAA 52 (L) 05/19/2021 1906   GFRNONAA >60 06/15/2020 1223   GFRAA >60 11/16/2018 1256  Imaging I have reviewed the images obtained:  CT-head-no acute intracranial process.  Stable chronic ischemic changes throughout the white matter and stable encephalomalacia in the left occipital lobe. MRI brain redemonstrates chronic white matter changes but has an acute area of punctate restricted diffusion in the left periventricular area.  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 MRI of the brain reveals stable large left occipital stroke with new punctate area of restricted diffusion for which neurological consultation was obtained. Radiologically this is a new subacute punctate infarct but I do not think that this is the true reason for her nausea vomiting and confusion. Toxic metabolic work-up should be performed in addition to stroke risk factor work-up.  Impression: Toxic metabolic encephalopathy more likely Subacute ischemic stroke likely incidental Less likely to be seizures but will obtain an EEG  Recommendations: Admit to hospitalist  Stroke risk factor work-up: Telemetry Frequent neurochecks Continue home Plavix for now Permissive hypertension-only treat if systolic is greater than [id number] on a as needed basis for the next 24 to 48 hours. CTA head and neck Echocardiogram A1c Lipid panel PT OT Speech therapy May need outpatient 30-day cardiac monitoring on discharge-we will defer to the stroke team  Encephalopathy work-up Check B12, TSH Also check urinalysis Check EEG Check Depakote level and ammonia level Delirium precautions  Stroke team will follow.  Plan discussed  with Dr. Darl Householder over the phone  -- Amie Portland, MD Neurologist [facility] Pager: [phone]

## 2021-05-20 ENCOUNTER — Observation Stay (HOSPITAL_COMMUNITY): Payer: Medicare Other

## 2021-05-20 ENCOUNTER — Inpatient Hospital Stay (HOSPITAL_COMMUNITY): Payer: Medicare Other

## 2021-05-20 DIAGNOSIS — I672 Cerebral atherosclerosis: Secondary | ICD-10-CM | POA: Diagnosis not present

## 2021-05-20 DIAGNOSIS — I10 Essential (primary) hypertension: Secondary | ICD-10-CM | POA: Diagnosis present

## 2021-05-20 DIAGNOSIS — R29818 Other symptoms and signs involving the nervous system: Secondary | ICD-10-CM | POA: Diagnosis not present

## 2021-05-20 DIAGNOSIS — U071 COVID-19: Secondary | ICD-10-CM

## 2021-05-20 DIAGNOSIS — G40209 Localization-related (focal) (partial) symptomatic epilepsy and epileptic syndromes with complex partial seizures, not intractable, without status epilepticus: Secondary | ICD-10-CM | POA: Diagnosis present

## 2021-05-20 DIAGNOSIS — R29810 Facial weakness: Secondary | ICD-10-CM | POA: Diagnosis present

## 2021-05-20 DIAGNOSIS — I63532 Cerebral infarction due to unspecified occlusion or stenosis of left posterior cerebral artery: Secondary | ICD-10-CM | POA: Diagnosis not present

## 2021-05-20 DIAGNOSIS — Z886 Allergy status to analgesic agent status: Secondary | ICD-10-CM | POA: Diagnosis not present

## 2021-05-20 DIAGNOSIS — Z170 Estrogen receptor positive status [ER+]: Secondary | ICD-10-CM | POA: Diagnosis not present

## 2021-05-20 DIAGNOSIS — I639 Cerebral infarction, unspecified: Secondary | ICD-10-CM | POA: Diagnosis present

## 2021-05-20 DIAGNOSIS — I6389 Other cerebral infarction: Secondary | ICD-10-CM

## 2021-05-20 DIAGNOSIS — F0390 Unspecified dementia without behavioral disturbance: Secondary | ICD-10-CM | POA: Diagnosis present

## 2021-05-20 DIAGNOSIS — D709 Neutropenia, unspecified: Secondary | ICD-10-CM | POA: Diagnosis present

## 2021-05-20 DIAGNOSIS — Z66 Do not resuscitate: Secondary | ICD-10-CM | POA: Diagnosis not present

## 2021-05-20 DIAGNOSIS — Z8673 Personal history of transient ischemic attack (TIA), and cerebral infarction without residual deficits: Secondary | ICD-10-CM | POA: Diagnosis not present

## 2021-05-20 DIAGNOSIS — Z7189 Other specified counseling: Secondary | ICD-10-CM | POA: Diagnosis not present

## 2021-05-20 DIAGNOSIS — D696 Thrombocytopenia, unspecified: Secondary | ICD-10-CM | POA: Diagnosis present

## 2021-05-20 DIAGNOSIS — C50212 Malignant neoplasm of upper-inner quadrant of left female breast: Secondary | ICD-10-CM | POA: Diagnosis present

## 2021-05-20 DIAGNOSIS — J9601 Acute respiratory failure with hypoxia: Secondary | ICD-10-CM | POA: Diagnosis present

## 2021-05-20 DIAGNOSIS — E785 Hyperlipidemia, unspecified: Secondary | ICD-10-CM | POA: Diagnosis present

## 2021-05-20 DIAGNOSIS — D61818 Other pancytopenia: Secondary | ICD-10-CM | POA: Diagnosis present

## 2021-05-20 DIAGNOSIS — R4182 Altered mental status, unspecified: Secondary | ICD-10-CM | POA: Diagnosis not present

## 2021-05-20 DIAGNOSIS — Z515 Encounter for palliative care: Secondary | ICD-10-CM | POA: Diagnosis not present

## 2021-05-20 DIAGNOSIS — R471 Dysarthria and anarthria: Secondary | ICD-10-CM | POA: Diagnosis present

## 2021-05-20 DIAGNOSIS — E1165 Type 2 diabetes mellitus with hyperglycemia: Secondary | ICD-10-CM | POA: Diagnosis present

## 2021-05-20 DIAGNOSIS — D649 Anemia, unspecified: Secondary | ICD-10-CM | POA: Diagnosis present

## 2021-05-20 DIAGNOSIS — R1110 Vomiting, unspecified: Secondary | ICD-10-CM | POA: Diagnosis present

## 2021-05-20 DIAGNOSIS — G928 Other toxic encephalopathy: Secondary | ICD-10-CM | POA: Diagnosis present

## 2021-05-20 DIAGNOSIS — F32A Depression, unspecified: Secondary | ICD-10-CM | POA: Diagnosis present

## 2021-05-20 DIAGNOSIS — N390 Urinary tract infection, site not specified: Secondary | ICD-10-CM | POA: Diagnosis present

## 2021-05-20 LAB — COMPREHENSIVE METABOLIC PANEL
ALT: 15 U/L (ref 0–44)
AST: 38 U/L (ref 15–41)
Albumin: 2.9 g/dL — ABNORMAL LOW (ref 3.5–5.0)
Alkaline Phosphatase: 44 U/L (ref 38–126)
Anion gap: 9 (ref 5–15)
BUN: 15 mg/dL (ref 8–23)
CO2: 23 mmol/L (ref 22–32)
Calcium: 8.9 mg/dL (ref 8.9–10.3)
Chloride: 107 mmol/L (ref 98–111)
Creatinine, Ser: 1.07 mg/dL — ABNORMAL HIGH (ref 0.44–1.00)
GFR, Estimated: 54 mL/min — ABNORMAL LOW (ref >60)
Glucose, Bld: 92 mg/dL (ref 70–99)
Potassium: 4.1 mmol/L (ref 3.5–5.1)
Sodium: 139 mmol/L (ref 135–145)
Total Bilirubin: 0.7 mg/dL (ref 0.3–1.2)
Total Protein: 6.4 g/dL — ABNORMAL LOW (ref 6.5–8.1)

## 2021-05-20 LAB — D-DIMER, QUANTITATIVE
D-Dimer, Quant: 0.29 ug/mL-FEU (ref 0.00–0.50)
D-Dimer, Quant: <0.27 ug/mL-FEU (ref 0.00–0.50)

## 2021-05-20 LAB — CBC
HCT: 31 % — ABNORMAL LOW (ref 36.0–46.0)
Hemoglobin: 10.3 g/dL — ABNORMAL LOW (ref 12.0–15.0)
MCH: 31.9 pg (ref 26.0–34.0)
MCHC: 33.2 g/dL (ref 30.0–36.0)
MCV: 96 fL (ref 80.0–100.0)
Platelets: 115 10*3/uL — ABNORMAL LOW (ref 150–400)
RBC: 3.23 MIL/uL — ABNORMAL LOW (ref 3.87–5.11)
RDW: 12.7 % (ref 11.5–15.5)
WBC: 2.9 10*3/uL — ABNORMAL LOW (ref 4.0–10.5)
nRBC: 0 % (ref 0.0–0.2)

## 2021-05-20 LAB — MAGNESIUM: Magnesium: 1.7 mg/dL (ref 1.7–2.4)

## 2021-05-20 LAB — CBG MONITORING, ED
Glucose-Capillary: 101 mg/dL — ABNORMAL HIGH (ref 70–99)
Glucose-Capillary: 120 mg/dL — ABNORMAL HIGH (ref 70–99)

## 2021-05-20 LAB — LIPID PANEL
Cholesterol: 124 mg/dL (ref 0–200)
HDL: 35 mg/dL — ABNORMAL LOW (ref >40)
LDL Cholesterol: 74 mg/dL (ref 0–99)
Total CHOL/HDL Ratio: 3.5 RATIO
Triglycerides: 75 mg/dL (ref <150)
VLDL: 15 mg/dL (ref 0–40)

## 2021-05-20 LAB — URINALYSIS, ROUTINE W REFLEX MICROSCOPIC
Bilirubin Urine: NEGATIVE
Glucose, UA: NEGATIVE mg/dL
Ketones, ur: NEGATIVE mg/dL
Nitrite: POSITIVE — AB
Protein, ur: NEGATIVE mg/dL
Specific Gravity, Urine: 1.025 (ref 1.005–1.030)
pH: 6 (ref 5.0–8.0)

## 2021-05-20 LAB — C-REACTIVE PROTEIN
CRP: <0.5 mg/dL (ref <1.0)
CRP: 0.5 mg/dL (ref <1.0)

## 2021-05-20 LAB — GLUCOSE, CAPILLARY
Glucose-Capillary: 160 mg/dL — ABNORMAL HIGH (ref 70–99)
Glucose-Capillary: 140 mg/dL — ABNORMAL HIGH (ref 70–99)

## 2021-05-20 LAB — LACTIC ACID, PLASMA: Lactic Acid, Venous: 1 mmol/L (ref 0.5–1.9)

## 2021-05-20 LAB — ECHOCARDIOGRAM COMPLETE
AR max vel: 3.78 cm2
AV Area VTI: 3.4 cm2
AV Area mean vel: 3.64 cm2
AV Mean grad: 9 mmHg
AV Peak grad: 16.6 mmHg
Ao pk vel: 2.04 m/s
Area-P 1/2: 3.55 cm2
MV M vel: 5.33 m/s
MV Peak grad: 113.6 mmHg
S' Lateral: 2.5 cm
Single Plane A4C EF: 57.4 %

## 2021-05-20 LAB — URINALYSIS, MICROSCOPIC (REFLEX)
Squamous Epithelial / HPF: NONE SEEN (ref 0–5)
WBC, UA: >50 WBC/hpf (ref 0–5)

## 2021-05-20 LAB — PHOSPHORUS: Phosphorus: 3.8 mg/dL (ref 2.5–4.6)

## 2021-05-20 LAB — HEMOGLOBIN A1C
Hgb A1c MFr Bld: 5.3 % (ref 4.8–5.6)
Mean Plasma Glucose: 105.41 mg/dL

## 2021-05-20 LAB — PROCALCITONIN: Procalcitonin: <0.1 ng/mL

## 2021-05-20 LAB — FERRITIN: Ferritin: 126 ng/mL (ref 11–307)

## 2021-05-20 MED ORDER — DEXAMETHASONE 4 MG PO TABS
6.0000 mg | ORAL_TABLET | ORAL | Status: DC
  Administered 2021-05-20 – 2021-05-23 (×4): 6 mg via ORAL
  Filled 2021-05-20: qty 2
  Filled 2021-05-20 (×3): qty 1

## 2021-05-20 MED ORDER — METFORMIN HCL ER 500 MG PO TB24: 500.0000 mg | ORAL_TABLET | Freq: Every day | ORAL | Status: DC

## 2021-05-20 MED ORDER — DIVALPROEX SODIUM ER 250 MG PO TB24
250.0000 mg | ORAL_TABLET | Freq: Every day | ORAL | Status: DC
  Administered 2021-05-20: 250 mg via ORAL
  Filled 2021-05-20 (×2): qty 1

## 2021-05-20 MED ORDER — DIVALPROEX SODIUM ER 250 MG PO TB24: 250.0000 mg | ORAL_TABLET | ORAL | Status: DC

## 2021-05-20 MED ORDER — DIVALPROEX SODIUM ER 500 MG PO TB24
500.0000 mg | ORAL_TABLET | Freq: Every day | ORAL | Status: DC
  Administered 2021-05-20: 500 mg via ORAL
  Filled 2021-05-20 (×2): qty 1

## 2021-05-20 MED ORDER — SODIUM CHLORIDE 0.9 % IV SOLN
1.0000 g | INTRAVENOUS | Status: DC
  Administered 2021-05-20 – 2021-05-23 (×4): 1 g via INTRAVENOUS
  Filled 2021-05-20 (×4): qty 10

## 2021-05-20 MED ORDER — PAROXETINE HCL 20 MG PO TABS
20.0000 mg | ORAL_TABLET | Freq: Every day | ORAL | Status: DC
  Administered 2021-05-20 – 2021-05-23 (×4): 20 mg via ORAL
  Filled 2021-05-20 (×5): qty 1

## 2021-05-20 MED ORDER — LEVOTHYROXINE SODIUM 25 MCG PO TABS
25.0000 ug | ORAL_TABLET | Freq: Every morning | ORAL | Status: DC
  Administered 2021-05-20 – 2021-05-23 (×4): 25 ug via ORAL
  Filled 2021-05-20 (×4): qty 1

## 2021-05-20 MED ORDER — SIMVASTATIN 20 MG PO TABS
40.0000 mg | ORAL_TABLET | Freq: Every day | ORAL | Status: DC
  Administered 2021-05-20 – 2021-05-23 (×4): 40 mg via ORAL
  Filled 2021-05-20 (×4): qty 2

## 2021-05-20 MED ORDER — ANASTROZOLE 1 MG PO TABS
1.0000 mg | ORAL_TABLET | Freq: Every day | ORAL | Status: DC
  Administered 2021-05-20 – 2021-05-23 (×4): 1 mg via ORAL
  Filled 2021-05-20 (×5): qty 1

## 2021-05-20 MED ORDER — CLOPIDOGREL BISULFATE 75 MG PO TABS
75.0000 mg | ORAL_TABLET | Freq: Every day | ORAL | Status: DC
  Administered 2021-05-20 – 2021-05-23 (×4): 75 mg via ORAL
  Filled 2021-05-20 (×4): qty 1

## 2021-05-20 MED ORDER — INSULIN ASPART 100 UNIT/ML IJ SOLN
0.0000 [IU] | Freq: Three times a day (TID) | INTRAMUSCULAR | Status: DC
  Administered 2021-05-20: 3 [IU] via SUBCUTANEOUS
  Administered 2021-05-21 – 2021-05-22 (×2): 2 [IU] via SUBCUTANEOUS
  Administered 2021-05-22: 5 [IU] via SUBCUTANEOUS
  Administered 2021-05-23: 3 [IU] via SUBCUTANEOUS

## 2021-05-20 MED ORDER — TRAMADOL HCL 50 MG PO TABS: 50.0000 mg | ORAL_TABLET | Freq: Three times a day (TID) | ORAL | Status: DC | PRN

## 2021-05-20 NOTE — Progress Notes (Addendum)
STROKE           Anna Hunt is an 75 y.o. female.   Assessment/Plan: Episode of nausea vomiting of unclear etiology. Punctate infarcts seen unlikely to explain the etiology: Imaging results are reviewed in person and discussed with the family at the bedside.  Dual antiplatelet agents are recommended for 3 weeks and then the patient can revert back to Plavix 75 mg. 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    She is being fed today by her daughter.  The patient seems to be back to baseline for the most part.  The daughter reports that she has significant short-term memory impairment and this affects her care.  and instructions to follow medical advice she is currently staying at an assisted living facility.   Objective: Vital signs in last 24 hours: Temp:  [98.2 F (36.8 C)-102.6 F (39.2 C)] 100.1 F (37.8 C) (09/10 1423) Pulse Rate:  [80-114] 87 (09/10 1500) Resp:  [16-29] 21 (09/10 1500) BP: (106-138)/(57-82) 123/63 (09/10 1500) SpO2:  [87 %-98 %] 93 % (09/10 1500) Weight:  [77.1 kg] 77.1 kg (09/10 1109)   GENERAL: She is doing well at this time.  HEENT: Normal  ABDOMEN: soft  EXTREMITIES: No edema   BACK: Normal  SKIN: Normal by inspection.    MENTAL STATUS: She is awake and cooperate with the evaluation.  Spontaneous speech is significantly reduced but she is otherwise lucid.  Speech is mildly hypophonic but appears to be at baseline.  She follows commands briskly.  CRANIAL NERVES: Pupils are equal, round and reactive to light and accomodation; extra ocular movements are full, there is no significant nystagmus; visual fields are full; upper and lower facial muscles are normal in strength and symmetric, there is no flattening of the nasolabial folds; tongue is midline; uvula is midline; shoulder elevation is  normal.  MOTOR: Normal tone, bulk and strength; no pronator drift.  COORDINATION: Left finger to nose is normal, right finger to nose is normal, No rest tremor; no intention tremor; no postural tremor; no bradykinesia.  SENSATION: Normal to light touch, temperature, and pain.      Intake/Output from previous day: 09/09 0701 - 09/10 0700 In: 1282.7 [IV Piggyback:1282.7] Out: -  Intake/Output this shift: Total I/O In: 200 [IV Piggyback:200] Out: -  Nutritional status:  Diet Order             Diet Carb Modified Fluid consistency: Thin; Room service appropriate? Yes  Diet effective now                    Lab Results: Results for orders placed or performed during the hospital encounter of 05/19/21 (from the past 48 hour(s))  Ethanol     Status: None   Collection Time: 05/19/21  7:06 PM  Result Value Ref Range   Alcohol, Ethyl (B) <10 <10 mg/dL    Comment: (NOTE) Lowest detectable limit for serum alcohol is 10 mg/dL.  For medical purposes only. Performed at [facility], [place] [address] [place], [place] [address]   Protime-INR     Status: None   Collection Time: 05/19/21  7:06 PM  Result Value Ref Range   Prothrombin Time 13.6 11.4 - 15.2 seconds   INR 1.0 0.8 - 1.2    Comment: (NOTE) INR goal varies based on device and disease states.  Performed at [facility], [place] [address], [place], [place] [address]   APTT     Status: Abnormal   Collection Time: 05/19/21  7:06 PM  Result Value Ref Range   aPTT <20 (L) 24 - 36 seconds    Comment: Performed at [facility] [address] [place], [place] [address]  CBC     Status: Abnormal   Collection Time: 05/19/21  7:06 PM  Result Value Ref Range   WBC 5.0 4.0 - 10.5 K/uL   RBC 3.68 (L) 3.87 - 5.11 MIL/uL   Hemoglobin 11.4 (L) 12.0 - 15.0 g/dL   HCT 34.7 (L) 36.0 - 46.0 %   MCV 94.3 80.0 - 100.0 fL   MCH 31.0 26.0 - 34.0 pg   MCHC 32.9 30.0 - 36.0 g/dL   RDW 12.7 11.5 - 15.5 %   Platelets  171 150 - 400 K/uL   nRBC 0.0 0.0 - 0.2 %    Comment: Performed at [facility], [place] [address], [place], [place] [address]  Differential     Status: Abnormal   Collection Time: 05/19/21  7:06 PM  Result Value Ref Range   Neutrophils Relative % 79 %   Neutro Abs 4.0 1.7 - 7.7 K/uL   Lymphocytes Relative 8 %   Lymphs Abs 0.4 (L) 0.7 - 4.0 K/uL   Monocytes Relative 12 %   Monocytes Absolute 0.6 0.1 - 1.0 K/uL   Eosinophils Relative 0 %   Eosinophils Absolute 0.0 0.0 - 0.5 K/uL   Basophils Relative 1 %   Basophils Absolute 0.0 0.0 - 0.1 K/uL   Immature Granulocytes 0 %   Abs Immature Granulocytes 0.02 0.00 - 0.07 K/uL    Comment: Performed at [facility] [address] [place],  [address]  Comprehensive metabolic panel     Status: Abnormal   Collection Time: 05/19/21  7:06 PM  Result Value Ref Range   Sodium 135 135 - 145 mmol/L   Potassium 4.1 3.5 - 5.1 mmol/L   Chloride 99 98 - 111 mmol/L   CO2 25 22 - 32 mmol/L   Glucose, Bld 139 (H) 70 - 99 mg/dL    Comment: Glucose reference range applies only to samples taken after fasting for at least 8 hours.   BUN 16 8 - 23 mg/dL   Creatinine, Ser 1.11 (H) 0.44 - 1.00 mg/dL   Calcium 9.6 8.9 - 10.3 mg/dL   Total Protein 7.3 6.5 - 8.1 g/dL   Albumin 3.5 3.5 - 5.0 g/dL   AST 40 15 - 41 U/L   ALT 16 0 - 44 U/L   Alkaline Phosphatase 55 38 - 126 U/L   Total Bilirubin 0.7 0.3 - 1.2 mg/dL   GFR, Estimated 52 (L) >60 mL/min    Comment: (NOTE) Calculated using the CKD-EPI Creatinine Equation (2021)    Anion gap 11 5 - 15    Comment: Performed at [facility] [address] [place], [place] [address]  Troponin I (High Sensitivity)     Status: None   Collection Time: 05/19/21  7:06 PM  Result Value Ref Range   Troponin I (High Sensitivity) 5 <18 ng/L    Comment: (NOTE) Elevated high sensitivity troponin I (hsTnI) values and significant  changes across serial measurements may suggest ACS but many other   chronic and acute conditions are known to elevate hsTnI results.  Refer to the "Links" section for chest pain  algorithms and additional  guidance. Performed at [facility], [place] [address] [place], [place] [address]   Lipase, blood     Status: None   Collection Time: 05/19/21  7:06 PM  Result Value Ref Range   Lipase 29 11 - 51 U/L    Comment: Performed at [facility] [address] [place], [place] [address]  I-stat chem 8, ED     Status: Abnormal   Collection Time: 05/19/21  7:20 PM  Result Value Ref Range   Sodium 138 135 - 145 mmol/L   Potassium 4.2 3.5 - 5.1 mmol/L   Chloride 101 98 - 111 mmol/L   BUN 17 8 - 23 mg/dL   Creatinine, Ser 1.00 0.44 - 1.00 mg/dL   Glucose, Bld 145 (H) 70 - 99 mg/dL    Comment: Glucose reference range applies only to samples taken after fasting for at least 8 hours.   Calcium, Ion 1.16 1.15 - 1.40 mmol/L   TCO2 28 22 - 32 mmol/L   Hemoglobin 11.6 (L) 12.0 - 15.0 g/dL   HCT 34.0 (L) 36.0 - 46.0 %  Resp Panel by RT-PCR (Flu A&B, Covid) Nasopharyngeal Swab     Status: Abnormal   Collection Time: 05/19/21  8:34 PM   Specimen: Nasopharyngeal Swab; Nasopharyngeal(NP) swabs in vial transport medium  Result Value Ref Range   SARS Coronavirus 2 by RT PCR POSITIVE (A) NEGATIVE    Comment: RESULT CALLED TO, READ BACK BY AND VERIFIED WITH: RN BRENDA MICHAELSON 05/19/21'@23'$ :20 BY TW (NOTE) SARS-CoV-2 target nucleic acids are DETECTED.  The SARS-CoV-2 RNA is generally detectable in upper respiratory specimens during the acute phase of infection. Positive results are indicative of the presence of the identified virus, but do not rule out bacterial infection or co-infection with other pathogens not detected by the test. Clinical correlation with patient history and other diagnostic information is necessary to determine patient infection status. The expected result is Negative.  Fact Sheet for  Patients: EntrepreneurPulse.com.au  Fact Sheet for Healthcare Providers: IncredibleEmployment.be  This test is not yet approved or cleared by the [place] FDA and  has been authorized for detection and/or diagnosis of SARS-CoV-2 by FDA under an Emergency Use Authorization (EUA).  This EUA will remain in effect (meaning this test  can be used) for the duration of  the COVID-19 declaration under Section 564(b)(1) of the Act, 21 U.S.C. section 360bbb-3(b)(1), unless the authorization is terminated or revoked sooner.     Influenza A by PCR NEGATIVE NEGATIVE   Influenza B by PCR NEGATIVE NEGATIVE    Comment: (NOTE) The Xpert Xpress SARS-CoV-2/FLU/RSV plus assay is intended as an aid in the diagnosis of influenza from Nasopharyngeal swab specimens and should not be used as a sole basis for treatment. Nasal washings and aspirates are unacceptable for Xpert Xpress SARS-CoV-2/FLU/RSV testing.  Fact Sheet for Patients: EntrepreneurPulse.com.au  Fact Sheet for Healthcare Providers: IncredibleEmployment.be  This test is not yet approved or cleared by the [place] FDA and has been authorized for detection and/or diagnosis of SARS-CoV-2 by FDA under an Emergency Use Authorization (EUA). This EUA will remain in effect (meaning this test can be used) for the duration of the COVID-19 declaration under Section 564(b)(1) of the Act, 21 U.S.C. section 360bbb-3(b)(1), unless the authorization is terminated or revoked.  Performed at [facility], [place] [address] [place], [place] [address]   Troponin I (High Sensitivity)     Status: None   Collection  Time: 05/19/21  9:00 PM  Result Value Ref Range   Troponin I (High Sensitivity) 8 <18 ng/L    Comment: (NOTE) Elevated high sensitivity troponin I (hsTnI) values and significant  changes across serial measurements may suggest ACS but many other  chronic and  acute conditions are known to elevate hsTnI results.  Refer to the "Links" section for chest pain algorithms and additional  guidance. Performed at [facility], [place] [address], [place], [place] [address]   Valproic acid level     Status: Abnormal   Collection Time: 05/19/21  9:16 PM  Result Value Ref Range   Valproic Acid Lvl 37 (L) 50.0 - 100.0 ug/mL    Comment: Performed at [facility] [address], [place], [place] [address]  Ammonia     Status: None   Collection Time: 05/19/21  9:16 PM  Result Value Ref Range   Ammonia 23 9 - 35 umol/L    Comment: Performed at [facility], [place] [address] [place], [place] [address]  Vitamin B12     Status: None   Collection Time: 05/19/21  9:16 PM  Result Value Ref Range   Vitamin B-12 544 180 - 914 pg/mL    Comment: (NOTE) This assay is not validated for testing neonatal or myeloproliferative syndrome specimens for Vitamin B12 levels. Performed at [facility], [place] [address] [place], [place] [address]   TSH     Status: None   Collection Time: 05/19/21  9:16 PM  Result Value Ref Range   TSH 1.354 0.350 - 4.500 uIU/mL    Comment: Performed by a 3rd Generation assay with a functional sensitivity of <=0.01 uIU/mL. Performed at [facility], [place] [address] [place], [place] [address]   Urine rapid drug screen (hosp performed)     Status: None   Collection Time: 05/19/21  9:52 PM  Result Value Ref Range   Opiates NONE DETECTED NONE DETECTED   Cocaine NONE DETECTED NONE DETECTED   Benzodiazepines NONE DETECTED NONE DETECTED   Amphetamines NONE DETECTED NONE DETECTED   Tetrahydrocannabinol NONE DETECTED NONE DETECTED   Barbiturates NONE DETECTED NONE DETECTED    Comment: (NOTE) DRUG SCREEN FOR MEDICAL PURPOSES ONLY.  IF CONFIRMATION IS NEEDED FOR ANY PURPOSE, NOTIFY LAB WITHIN 5 DAYS.  LOWEST DETECTABLE LIMITS FOR URINE DRUG SCREEN Drug Class                     Cutoff (ng/mL) Amphetamine and  metabolites    1000 Barbiturate and metabolites    200 Benzodiazepine                 [id number] Tricyclics and metabolites     300 Opiates and metabolites        300 Cocaine and metabolites        300 THC                            50 Performed at [facility], [place] [address], [place], [place] [address]   Lipid panel     Status: Abnormal   Collection Time: 05/20/21  5:00 AM  Result Value Ref Range   Cholesterol 124 0 - 200 mg/dL   Triglycerides 75 <150 mg/dL   HDL 35 (L) >40 mg/dL   Total CHOL/HDL Ratio 3.5 RATIO   VLDL 15 0 - 40 mg/dL   LDL Cholesterol 74  0 - 99 mg/dL    Comment:        Total Cholesterol/HDL:CHD Risk Coronary Heart Disease Risk Table                     Men   Women  1/2 Average Risk   3.4   3.3  Average Risk       5.0   4.4  2 X Average Risk   9.6   7.1  3 X Average Risk  23.4   11.0        Use the calculated Patient Ratio above and the CHD Risk Table to determine the patient's CHD Risk.        ATP III CLASSIFICATION (LDL):  <100     mg/dL   Optimal  100-129  mg/dL   Near or Above                    Optimal  130-159  mg/dL   Borderline  160-189  mg/dL   High  >190     mg/dL   Very High Performed at [facility] [address], [place], [place] [address]   D-dimer, quantitative     Status: None   Collection Time: 05/20/21  5:00 AM  Result Value Ref Range   D-Dimer, Quant 0.29 0.00 - 0.50 ug/mL-FEU    Comment: (NOTE) At the manufacturer cut-off value of 0.5 g/mL FEU, this assay has a negative predictive value of 95-100%.This assay is intended for use in conjunction with a clinical pretest probability (PTP) assessment model to exclude pulmonary embolism (PE) and deep venous thrombosis (DVT) in outpatients suspected of PE or DVT. Results should be correlated with clinical presentation. Performed at [facility], [place] [address], [place], [place] [address]   C-reactive protein     Status: None   Collection Time: 05/20/21  5:00 AM   Result Value Ref Range   CRP <0.5 <1.0 mg/dL    Comment: Performed at [facility], [place] [address] [place], [place] [address]  Comprehensive metabolic panel     Status: Abnormal   Collection Time: 05/20/21  5:00 AM  Result Value Ref Range   Sodium 139 135 - 145 mmol/L   Potassium 4.1 3.5 - 5.1 mmol/L   Chloride 107 98 - 111 mmol/L   CO2 23 22 - 32 mmol/L   Glucose, Bld 92 70 - 99 mg/dL    Comment: Glucose reference range applies only to samples taken after fasting for at least 8 hours.   BUN 15 8 - 23 mg/dL   Creatinine, Ser 1.07 (H) 0.44 - 1.00 mg/dL   Calcium 8.9 8.9 - 10.3 mg/dL   Total Protein 6.4 (L) 6.5 - 8.1 g/dL   Albumin 2.9 (L) 3.5 - 5.0 g/dL   AST 38 15 - 41 U/L   ALT 15 0 - 44 U/L   Alkaline Phosphatase 44 38 - 126 U/L   Total Bilirubin 0.7 0.3 - 1.2 mg/dL   GFR, Estimated 54 (L) >60 mL/min    Comment: (NOTE) Calculated using the CKD-EPI Creatinine Equation (2021)    Anion gap 9 5 - 15    Comment: Performed at [facility], [place] [address] [place], [place] [address]  Magnesium     Status: None   Collection Time: 05/20/21  5:00 AM  Result Value Ref Range   Magnesium 1.7 1.7 - 2.4 mg/dL    Comment: Performed at  [facility], [place] [address] [place], [place] [address]  Phosphorus     Status: None   Collection Time: 05/20/21  5:00 AM  Result Value Ref Range   Phosphorus 3.8 2.5 - 4.6 mg/dL    Comment: Performed at [facility] [address], [place], [place] [address]  Urinalysis, Routine w reflex microscopic Urine, Clean Catch     Status: Abnormal   Collection Time: 05/20/21  7:48 AM  Result Value Ref Range   Color, Urine YELLOW YELLOW   APPearance CLEAR CLEAR   Specific Gravity, Urine 1.025 1.005 - 1.030   pH 6.0 5.0 - 8.0   Glucose, UA NEGATIVE NEGATIVE mg/dL   Hgb urine dipstick TRACE (A) NEGATIVE   Bilirubin Urine NEGATIVE NEGATIVE   Ketones, ur NEGATIVE NEGATIVE mg/dL   Protein, ur NEGATIVE NEGATIVE mg/dL   Nitrite POSITIVE  (A) NEGATIVE   Leukocytes,Ua MODERATE (A) NEGATIVE    Comment: Performed at [facility], [place] [address] [place], [place] [address]  Urinalysis, Microscopic (reflex)     Status: Abnormal   Collection Time: 05/20/21  7:48 AM  Result Value Ref Range   RBC / HPF 6-10 0 - 5 RBC/hpf   WBC, UA >50 0 - 5 WBC/hpf   Bacteria, UA RARE (A) NONE SEEN   Squamous Epithelial / LPF NONE SEEN 0 - 5    Comment: Performed at [facility], [place] [address] [place], [place] [address]  CBG monitoring, ED     Status: Abnormal   Collection Time: 05/20/21  8:58 AM  Result Value Ref Range   Glucose-Capillary 101 (H) 70 - 99 mg/dL    Comment: Glucose reference range applies only to samples taken after fasting for at least 8 hours.  CBG monitoring, ED     Status: Abnormal   Collection Time: 05/20/21 11:59 AM  Result Value Ref Range   Glucose-Capillary 120 (H) 70 - 99 mg/dL    Comment: Glucose reference range applies only to samples taken after fasting for at least 8 hours.  Hemoglobin A1c     Status: None   Collection Time: 05/20/21  1:13 PM  Result Value Ref Range   Hgb A1c MFr Bld 5.3 4.8 - 5.6 %    Comment: (NOTE) Pre diabetes:          5.7%-6.4%  Diabetes:              >6.4%  Glycemic control for   <7.0% adults with diabetes    Mean Plasma Glucose 105.41 mg/dL    Comment: Performed at [facility] [address] [place], [place] [address]  C-reactive protein     Status: None   Collection Time: 05/20/21  1:13 PM  Result Value Ref Range   CRP 0.5 <1.0 mg/dL    Comment: Performed at [facility], [place] [address] [place], [place] [address]  Ferritin     Status: None   Collection Time: 05/20/21  1:13 PM  Result Value Ref Range   Ferritin 126 11 - 307 ng/mL    Comment: Performed at [facility], [place] [address], [place], [place] [address]  D-dimer, quantitative     Status: None   Collection Time: 05/20/21  1:13 PM  Result Value Ref Range   D-Dimer, Quant <0.27 0.00 -  0.50 ug/mL-FEU    Comment: (NOTE) At the manufacturer cut-off value of 0.5 g/mL FEU, this assay has a negative predictive  value of 95-100%.This assay is intended for use in conjunction with a clinical pretest probability (PTP) assessment model to exclude pulmonary embolism (PE) and deep venous thrombosis (DVT) in outpatients suspected of PE or DVT. Results should be correlated with clinical presentation. Performed at [facility], [place] [address] [place], [place] [address]   CBC     Status: Abnormal   Collection Time: 05/20/21  1:13 PM  Result Value Ref Range   WBC 2.9 (L) 4.0 - 10.5 K/uL   RBC 3.23 (L) 3.87 - 5.11 MIL/uL   Hemoglobin 10.3 (L) 12.0 - 15.0 g/dL   HCT 31.0 (L) 36.0 - 46.0 %   MCV 96.0 80.0 - 100.0 fL   MCH 31.9 26.0 - 34.0 pg   MCHC 33.2 30.0 - 36.0 g/dL   RDW 12.7 11.5 - 15.5 %   Platelets 115 (L) 150 - 400 K/uL    Comment: Immature Platelet Fraction may be clinically indicated, consider ordering this additional test [id number] REPEATED TO VERIFY PLATELET COUNT CONFIRMED BY SMEAR    nRBC 0.0 0.0 - 0.2 %    Comment: Performed at [facility], [place] [address], [place], [place] [address]  Lactic acid, plasma     Status: None   Collection Time: 05/20/21  1:13 PM  Result Value Ref Range   Lactic Acid, Venous 1.0 0.5 - 1.9 mmol/L    Comment: Performed at [facility] [address], [place], [place] [address]    Lipid Panel Recent Labs    05/20/21 0500  CHOL 124  TRIG 75  HDL 35*  CHOLHDL 3.5  VLDL 15  LDLCALC 74    Studies/Results:   Medications:  Scheduled Meds:   stroke: mapping our early stages of recovery book   Does not apply Once   albuterol  2 puff Inhalation Q6H   anastrozole  1 mg Oral Daily   vitamin C  500 mg Oral Daily   clopidogrel  75 mg Oral Daily   dexamethasone  6 mg Oral Q24H   divalproex  250 mg Oral Daily   And   divalproex  500 mg Oral QHS   enoxaparin (LOVENOX) injection  40 mg Subcutaneous Q24H    insulin aspart  0-15 Units Subcutaneous TID WC   levothyroxine  25 mcg Oral q morning   PARoxetine  20 mg Oral QHS   simvastatin  40 mg Oral QHS   zinc sulfate  220 mg Oral Daily   Continuous Infusions:  cefTRIAXone (ROCEPHIN)  IV Stopped (05/20/21 1204)   remdesivir 100 mg in NS 100 mL Stopped (05/20/21 1304)   PRN Meds:.acetaminophen **OR** acetaminophen, guaiFENesin-dextromethorphan, LORazepam, ondansetron **OR** ondansetron (ZOFRAN) IV, traMADol     LOS: 0 days   Kavaughn Faucett A. Merlene Laughter, M.D.  Diplomate, Tax adviser of Psychiatry and Neurology ( Neurology).

## 2021-05-20 NOTE — Progress Notes (Signed)
Patient admitted to room 3w-19

## 2021-05-20 NOTE — ED Notes (Signed)
Report called to 3W nurse and given to [facility]

## 2021-05-20 NOTE — Progress Notes (Signed)
Code stroke called, noticeable left side facial droop, nih now an 8, family present at bedside.

## 2021-05-20 NOTE — Code Documentation (Signed)
Stroke Response Nurse Documentation Code Documentation  Anna Hunt is a 75 y.o. female admitted to [facility] on 9/9 for Acute CVA with past medical hx of Left occipital CVA, HTN, DM2, HLD, breast cancer. On clopidogrel 75 mg daily. Code stroke was activated by Nursing staff.   Patient from home where she was LKW at [place] and now complaining of increased facial droop, and word difficulty .   Stroke team at the bedside after patient activation. Patient to CT with team. NIHSS 3, see documentation for details and code stroke times. Patient with disoriented, dysarthria , and Sensory  neglect on exam. The following imaging was completed:  CT. Patient is not a candidate for IV Thrombolytic due to recent CVA. Patient is not a candidate for IR due to mild deficits (No LVO).   Bedside handoff with RN 3W Charge RN.    Madelynn Done  Rapid Response RN

## 2021-05-20 NOTE — Evaluation (Signed)
Physical Therapy Evaluation Patient Details Name: Anna Hunt MRN: [id number] DOB: 1946/05/05 Today's Date: 05/20/2021   History of Present Illness  The pt is a 75 yo female presenting 9/9 due to concern for stroke and episode of vomiting. MRI revealed possible punctate acute or subacute infarct in the left periventricular parietotemporal region. PMH includes: breast cancer, DM II, depression, HTN, LBBB, L occipital stroke, cerebral artery occlusion with cerebral infarction, and vertigo.   Clinical Impression  Pt in bed upon arrival of PT, agreeable to evaluation at this time. Prior to admission the pt reports she was living at ALF where she received assist with all mobility and ADLs from staff. The pt now presents with limitations in functional mobility, strength, power, safety awareness, processing, and activity tolerance due to above dx, and will continue to benefit from skilled PT to address these deficits. The pt was able to complete bed mobility and transition to sitting EOB with modA of 1 and significantly increased time and cues for technique/sequencing. The pt did complete x2 sit-stand transfers, but required maxA with blocking of bilateral knees and feet to prevent buckling and sliding at this time. Will require continued skilled PT to progress activity tolerance, strength, power, and stability needed to decrease caregiver burden and allow for greater independence with seated activities. Pt likely safe to return to ALF once medically stable, will benefit from HHPT if available at her facility.      Follow Up Recommendations  (return to ALF, HHPT if available at her ALF)    Equipment Recommendations  None recommended by PT    Recommendations for Other Services       Precautions / Restrictions Precautions Precautions: Fall Precaution Comments: watch O2 Restrictions Weight Bearing Restrictions: No      Mobility  Bed Mobility Overal bed mobility: Needs Assistance Bed Mobility:  Rolling;Supine to Sit;Sit to Supine Rolling: Mod assist   Supine to sit: Mod assist Sit to supine: Max assist   General bed mobility comments: modA with significantly increased time and effort to complete rolling and supine-sit transition. sequential cues with initiation from therapist. maxA to return to bed. increased difficulty as pt unable to reach floor from ED stretcher    Transfers Overall transfer level: Needs assistance Equipment used: 1 person hand held assist Transfers: Sit to/from Stand Sit to Stand: Max assist         General transfer comment: pt able to power up to standing with maxA of 1 with bilateral feet and knees blocked as pt going into full extension and sliding feet forward on initial attempt to stand. Pt then able to power up to standing, but unable to generate any steps from standing position  Ambulation/Gait             General Gait Details: pt unable  Modified Rankin (Stroke Patients Only) Modified Rankin (Stroke Patients Only) Pre-Morbid Rankin Score: Moderately severe disability Modified Rankin: Severe disability     Balance Overall balance assessment: Needs assistance Sitting-balance support: Single extremity supported;Feet unsupported Sitting balance-Leahy Scale: Poor Sitting balance - Comments: modA and max cues to maintain static sitting EOB Postural control: Posterior lean Standing balance support: Bilateral upper extremity supported;During functional activity Standing balance-Leahy Scale: Zero Standing balance comment: pt dependent on therapist assist                             Pertinent Vitals/Pain Pain Assessment: No/denies pain    Home Living Family/patient  expects to be discharged to:: Assisted living               Home Equipment: Nadon - 2 wheels;Shower seat      Prior Function Level of Independence: Needs assistance   Gait / Transfers Assistance Needed: pt reports using RW, states she requires assist  to walk "all the time"  ADL's / Homemaking Assistance Needed: pt states she recieves assist from staff for all ADLs and food is brought to her room        Hand Dominance   Dominant Hand: Right    Extremity/Trunk Assessment   Upper Extremity Assessment Upper Extremity Assessment: Defer to OT evaluation;Generalized weakness    Lower Extremity Assessment Lower Extremity Assessment: Generalized weakness (pt denies difference in sensation, but lacks strength bilaterally)    Cervical / Trunk Assessment Cervical / Trunk Assessment: Other exceptions Cervical / Trunk Exceptions: large body habitus, poor core control  Communication   Communication: No difficulties  Cognition Arousal/Alertness: Awake/alert Behavior During Therapy: Flat affect Overall Cognitive Status: Impaired/Different from baseline Area of Impairment: Attention;Following commands;Memory;Safety/judgement;Awareness;Problem solving                   Current Attention Level: Focused Memory: Decreased recall of precautions;Decreased short-term memory Following Commands: Follows one step commands with increased time;Follows one step commands inconsistently Safety/Judgement: Decreased awareness of safety;Decreased awareness of deficits   Problem Solving: Slow processing;Difficulty sequencing;Decreased initiation;Requires verbal cues General Comments: pt with delayed processing of all instructions and questions, able to answer with increased time. with commands/instructions, pt often repeating statement back to therapist but not performing with actions.      General Comments General comments (skin integrity, edema, etc.): SpO2 86-94% on 3L at rest in bed, maintained in 90s with sitting EOB and standing.    Exercises     Assessment/Plan    PT Assessment Patient needs continued PT services  PT Problem List Decreased strength;Decreased range of motion;Decreased activity tolerance;Decreased balance;Decreased  mobility;Decreased coordination;Decreased cognition;Decreased safety awareness;Decreased knowledge of use of DME;Pain       PT Treatment Interventions DME instruction;Gait training;Stair training;Therapeutic exercise;Therapeutic activities;Functional mobility training;Balance training;Neuromuscular re-education;Patient/family education    PT Goals (Current goals can be found in the Care Plan section)  Acute Rehab PT Goals Patient Stated Goal: none stated PT Goal Formulation: With patient Time For Goal Achievement: 06/03/21 Potential to Achieve Goals: Fair    Frequency Min 3X/week    AM-PAC PT "6 Clicks" Mobility  Outcome Measure Help needed turning from your back to your side while in a flat bed without using bedrails?: A Lot Help needed moving from lying on your back to sitting on the side of a flat bed without using bedrails?: A Lot Help needed moving to and from a bed to a chair (including a wheelchair)?: Total Help needed standing up from a chair using your arms (e.g., wheelchair or bedside chair)?: A Lot Help needed to walk in hospital room?: Total Help needed climbing 3-5 steps with a railing? : Total 6 Click Score: 9    End of Session Equipment Utilized During Treatment: Gait belt;Oxygen Activity Tolerance: Patient tolerated treatment well;Patient limited by fatigue Patient left: in bed;with call bell/phone within reach Nurse Communication: Mobility status PT Visit Diagnosis: Other abnormalities of gait and mobility (R26.89);Muscle weakness (generalized) (M62.81)    Time: [id number] PT Time Calculation (min) (ACUTE ONLY): 34 min   Charges:   PT Evaluation $PT Eval Moderate Complexity: 1 Mod PT Treatments $Therapeutic Activity: 8-22 mins  [place] Carbo, PT, DPT   Acute Rehabilitation Department Pager #: [phone]  Sandra Cockayne 05/20/2021, 2:10 PM

## 2021-05-20 NOTE — Progress Notes (Signed)
Called by RN that pt daughter at bedside and pt with change in condition. Is staring off and not able to follow commands.  Code stroke was called and Neurology, Dr. Rory Percy, has evaluated pt and taken pt to CT for STAT CT head.  Daughter reports that her mother stopped talking to her and could not follow simple commands. Awaiting CT results and recommendations from Neurology

## 2021-05-20 NOTE — Progress Notes (Addendum)
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  Patient was able to answer simple questions. Patient seen in the ED,temperature 98.2 F, pulse 98, respiration 16, BP 130/83 mmHg O2 sat 94% on room air.  Blood work normal troponin lipase stable CBC, covid+ Cxr-did not show any active disease.  CT head without contrast did not show any acute intracranial process.  There were stable chronic ischemic changes throughout the white matter and left occipital lobe.  MRI brain with without contrast showed possible punctate acute or subacute infarct in the left periventricular parietotemporal region.  There are remote left temporo-occipital and right cerebellar infarcts with moderate to outlines chronic microvascular ischemic change in disease. Neurology was consulted and patient admitted-for acute encephalopathy, subacute stroke.  Subjective: Seen/examined On RA spo2 at 86%- Informed RN to place on [place] Patient is alert awake oriented to self place date of birth but thinks [name] is the president mildly confused  Assessment & Plan:  Acute metabolic encephalopathy:  with background MCI.Patient has COVID +,UDS negative CT head/MRI brain with subacute stroke.  No fever or leukocytosis, checked urine to rule out UTI UA with pyuria WBC more than 50, positive nitrate and leukocytes suspect she has UTI, suspect encephalopathy due to her UTI as well as COVID-positive  b12, tsh normal.   EEG shows mild to moderate diffuse encephalopathy.  Follow-up TSH, Depakote and ammonia level.  Keep on delirium precaution.  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 Some worsening 2/2. Had having mild cognitive impairment/memory changes.  Subacute stroke: LDL stable at 74, follow-up HbA1c, Neurology following, complete stroke work-up  as belwo per neuro: - Frequent neuro checks - Echocardiogram/CTA head/neck-  -Continue Plavix, simvastatin  as per neurology.  - Risk factor modification - Telemetry monitoring; and cardiac monitor - Blood pressure goal   - Normotension  - Management of hypertensive emergency/urgency per standard protocols  - Permissive hypertension to 220/120 .  cont PT consult, OT Consult.   Acute hypoxic respiratory failure likely from COVID  COVID-positive - chest x-ray clear, D-dimer /CRP.  Keep on supplemental nasal cannula multivitamins and zinc ascorbic acid along with Decadron-since she is hypoxic, continue remdesivir pharmacy to dose.  Continue supplemental oxygen.  Continue airborne/contact precaution  Recent Labs    05/20/21 0500  DDIMER 0.29  CRP <0.5   UTI start on antibiotics-Rocephin, send urine culture.  Previously had pansensitive E. coli UTI in February 2022.this afternoon she spiked temp, blood culture sent, lactic acid ordered.  HTN: BP stable.  Holding meds.  Malignant neoplasm of upper-inner quadrant of left breast in female, estrogen receptor positive: Continue her anastrozole.  Type 2 diabetes mellitus with hyperglycemia.  Check hemoglobin A1c, continue sliding scale insulin.  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   Normocytic anemia:Monitor hemoglobin.  History of depression:Mood is stable resume her multiple home meds  Hyperlipidemia:Stable lipid panel,  continue simvastatin  [id number] is POA. Full code for now and I encouraged to discuss   Diet Order             Diet Carb Modified Fluid consistency: Thin; Room service appropriate? Yes  Diet effective now                 Patient's Body mass index is 31.09 kg/m.  DVT prophylaxis: enoxaparin (LOVENOX) injection 40 mg Start: 05/19/21 2300 Code Status:   Code Status: Full Code  Family Communication:plan of care discussed with patient at bedside. updated daughter.  Status [id number] as observation-changed to inpatient. Remains hospitalized for ongoing management of encephalopathy stroke work-up and COVID infection Dispo: The patient is from: ALF              Anticipated d/c is to: SNF              Patient currently is not medically stable to d/c.   Difficult to place patient No  Unresulted Labs (From admission, onward)     Start     Ordered   05/26/21 0500  Creatinine, serum  (enoxaparin (LOVENOX)    CrCl >/= 30 ml/min)  Weekly,   R     Comments: while on enoxaparin therapy    05/19/21 2105   05/20/21 1132  Lactic acid, plasma  STAT Now then every 3 hours,   R (with STAT occurrences)      05/20/21 1131   05/20/21 1132  Culture, blood (Routine X 2) w Reflex to ID Panel  BLOOD CULTURE X 2,   R (with STAT occurrences)      05/20/21 1131   05/20/21 1017  Urine Culture  Add-on,   AD       Question:  Indication  Answer:  Dysuria   05/20/21 1016   05/20/21 0700  CBC  Once,   R        05/20/21 0700   05/20/21 0500  Hemoglobin A1c  (Labs)  Tomorrow morning,   R        05/19/21 2105   05/20/21 0500  D-dimer, quantitative  Daily,   R      05/19/21 2325   05/20/21 0500  C-reactive protein  Daily,   R      05/19/21 2325   05/20/21 0500  Comprehensive metabolic panel  Daily,   R      05/19/21 2325   05/20/21 0500  CBC with Differential/Platelet  Daily,   R      05/19/21 2325   05/20/21 0500  Magnesium  Daily,   R      05/19/21 2325   05/20/21 0500  Phosphorus  Daily,   R       05/19/21 2325   05/19/21 2325  Procalcitonin  Add-on,   AD        05/19/21 2325   05/19/21 2325  Ferritin  Add-on,   AD        05/19/21 2325   05/19/21 2325  D-dimer, quantitative  Add-on,   AD        05/19/21 2325   05/19/21 2324  C-reactive protein  Add-on,   AD        05/19/21 2325           Medications reviewed:  Scheduled Meds:   stroke: mapping our early stages of recovery book   Does not apply Once   albuterol  2 puff Inhalation Q6H   anastrozole  1 mg Oral Daily  vitamin C  500 mg Oral Daily   clopidogrel  75 mg Oral Daily   dexamethasone  6 mg Oral Q24H   divalproex  250 mg Oral Daily   And   divalproex  500 mg Oral QHS   enoxaparin (LOVENOX) injection  40 mg Subcutaneous Q24H   insulin aspart  0-15 Units Subcutaneous TID WC   levothyroxine  25 mcg Oral q morning   PARoxetine  20 mg Oral QHS   simvastatin  40 mg Oral QHS   zinc sulfate  220 mg Oral Daily   Continuous Infusions:  cefTRIAXone (ROCEPHIN)  IV Stopped (05/20/21 1204)   remdesivir 100 mg in NS 100 mL Stopped (05/20/21 1304)   Consultants:see note  Procedures:see note Antimicrobials: Anti-infectives (From admission, onward)    Start     Dose/Rate Route Frequency Ordered Stop   05/20/21 1030  cefTRIAXone (ROCEPHIN) 1 g in sodium chloride 0.9 % 100 mL IVPB        1 g 200 mL/hr over 30 Minutes Intravenous Every 24 hours 05/20/21 1017 05/25/21 1029   05/20/21 1000  remdesivir 100 mg in sodium chloride 0.9 % 100 mL IVPB       See Hyperspace for full Linked Orders Report.   100 mg 200 mL/hr over 30 Minutes Intravenous Daily 05/19/21 2325 05/24/21 0959   05/19/21 2359  remdesivir 200 mg in sodium chloride 0.9% 250 mL IVPB       See Hyperspace for full Linked Orders Report.   200 mg 580 mL/hr over 30 Minutes Intravenous Once 05/19/21 2325 05/20/21 0108      Culture/Microbiology    Component Value Date/Time   SDES URINE, RANDOM 11/02/2020 0130   SPECREQUEST  11/02/2020 0130    NONE Performed  at [facility] [address], [place], [place] [address]    CULT >=100,000 COLONIES/mL ESCHERICHIA COLI (A) 11/02/2020 0130   REPTSTATUS 11/04/2020 FINAL 11/02/2020 0130    Other culture-see note  Objective: Vitals: Today's Vitals   05/20/21 1109 05/20/21 1130 05/20/21 1227 05/20/21 1339  BP: 122/61 137/66    Pulse: (!) 101 89 90   Resp: (!) 28 (!) 27 (!) 24   Temp: (!) 102.6 F (39.2 C)  (!) 102.2 F (39 C)   TempSrc: Rectal     SpO2: 94% 93% 90%   Weight: 77.1 kg     Height: '5\' 2"'$  (1.575 m)     PainSc: 0-No pain   0-No pain    Intake/Output Summary (Last 24 hours) at 05/20/2021 1352 Last data filed at 05/20/2021 1304 Gross per 24 hour  Intake 1482.73 ml  Output --  Net 1482.73 ml   Filed Weights   05/20/21 1109  Weight: 77.1 kg   Weight change:   Intake/Output from previous day: 09/09 0701 - 09/10 0700 In: 1282.7 [IV Piggyback:1282.7] Out: -  Intake/Output this shift: Total I/O In: 200 [IV Piggyback:200] Out: -  Filed Weights   05/20/21 1109  Weight: 77.1 kg    Examination: General exam: AAO x2, ill looking, frail, older than stated age, weak appearing. HEENT:Oral mucosa moist, Ear/Nose WNL grossly,dentition normal. Respiratory system: bilaterally diminished, no use of accessory muscle, non tender. Cardiovascular system: S1 & S2 +,No JVD. Gastrointestinal system: Abdomen soft, NT,ND, BS+. Nervous System:Alert, awake, moving upper extremities well minimal movement in the lower extremities Extremities: no edema, distal peripheral pulses palpable.  Skin: No rashes,no icterus. MSK: Normal muscle bulk,tone, power  Data Reviewed: I have personally reviewed following labs  and imaging studies CBC: Recent Labs  Lab 05/19/21 1906 05/19/21 1920  WBC 5.0  --   NEUTROABS 4.0  --   HGB 11.4* 11.6*  HCT 34.7* 34.0*  MCV 94.3  --   PLT 171  --    Basic Metabolic Panel: Recent Labs  Lab 05/19/21 1906 05/19/21 1920 05/20/21 0500  NA 135 138 139   K 4.1 4.2 4.1  CL 99 101 107  CO2 25  --  23  GLUCOSE 139* 145* 92  BUN '16 17 15  '$ CREATININE 1.11* 1.00 1.07*  CALCIUM 9.6  --  8.9  MG  --   --  1.7  PHOS  --   --  3.8   GFR: Estimated Creatinine Clearance: 43.7 mL/min (A) (by C-G formula based on SCr of 1.07 mg/dL (H)). Liver Function Tests: Recent Labs  Lab 05/19/21 1906 05/20/21 0500  AST 40 38  ALT 16 15  ALKPHOS 55 44  BILITOT 0.7 0.7  PROT 7.3 6.4*  ALBUMIN 3.5 2.9*   Recent Labs  Lab 05/19/21 1906  LIPASE 29   Recent Labs  Lab 05/19/21 2116  AMMONIA 23   Coagulation Profile: Recent Labs  Lab 05/19/21 1906  INR 1.0   Cardiac Enzymes: No results for input(s): CKTOTAL, CKMB, CKMBINDEX, TROPONINI in the last 168 hours. BNP (last 3 results) No results for input(s): PROBNP in the last 8760 hours. HbA1C: No results for input(s): HGBA1C in the last 72 hours. CBG: Recent Labs  Lab 05/20/21 0858 05/20/21 1159  GLUCAP 101* 120*   Lipid Profile: Recent Labs    05/20/21 0500  CHOL 124  HDL 35*  LDLCALC 74  TRIG 75  CHOLHDL 3.5   Thyroid Function Tests: Recent Labs    05/19/21 2116  TSH 1.354   Anemia Panel: Recent Labs    05/19/21 2116  VITAMINB12 544   Sepsis Labs: No results for input(s): PROCALCITON, LATICACIDVEN in the last 168 hours.  Recent Results (from the past 240 hour(s))  Resp Panel by RT-PCR (Flu A&B, Covid) Nasopharyngeal Swab     Status: Abnormal   Collection Time: 05/19/21  8:34 PM   Specimen: Nasopharyngeal Swab; Nasopharyngeal(NP) swabs in vial transport medium  Result Value Ref Range Status   SARS Coronavirus 2 by RT PCR POSITIVE (A) NEGATIVE Final    Comment: RESULT CALLED TO, READ BACK BY AND VERIFIED WITH: RN BRENDA MICHAELSON 05/19/21'@23'$ :20 BY TW (NOTE) SARS-CoV-2 target nucleic acids are DETECTED.  The SARS-CoV-2 RNA is generally detectable in upper respiratory specimens during the acute phase of infection. Positive results are indicative of the presence of  the identified virus, but do not rule out bacterial infection or co-infection with other pathogens not detected by the test. Clinical correlation with patient history and other diagnostic information is necessary to determine patient infection status. The expected result is Negative.  Fact Sheet for Patients: EntrepreneurPulse.com.au  Fact Sheet for Healthcare Providers: IncredibleEmployment.be  This test is not yet approved or cleared by the [place] FDA and  has been authorized for detection and/or diagnosis of SARS-CoV-2 by FDA under an Emergency Use Authorization (EUA).  This EUA will remain in effect (meaning this test  can be used) for the duration of  the COVID-19 declaration under Section 564(b)(1) of the Act, 21 U.S.C. section 360bbb-3(b)(1), unless the authorization is terminated or revoked sooner.     Influenza A by PCR NEGATIVE NEGATIVE Final   Influenza B by PCR NEGATIVE NEGATIVE Final    Comment: (  NOTE) The Xpert Xpress SARS-CoV-2/FLU/RSV plus assay is intended as an aid in the diagnosis of influenza from Nasopharyngeal swab specimens and should not be used as a sole basis for treatment. Nasal washings and aspirates are unacceptable for Xpert Xpress SARS-CoV-2/FLU/RSV testing.  Fact Sheet for Patients: EntrepreneurPulse.com.au  Fact Sheet for Healthcare Providers: IncredibleEmployment.be  This test is not yet approved or cleared by the [place] FDA and has been authorized for detection and/or diagnosis of SARS-CoV-2 by FDA under an Emergency Use Authorization (EUA). This EUA will remain in effect (meaning this test can be used) for the duration of the COVID-19 declaration under Section 564(b)(1) of the Act, 21 U.S.C. section 360bbb-3(b)(1), unless the authorization is terminated or revoked.  Performed at [facility], [place] [address] [place], [place] [address]       Radiology Studies: CT HEAD WO CONTRAST  Result Date: 05/19/2021 CLINICAL DATA:  Neurologic deficit, nausea and vomiting EXAM: CT HEAD WITHOUT CONTRAST TECHNIQUE: Contiguous axial images were obtained from the base of the skull through the vertex without intravenous contrast. COMPARISON:  01/31/2021 FINDINGS: Brain: Stable encephalomalacia in the left occipital region compatible with prior cortical infarct. There are stable hypodensities throughout the periventricular white matter compatible with chronic small vessel ischemic changes. No signs of acute infarct or hemorrhage. The lateral ventricles and remaining midline structures are unremarkable. No acute extra-axial fluid collections. No mass effect. Vascular: Stable atherosclerosis.  No hyperdense vessel. Skull: Normal. Negative for fracture or focal lesion. Sinuses/Orbits: No acute finding. Other: None. IMPRESSION: 1. No acute intracranial process. 2. Stable chronic ischemic changes throughout the white matter and left occipital lobe. Electronically Signed   By: Randa Ngo M.D.   On: 05/19/2021 19:05   MR BRAIN WO CONTRAST  Result Date: 05/19/2021 CLINICAL DATA:  Transient ischemic attack (TIA) EXAM: MRI HEAD WITHOUT CONTRAST TECHNIQUE: Multiplanar, multiecho pulse sequences of the brain and surrounding structures were obtained without intravenous contrast. COMPARISON:  Same-day CT head.  MRI June 22 2020. FINDINGS: Brain: Punctate focus of DWI hyperintensity without definite ADC correlate in the left periventricular parietotemporal region (series 5, image 72). Slight T2 hyperintensity in this region. Remote left temporooccipital infarct with encephalomalacia. Remote small right cerebellar lacunar infarcts. Moderate to advanced patchy and confluent T2 hyperintensity within the white matter, nonspecific but compatible with chronic microvascular ischemic disease. Moderate atrophy with ex vacuo ventricular dilation. No acute hemorrhage,  hydrocephalus, mass lesion, midline shift or extra-axial fluid collection. Vascular: Major arterial flow voids are maintained at the skull base. Skull and upper cervical spine: Normal marrow signal. Sinuses/Orbits: Clear sinuses.  No acute orbital findings. Other: No sizable mastoid effusions. IMPRESSION: 1. Possible punctate acute or subacute infarct in the left periventricular parietotemporal region. 2. Remote left temporooccipital and right cerebellar infarcts with moderate to advanced chronic microvascular ischemic disease. Electronically Signed   By: Margaretha Sheffield M.D.   On: 05/19/2021 20:17   DG Chest Port 1 View  Result Date: 05/19/2021 CLINICAL DATA:  Weakness EXAM: PORTABLE CHEST 1 VIEW COMPARISON:  06/01/2007 FINDINGS: The heart size and mediastinal contours are within normal limits when accounting for low lung volumes and technique. No focal pulmonary opacity. No pleural effusion or pneumothorax. No acute osseous abnormality. IMPRESSION: No active disease. Electronically Signed   By: Merilyn Baba M.D.   On: 05/19/2021 19:06   EEG adult  Result Date: 05/20/2021 Lora Havens, MD     05/20/2021  8:42 AM Patient Name: Anna Hunt MRN: [id number] Epilepsy  Attending: Lora Havens Referring Physician/Provider: Dr Amie [place] Date: 05/19/2021 Duration: 23.36 mins Patient history:75 year old with past history of breast cancer, diabetes, prior large left occipital stroke with some residual right hemianopsia and vertigo presented with nausea and vomiting and some confusion as well as slowness of speech. EEG to evaluate for seizure Level of alertness: Awake AEDs during EEG study: VPA Technical aspects: This EEG study was done with scalp electrodes positioned according to the 10-20 International system of electrode placement. Electrical activity was acquired at a sampling rate of '500Hz'$  and reviewed with a high frequency filter of '70Hz'$  and a low frequency filter of '1Hz'$ . EEG data were recorded  continuously and digitally stored. Description: The posterior dominant rhythm consists of 7.'5Hz'$  activity of moderate voltage (25-35 uV) seen predominantly in posterior head regions, symmetric and reactive to eye opening and eye closing. EEG showed continuous generalized polymorphic predominantly 5 to 7 Hz theta as well as intermittent 2-'3hz'$  delta slowing. Hyperventilation and photic stimulation were not performed.   ABNORMALITY - Continuous slow, generalized IMPRESSION: This study is suggestive of mild to moderate diffuse encephalopathy, nonspecific etiology. No seizures or epileptiform discharges were seen throughout the recording. Lora Havens   ECHOCARDIOGRAM COMPLETE  Result Date: 05/20/2021    ECHOCARDIOGRAM REPORT   Patient Name:   Anna Hunt Date of Exam: 05/20/2021 Medical Rec #:  [id number]      Height:       62.0 in Accession #:    [id number]     Weight:       160.1 lb Date of Birth:  [birth year]       BSA:          1.739 m Patient Age:    24 years       BP:           136/62 mmHg Patient Gender: F              HR:           101 bpm. Exam Location:  Inpatient Procedure: 2D Echo, Cardiac Doppler and Color Doppler Indications:    CVA  History:        Patient has prior history of Echocardiogram examinations, most                 recent 06/29/2020. TIA, Arrythmias:LBBB; Risk                 Factors:Hypertension and Diabetes.  Sonographer:    Spray Referring Phys: [id number] [place]  1. Systolic anterior motion of mitral valve with mildly elevated LVOT gradient at rest (2 m/s).  2. Left ventricular ejection fraction, by estimation, is 60 to 65%. The left ventricle has normal function. The left ventricle has no regional wall motion abnormalities. There is mild left ventricular hypertrophy. Left ventricular diastolic parameters are consistent with Grade I diastolic dysfunction (impaired relaxation).  3. Right ventricular systolic function is normal. The right ventricular size is normal.  There is moderately elevated pulmonary artery systolic pressure.  4. The mitral valve is normal in structure. Mild mitral valve regurgitation. No evidence of mitral stenosis.  5. The aortic valve is tricuspid. Aortic valve regurgitation is not visualized. Mild aortic valve sclerosis is present, with no evidence of aortic valve stenosis.  6. The inferior vena cava is normal in size with greater than 50% respiratory variability, suggesting right atrial pressure of 3 mmHg. FINDINGS  Left Ventricle: Left ventricular ejection fraction, by estimation, is 60 to  65%. The left ventricle has normal function. The left ventricle has no regional wall motion abnormalities. The left ventricular internal cavity size was normal in size. There is  mild left ventricular hypertrophy. Abnormal (paradoxical) septal motion, consistent with left bundle branch block. Left ventricular diastolic parameters are consistent with Grade I diastolic dysfunction (impaired relaxation). Right Ventricle: The right ventricular size is normal. Right ventricular systolic function is normal. There is moderately elevated pulmonary artery systolic pressure. The tricuspid regurgitant velocity is 3.28 m/s, and with an assumed right atrial pressure of 3 mmHg, the estimated right ventricular systolic pressure is [id number]. Left Atrium: Left atrial size was normal in size. Right Atrium: Right atrial size was normal in size. Pericardium: There is no evidence of pericardial effusion. Mitral Valve: The mitral valve is normal in structure. Mild mitral valve regurgitation. No evidence of mitral valve stenosis. Tricuspid Valve: The tricuspid valve is normal in structure. Tricuspid valve regurgitation is mild . No evidence of tricuspid stenosis. Aortic Valve: The aortic valve is tricuspid. Aortic valve regurgitation is not visualized. Mild aortic valve sclerosis is present, with no evidence of aortic valve stenosis. Aortic valve mean gradient measures 9.0 mmHg. Aortic  valve peak gradient measures 16.6 mmHg. Aortic valve area, by VTI measures 3.40 cm. Pulmonic Valve: The pulmonic valve was not well visualized. Pulmonic valve regurgitation is not visualized. No evidence of pulmonic stenosis. Aorta: The aortic root is normal in size and structure. Venous: The inferior vena cava is normal in size with greater than 50% respiratory variability, suggesting right atrial pressure of 3 mmHg. IAS/Shunts: No atrial level shunt detected by color flow Doppler. Additional Comments: Systolic anterior motion of mitral valve with mildly elevated LVOT gradient at rest (2 m/s).  LEFT VENTRICLE PLAX 2D LVIDd:         3.40 cm     Diastology LVIDs:         2.50 cm     LV e' medial:    6.09 cm/s LV PW:         1.50 cm     LV E/e' medial:  15.6 LV IVS:        1.30 cm     LV e' lateral:   16.50 cm/s LVOT diam:     2.20 cm     LV E/e' lateral: 5.7 LV SV:         145 LV SV Index:   83 LVOT Area:     3.80 cm  LV Volumes (MOD) LV vol d, MOD A4C: 37.1 ml LV vol s, MOD A4C: 15.8 ml LV SV MOD A4C:     37.1 ml RIGHT VENTRICLE             IVC RV S prime:     15.70 cm/s  IVC diam: 1.40 cm TAPSE (M-mode): 1.8 cm LEFT ATRIUM             Index       RIGHT ATRIUM           Index LA diam:        2.60 cm 1.50 cm/m  RA Area:     12.10 cm LA Vol (A2C):   35.1 ml 20.18 ml/m RA Volume:   25.50 ml  14.66 ml/m LA Vol (A4C):   31.4 ml 18.06 ml/m LA Biplane Vol: 33.4 ml 19.21 ml/m  AORTIC VALVE AV Area (Vmax):    3.78 cm AV Area (Vmean):   3.64 cm AV Area (VTI):  3.40 cm AV Vmax:           204.00 cm/s AV Vmean:          142.000 cm/s AV VTI:            0.426 m AV Peak Grad:      16.6 mmHg AV Mean Grad:      9.0 mmHg LVOT Vmax:         203.00 cm/s LVOT Vmean:        136.000 cm/s LVOT VTI:          0.381 m LVOT/AV VTI ratio: 0.89  AORTA Ao Root diam: 3.20 cm Ao Asc diam:  3.40 cm MITRAL VALVE                TRICUSPID VALVE MV Area (PHT): 3.55 cm     TR Peak grad:   43.0 mmHg MR Peak grad: 113.6 mmHg    TR Vmax:         328.00 cm/s MR Vmax:      533.00 cm/s MV E velocity: 94.80 cm/s   SHUNTS MV A velocity: 134.00 cm/s  Systemic VTI:  0.38 m MV E/A ratio:  0.71         Systemic Diam: 2.20 cm Kirk Ruths MD Electronically signed by Kirk Ruths MD Signature Date/Time: 05/20/2021/1:34:42 PM    Final      LOS: 0 days   Antonieta Pert, MD [facility]  05/20/2021, 1:52 PM

## 2021-05-20 NOTE — ED Notes (Signed)
Lunch tray ordered 

## 2021-05-20 NOTE — ED Notes (Signed)
Patient placed on oxygen at 2l for sats of 88%

## 2021-05-20 NOTE — Procedures (Signed)
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 EEG to evaluate for seizure  Level of alertness: Awake  AEDs during EEG study: VPA  Technical aspects: This EEG study was done with scalp electrodes positioned according to the 10-20 International system of electrode placement. Electrical activity was acquired at a sampling rate of '500Hz'$  and reviewed with a high frequency filter of '70Hz'$  and a low frequency filter of '1Hz'$ . EEG data were recorded continuously and digitally stored.   Description: The posterior dominant rhythm consists of 7.'5Hz'$  activity of moderate voltage (25-35 uV) seen predominantly in posterior head regions, symmetric and reactive to eye opening and eye closing. EEG showed continuous generalized polymorphic predominantly 5 to 7 Hz theta as well as intermittent 2-'3hz'$  delta slowing. Hyperventilation and photic stimulation were not performed.     ABNORMALITY - Continuous slow, generalized  IMPRESSION: This study is suggestive of mild to moderate diffuse encephalopathy, nonspecific etiology. No seizures or epileptiform discharges were seen throughout the recording.  Naela Nodal Barbra Sarks

## 2021-05-20 NOTE — Significant Event (Signed)
Please see progress note by me from same date for details.   -- Amie Portland, MD Neurologist [facility] Pager: [phone]

## 2021-05-20 NOTE — Progress Notes (Signed)
Neurology Progress Note-significant event-code stroke called   S:// Code stroke called on the patient with noticeable left-sided facial droop and slurred speech. NIH worsened by 5 points according to RN who received the patient from the ER Family member at bedside-reports that the patient's whole mouth looks droopy.  She also felt that the patient is not talking with as much clarity as she usually does. The daughter is visibly very distraught at this site.   O:// Current vital signs: BP 116/63 (BP Location: Right Arm)   Pulse 78   Temp 99.8 F (37.7 C) (Oral)   Resp 16   Ht '5\' 2"'$  (1.575 m)   Wt 77.1 kg   SpO2 98%   BMI 31.09 kg/m  Vital signs in last 24 hours: Temp:  [98.7 F (37.1 C)-102.6 F (39.2 C)] 99.8 F (37.7 C) (09/10 1958) Pulse Rate:  [73-108] 78 (09/10 1958) Resp:  [16-29] 16 (09/10 1958) BP: (106-138)/(57-76) 116/63 (09/10 1958) SpO2:  [87 %-99 %] 98 % (09/10 1958) Weight:  [77.1 kg] 77.1 kg (09/10 1109) Neurological exam Awake alert oriented to self, place, correct age.  Incorrect month. Mild dysarthria No evidence of aphasia Diminished attention concentration Cranial nerves: Pupils equal round react to light, extraocular movements intact, visual examination again limited just like yesterday but questionable right lower quadrantanopsia versus hemianopsia, Katrina Stack her face was symmetric at rest and on smiling, tongue and palate midline. She remains 4/5 in lower extremities bilaterally 5/5 in upper extremities bilaterally Sensation intact Mild action tremor with no dysmetria NIH stroke scale-3  Medications  Current Facility-Administered Medications:     stroke: mapping our early stages of recovery book, , Does not apply, Once, Reubin Milan, MD   acetaminophen (TYLENOL) tablet 650 mg, 650 mg, Oral, Q6H PRN, 650 mg at 05/20/21 1111 **OR** acetaminophen (TYLENOL) suppository 650 mg, 650 mg, Rectal, Q6H PRN, Reubin Milan, MD   albuterol (VENTOLIN  HFA) 108 (90 Base) MCG/ACT inhaler 2 puff, 2 puff, Inhalation, Q6H, Reubin Milan, MD, 2 puff at 05/20/21 1117   anastrozole (ARIMIDEX) tablet 1 mg, 1 mg, Oral, Daily, Reubin Milan, MD, 1 mg at 05/20/21 1119   ascorbic acid (VITAMIN C) tablet 500 mg, 500 mg, Oral, Daily, Reubin Milan, MD, 500 mg at 05/20/21 1113   cefTRIAXone (ROCEPHIN) 1 g in sodium chloride 0.9 % 100 mL IVPB, 1 g, Intravenous, Q24H, Kc, Ramesh, MD, Stopped at 05/20/21 1204   clopidogrel (PLAVIX) tablet 75 mg, 75 mg, Oral, Daily, Reubin Milan, MD, 75 mg at 05/20/21 1117   dexamethasone (DECADRON) tablet 6 mg, 6 mg, Oral, Q24H, Kc, Ramesh, MD, 6 mg at 05/20/21 1116   divalproex (DEPAKOTE ER) 24 hr tablet 250 mg, 250 mg, Oral, Daily, 250 mg at 05/20/21 1119 **AND** divalproex (DEPAKOTE ER) 24 hr tablet 500 mg, 500 mg, Oral, QHS, Reubin Milan, MD   enoxaparin (LOVENOX) injection 40 mg, 40 mg, Subcutaneous, Q24H, Reubin Milan, MD, 40 mg at 05/20/21 0039   guaiFENesin-dextromethorphan (ROBITUSSIN DM) 100-10 MG/5ML syrup 10 mL, 10 mL, Oral, Q4H PRN, Reubin Milan, MD   insulin aspart (novoLOG) injection 0-15 Units, 0-15 Units, Subcutaneous, TID WC, Reubin Milan, MD, 3 Units at 05/20/21 1939   levothyroxine (SYNTHROID) tablet 25 mcg, 25 mcg, Oral, q morning, Reubin Milan, MD, 25 mcg at 05/20/21 0615   LORazepam (ATIVAN) injection 1 mg, 1 mg, Intravenous, Once PRN, Reubin Milan, MD   ondansetron [facility]) tablet 4 mg, 4 mg,  Oral, Q6H PRN **OR** ondansetron (ZOFRAN) injection 4 mg, 4 mg, Intravenous, Q6H PRN, Reubin Milan, MD   PARoxetine (PAXIL) tablet 20 mg, 20 mg, Oral, QHS, Reubin Milan, MD   [COMPLETED] remdesivir 200 mg in sodium chloride 0.9% 250 mL IVPB, 200 mg, Intravenous, Once, Stopped at 05/20/21 0108 **FOLLOWED BY** remdesivir 100 mg in sodium chloride 0.9 % 100 mL IVPB, 100 mg, Intravenous, Daily, Reubin Milan, MD, Stopped at 05/20/21 1304    simvastatin (ZOCOR) tablet 40 mg, 40 mg, Oral, QHS, Reubin Milan, MD   traMADol [facility]) tablet 50 mg, 50 mg, Oral, Q8H PRN, Reubin Milan, MD   zinc sulfate capsule 220 mg, 220 mg, Oral, Daily, Reubin Milan, MD, 220 mg at 05/20/21 1113 Labs CBC    Component Value Date/Time   WBC 2.9 (L) 05/20/2021 1313   RBC 3.23 (L) 05/20/2021 1313   HGB 10.3 (L) 05/20/2021 1313   HGB 12.3 06/15/2020 1223   HCT 31.0 (L) 05/20/2021 1313   PLT 115 (L) 05/20/2021 1313   PLT 271 06/15/2020 1223   MCV 96.0 05/20/2021 1313   MCH 31.9 05/20/2021 1313   MCHC 33.2 05/20/2021 1313   RDW 12.7 05/20/2021 1313   LYMPHSABS 0.4 (L) 05/19/2021 1906   MONOABS 0.6 05/19/2021 1906   EOSABS 0.0 05/19/2021 1906   BASOSABS 0.0 05/19/2021 1906    CMP     Component Value Date/Time   NA 139 05/20/2021 0500   K 4.1 05/20/2021 0500   CL 107 05/20/2021 0500   CO2 23 05/20/2021 0500   GLUCOSE 92 05/20/2021 0500   BUN 15 05/20/2021 0500   CREATININE 1.07 (H) 05/20/2021 0500   CREATININE 0.93 06/15/2020 1223   CALCIUM 8.9 05/20/2021 0500   PROT 6.4 (L) 05/20/2021 0500   ALBUMIN 2.9 (L) 05/20/2021 0500   AST 38 05/20/2021 0500   AST 24 06/15/2020 1223   ALT 15 05/20/2021 0500   ALT 20 06/15/2020 1223   ALKPHOS 44 05/20/2021 0500   BILITOT 0.7 05/20/2021 0500   BILITOT 0.8 06/15/2020 1223   GFRNONAA 54 (L) 05/20/2021 0500   GFRNONAA >60 06/15/2020 1223   GFRAA >60 11/16/2018 1256    glycosylated hemoglobin  Lipid Panel     Component Value Date/Time   CHOL 124 05/20/2021 0500   TRIG 75 05/20/2021 0500   HDL 35 (L) 05/20/2021 0500   CHOLHDL 3.5 05/20/2021 0500   VLDL 15 05/20/2021 0500   LDLCALC 74 05/20/2021 0500   EEG with moderate diffuse encephalopathy-nonspecific.  Imaging I have reviewed images in epic and the results pertinent to this consultation are: CT head without contrast:  Assessment:  75 year old past history of breast cancer diabetes prior large left occipital  stroke with some residual right hemianopsia and vertigo presenting with nausea vomiting and some confusion and slowness of speech seen yesterday because the MRI showed a punctate area of restricted effusion in the left periventricular area. Today, family noticed that the face looked overall more droopy and speech was also far from baseline. On my examination, I did not see a very significant change Given that she recently had the stroke, she is not a candidate for IV thrombolysis Her poor baseline modified Rankin score precludes her from intervention as she is essentially bedridden at baseline Also positive for COVID and has a significant UTI I suspect that her waxing and waning status is likely secondary to toxic metabolic encephalopathy from the above two. If the concern remains for her  not being at her baseline, repeat MRI can be considered  Recommendations: No acute intervention-recent stroke precludes tPA/TNK and poor mRS precludes IR Consider MRI of the brain tomorrow if still remains off baseline Consider repeat EEG as well Continue treating COVID and UTI PT OT speech therapy Consider palliative consultation for [place] discussions given decline in functional status that has been ongoing for a while  Stroke neurology will follow with you.  -- Amie Portland, MD Neurologist [facility] Pager: [place] Performed by: Amie Portland, MD Total critical care time: 44 minutes Critical care time was exclusive of separately billable procedures and treating other patients and/or supervising APPs/Residents/Students Critical care was necessary to treat or prevent imminent or life-threatening deterioration due to strokelike symptoms This patient is critically ill and at significant risk for neurological worsening and/or death and care requires constant monitoring. Critical care was time spent personally by me on the following activities: development of  treatment plan with patient and/or surrogate as well as nursing, discussions with consultants, evaluation of patient's response to treatment, examination of patient, obtaining history from patient or surrogate, ordering and performing treatments and interventions, ordering and review of laboratory studies, ordering and review of radiographic studies, pulse oximetry, re-evaluation of patient's condition, participation in multidisciplinary rounds and medical decision making of high complexity in the care of this patient.

## 2021-05-20 NOTE — H&P (Deleted)
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  Patient was able to answer simple questions. Patient seen in the ED,temperature 98.2 F, pulse 98, respiration 16, BP 130/83 mmHg O2 sat 94% on room air.  Blood work normal troponin lipase stable CBC, covid+ Cxr-did not show any active disease.  CT head without contrast did not show any acute intracranial process.  There were stable chronic ischemic changes throughout the white matter and left occipital lobe.  MRI brain with without contrast showed possible punctate acute or subacute infarct in the left periventricular parietotemporal region.  There are remote left temporo-occipital and right cerebellar infarcts with moderate to outlines chronic microvascular ischemic change in disease. Neurology was consulted and patient admitted-for acute encephalopathy, subacute stroke  Subjective: Seen/examined On RA spo2 at 86%- Informed RN to place on [place] Patient is alert awake oriented to self place date of birth but thinks Anna Hunt is the president mildly confused  Assessment & Plan:  Acute metabolic encephalopathy: Patient has COVID +,UDS negative CT head/MRI brain with subacute stroke.  No fever or leukocytosis, checked urine to rule out UTI UA with pyuria WBC more than 50, positive nitrate and leukocytes suspect she has UTI, suspect encephalopathy due to her UTI as well as COVID-positive  b12, tsh normal.  EEG shows mild to  moderate diffuse encephalopathy.  Follow-up TSH, Depakote and ammonia level.  Keep on delirium precaution.  Subacute stroke: LDL stable at 74, follow-up HbA1c, Neurology following, complete stroke work-up  as belwo per neuro: - Frequent neuro checks - Echocardiogram/CTA head/neck-  -Continue Plavix, simvastatin  as per neurology.  - Risk factor modification - Telemetry monitoring; and cardiac monitor - Blood pressure goal   - Normotension  - Management of hypertensive emergency/urgency per standard protocols  - Permissive hypertension to 220/120 .  cont PT consult, OT Consult.   Acute hypoxic respiratory failure likely from COVID  COVID-positive - chest x-ray clear, D-dimer /CRP.  Keep on supplemental nasal cannula multivitamins and zinc ascorbic acid along with Decadron-since she is hypoxic, continue remdesivir pharmacy to dose.  Continue supplemental oxygen.  Continue airborne/contact precaution  Recent Labs    05/20/21 0500  DDIMER 0.29  CRP <0.5   UTI start on antibiotics-Rocephin, send urine culture.  Previously had pansensitive E. coli UTI in February 2022.  HTN: BP stable.  Holding meds.  Malignant neoplasm of upper-inner quadrant of left breast in female, estrogen receptor positive: Continue her anastrozole.  Type 2 diabetes mellitus with hyperglycemia.  Check hemoglobin A1c, continue sliding scale insulin.  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   Normocytic anemia: Monitor hemoglobin.  History of depression: Mood is stable resume her multiple home meds  Hyperlipidemia: Stable lipid panel, continue simvastatin  Diet Order             Diet NPO time specified  Diet effective now  Patient's There is no height or weight on file to calculate BMI. DVT prophylaxis: enoxaparin (LOVENOX) injection 40 mg Start: 05/19/21 2300 Code  Status:   Code Status: Full Code  Family Communication: plan of care discussed with patient at bedside. Status is: admitted as Observation Remains hospitalized for ongoing management of encephalopathy stroke work-up and COVID infection Dispo: The patient is from: ALF              Anticipated d/c is to: SNF              Patient currently is not medically stable to d/c.   Difficult to place patient No  Unresulted Labs (From admission, onward)     Start     Ordered   05/26/21 0500  Creatinine, serum  (enoxaparin (LOVENOX)    CrCl >/= 30 ml/min)  Weekly,   R     Comments: while on enoxaparin therapy    05/19/21 2105   05/20/21 0700  CBC  Once,   R        05/20/21 0700   05/20/21 0500  Hemoglobin A1c  (Labs)  Tomorrow morning,   R        05/19/21 2105   05/20/21 0500  D-dimer, quantitative  Daily,   R      05/19/21 2325   05/20/21 0500  C-reactive protein  Daily,   R      05/19/21 2325   05/20/21 0500  Comprehensive metabolic panel  Daily,   R      05/19/21 2325   05/20/21 0500  CBC with Differential/Platelet  Daily,   R      05/19/21 2325   05/20/21 0500  Magnesium  Daily,   R      05/19/21 2325   05/20/21 0500  Phosphorus  Daily,   R      05/19/21 2325   05/19/21 2325  Procalcitonin  Add-on,   AD        05/19/21 2325   05/19/21 2325  Ferritin  Add-on,   AD        05/19/21 2325   05/19/21 2325  D-dimer, quantitative  Add-on,   AD        05/19/21 2325   05/19/21 2324  C-reactive protein  Add-on,   AD        05/19/21 2325           Medications reviewed:  Scheduled Meds:   stroke: mapping our early stages of recovery book   Does not apply Once   albuterol  2 puff Inhalation Q6H   anastrozole  1 mg Oral Daily   vitamin C  500 mg Oral Daily   clopidogrel  75 mg Oral Daily   dexamethasone  6 mg Oral Q24H   divalproex  250 mg Oral Daily   And   divalproex  500 mg Oral QHS   enoxaparin (LOVENOX) injection  40 mg Subcutaneous Q24H   insulin aspart  0-15 Units Subcutaneous  TID WC   levothyroxine  25 mcg Oral q morning   metFORMIN  500 mg Oral QHS   PARoxetine  20 mg Oral QHS   simvastatin  40 mg Oral QHS   zinc sulfate  220 mg Oral Daily   Continuous Infusions:  remdesivir 100 mg in NS 100 mL     Consultants:see note  Procedures:see note Antimicrobials: Anti-infectives (From admission, onward)    Start     Dose/Rate Route Frequency Ordered Stop   05/20/21 1000  remdesivir 100 mg in sodium chloride 0.9 % 100 mL IVPB       See Hyperspace for full Linked Orders Report.   100 mg 200 mL/hr over 30 Minutes Intravenous Daily 05/19/21 2325 05/24/21 0959   05/19/21 2359  remdesivir 200 mg in sodium chloride 0.9% 250 mL IVPB       See Hyperspace for full Linked Orders Report.   200 mg 580 mL/hr over 30 Minutes Intravenous Once 05/19/21 2325 05/20/21 0108      Culture/Microbiology    Component Value Date/Time   SDES URINE, RANDOM 11/02/2020 0130   SPECREQUEST  11/02/2020 0130    NONE Performed at [facility] [address] [place], [place] [address]    CULT >=100,000 COLONIES/mL ESCHERICHIA COLI (A) 11/02/2020 0130   REPTSTATUS 11/04/2020 FINAL 11/02/2020 0130    Other culture-see note  Objective: Vitals: Today's Vitals   05/20/21 0349 05/20/21 0425 05/20/21 0615 05/20/21 0900  BP:  108/60 108/70 136/62  Pulse:  80 88 98  Resp:  17 (!) 24 (!) 29  Temp:      TempSrc:      SpO2:  98% 93% (!) 89%  PainSc: 0-No pain       Intake/Output Summary (Last 24 hours) at 05/20/2021 1008 Last data filed at 05/20/2021 0108 Gross per 24 hour  Intake 1282.73 ml  Output --  Net 1282.73 ml   There were no vitals filed for this visit. Weight change:   Intake/Output from previous day: 09/09 0701 - 09/10 0700 In: 1282.7 [IV Piggyback:1282.7] Out: -  Intake/Output this shift: No intake/output data recorded. There were no vitals filed for this visit.  Examination: General exam: AAO x2, ill looking, frail, older than stated age, weak  appearing. HEENT:Oral mucosa moist, Ear/Nose WNL grossly,dentition normal. Respiratory system: bilaterally diminished, no use of accessory muscle, non tender. Cardiovascular system: S1 & S2 +,No JVD. Gastrointestinal system: Abdomen soft, NT,ND, BS+. Nervous System:Alert, awake, moving upper extremities well minimal movement in the lower extremities Extremities: no edema, distal peripheral pulses palpable.  Skin: No rashes,no icterus. MSK: Normal muscle bulk,tone, power  Data Reviewed: I have personally reviewed following labs and imaging studies CBC: Recent Labs  Lab 05/19/21 1906 05/19/21 1920  WBC 5.0  --   NEUTROABS 4.0  --   HGB 11.4* 11.6*  HCT 34.7* 34.0*  MCV 94.3  --   PLT 171  --    Basic Metabolic Panel: Recent Labs  Lab 05/19/21 1906 05/19/21 1920 05/20/21 0500  NA 135 138 139  K 4.1 4.2 4.1  CL 99 101 107  CO2 25  --  23  GLUCOSE 139* 145* 92  BUN '16 17 15  '$ CREATININE 1.11* 1.00 1.07*  CALCIUM 9.6  --  8.9  MG  --   --  1.7  PHOS  --   --  3.8   GFR: CrCl cannot be calculated (Unknown ideal weight.). Liver Function Tests: Recent Labs  Lab 05/19/21 1906 05/20/21 0500  AST 40 38  ALT 16 15  ALKPHOS 55 44  BILITOT 0.7 0.7  PROT 7.3 6.4*  ALBUMIN 3.5 2.9*   Recent Labs  Lab 05/19/21 1906  LIPASE 29   Recent Labs  Lab 05/19/21 2116  AMMONIA 23   Coagulation Profile: Recent Labs  Lab 05/19/21 1906  INR 1.0   Cardiac Enzymes: No results for input(s): CKTOTAL, CKMB, CKMBINDEX, TROPONINI in the last 168 hours. BNP (last 3 results) No results for input(s): PROBNP in  the last 8760 hours. HbA1C: No results for input(s): HGBA1C in the last 72 hours. CBG: Recent Labs  Lab 05/20/21 0858  GLUCAP 101*   Lipid Profile: Recent Labs    05/20/21 0500  CHOL 124  HDL 35*  LDLCALC 74  TRIG 75  CHOLHDL 3.5   Thyroid Function Tests: Recent Labs    05/19/21 2116  TSH 1.354   Anemia Panel: Recent Labs    05/19/21 2116  VITAMINB12  544   Sepsis Labs: No results for input(s): PROCALCITON, LATICACIDVEN in the last 168 hours.  Recent Results (from the past 240 hour(s))  Resp Panel by RT-PCR (Flu A&B, Covid) Nasopharyngeal Swab     Status: Abnormal   Collection Time: 05/19/21  8:34 PM   Specimen: Nasopharyngeal Swab; Nasopharyngeal(NP) swabs in vial transport medium  Result Value Ref Range Status   SARS Coronavirus 2 by RT PCR POSITIVE (A) NEGATIVE Final    Comment: RESULT CALLED TO, READ BACK BY AND VERIFIED WITH: RN BRENDA MICHAELSON 05/19/21'@23'$ :20 BY TW (NOTE) SARS-CoV-2 target nucleic acids are DETECTED.  The SARS-CoV-2 RNA is generally detectable in upper respiratory specimens during the acute phase of infection. Positive results are indicative of the presence of the identified virus, but do not rule out bacterial infection or co-infection with other pathogens not detected by the test. Clinical correlation with patient history and other diagnostic information is necessary to determine patient infection status. The expected result is Negative.  Fact Sheet for Patients: EntrepreneurPulse.com.au  Fact Sheet for Healthcare Providers: IncredibleEmployment.be  This test is not yet approved or cleared by the [place] FDA and  has been authorized for detection and/or diagnosis of SARS-CoV-2 by FDA under an Emergency Use Authorization (EUA).  This EUA will remain in effect (meaning this test  can be used) for the duration of  the COVID-19 declaration under Section 564(b)(1) of the Act, 21 U.S.C. section 360bbb-3(b)(1), unless the authorization is terminated or revoked sooner.     Influenza A by PCR NEGATIVE NEGATIVE Final   Influenza B by PCR NEGATIVE NEGATIVE Final    Comment: (NOTE) The Xpert Xpress SARS-CoV-2/FLU/RSV plus assay is intended as an aid in the diagnosis of influenza from Nasopharyngeal swab specimens and should not be used as a sole basis for treatment.  Nasal washings and aspirates are unacceptable for Xpert Xpress SARS-CoV-2/FLU/RSV testing.  Fact Sheet for Patients: EntrepreneurPulse.com.au  Fact Sheet for Healthcare Providers: IncredibleEmployment.be  This test is not yet approved or cleared by the [place] FDA and has been authorized for detection and/or diagnosis of SARS-CoV-2 by FDA under an Emergency Use Authorization (EUA). This EUA will remain in effect (meaning this test can be used) for the duration of the COVID-19 declaration under Section 564(b)(1) of the Act, 21 U.S.C. section 360bbb-3(b)(1), unless the authorization is terminated or revoked.  Performed at [facility], [place] [address] [place], [place] [address]      Radiology Studies: CT HEAD WO CONTRAST  Result Date: 05/19/2021 CLINICAL DATA:  Neurologic deficit, nausea and vomiting EXAM: CT HEAD WITHOUT CONTRAST TECHNIQUE: Contiguous axial images were obtained from the base of the skull through the vertex without intravenous contrast. COMPARISON:  01/31/2021 FINDINGS: Brain: Stable encephalomalacia in the left occipital region compatible with prior cortical infarct. There are stable hypodensities throughout the periventricular white matter compatible with chronic small vessel ischemic changes. No signs of acute infarct or hemorrhage. The lateral ventricles and remaining midline structures are unremarkable. No acute extra-axial fluid collections. No mass  effect. Vascular: Stable atherosclerosis.  No hyperdense vessel. Skull: Normal. Negative for fracture or focal lesion. Sinuses/Orbits: No acute finding. Other: None. IMPRESSION: 1. No acute intracranial process. 2. Stable chronic ischemic changes throughout the white matter and left occipital lobe. Electronically Signed   By: Randa Ngo M.D.   On: 05/19/2021 19:05   MR BRAIN WO CONTRAST  Result Date: 05/19/2021 CLINICAL DATA:  Transient ischemic attack (TIA) EXAM: MRI HEAD  WITHOUT CONTRAST TECHNIQUE: Multiplanar, multiecho pulse sequences of the brain and surrounding structures were obtained without intravenous contrast. COMPARISON:  Same-day CT head.  MRI June 22 2020. FINDINGS: Brain: Punctate focus of DWI hyperintensity without definite ADC correlate in the left periventricular parietotemporal region (series 5, image 72). Slight T2 hyperintensity in this region. Remote left temporooccipital infarct with encephalomalacia. Remote small right cerebellar lacunar infarcts. Moderate to advanced patchy and confluent T2 hyperintensity within the white matter, nonspecific but compatible with chronic microvascular ischemic disease. Moderate atrophy with ex vacuo ventricular dilation. No acute hemorrhage, hydrocephalus, mass lesion, midline shift or extra-axial fluid collection. Vascular: Major arterial flow voids are maintained at the skull base. Skull and upper cervical spine: Normal marrow signal. Sinuses/Orbits: Clear sinuses.  No acute orbital findings. Other: No sizable mastoid effusions. IMPRESSION: 1. Possible punctate acute or subacute infarct in the left periventricular parietotemporal region. 2. Remote left temporooccipital and right cerebellar infarcts with moderate to advanced chronic microvascular ischemic disease. Electronically Signed   By: Margaretha Sheffield M.D.   On: 05/19/2021 20:17   DG Chest Port 1 View  Result Date: 05/19/2021 CLINICAL DATA:  Weakness EXAM: PORTABLE CHEST 1 VIEW COMPARISON:  06/01/2007 FINDINGS: The heart size and mediastinal contours are within normal limits when accounting for low lung volumes and technique. No focal pulmonary opacity. No pleural effusion or pneumothorax. No acute osseous abnormality. IMPRESSION: No active disease. Electronically Signed   By: Merilyn Baba M.D.   On: 05/19/2021 19:06   EEG adult  Result Date: 05/20/2021 Lora Havens, MD     05/20/2021  8:42 AM Patient Name: Anna Hunt MRN: [id number] Epilepsy  Attending: Lora Havens Referring Physician/Provider: Dr Amie [place] Date: 05/19/2021 Duration: 23.36 mins Patient history:76 year old with past history of breast cancer, diabetes, prior large left occipital stroke with some residual right hemianopsia and vertigo presented with nausea and vomiting and some confusion as well as slowness of speech. EEG to evaluate for seizure Level of alertness: Awake AEDs during EEG study: VPA Technical aspects: This EEG study was done with scalp electrodes positioned according to the 10-20 International system of electrode placement. Electrical activity was acquired at a sampling rate of '500Hz'$  and reviewed with a high frequency filter of '70Hz'$  and a low frequency filter of '1Hz'$ . EEG data were recorded continuously and digitally stored. Description: The posterior dominant rhythm consists of 7.'5Hz'$  activity of moderate voltage (25-35 uV) seen predominantly in posterior head regions, symmetric and reactive to eye opening and eye closing. EEG showed continuous generalized polymorphic predominantly 5 to 7 Hz theta as well as intermittent 2-'3hz'$  delta slowing. Hyperventilation and photic stimulation were not performed.   ABNORMALITY - Continuous slow, generalized IMPRESSION: This study is suggestive of mild to moderate diffuse encephalopathy, nonspecific etiology. No seizures or epileptiform discharges were seen throughout the recording. Priyanka Barbra Sarks     LOS: 0 days   Antonieta Pert, MD [facility]  05/20/2021, 10:08 AM

## 2021-05-21 DIAGNOSIS — Z7189 Other specified counseling: Secondary | ICD-10-CM

## 2021-05-21 DIAGNOSIS — F0390 Unspecified dementia without behavioral disturbance: Secondary | ICD-10-CM

## 2021-05-21 DIAGNOSIS — Z515 Encounter for palliative care: Secondary | ICD-10-CM

## 2021-05-21 LAB — CBC WITH DIFFERENTIAL/PLATELET
Abs Immature Granulocytes: 0.01 10*3/uL (ref 0.00–0.07)
Basophils Absolute: 0 10*3/uL (ref 0.0–0.1)
Basophils Relative: 1 %
Eosinophils Absolute: 0 10*3/uL (ref 0.0–0.5)
Eosinophils Relative: 0 %
HCT: 29.9 % — ABNORMAL LOW (ref 36.0–46.0)
Hemoglobin: 9.9 g/dL — ABNORMAL LOW (ref 12.0–15.0)
Immature Granulocytes: 1 %
Lymphocytes Relative: 27 %
Lymphs Abs: 0.6 10*3/uL — ABNORMAL LOW (ref 0.7–4.0)
MCH: 31.6 pg (ref 26.0–34.0)
MCHC: 33.1 g/dL (ref 30.0–36.0)
MCV: 95.5 fL (ref 80.0–100.0)
Monocytes Absolute: 0.3 10*3/uL (ref 0.1–1.0)
Monocytes Relative: 13 %
Neutro Abs: 1.3 10*3/uL — ABNORMAL LOW (ref 1.7–7.7)
Neutrophils Relative %: 58 %
Platelets: 101 10*3/uL — ABNORMAL LOW (ref 150–400)
RBC: 3.13 MIL/uL — ABNORMAL LOW (ref 3.87–5.11)
RDW: 12.9 % (ref 11.5–15.5)
WBC: 2.1 10*3/uL — ABNORMAL LOW (ref 4.0–10.5)
nRBC: 0 % (ref 0.0–0.2)

## 2021-05-21 LAB — COMPREHENSIVE METABOLIC PANEL
ALT: 28 U/L (ref 0–44)
AST: 66 U/L — ABNORMAL HIGH (ref 15–41)
Albumin: 2.8 g/dL — ABNORMAL LOW (ref 3.5–5.0)
Alkaline Phosphatase: 39 U/L (ref 38–126)
Anion gap: 8 (ref 5–15)
BUN: 16 mg/dL (ref 8–23)
CO2: 26 mmol/L (ref 22–32)
Calcium: 8.8 mg/dL — ABNORMAL LOW (ref 8.9–10.3)
Chloride: 104 mmol/L (ref 98–111)
Creatinine, Ser: 1.02 mg/dL — ABNORMAL HIGH (ref 0.44–1.00)
GFR, Estimated: 57 mL/min — ABNORMAL LOW (ref >60)
Glucose, Bld: 113 mg/dL — ABNORMAL HIGH (ref 70–99)
Potassium: 3.9 mmol/L (ref 3.5–5.1)
Sodium: 138 mmol/L (ref 135–145)
Total Bilirubin: 0.4 mg/dL (ref 0.3–1.2)
Total Protein: 6.2 g/dL — ABNORMAL LOW (ref 6.5–8.1)

## 2021-05-21 LAB — MAGNESIUM: Magnesium: 1.7 mg/dL (ref 1.7–2.4)

## 2021-05-21 LAB — GLUCOSE, CAPILLARY
Glucose-Capillary: 90 mg/dL (ref 70–99)
Glucose-Capillary: 88 mg/dL (ref 70–99)
Glucose-Capillary: 146 mg/dL — ABNORMAL HIGH (ref 70–99)
Glucose-Capillary: 222 mg/dL — ABNORMAL HIGH (ref 70–99)

## 2021-05-21 LAB — D-DIMER, QUANTITATIVE: D-Dimer, Quant: 0.72 ug/mL-FEU — ABNORMAL HIGH (ref 0.00–0.50)

## 2021-05-21 LAB — PHOSPHORUS: Phosphorus: 4.2 mg/dL (ref 2.5–4.6)

## 2021-05-21 LAB — C-REACTIVE PROTEIN: CRP: <0.5 mg/dL (ref <1.0)

## 2021-05-21 MED ORDER — ASPIRIN EC 81 MG PO TBEC
81.0000 mg | DELAYED_RELEASE_TABLET | Freq: Every day | ORAL | Status: DC
  Administered 2021-05-21 – 2021-05-23 (×3): 81 mg via ORAL
  Filled 2021-05-21 (×3): qty 1

## 2021-05-21 MED ORDER — ALBUTEROL SULFATE HFA 108 (90 BASE) MCG/ACT IN AERS
2.0000 | INHALATION_SPRAY | Freq: Four times a day (QID) | RESPIRATORY_TRACT | Status: DC | PRN
  Filled 2021-05-21: qty 6.7

## 2021-05-21 MED ORDER — DIVALPROEX SODIUM ER 500 MG PO TB24
500.0000 mg | ORAL_TABLET | Freq: Every day | ORAL | Status: DC
  Administered 2021-05-22 – 2021-05-23 (×2): 500 mg via ORAL
  Filled 2021-05-21 (×2): qty 1

## 2021-05-21 MED ORDER — DIVALPROEX SODIUM ER 500 MG PO TB24
500.0000 mg | ORAL_TABLET | Freq: Every day | ORAL | Status: DC
  Administered 2021-05-21 – 2021-05-23 (×3): 500 mg via ORAL
  Filled 2021-05-21 (×3): qty 1

## 2021-05-21 MED ORDER — VALPROATE SODIUM 100 MG/ML IV SOLN
500.0000 mg | INTRAVENOUS | Status: AC
  Administered 2021-05-21: 500 mg via INTRAVENOUS
  Filled 2021-05-21: qty 5

## 2021-05-21 NOTE — Progress Notes (Signed)
STROKE           Anna Hunt is an 75 y.o. female.   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  The dose of Depakote will be increased.  A bolus dose of Depacon will be done. Episode of nausea vomiting of unclear etiology. Punctate infarcts seen unlikely to explain the etiology: Imaging results are reviewed in person and discussed with the family at the bedside.  Dual antiplatelet agents are recommended for 3 weeks and then the patient can revert back to Plavix 75 mg. 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 COVID-19 infection currently being treated with remdesivir UTI     The patient had an event of unresponsiveness and confusion overnight.  The daughter witnessed the event.  The patient had difficulties responding and difficulty with expression her speech.  The event lasted for about 20 minutes.  Repeat imaging showed no recurrent infarct on CT scan.  She is back to baseline.     Objective: Vital signs in last 24 hours: Temp:  [98.7 F (37.1 C)-102.2 F (39 C)] 99.3 F (37.4 C) (09/11 0819) Pulse Rate:  [62-103] 68 (09/11 0819) Resp:  [16-27] 18 (09/11 0819) BP: (106-137)/(57-71) 109/67 (09/11 0819) SpO2:  [90 %-100 %] 100 % (09/11 0819)   GENERAL: She is doing well at this time.  HEENT: Normal  ABDOMEN: soft  EXTREMITIES: No edema   BACK: Normal  SKIN: Normal by inspection.    MENTAL STATUS: She is awake and cooperate with the evaluation.  Spontaneous speech is significantly reduced but she is otherwise lucid.  Speech is mildly hypophonic but appears to be at baseline.  She follows commands briskly.  CRANIAL NERVES: Pupils are equal, round and reactive to light and  accomodation; extra ocular movements are full, there is no significant nystagmus; visual fields are full; upper and lower facial muscles are normal in strength and symmetric, there is no flattening of the nasolabial folds; tongue is midline; uvula is midline; shoulder elevation is normal.  MOTOR: Normal tone, bulk and strength; no pronator drift.  COORDINATION: Left finger to nose is normal, right finger to nose is normal, No rest tremor; no intention tremor; no postural tremor; no bradykinesia.  SENSATION: Normal to light touch, temperature, and pain.      Intake/Output from previous day: 09/10 0701 - 09/11 0700 In: 440 [P.O.:240; IV Piggyback:200] Out: 625 [Urine:625] Intake/Output this shift: No intake/output data recorded. Nutritional status:  Diet Order             Diet Carb Modified Fluid consistency: Thin; Room service appropriate? Yes  Diet effective now                    Lab Results: Results for orders placed or performed during the hospital encounter of 05/19/21 (from the past 48 hour(s))  Ethanol     Status: None   Collection Time: 05/19/21  7:06 PM  Result Value Ref Range   Alcohol, Ethyl (B) <10 <10 mg/dL    Comment: (NOTE) Lowest detectable limit for serum alcohol is 10 mg/dL.  For medical purposes only. Performed at [facility], [place] [address], [place], [place] [address]   Protime-INR     Status: None   Collection Time: 05/19/21  7:06 PM  Result Value Ref Range   Prothrombin Time 13.6 11.4 - 15.2 seconds   INR 1.0 0.8 - 1.2    Comment: (NOTE) INR goal varies based on device and disease states. Performed at [facility], [place] [address] [place], [place] [address]   APTT     Status: Abnormal   Collection Time: 05/19/21  7:06 PM  Result Value Ref Range   aPTT <20 (L) 24 - 36 seconds    Comment: Performed at [facility] [address] [place], [place] [address]  CBC     Status: Abnormal   Collection Time: 05/19/21  7:06 PM   Result Value Ref Range   WBC 5.0 4.0 - 10.5 K/uL   RBC 3.68 (L) 3.87 - 5.11 MIL/uL   Hemoglobin 11.4 (L) 12.0 - 15.0 g/dL   HCT 34.7 (L) 36.0 - 46.0 %   MCV 94.3 80.0 - 100.0 fL   MCH 31.0 26.0 - 34.0 pg   MCHC 32.9 30.0 - 36.0 g/dL   RDW 12.7 11.5 - 15.5 %   Platelets 171 150 - 400 K/uL   nRBC 0.0 0.0 - 0.2 %    Comment: Performed at [facility], [place] [address] [place], [place] [address]  Differential     Status: Abnormal   Collection Time: 05/19/21  7:06 PM  Result Value Ref Range   Neutrophils Relative % 79 %   Neutro Abs 4.0 1.7 - 7.7 K/uL   Lymphocytes Relative 8 %   Lymphs Abs 0.4 (L) 0.7 - 4.0 K/uL   Monocytes Relative 12 %   Monocytes Absolute 0.6 0.1 - 1.0 K/uL   Eosinophils Relative 0 %   Eosinophils Absolute 0.0 0.0 - 0.5 K/uL   Basophils Relative 1 %   Basophils Absolute 0.0 0.0 - 0.1 K/uL   Immature Granulocytes 0 %   Abs Immature Granulocytes 0.02 0.00 - 0.07 K/uL    Comment: Performed at [facility] [address] [place], [place] [address]  Comprehensive metabolic panel     Status: Abnormal   Collection Time: 05/19/21  7:06 PM  Result Value Ref Range   Sodium 135 135 - 145 mmol/L   Potassium 4.1 3.5 - 5.1 mmol/L   Chloride 99 98 - 111 mmol/L   CO2 25 22 - 32 mmol/L   Glucose, Bld 139 (H) 70 - 99 mg/dL    Comment: Glucose reference range applies only to samples taken after fasting for at least 8 hours.   BUN 16 8 - 23 mg/dL   Creatinine, Ser 1.11 (H) 0.44 - 1.00 mg/dL   Calcium 9.6 8.9 - 10.3 mg/dL   Total Protein 7.3 6.5 - 8.1 g/dL   Albumin 3.5 3.5 - 5.0 g/dL   AST 40 15 - 41 U/L   ALT 16 0 - 44 U/L   Alkaline Phosphatase 55 38 - 126 U/L   Total Bilirubin 0.7 0.3 - 1.2 mg/dL   GFR, Estimated 52 (L) >60 mL/min    Comment: (NOTE) Calculated using the CKD-EPI Creatinine Equation (2021)    Anion gap 11 5 - 15    Comment: Performed at [facility] [address], [place], [place] [address]  Troponin I (High Sensitivity)      Status: None   Collection Time: 05/19/21  7:06 PM  Result Value Ref Range   Troponin I (High Sensitivity) 5 <18 ng/L    Comment: (NOTE) Elevated high sensitivity  troponin I (hsTnI) values and significant  changes across serial measurements may suggest ACS but many other  chronic and acute conditions are known to elevate hsTnI results.  Refer to the "Links" section for chest pain algorithms and additional  guidance. Performed at [facility], [place] [address], [place], [place] [address]   Lipase, blood     Status: None   Collection Time: 05/19/21  7:06 PM  Result Value Ref Range   Lipase 29 11 - 51 U/L    Comment: Performed at [facility] [address], [place], [place] [address]  I-stat chem 8, ED     Status: Abnormal   Collection Time: 05/19/21  7:20 PM  Result Value Ref Range   Sodium 138 135 - 145 mmol/L   Potassium 4.2 3.5 - 5.1 mmol/L   Chloride 101 98 - 111 mmol/L   BUN 17 8 - 23 mg/dL   Creatinine, Ser 1.00 0.44 - 1.00 mg/dL   Glucose, Bld 145 (H) 70 - 99 mg/dL    Comment: Glucose reference range applies only to samples taken after fasting for at least 8 hours.   Calcium, Ion 1.16 1.15 - 1.40 mmol/L   TCO2 28 22 - 32 mmol/L   Hemoglobin 11.6 (L) 12.0 - 15.0 g/dL   HCT 34.0 (L) 36.0 - 46.0 %  Resp Panel by RT-PCR (Flu A&B, Covid) Nasopharyngeal Swab     Status: Abnormal   Collection Time: 05/19/21  8:34 PM   Specimen: Nasopharyngeal Swab; Nasopharyngeal(NP) swabs in vial transport medium  Result Value Ref Range   SARS Coronavirus 2 by RT PCR POSITIVE (A) NEGATIVE    Comment: RESULT CALLED TO, READ BACK BY AND VERIFIED WITH: RN BRENDA MICHAELSON 05/19/21'@23'$ :20 BY TW (NOTE) SARS-CoV-2 target nucleic acids are DETECTED.  The SARS-CoV-2 RNA is generally detectable in upper respiratory specimens during the acute phase of infection. Positive results are indicative of the presence of the identified virus, but do not rule out bacterial infection or co-infection  with other pathogens not detected by the test. Clinical correlation with patient history and other diagnostic information is necessary to determine patient infection status. The expected result is Negative.  Fact Sheet for Patients: EntrepreneurPulse.com.au  Fact Sheet for Healthcare Providers: IncredibleEmployment.be  This test is not yet approved or cleared by the [place] FDA and  has been authorized for detection and/or diagnosis of SARS-CoV-2 by FDA under an Emergency Use Authorization (EUA).  This EUA will remain in effect (meaning this test  can be used) for the duration of  the COVID-19 declaration under Section 564(b)(1) of the Act, 21 U.S.C. section 360bbb-3(b)(1), unless the authorization is terminated or revoked sooner.     Influenza A by PCR NEGATIVE NEGATIVE   Influenza B by PCR NEGATIVE NEGATIVE    Comment: (NOTE) The Xpert Xpress SARS-CoV-2/FLU/RSV plus assay is intended as an aid in the diagnosis of influenza from Nasopharyngeal swab specimens and should not be used as a sole basis for treatment. Nasal washings and aspirates are unacceptable for Xpert Xpress SARS-CoV-2/FLU/RSV testing.  Fact Sheet for Patients: EntrepreneurPulse.com.au  Fact Sheet for Healthcare Providers: IncredibleEmployment.be  This test is not yet approved or cleared by the [place] FDA and has been authorized for detection and/or diagnosis of SARS-CoV-2 by FDA under an Emergency Use Authorization (EUA). This EUA will remain in effect (meaning this test can be used) for the duration of the COVID-19 declaration under Section 564(b)(1) of the Act, 21 U.S.C. section  360bbb-3(b)(1), unless the authorization is terminated or revoked.  Performed at [facility], [place] [address], [place], [place] [address]   Troponin I (High Sensitivity)     Status: None   Collection Time: 05/19/21  9:00 PM  Result Value  Ref Range   Troponin I (High Sensitivity) 8 <18 ng/L    Comment: (NOTE) Elevated high sensitivity troponin I (hsTnI) values and significant  changes across serial measurements may suggest ACS but many other  chronic and acute conditions are known to elevate hsTnI results.  Refer to the "Links" section for chest pain algorithms and additional  guidance. Performed at [facility], [place] [address] [place], [place] [address]   Valproic acid level     Status: Abnormal   Collection Time: 05/19/21  9:16 PM  Result Value Ref Range   Valproic Acid Lvl 37 (L) 50.0 - 100.0 ug/mL    Comment: Performed at [facility] [address] [place], [place] [address]  Ammonia     Status: None   Collection Time: 05/19/21  9:16 PM  Result Value Ref Range   Ammonia 23 9 - 35 umol/L    Comment: Performed at [facility], [place] [address] [place], [place] [address]  Vitamin B12     Status: None   Collection Time: 05/19/21  9:16 PM  Result Value Ref Range   Vitamin B-12 544 180 - 914 pg/mL    Comment: (NOTE) This assay is not validated for testing neonatal or myeloproliferative syndrome specimens for Vitamin B12 levels. Performed at [facility], [place] [address], [place], [place] [address]   TSH     Status: None   Collection Time: 05/19/21  9:16 PM  Result Value Ref Range   TSH 1.354 0.350 - 4.500 uIU/mL    Comment: Performed by a 3rd Generation assay with a functional sensitivity of <=0.01 uIU/mL. Performed at [facility], [place] [address], [place], [place] [address]   Urine rapid drug screen (hosp performed)     Status: None   Collection Time: 05/19/21  9:52 PM  Result Value Ref Range   Opiates NONE DETECTED NONE DETECTED   Cocaine NONE DETECTED NONE DETECTED   Benzodiazepines NONE DETECTED NONE DETECTED   Amphetamines NONE DETECTED NONE DETECTED   Tetrahydrocannabinol NONE DETECTED NONE DETECTED   Barbiturates NONE DETECTED NONE DETECTED    Comment: (NOTE) DRUG  SCREEN FOR MEDICAL PURPOSES ONLY.  IF CONFIRMATION IS NEEDED FOR ANY PURPOSE, NOTIFY LAB WITHIN 5 DAYS.  LOWEST DETECTABLE LIMITS FOR URINE DRUG SCREEN Drug Class                     Cutoff (ng/mL) Amphetamine and metabolites    1000 Barbiturate and metabolites    200 Benzodiazepine                 [id number] Tricyclics and metabolites     300 Opiates and metabolites        300 Cocaine and metabolites        300 THC                            50 Performed at [facility], [place] [address] [place],  [address]   Lipid panel     Status: Abnormal   Collection Time: 05/20/21  5:00 AM  Result Value Ref Range   Cholesterol 124  0 - 200 mg/dL   Triglycerides 75 <150 mg/dL   HDL 35 (L) >40 mg/dL   Total CHOL/HDL Ratio 3.5 RATIO   VLDL 15 0 - 40 mg/dL   LDL Cholesterol 74 0 - 99 mg/dL    Comment:        Total Cholesterol/HDL:CHD Risk Coronary Heart Disease Risk Table                     Men   Women  1/2 Average Risk   3.4   3.3  Average Risk       5.0   4.4  2 X Average Risk   9.6   7.1  3 X Average Risk  23.4   11.0        Use the calculated Patient Ratio above and the CHD Risk Table to determine the patient's CHD Risk.        ATP III CLASSIFICATION (LDL):  <100     mg/dL   Optimal  100-129  mg/dL   Near or Above                    Optimal  130-159  mg/dL   Borderline  160-189  mg/dL   High  >190     mg/dL   Very High Performed at [facility] [address], [place], [place] [address]   D-dimer, quantitative     Status: None   Collection Time: 05/20/21  5:00 AM  Result Value Ref Range   D-Dimer, Quant 0.29 0.00 - 0.50 ug/mL-FEU    Comment: (NOTE) At the manufacturer cut-off value of 0.5 g/mL FEU, this assay has a negative predictive value of 95-100%.This assay is intended for use in conjunction with a clinical pretest probability (PTP) assessment model to exclude pulmonary embolism (PE) and deep venous thrombosis (DVT) in outpatients suspected of PE or  DVT. Results should be correlated with clinical presentation. Performed at [facility], [place] [address] [place], [place] [address]   C-reactive protein     Status: None   Collection Time: 05/20/21  5:00 AM  Result Value Ref Range   CRP <0.5 <1.0 mg/dL    Comment: Performed at [facility], [place] [address] [place], [place] [address]  Comprehensive metabolic panel     Status: Abnormal   Collection Time: 05/20/21  5:00 AM  Result Value Ref Range   Sodium 139 135 - 145 mmol/L   Potassium 4.1 3.5 - 5.1 mmol/L   Chloride 107 98 - 111 mmol/L   CO2 23 22 - 32 mmol/L   Glucose, Bld 92 70 - 99 mg/dL    Comment: Glucose reference range applies only to samples taken after fasting for at least 8 hours.   BUN 15 8 - 23 mg/dL   Creatinine, Ser 1.07 (H) 0.44 - 1.00 mg/dL   Calcium 8.9 8.9 - 10.3 mg/dL   Total Protein 6.4 (L) 6.5 - 8.1 g/dL   Albumin 2.9 (L) 3.5 - 5.0 g/dL   AST 38 15 - 41 U/L   ALT 15 0 - 44 U/L   Alkaline Phosphatase 44 38 - 126 U/L   Total Bilirubin 0.7 0.3 - 1.2 mg/dL   GFR, Estimated 54 (L) >60 mL/min    Comment: (NOTE) Calculated using the CKD-EPI Creatinine Equation (2021)    Anion gap 9 5 - 15    Comment: Performed at [facility], [place],  [place] [address]  Magnesium     Status: None   Collection Time: 05/20/21  5:00 AM  Result Value Ref Range   Magnesium 1.7 1.7 - 2.4 mg/dL    Comment: Performed at [facility], [place] [address], [place], [place] [address]  Phosphorus     Status: None   Collection Time: 05/20/21  5:00 AM  Result Value Ref Range   Phosphorus 3.8 2.5 - 4.6 mg/dL    Comment: Performed at [facility] [address] [place], [place] [address]  Urinalysis, Routine w reflex microscopic Urine, Clean Catch     Status: Abnormal   Collection Time: 05/20/21  7:48 AM  Result Value Ref Range   Color, Urine YELLOW YELLOW   APPearance CLEAR CLEAR   Specific Gravity, Urine 1.025 1.005 - 1.030   pH 6.0 5.0 -  8.0   Glucose, UA NEGATIVE NEGATIVE mg/dL   Hgb urine dipstick TRACE (A) NEGATIVE   Bilirubin Urine NEGATIVE NEGATIVE   Ketones, ur NEGATIVE NEGATIVE mg/dL   Protein, ur NEGATIVE NEGATIVE mg/dL   Nitrite POSITIVE (A) NEGATIVE   Leukocytes,Ua MODERATE (A) NEGATIVE    Comment: Performed at [facility], [place] [address], [place], [place] [address]  Urinalysis, Microscopic (reflex)     Status: Abnormal   Collection Time: 05/20/21  7:48 AM  Result Value Ref Range   RBC / HPF 6-10 0 - 5 RBC/hpf   WBC, UA >50 0 - 5 WBC/hpf   Bacteria, UA RARE (A) NONE SEEN   Squamous Epithelial / LPF NONE SEEN 0 - 5    Comment: Performed at [facility], [place] [address] [place], [place] [address]  CBG monitoring, ED     Status: Abnormal   Collection Time: 05/20/21  8:58 AM  Result Value Ref Range   Glucose-Capillary 101 (H) 70 - 99 mg/dL    Comment: Glucose reference range applies only to samples taken after fasting for at least 8 hours.  Urine Culture     Status: Abnormal (Preliminary result)   Collection Time: 05/20/21 10:17 AM   Specimen: Urine, Clean Catch  Result Value Ref Range   Specimen Description URINE, CLEAN CATCH    Special Requests NONE    Culture (A)     >=100,000 COLONIES/mL ESCHERICHIA COLI SUSCEPTIBILITIES TO FOLLOW Performed at [facility], [place] [address] [place], [place] [address]    Report Status PENDING   CBG monitoring, ED     Status: Abnormal   Collection Time: 05/20/21 11:59 AM  Result Value Ref Range   Glucose-Capillary 120 (H) 70 - 99 mg/dL    Comment: Glucose reference range applies only to samples taken after fasting for at least 8 hours.  Hemoglobin A1c     Status: None   Collection Time: 05/20/21  1:13 PM  Result Value Ref Range   Hgb A1c MFr Bld 5.3 4.8 - 5.6 %    Comment: (NOTE) Pre diabetes:          5.7%-6.4%  Diabetes:              >6.4%  Glycemic control for   <7.0% adults with diabetes    Mean Plasma Glucose 105.41 mg/dL    Comment:  Performed at [facility] [address], [place], [place] [address]  C-reactive protein     Status: None   Collection Time: 05/20/21  1:13 PM  Result Value Ref Range   CRP 0.5 <1.0  mg/dL    Comment: Performed at [facility], [place] [address], [place], [place] [address]  Procalcitonin     Status: None   Collection Time: 05/20/21  1:13 PM  Result Value Ref Range   Procalcitonin <0.10 ng/mL    Comment:        Interpretation: PCT (Procalcitonin) <= 0.5 ng/mL: Systemic infection (sepsis) is not likely. Local bacterial infection is possible. (NOTE)       Sepsis PCT Algorithm           Lower Respiratory Tract                                      Infection PCT Algorithm    ----------------------------     ----------------------------         PCT < 0.25 ng/mL                PCT < 0.10 ng/mL          Strongly encourage             Strongly discourage   discontinuation of antibiotics    initiation of antibiotics    ----------------------------     -----------------------------       PCT 0.25 - 0.50 ng/mL            PCT 0.10 - 0.25 ng/mL               OR       >80% decrease in PCT            Discourage initiation of                                            antibiotics      Encourage discontinuation           of antibiotics    ----------------------------     -----------------------------         PCT >= 0.50 ng/mL              PCT 0.26 - 0.50 ng/mL               AND        <80% decrease in PCT             Encourage initiation of                                             antibiotics       Encourage continuation           of antibiotics    ----------------------------     -----------------------------        PCT >= 0.50 ng/mL                  PCT > 0.50 ng/mL               AND         increase in PCT                  Strongly encourage  initiation of antibiotics    Strongly encourage escalation           of antibiotics                                      -----------------------------                                           PCT <= 0.25 ng/mL                                                 OR                                        > 80% decrease in PCT                                      Discontinue / Do not initiate                                             antibiotics  Performed at [facility], [address], [place], [place] [address]   Ferritin     Status: None   Collection Time: 05/20/21  1:13 PM  Result Value Ref Range   Ferritin 126 11 - 307 ng/mL    Comment: Performed at [facility], [place] [address], [place], [place] [address]  D-dimer, quantitative     Status: None   Collection Time: 05/20/21  1:13 PM  Result Value Ref Range   D-Dimer, Quant <0.27 0.00 - 0.50 ug/mL-FEU    Comment: (NOTE) At the manufacturer cut-off value of 0.5 g/mL FEU, this assay has a negative predictive value of 95-100%.This assay is intended for use in conjunction with a clinical pretest probability (PTP) assessment model to exclude pulmonary embolism (PE) and deep venous thrombosis (DVT) in outpatients suspected of PE or DVT. Results should be correlated with clinical presentation. Performed at [facility], [place] [address], [place], [place] [address]   CBC     Status: Abnormal   Collection Time: 05/20/21  1:13 PM  Result Value Ref Range   WBC 2.9 (L) 4.0 - 10.5 K/uL   RBC 3.23 (L) 3.87 - 5.11 MIL/uL   Hemoglobin 10.3 (L) 12.0 - 15.0 g/dL   HCT 31.0 (L) 36.0 - 46.0 %   MCV 96.0 80.0 - 100.0 fL   MCH 31.9 26.0 - 34.0 pg   MCHC 33.2 30.0 - 36.0 g/dL   RDW 12.7 11.5 - 15.5 %   Platelets 115 (L) 150 - 400 K/uL    Comment: Immature Platelet Fraction may be clinically indicated, consider ordering this additional test [id number] REPEATED TO VERIFY PLATELET COUNT CONFIRMED BY SMEAR    nRBC 0.0 0.0 - 0.2 %    Comment: Performed at [facility], [place],  [place] [address]  Lactic  acid, plasma     Status: None   Collection Time: 05/20/21  1:13 PM  Result Value Ref Range   Lactic Acid, Venous 1.0 0.5 - 1.9 mmol/L    Comment: Performed at [facility], [place] [address], [place], [place] [address]  Glucose, capillary     Status: Abnormal   Collection Time: 05/20/21  6:53 PM  Result Value Ref Range   Glucose-Capillary 160 (H) 70 - 99 mg/dL    Comment: Glucose reference range applies only to samples taken after fasting for at least 8 hours.  Glucose, capillary     Status: Abnormal   Collection Time: 05/20/21  9:57 PM  Result Value Ref Range   Glucose-Capillary 140 (H) 70 - 99 mg/dL    Comment: Glucose reference range applies only to samples taken after fasting for at least 8 hours.  D-dimer, quantitative     Status: Abnormal   Collection Time: 05/21/21  2:44 AM  Result Value Ref Range   D-Dimer, Quant 0.72 (H) 0.00 - 0.50 ug/mL-FEU    Comment: (NOTE) At the manufacturer cut-off value of 0.5 g/mL FEU, this assay has a negative predictive value of 95-100%.This assay is intended for use in conjunction with a clinical pretest probability (PTP) assessment model to exclude pulmonary embolism (PE) and deep venous thrombosis (DVT) in outpatients suspected of PE or DVT. Results should be correlated with clinical presentation. Performed at [facility], [place] [address], [place], [place] [address]   C-reactive protein     Status: None   Collection Time: 05/21/21  2:44 AM  Result Value Ref Range   CRP <0.5 <1.0 mg/dL    Comment: Performed at [facility], [place] [address], [place], [place] [address]  Comprehensive metabolic panel     Status: Abnormal   Collection Time: 05/21/21  2:44 AM  Result Value Ref Range   Sodium 138 135 - 145 mmol/L   Potassium 3.9 3.5 - 5.1 mmol/L   Chloride 104 98 - 111 mmol/L   CO2 26 22 - 32 mmol/L   Glucose, Bld 113 (H) 70 - 99 mg/dL    Comment: Glucose reference range applies only to samples taken after fasting for at least  8 hours.   BUN 16 8 - 23 mg/dL   Creatinine, Ser 1.02 (H) 0.44 - 1.00 mg/dL   Calcium 8.8 (L) 8.9 - 10.3 mg/dL   Total Protein 6.2 (L) 6.5 - 8.1 g/dL   Albumin 2.8 (L) 3.5 - 5.0 g/dL   AST 66 (H) 15 - 41 U/L   ALT 28 0 - 44 U/L   Alkaline Phosphatase 39 38 - 126 U/L   Total Bilirubin 0.4 0.3 - 1.2 mg/dL   GFR, Estimated 57 (L) >60 mL/min    Comment: (NOTE) Calculated using the CKD-EPI Creatinine Equation (2021)    Anion gap 8 5 - 15    Comment: Performed at [facility], [place] [address], [place] [address]  CBC with Differential/Platelet     Status: Abnormal   Collection Time: 05/21/21  2:44 AM  Result Value Ref Range   WBC 2.1 (L) 4.0 - 10.5 K/uL   RBC 3.13 (L) 3.87 - 5.11 MIL/uL   Hemoglobin 9.9 (L) 12.0 - 15.0 g/dL   HCT 29.9 (L) 36.0 - 46.0 %   MCV 95.5 80.0 - 100.0 fL   MCH 31.6 26.0 - 34.0 pg   MCHC 33.1 30.0 - 36.0  g/dL   RDW 12.9 11.5 - 15.5 %   Platelets 101 (L) 150 - 400 K/uL    Comment: Immature Platelet Fraction may be clinically indicated, consider ordering this additional test [id number] CONSISTENT WITH PREVIOUS RESULT REPEATED TO VERIFY    nRBC 0.0 0.0 - 0.2 %   Neutrophils Relative % 58 %   Neutro Abs 1.3 (L) 1.7 - 7.7 K/uL   Lymphocytes Relative 27 %   Lymphs Abs 0.6 (L) 0.7 - 4.0 K/uL   Monocytes Relative 13 %   Monocytes Absolute 0.3 0.1 - 1.0 K/uL   Eosinophils Relative 0 %   Eosinophils Absolute 0.0 0.0 - 0.5 K/uL   Basophils Relative 1 %   Basophils Absolute 0.0 0.0 - 0.1 K/uL   Immature Granulocytes 1 %   Abs Immature Granulocytes 0.01 0.00 - 0.07 K/uL    Comment: Performed at [facility] [address], [place], [place] [address]  Magnesium     Status: None   Collection Time: 05/21/21  2:44 AM  Result Value Ref Range   Magnesium 1.7 1.7 - 2.4 mg/dL    Comment: Performed at [facility] [address] [place], [place] [address]  Phosphorus     Status: None   Collection Time: 05/21/21  2:44 AM  Result Value Ref  Range   Phosphorus 4.2 2.5 - 4.6 mg/dL    Comment: Performed at [facility] [address], [place], [place] [address]  Glucose, capillary     Status: None   Collection Time: 05/21/21  6:06 AM  Result Value Ref Range   Glucose-Capillary 90 70 - 99 mg/dL    Comment: Glucose reference range applies only to samples taken after fasting for at least 8 hours.    Lipid Panel Recent Labs    05/20/21 0500  CHOL 124  TRIG 75  HDL 35*  CHOLHDL 3.5  VLDL 15  LDLCALC 74     Studies/Results:   Medications:  Scheduled Meds:   stroke: mapping our early stages of recovery book   Does not apply Once   anastrozole  1 mg Oral Daily   vitamin C  500 mg Oral Daily   aspirin EC  81 mg Oral Daily   clopidogrel  75 mg Oral Daily   dexamethasone  6 mg Oral Q24H   divalproex  250 mg Oral Daily   And   divalproex  500 mg Oral QHS   enoxaparin (LOVENOX) injection  40 mg Subcutaneous Q24H   insulin aspart  0-15 Units Subcutaneous TID WC   levothyroxine  25 mcg Oral q morning   PARoxetine  20 mg Oral QHS   simvastatin  40 mg Oral QHS   zinc sulfate  220 mg Oral Daily   Continuous Infusions:  cefTRIAXone (ROCEPHIN)  IV Stopped (05/20/21 1204)   remdesivir 100 mg in NS 100 mL Stopped (05/20/21 1304)   PRN Meds:.acetaminophen **OR** acetaminophen, albuterol, guaiFENesin-dextromethorphan, LORazepam, ondansetron **OR** ondansetron (ZOFRAN) IV, traMADol     LOS: 1 day   Bradely Rudin A. Merlene Laughter, M.D.  Diplomate, Tax adviser of Psychiatry and Neurology ( Neurology).

## 2021-05-21 NOTE — Progress Notes (Signed)
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  Patient was able to answer simple questions. Patient seen in the ED,temperature 98.2 F, pulse 98, respiration 16, BP 130/83 mmHg O2 sat 94% on room air.  Blood work normal troponin lipase stable CBC, covid+ Cxr-did not show any active disease.  CT head without contrast did not show any acute intracranial process.  There were stable chronic ischemic changes throughout the white matter and left occipital lobe.  MRI brain with without contrast showed possible punctate acute or subacute infarct in the left periventricular parietotemporal region.  There are remote left temporo-occipital and right cerebellar infarcts with moderate to outlines chronic microvascular ischemic change in disease. Neurology was consulted and patient admitted-for acute encephalopathy, subacute stroke.  Patient had fever spike in the ED and UA consistent with UTI, and also hypoxic needing supplemental oxygen for COVID 9/10 pm-rapid response called/code stroke due to increased facial droop and or difficulty seen by neurology-stat CT head stable, no intervention done  Subjective: Seen this morning.  Patient appears more pleasant alert awake oriented to self, current place, current president. Has no new complaints. 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 UDS negative CT head/MRI brain with subacute stroke. b12, tsh normal.  EEG shows mild to moderate diffuse encephalopathy.Keep on delirium precaution.  Continue Depakote. Level at 37.PT OT-at baseline does not ambulate.  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 Some worsening 2/2. Had having mild cognitive impairment/memory changes.  Subacute stroke Rapid response overnight 9/10 for facial droop, dysarthria CT head is stable and no intervention needed Stroke work up: LDL stable at 74, follow-up HbA1c 5.3 Neurology following, completed stroke work-up  as belwo per neuro: - Frequent neuro checks - Echocardiogram- EF 60-65%, no R WMA G1 DD -Neurology started on dual antiplatelets x3 weeks then revert back to Plavix after that.  On statins. - Risk factor modification - Telemetry monitoring; and cardiac monitor - Blood pressure goal   - Normotension  - Management of hypertensive emergency/urgency per standard protocols  - Permissive hypertension to 220/120 . PT OT SLP  Acute hypoxic respiratory failure likely from COVID  COVID-positive: chest x-ray clear, D-dimer /CRP stable with.  Continue Decadron, remdesivir, supplemental oxygen and monitor inflammatory markers.Continue airborne/contact precaution  Recent Labs    05/20/21 0500 05/20/21 1313 05/21/21 0244  DDIMER 0.29 <0.27 0.72*  FERRITIN  --  126  --   CRP <0.5 0.5 <0.5    UTI POA- Cont Rocephin, urine culture pending.Previously had pansensitive E. coli UTI in February 2022.  Neutropenia/thrombocytopenia/anemia-pancytopenia unclear etiology-differential includes due to COVID infection/ due to UT. B12 level is normal, monitor CBC closely cont Lovenox cautiously Recent Labs  Lab 05/19/21 1906 05/19/21 1920 05/20/21 1313 05/21/21 0244  HGB 11.4* 11.6* 10.3* 9.9*  HCT 34.7* 34.0*  31.0* 29.9*  WBC 5.0  --  2.9* 2.1*  PLT 171  --  115* 101*    HTN: BP stable.  Holding meds.  Malignant neoplasm of upper-inner quadrant of left breast in female, estrogen receptor positive: Continue her anastrozole.  Type 2 diabetes mellitus with hyperglycemia. stable hb A1c, continue sliding scale insulin.  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   Normocytic anemia:stable, Monitor hemoglobin. Recent Labs  Lab 05/19/21 1906 05/19/21 1920 05/20/21 1313 05/21/21 0244  HGB 11.4* 11.6* 10.3* 9.9*  HCT 34.7* 34.0* 31.0* 29.9*    History of depression:Mood is stable cont her multiple home meds  Hyperlipidemia:Stable lipid panel, continue simvastatin  [id number] is POA. Full code for now and I encouraged to discuss.  Given patient's poor baseline health status with multiple comorbidities we will request palliative care consultation agree with neurology.   Diet Order             Diet Carb Modified Fluid consistency: Thin; Room service appropriate? Yes  Diet effective now                 Patient's Body mass index is 31.09 kg/m.  DVT prophylaxis: enoxaparin (LOVENOX) injection 40 mg Start: 05/19/21 2300 Code Status:   Code Status: Full Code  Family Communication:plan of care discussed with patient at bedside. updated daughter over the phone.  Status [id number] as observation-changed to inpatient. Remains hospitalized for ongoing management of encephalopathy stroke work-up and COVID infection Dispo: The patient is from: ALF              Anticipated d/c is to: SNF              Patient currently is not medically stable to d/c.   Difficult to place patient No  Unresulted Labs (From admission, onward)     Start     Ordered   05/26/21 0500  Creatinine, serum  (enoxaparin  (LOVENOX)    CrCl >/= 30 ml/min)  Weekly,   R     Comments: while on enoxaparin therapy    05/19/21 2105   05/20/21 1132  Culture, blood (Routine X 2) w Reflex to ID Panel  BLOOD CULTURE X 2,   R      05/20/21 1131   05/20/21 1017  Urine Culture  Add-on,   AD       Question:  Indication  Answer:  Dysuria   05/20/21 1016   05/20/21 0500  D-dimer, quantitative  Daily,   R      05/19/21 2325   05/20/21 0500  C-reactive protein  Daily,   R      05/19/21 2325   05/20/21 0500  Comprehensive metabolic panel  Daily,   R      05/19/21 2325   05/20/21 0500  CBC with Differential/Platelet  Daily,   R      05/19/21 2325   05/20/21 0500  Magnesium  Daily,   R      05/19/21 2325   05/20/21 0500  Phosphorus  Daily,   R      05/19/21 2325           Medications reviewed:  Scheduled Meds:   stroke: mapping our early stages of recovery book   Does not apply Once   anastrozole  1 mg Oral Daily   vitamin C  500  mg Oral Daily   clopidogrel  75 mg Oral Daily   dexamethasone  6 mg Oral Q24H   divalproex  250 mg Oral Daily   And   divalproex  500 mg Oral QHS   enoxaparin (LOVENOX) injection  40 mg Subcutaneous Q24H   insulin aspart  0-15 Units Subcutaneous TID WC   levothyroxine  25 mcg Oral q morning   PARoxetine  20 mg Oral QHS   simvastatin  40 mg Oral QHS   zinc sulfate  220 mg Oral Daily   Continuous Infusions:  cefTRIAXone (ROCEPHIN)  IV Stopped (05/20/21 1204)   remdesivir 100 mg in NS 100 mL Stopped (05/20/21 1304)   Consultants:see note  Procedures:see note Antimicrobials: Anti-infectives (From admission, onward)    Start     Dose/Rate Route Frequency Ordered Stop   05/20/21 1030  cefTRIAXone (ROCEPHIN) 1 g in sodium chloride 0.9 % 100 mL IVPB        1 g 200 mL/hr over 30 Minutes Intravenous Every 24 hours 05/20/21 1017 05/25/21 1029   05/20/21 1000  remdesivir 100 mg in sodium chloride 0.9 % 100 mL IVPB       See Hyperspace for full Linked Orders Report.   100 mg 200 mL/hr  over 30 Minutes Intravenous Daily 05/19/21 2325 05/24/21 0959   05/19/21 2359  remdesivir 200 mg in sodium chloride 0.9% 250 mL IVPB       See Hyperspace for full Linked Orders Report.   200 mg 580 mL/hr over 30 Minutes Intravenous Once 05/19/21 2325 05/20/21 0108      Culture/Microbiology    Component Value Date/Time   SDES URINE, RANDOM 11/02/2020 0130   SPECREQUEST  11/02/2020 0130    NONE Performed at [facility] [address] [place], [place] [address]    CULT >=100,000 COLONIES/mL ESCHERICHIA COLI (A) 11/02/2020 0130   REPTSTATUS 11/04/2020 FINAL 11/02/2020 0130    Other culture-see note  Objective: Vitals: Today's Vitals   05/20/21 2331 05/21/21 0200 05/21/21 0404 05/21/21 0444  BP: (!) 129/59   (!) 117/58  Pulse: 73  62 63  Resp: '18  18 19  '$ Temp: 99.8 F (37.7 C)   99.2 F (37.3 C)  TempSrc: Oral   Oral  SpO2: 100%  97% 99%  Weight:      Height:      PainSc:  0-No pain      Intake/Output Summary (Last 24 hours) at 05/21/2021 0755 Last data filed at 05/21/2021 0500 Gross per 24 hour  Intake 440 ml  Output 625 ml  Net -185 ml    Filed Weights   05/20/21 1109  Weight: 77.1 kg   Weight change:   Intake/Output from previous day: 09/10 0701 - 09/11 0700 In: 440 [P.O.:240; IV Piggyback:200] Out: 625 [Urine:625] Intake/Output this shift: No intake/output data recorded. Filed Weights   05/20/21 1109  Weight: 77.1 kg    Examination: General exam: AAOx to self, current place, current president, HEENT:Oral mucosa moist, Ear/Nose WNL grossly, dentition normal. Respiratory system: bilaterally diminished, no use of accessory muscle Cardiovascular system: S1 & S2 +, No JVD,. Gastrointestinal system: Abdomen soft, NT,ND, BS+ Nervous System:Alert, awake, moving upper extremities, weak bilateral lower extremities.   Extremities: no edema, distal peripheral pulses palpable.  Skin: No rashes,no icterus. MSK: Normal muscle bulk,tone, power   Data  Reviewed: I have personally reviewed following labs and imaging studies CBC: Recent Labs  Lab 05/19/21 1906 05/19/21 1920 05/20/21 1313 05/21/21 0244  WBC  5.0  --  2.9* 2.1*  NEUTROABS 4.0  --   --  1.3*  HGB 11.4* 11.6* 10.3* 9.9*  HCT 34.7* 34.0* 31.0* 29.9*  MCV 94.3  --  96.0 95.5  PLT 171  --  115* 101*    Basic Metabolic Panel: Recent Labs  Lab 05/19/21 1906 05/19/21 1920 05/20/21 0500 05/21/21 0244  NA 135 138 139 138  K 4.1 4.2 4.1 3.9  CL 99 101 107 104  CO2 25  --  23 26  GLUCOSE 139* 145* 92 113*  BUN '16 17 15 16  '$ CREATININE 1.11* 1.00 1.07* 1.02*  CALCIUM 9.6  --  8.9 8.8*  MG  --   --  1.7 1.7  PHOS  --   --  3.8 4.2    GFR: Estimated Creatinine Clearance: 45.8 mL/min (A) (by C-G formula based on SCr of 1.02 mg/dL (H)). Liver Function Tests: Recent Labs  Lab 05/19/21 1906 05/20/21 0500 05/21/21 0244  AST 40 38 66*  ALT '16 15 28  '$ ALKPHOS 55 44 39  BILITOT 0.7 0.7 0.4  PROT 7.3 6.4* 6.2*  ALBUMIN 3.5 2.9* 2.8*    Recent Labs  Lab 05/19/21 1906  LIPASE 29    Recent Labs  Lab 05/19/21 2116  AMMONIA 23    Coagulation Profile: Recent Labs  Lab 05/19/21 1906  INR 1.0    Cardiac Enzymes: No results for input(s): CKTOTAL, CKMB, CKMBINDEX, TROPONINI in the last 168 hours. BNP (last 3 results) No results for input(s): PROBNP in the last 8760 hours. HbA1C: Recent Labs    05/20/21 1313  HGBA1C 5.3   CBG: Recent Labs  Lab 05/20/21 0858 05/20/21 1159 05/20/21 1853 05/20/21 2157 05/21/21 0606  GLUCAP 101* 120* 160* 140* 90    Lipid Profile: Recent Labs    05/20/21 0500  CHOL 124  HDL 35*  LDLCALC 74  TRIG 75  CHOLHDL 3.5    Thyroid Function Tests: Recent Labs    05/19/21 2116  TSH 1.354    Anemia Panel: Recent Labs    05/19/21 2116 05/20/21 1313  VITAMINB12 544  --   FERRITIN  --  126    Sepsis Labs: Recent Labs  Lab 05/20/21 1313  PROCALCITON <0.10  LATICACIDVEN 1.0    Recent Results (from the  past 240 hour(s))  Resp Panel by RT-PCR (Flu A&B, Covid) Nasopharyngeal Swab     Status: Abnormal   Collection Time: 05/19/21  8:34 PM   Specimen: Nasopharyngeal Swab; Nasopharyngeal(NP) swabs in vial transport medium  Result Value Ref Range Status   SARS Coronavirus 2 by RT PCR POSITIVE (A) NEGATIVE Final    Comment: RESULT CALLED TO, READ BACK BY AND VERIFIED WITH: RN BRENDA MICHAELSON 05/19/21'@23'$ :20 BY TW (NOTE) SARS-CoV-2 target nucleic acids are DETECTED.  The SARS-CoV-2 RNA is generally detectable in upper respiratory specimens during the acute phase of infection. Positive results are indicative of the presence of the identified virus, but do not rule out bacterial infection or co-infection with other pathogens not detected by the test. Clinical correlation with patient history and other diagnostic information is necessary to determine patient infection status. The expected result is Negative.  Fact Sheet for Patients: EntrepreneurPulse.com.au  Fact Sheet for Healthcare Providers: IncredibleEmployment.be  This test is not yet approved or cleared by the [place] FDA and  has been authorized for detection and/or diagnosis of SARS-CoV-2 by FDA under an Emergency Use Authorization (EUA).  This EUA will remain in effect (meaning this test  can be used) for the duration of  the COVID-19 declaration under Section 564(b)(1) of the Act, 21 U.S.C. section 360bbb-3(b)(1), unless the authorization is terminated or revoked sooner.     Influenza A by PCR NEGATIVE NEGATIVE Final   Influenza B by PCR NEGATIVE NEGATIVE Final    Comment: (NOTE) The Xpert Xpress SARS-CoV-2/FLU/RSV plus assay is intended as an aid in the diagnosis of influenza from Nasopharyngeal swab specimens and should not be used as a sole basis for treatment. Nasal washings and aspirates are unacceptable for Xpert Xpress SARS-CoV-2/FLU/RSV testing.  Fact Sheet for  Patients: EntrepreneurPulse.com.au  Fact Sheet for Healthcare Providers: IncredibleEmployment.be  This test is not yet approved or cleared by the [place] FDA and has been authorized for detection and/or diagnosis of SARS-CoV-2 by FDA under an Emergency Use Authorization (EUA). This EUA will remain in effect (meaning this test can be used) for the duration of the COVID-19 declaration under Section 564(b)(1) of the Act, 21 U.S.C. section 360bbb-3(b)(1), unless the authorization is terminated or revoked.  Performed at [facility], [place] [address], [place], [place] [address]       Radiology Studies: CT HEAD WO CONTRAST  Result Date: 05/19/2021 CLINICAL DATA:  Neurologic deficit, nausea and vomiting EXAM: CT HEAD WITHOUT CONTRAST TECHNIQUE: Contiguous axial images were obtained from the base of the skull through the vertex without intravenous contrast. COMPARISON:  01/31/2021 FINDINGS: Brain: Stable encephalomalacia in the left occipital region compatible with prior cortical infarct. There are stable hypodensities throughout the periventricular white matter compatible with chronic small vessel ischemic changes. No signs of acute infarct or hemorrhage. The lateral ventricles and remaining midline structures are unremarkable. No acute extra-axial fluid collections. No mass effect. Vascular: Stable atherosclerosis.  No hyperdense vessel. Skull: Normal. Negative for fracture or focal lesion. Sinuses/Orbits: No acute finding. Other: None. IMPRESSION: 1. No acute intracranial process. 2. Stable chronic ischemic changes throughout the white matter and left occipital lobe. Electronically Signed   By: Randa Ngo M.D.   On: 05/19/2021 19:05   MR BRAIN WO CONTRAST  Result Date: 05/19/2021 CLINICAL DATA:  Transient ischemic attack (TIA) EXAM: MRI HEAD WITHOUT CONTRAST TECHNIQUE: Multiplanar, multiecho pulse sequences of the brain and surrounding structures  were obtained without intravenous contrast. COMPARISON:  Same-day CT head.  MRI June 22 2020. FINDINGS: Brain: Punctate focus of DWI hyperintensity without definite ADC correlate in the left periventricular parietotemporal region (series 5, image 72). Slight T2 hyperintensity in this region. Remote left temporooccipital infarct with encephalomalacia. Remote small right cerebellar lacunar infarcts. Moderate to advanced patchy and confluent T2 hyperintensity within the white matter, nonspecific but compatible with chronic microvascular ischemic disease. Moderate atrophy with ex vacuo ventricular dilation. No acute hemorrhage, hydrocephalus, mass lesion, midline shift or extra-axial fluid collection. Vascular: Major arterial flow voids are maintained at the skull base. Skull and upper cervical spine: Normal marrow signal. Sinuses/Orbits: Clear sinuses.  No acute orbital findings. Other: No sizable mastoid effusions. IMPRESSION: 1. Possible punctate acute or subacute infarct in the left periventricular parietotemporal region. 2. Remote left temporooccipital and right cerebellar infarcts with moderate to advanced chronic microvascular ischemic disease. Electronically Signed   By: Margaretha Sheffield M.D.   On: 05/19/2021 20:17   DG Chest Port 1 View  Result Date: 05/19/2021 CLINICAL DATA:  Weakness EXAM: PORTABLE CHEST 1 VIEW COMPARISON:  06/01/2007 FINDINGS: The heart size and mediastinal contours are within normal limits when accounting for low lung volumes and technique. No focal pulmonary opacity. No  pleural effusion or pneumothorax. No acute osseous abnormality. IMPRESSION: No active disease. Electronically Signed   By: Merilyn Baba M.D.   On: 05/19/2021 19:06   EEG adult  Result Date: 05/20/2021 Lora Havens, MD     05/20/2021  8:42 AM Patient Name: Anna Hunt MRN: [id number] Epilepsy Attending: Lora Havens Referring Physician/Provider: Dr Amie [place] Date: 05/19/2021 Duration: 23.36 mins  Patient history:75 year old with past history of breast cancer, diabetes, prior large left occipital stroke with some residual right hemianopsia and vertigo presented with nausea and vomiting and some confusion as well as slowness of speech. EEG to evaluate for seizure Level of alertness: Awake AEDs during EEG study: VPA Technical aspects: This EEG study was done with scalp electrodes positioned according to the 10-20 International system of electrode placement. Electrical activity was acquired at a sampling rate of '500Hz'$  and reviewed with a high frequency filter of '70Hz'$  and a low frequency filter of '1Hz'$ . EEG data were recorded continuously and digitally stored. Description: The posterior dominant rhythm consists of 7.'5Hz'$  activity of moderate voltage (25-35 uV) seen predominantly in posterior head regions, symmetric and reactive to eye opening and eye closing. EEG showed continuous generalized polymorphic predominantly 5 to 7 Hz theta as well as intermittent 2-'3hz'$  delta slowing. Hyperventilation and photic stimulation were not performed.   ABNORMALITY - Continuous slow, generalized IMPRESSION: This study is suggestive of mild to moderate diffuse encephalopathy, nonspecific etiology. No seizures or epileptiform discharges were seen throughout the recording. Lora Havens   ECHOCARDIOGRAM COMPLETE  Result Date: 05/20/2021    ECHOCARDIOGRAM REPORT   Patient Name:   AMALEA JARDINES Date of Exam: 05/20/2021 Medical Rec #:  [id number]      Height:       62.0 in Accession #:    [id number]     Weight:       160.1 lb Date of Birth:  04/06/1946       BSA:          1.739 m Patient Age:    65 years       BP:           136/62 mmHg Patient Gender: F              HR:           101 bpm. Exam Location:  Inpatient Procedure: 2D Echo, Cardiac Doppler and Color Doppler Indications:    CVA  History:        Patient has prior history of Echocardiogram examinations, most                 recent 06/29/2020. TIA, Arrythmias:LBBB; Risk                  Factors:Hypertension and Diabetes.  Sonographer:    [place] Referring Phys: [id number] [place]  1. Systolic anterior motion of mitral valve with mildly elevated LVOT gradient at rest (2 m/s).  2. Left ventricular ejection fraction, by estimation, is 60 to 65%. The left ventricle has normal function. The left ventricle has no regional wall motion abnormalities. There is mild left ventricular hypertrophy. Left ventricular diastolic parameters are consistent with Grade I diastolic dysfunction (impaired relaxation).  3. Right ventricular systolic function is normal. The right ventricular size is normal. There is moderately elevated pulmonary artery systolic pressure.  4. The mitral valve is normal in structure. Mild mitral valve regurgitation. No evidence of mitral stenosis.  5. The aortic valve is tricuspid. Aortic  valve regurgitation is not visualized. Mild aortic valve sclerosis is present, with no evidence of aortic valve stenosis.  6. The inferior vena cava is normal in size with greater than 50% respiratory variability, suggesting right atrial pressure of 3 mmHg. FINDINGS  Left Ventricle: Left ventricular ejection fraction, by estimation, is 60 to 65%. The left ventricle has normal function. The left ventricle has no regional wall motion abnormalities. The left ventricular internal cavity size was normal in size. There is  mild left ventricular hypertrophy. Abnormal (paradoxical) septal motion, consistent with left bundle branch block. Left ventricular diastolic parameters are consistent with Grade I diastolic dysfunction (impaired relaxation). Right Ventricle: The right ventricular size is normal. Right ventricular systolic function is normal. There is moderately elevated pulmonary artery systolic pressure. The tricuspid regurgitant velocity is 3.28 m/s, and with an assumed right atrial pressure of 3 mmHg, the estimated right ventricular systolic pressure is [id number]. Left Atrium: Left  atrial size was normal in size. Right Atrium: Right atrial size was normal in size. Pericardium: There is no evidence of pericardial effusion. Mitral Valve: The mitral valve is normal in structure. Mild mitral valve regurgitation. No evidence of mitral valve stenosis. Tricuspid Valve: The tricuspid valve is normal in structure. Tricuspid valve regurgitation is mild . No evidence of tricuspid stenosis. Aortic Valve: The aortic valve is tricuspid. Aortic valve regurgitation is not visualized. Mild aortic valve sclerosis is present, with no evidence of aortic valve stenosis. Aortic valve mean gradient measures 9.0 mmHg. Aortic valve peak gradient measures 16.6 mmHg. Aortic valve area, by VTI measures 3.40 cm. Pulmonic Valve: The pulmonic valve was not well visualized. Pulmonic valve regurgitation is not visualized. No evidence of pulmonic stenosis. Aorta: The aortic root is normal in size and structure. Venous: The inferior vena cava is normal in size with greater than 50% respiratory variability, suggesting right atrial pressure of 3 mmHg. IAS/Shunts: No atrial level shunt detected by color flow Doppler. Additional Comments: Systolic anterior motion of mitral valve with mildly elevated LVOT gradient at rest (2 m/s).  LEFT VENTRICLE PLAX 2D LVIDd:         3.40 cm     Diastology LVIDs:         2.50 cm     LV e' medial:    6.09 cm/s LV PW:         1.50 cm     LV E/e' medial:  15.6 LV IVS:        1.30 cm     LV e' lateral:   16.50 cm/s LVOT diam:     2.20 cm     LV E/e' lateral: 5.7 LV SV:         145 LV SV Index:   83 LVOT Area:     3.80 cm  LV Volumes (MOD) LV vol d, MOD A4C: 37.1 ml LV vol s, MOD A4C: 15.8 ml LV SV MOD A4C:     37.1 ml RIGHT VENTRICLE             IVC RV S prime:     15.70 cm/s  IVC diam: 1.40 cm TAPSE (M-mode): 1.8 cm LEFT ATRIUM             Index       RIGHT ATRIUM           Index LA diam:        2.60 cm 1.50 cm/m  RA Area:     12.10 cm LA Vol (A2C):  35.1 ml 20.18 ml/m RA Volume:   25.50 ml   14.66 ml/m LA Vol (A4C):   31.4 ml 18.06 ml/m LA Biplane Vol: 33.4 ml 19.21 ml/m  AORTIC VALVE AV Area (Vmax):    3.78 cm AV Area (Vmean):   3.64 cm AV Area (VTI):     3.40 cm AV Vmax:           204.00 cm/s AV Vmean:          142.000 cm/s AV VTI:            0.426 m AV Peak Grad:      16.6 mmHg AV Mean Grad:      9.0 mmHg LVOT Vmax:         203.00 cm/s LVOT Vmean:        136.000 cm/s LVOT VTI:          0.381 m LVOT/AV VTI ratio: 0.89  AORTA Ao Root diam: 3.20 cm Ao Asc diam:  3.40 cm MITRAL VALVE                TRICUSPID VALVE MV Area (PHT): 3.55 cm     TR Peak grad:   43.0 mmHg MR Peak grad: 113.6 mmHg    TR Vmax:        328.00 cm/s MR Vmax:      533.00 cm/s MV E velocity: 94.80 cm/s   SHUNTS MV A velocity: 134.00 cm/s  Systemic VTI:  0.38 m MV E/A ratio:  0.71         Systemic Diam: 2.20 cm Kirk Ruths MD Electronically signed by Kirk Ruths MD Signature Date/Time: 05/20/2021/1:34:42 PM    Final    CT HEAD CODE STROKE WO CONTRAST  Result Date: 05/20/2021 CLINICAL DATA:  Code stroke.  Follow-up examination for stroke. EXAM: CT HEAD WITHOUT CONTRAST TECHNIQUE: Contiguous axial images were obtained from the base of the skull through the vertex without intravenous contrast. COMPARISON:  Prior CT and MRI from 05/19/2021. FINDINGS: Brain: Age-related cerebral atrophy with moderate chronic microvascular ischemic disease. Chronic left PCA distribution infarct again noted. Previously identified punctate left-sided acute infarct not visible by CT. No other acute large vessel territory infarct. No acute intracranial hemorrhage. No mass lesion or midline shift. No hydrocephalus or extra-axial fluid collection. Vascular: No hyperdense vessel. Calcified atherosclerosis at the skull base. Skull: Scalp soft tissues and calvarium demonstrate no new finding. Sinuses/Orbits: Globes and orbital soft tissues within normal limits. Paranasal sinuses and mastoid air cells remain clear. Other: None. ASPECTS [facility] Stroke  Program Early CT Score) - Ganglionic level infarction (caudate, lentiform nuclei, internal capsule, insula, M1-M3 cortex): 7 - Supraganglionic infarction (M4-M6 cortex): 3 Total score (0-10 with 10 being normal): 10 IMPRESSION: 1. Stable CT. No new acute intracranial abnormality. Previously identified punctate left-sided infarct not visible by CT. 2. ASPECTS is 10. 3. Atrophy with moderate chronic microvascular ischemic disease and chronic left PCA distribution infarct. These results were communicated to Dr. Rory Percy at 8:29 pm on 05/20/2021 by text page via the [facility] messaging system. Electronically Signed   By: Jeannine Boga M.D.   On: 05/20/2021 20:30     LOS: 1 day   Antonieta Pert, MD [facility]  05/21/2021, 7:55 AM

## 2021-05-21 NOTE — Progress Notes (Addendum)
Occupational Therapy Evaluation Patient Details Name: Anna Hunt MRN: [id number] DOB: Oct 08, 1945 Today's Date: 05/21/2021    History of Present Illness The pt is a 75 yo female presenting 9/9 due to concern for stroke and episode of vomiting. MRI revealed possible punctate acute or subacute infarct in the left periventricular parietotemporal region. PMH includes: breast cancer, DM II, depression, HTN, LBBB, L occipital stroke, cerebral artery occlusion with cerebral infarction, and vertigo.   Clinical Impression   Pt presents with above diagnosis. PTA pt PLOF living in ALF requiring assistance with ADLs. Pt is currently limited with safe ADL engagement due to weakness, decreased functional sitting/standing stability, processing skills, activity tolerance, and safety awareness. Pt will benefit from additional skilled level OT to address established deficits to maximize independence prior to dc to relief caregiver burden. DC recommendation to SNF.    Follow Up Recommendations  SNF    Equipment Recommendations   (defer to post acute rehab setting.)    Recommendations for Other Services       Precautions / Restrictions Precautions Precautions: Fall Precaution Comments: watch O2 Restrictions Weight Bearing Restrictions: No      Mobility Bed Mobility Overal bed mobility: Needs Assistance Bed Mobility: Rolling;Supine to Sit;Sit to Supine Rolling: Mod assist   Supine to sit: Mod assist Sit to supine: Max assist;+2 for physical assistance   General bed mobility comments: modA with significantly increased time and effort to complete rolling and supine-sit transition for BLE management and trunk elevation. verbal and visual cueing for hand placement with bed rail. maxA +2 to return to bed.    Transfers Overall transfer level: Needs assistance Equipment used: Rolling [place] (2 wheeled) Transfers: Sit to/from Stand Sit to Stand: Min assist;+2 physical assistance          General transfer comment: Heavy cueing for hand placment with use of RW, pt able to power up to stand with min A +2 for safety, min A +2 for static standing.    Balance Overall balance assessment: Needs assistance Sitting-balance support: Single extremity supported;Feet unsupported Sitting balance-Leahy Scale: Poor Sitting balance - Comments: modA  initially then to progressed to min guard to maintain static sitting EOB Postural control: Posterior lean Standing balance support: Bilateral upper extremity supported;During functional activity Standing balance-Leahy Scale: Zero Standing balance comment: pt dependent on therapist assist                           ADL either performed or assessed with clinical judgement   ADL Overall ADL's : Needs assistance/impaired     Grooming: Moderate assistance Grooming Details (indicate cue type and reason): combing of hair and use of shampoo cap in supported sitting, pt required most assistance with back of head and support in sitting due to poor trunk stability.           Upper Body Dressing Details (indicate cue type and reason): Not tested, however, will infer min A ro mod A for safety in sitting and orienation of clothing. Lower Body Dressing: Maximal assistance;Sitting/lateral leans Lower Body Dressing Details (indicate cue type and reason): Pt unable to demonstrate don/doff of socks for LB seated dressing.   Toilet Transfer Details (indicate cue type and reason): deferred for safety, pt engaged in sit to stand EOB with min A +2 with heavy cueing for safe hand placement and sequencing. Pt unable to follow commands to progress to side step towards EOB to address dynamic standing and functional mobility.  Vision Baseline Vision/History: 1 Wears glasses Ability to See in Adequate Light: 1 Impaired Patient Visual Report: Peripheral vision impairment (reports peripheral impairment from past CVA.) Vision  Assessment?: Yes Tracking/Visual Pursuits: Decreased smoothness of eye movement to RIGHT inferior field;Decreased smoothness of eye movement to LEFT inferior field Additional Comments: pt limited with tracking visual stim.     Perception Perception Perception Tested?: Yes   Praxis      Pertinent Vitals/Pain Pain Assessment: No/denies pain     Hand Dominance Right   Extremity/Trunk Assessment Upper Extremity Assessment Upper Extremity Assessment: Generalized weakness   Lower Extremity Assessment Lower Extremity Assessment: Defer to PT evaluation   Cervical / Trunk Assessment Cervical / Trunk Assessment: Other exceptions Cervical / Trunk Exceptions: large body habitus, poor core control   Communication Communication Communication: No difficulties   Cognition Arousal/Alertness: Awake/alert Behavior During Therapy: Flat affect Overall Cognitive Status: Impaired/Different from baseline Area of Impairment: Attention;Following commands;Memory;Safety/judgement;Awareness;Problem solving                   Current Attention Level: Focused Memory: Decreased recall of precautions;Decreased short-term memory Following Commands: Follows one step commands with increased time;Follows one step commands inconsistently Safety/Judgement: Decreased awareness of safety;Decreased awareness of deficits   Problem Solving: Slow processing;Difficulty sequencing;Decreased initiation;Requires verbal cues General Comments: pt with delayed processing of all instructions and questions, able to answer with increased time. with commands/instructions, pt often repeating statement back to therapist, limited with initation required verbal and visual cues for sequencing throughout session.   General Comments  pt on 3L O2 SpO2 94-97% throughout session.    Exercises     Shoulder Instructions      Home Living Family/patient expects to be discharged to:: Assisted living                              Home Equipment: Urena - 2 wheels;Shower seat          Prior Functioning/Environment Level of Independence: Needs assistance  Gait / Transfers Assistance Needed: pt reports use of w/c at the facility, however, questionable historian. chart review states pt mentions use of RW PTA. ADL's / Homemaking Assistance Needed: pt states she recieves assist from staff for all ADLs and food is brought to her room            OT Problem List: Decreased strength;Decreased range of motion;Decreased activity tolerance;Decreased cognition;Decreased safety awareness;Impaired balance (sitting and/or standing)      OT Treatment/Interventions:      OT Goals(Current goals can be found in the care plan section) Acute Rehab OT Goals Patient Stated Goal: none stated OT Goal Formulation: Patient unable to participate in goal setting (daughter out of room during evaluation) Time For Goal Achievement: 06/04/21 Potential to Achieve Goals: Fair  OT Frequency:     Barriers to D/C:            Co-evaluation              AM-PAC OT "6 Clicks" Daily Activity     Outcome Measure Help from another person eating meals?: A Little Help from another person taking care of personal grooming?: A Little Help from another person toileting, which includes using toliet, bedpan, or urinal?: A Lot Help from another person bathing (including washing, rinsing, drying)?: A Lot Help from another person to put on and taking off regular upper body clothing?: A Little Help from another person to put on and taking  off regular lower body clothing?: A Lot 6 Click Score: 15   End of Session Equipment Utilized During Treatment: Surveyor, mining Communication: Mobility status  Activity Tolerance: Patient tolerated treatment well Patient left: in bed;with call bell/phone within reach;with bed alarm set  OT Visit Diagnosis: Unsteadiness on feet (R26.81);Muscle weakness (generalized) (M62.81)                 Time: [id number] OT Time Calculation (min): 32 min Charges:  OT General Charges $OT Visit: 1 Visit OT Evaluation $OT Eval Moderate Complexity: [place], MSOT, OTR/L  Supplemental Rehabilitation Services  [phone]   Marius Ditch 05/21/2021, 3:47 PM

## 2021-05-21 NOTE — Consult Note (Addendum)
Consultation Note Date: 05/21/2021   Patient Name: Anna Hunt  DOB: 03/11/1946  MRN: [id number]  Age / Sex: 75 y.o., female  PCP: Lawerance Cruel, MD Referring Physician: Antonieta Pert, MD  Reason for Consultation: Establishing goals of care "multiple issues poor baseline health"  HPI/Patient Profile: 75 y.o. 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 In the ED, patient was febrile and UA consistent with UTI. Also COVID positive and hypoxic requiring supplemental oxygen. MRI of the brain reveals stable large left occipital stroke with new punctuate area of restricted diffusion. Neurology felt toxic metabolic encephalopathy was more likely etiology for altered mental status and that subacute stroke likely incidental.  9/10 - code stroke called for unusual episode of prolonged confusion and unresponsiveness lasting for several minutes; CT head was stable. Neurology suspects complex partial seizure.   Clinical Assessment and Goals of Care: I have reviewed medical records including EPIC notes, labs and imaging, and examined the patient at bedside. She is very pleasant, alert, and seems oriented. However, later in my visit it becomes apparent that she has significant cognitive impairment.   I met with daughter Caryl Asp in the 3W waiting room to discuss diagnosis, prognosis, GOC, EOL wishes, disposition, and options.  I introduced Palliative Medicine as specialized medical care for people living with serious illness. It focuses on providing  relief from the symptoms and stress of a serious illness.   We discussed a brief life review of the patient. She is divorced. Prior to retirement, she worked at [employer], a Recruitment consultant, and later for an [employer]. She has 2 daughters - Caryl Asp and [place]. Caryl Asp is her HCPOA and very involved in her care.   As far as functional status, Joy states there has been significant decline over the past 8 months. She has been living at an assisted living facility since April 2022. She is non-ambulatory and dependent for all ADLs at baseline. Joy reports her mother eats well, but now requires assistance with feeding.   We discussed her current illness and what it means in the larger context of her ongoing co-morbidities.  Natural disease trajectory of dementia was discussed. We reviewed that dementia is a progressive, non-curable disease underlying the patient's current acute medical conditions. We reviewed specific indicators of end-stage dementia, including inability to communicate, bed bound/non-ambulatory status, decreased oral intake, and incontinence of bowel/bladder.  Joy expresses much frustration that many people do not seem to recognize how significant is her mother's cognitive impairment. She reports her mother does not remember anything from the previous day. She is adamant about being called for any updates or changes in clinical status.   Joy feels that her mother is likely approaching EOL, possibly within the  next few months. Provided education and counseling at length on the philosophy and benefits of hospice care. Discussed that it offers a holistic approach to care in the setting of end-stage illness/disease, and is about supporting the patient where they are while allowing a natural course to occur. Discussed the hospice team includes RNs, physicians, social workers, and chaplains. They can provide personal care, support for the family, and help keep patient out of the hospital.  Daughter agrees with the addition of hospice support at the assisted living facility and states preference for [facility].   We discussed code status. In the event of cardiopulmonary arrest, Joy states her mother would not want aggressive resuscitation efforts (CPR, intubation, defibrillation, or ACLS medications). She would prefer to pass naturally and peacefully. I recommended having a DNR order in place, to which Joy agrees.   Living will was reviewed. It states patient's desire not to receive life-prolonging measures in the following situations: She has a condition that is incurable and will result in death within a short period of time She is unconscious and her medical team is confident she cannot regain consciousness She has advanced dementia or other substantial and irreversible loss of mental function However, on the living will she also initialed the box indicating she would want to receive tube feeding in the situations above. Joy states that her mother would NOT want a feeding tube under any circumstances. She believes her mother initialed this by mistake. We discussed that artificial feeding is not recommended in patients with advanced dementia as evidence has shown it does not prolong life, promote quality of life, or prevent aspiration.     Joy and I completed a MOST form together. The following treatment decisions were outlined:  Cardiopulmonary Resuscitation: Do Not Attempt Resuscitation (DNR/No CPR)  Medical Interventions: Comfort Measures: Keep clean, warm, and dry. Use medication by any route, positioning, wound care, and other measures to relieve pain and suffering. Use oxygen, suction and manual treatment of airway obstruction as needed for comfort. Do not transfer to the hospital unless comfort needs cannot be met in current location.  Antibiotics: Determine use of limitation of antibiotics when infection occurs  IV Fluids: IV fluids for a defined trial period  Feeding Tube:  No feeding tube     Primary decision maker: Dionna Wiedemann (daughter and HCPOA)    SUMMARY OF RECOMMENDATIONS   Code status changed to DNR/DNI Plan for discharge back to ALF with hospice when medically optimized - I placed a TOC order and spoke with hospice liaison for [facility] Patient has significant cognitive and memory impairment - please call daughter Caryl Asp with any updates Copy made of HCPOA and living will documents to scan into [employer] MOST form completed - copy made to scan into [employer] and original placed on shadow chart PMT will continue to follow   Code Status/Advance Care Planning: DNR  Palliative Prophylaxis:  Oral Care and Turn Reposition  Additional Recommendations (Limitations, Scope, Preferences): No Artificial Feeding  Psycho-social/Spiritual:  Created space and opportunity for family to express thoughts and feelings regarding patient's current medical situation.  Emotional support provided   Prognosis:  < 6 months  Discharge Planning:  ALF with hospice     Primary Diagnoses: Present on Admission:  Acute CVA (cerebrovascular accident) ([place])  HTN (hypertension), benign  Normocytic anemia  Type 2 diabetes mellitus with hyperglycemia (HCC)  Decreased GFR  Hyperlipidemia  COVID-19 virus infection   I have reviewed the medical record, interviewed the patient and family, and  examined the patient. The following aspects are pertinent.  Past Medical History:  Diagnosis Date   Breast cancer ([place])    Diabetes mellitus without complication ([place])    Displacement of cervical intervertebral disc without myelopathy    Family history of adverse reaction to anesthesia    per patient "younger sister was nauseated"   History of depression    Hypertension    LBBB (left bundle branch block)    Occipital stroke ([place]) - left    Unspecified cerebral artery occlusion with cerebral infarction    Vertigo      Family History  Problem Relation Age of Onset   Heart  attack Sister 28   Breast cancer Sister 59   Breast cancer Sister 84   Scheduled Meds:   stroke: mapping our early stages of recovery book   Does not apply Once   anastrozole  1 mg Oral Daily   vitamin C  500 mg Oral Daily   aspirin EC  81 mg Oral Daily   clopidogrel  75 mg Oral Daily   dexamethasone  6 mg Oral Q24H   [START ON 05/22/2021] divalproex  500 mg Oral Daily   And   divalproex  500 mg Oral QHS   enoxaparin (LOVENOX) injection  40 mg Subcutaneous Q24H   insulin aspart  0-15 Units Subcutaneous TID WC   levothyroxine  25 mcg Oral q morning   PARoxetine  20 mg Oral QHS   simvastatin  40 mg Oral QHS   zinc sulfate  220 mg Oral Daily   Continuous Infusions:  cefTRIAXone (ROCEPHIN)  IV Stopped (05/20/21 1204)   remdesivir 100 mg in NS 100 mL 100 mg (05/21/21 1147)   PRN Meds:.acetaminophen **OR** acetaminophen, albuterol, guaiFENesin-dextromethorphan, LORazepam, ondansetron **OR** ondansetron (ZOFRAN) IV, traMADol   Allergies  Allergen Reactions   Niaspan [Niacin Er] Hives and Swelling    Physical Exam Vitals reviewed.  Constitutional:      General: She is not in acute distress.    Comments: Chronically ill-appearing and frail  Pulmonary:     Effort: Pulmonary effort is normal.  Neurological:     Mental Status: She is alert.     Motor: Weakness present.  Psychiatric:        Cognition and Memory: Cognition is impaired. Memory is impaired.    Vital Signs: BP (!) 114/53 (BP Location: Right Arm)   Pulse 64   Temp 97.8 F (36.6 C) (Oral)   Resp 18   Ht 5' 2"  (1.575 m)   Wt 77.1 kg   SpO2 100%   BMI 31.09 kg/m  Pain Scale: 0-10   Pain Score: 0-No pain   SpO2: SpO2: 100 % O2 Device:SpO2: 100 % O2 Flow Rate: .O2 Flow Rate (L/min): 2 L/min  IO: Intake/output summary:  Intake/Output Summary (Last 24 hours) at 05/21/2021 1250 Last data filed at 05/21/2021 0500 Gross per 24 hour  Intake 340 ml  Output 625 ml  Net -285 ml    LBM:   Baseline Weight:  Weight: 77.1 kg Most recent weight: Weight: 77.1 kg      Palliative Assessment/Data: PPS 30%     Time In: 1350 Time Out: 1505 Time Total: 75 minutes Greater than 50%  of this time was spent counseling and coordinating care related to the above assessment and plan.  Signed by: Lavena Bullion, NP   Please contact Palliative Medicine Team phone at [phone] for questions and concerns.  For individual provider: See Shea Evans

## 2021-05-21 NOTE — Plan of Care (Signed)
  Problem: Education: Goal: Knowledge of disease or condition will improve Outcome: Progressing Goal: Knowledge of secondary prevention will improve Outcome: Progressing   

## 2021-05-21 NOTE — Progress Notes (Signed)
Miss Anna Hunt, Anna Hunt has blister with full of blood in her left eye's sclera which was note in the day shift, her both eyes were normal this morning, no change in vision, denies of pain, no any dischages, notified oncall provider, and will continue to monitor.

## 2021-05-22 LAB — GLUCOSE, CAPILLARY
Glucose-Capillary: 105 mg/dL — ABNORMAL HIGH (ref 70–99)
Glucose-Capillary: 117 mg/dL — ABNORMAL HIGH (ref 70–99)
Glucose-Capillary: 133 mg/dL — ABNORMAL HIGH (ref 70–99)
Glucose-Capillary: 228 mg/dL — ABNORMAL HIGH (ref 70–99)
Glucose-Capillary: 200 mg/dL — ABNORMAL HIGH (ref 70–99)

## 2021-05-22 LAB — CBC WITH DIFFERENTIAL/PLATELET
Abs Immature Granulocytes: 0.01 10*3/uL (ref 0.00–0.07)
Basophils Absolute: 0 10*3/uL (ref 0.0–0.1)
Basophils Relative: 1 %
Eosinophils Absolute: 0 10*3/uL (ref 0.0–0.5)
Eosinophils Relative: 0 %
HCT: 31.3 % — ABNORMAL LOW (ref 36.0–46.0)
Hemoglobin: 10.2 g/dL — ABNORMAL LOW (ref 12.0–15.0)
Immature Granulocytes: 1 %
Lymphocytes Relative: 38 %
Lymphs Abs: 0.8 10*3/uL (ref 0.7–4.0)
MCH: 30.9 pg (ref 26.0–34.0)
MCHC: 32.6 g/dL (ref 30.0–36.0)
MCV: 94.8 fL (ref 80.0–100.0)
Monocytes Absolute: 0.3 10*3/uL (ref 0.1–1.0)
Monocytes Relative: 12 %
Neutro Abs: 1.1 10*3/uL — ABNORMAL LOW (ref 1.7–7.7)
Neutrophils Relative %: 48 %
Platelets: 98 10*3/uL — ABNORMAL LOW (ref 150–400)
RBC: 3.3 MIL/uL — ABNORMAL LOW (ref 3.87–5.11)
RDW: 12.4 % (ref 11.5–15.5)
WBC: 2.2 10*3/uL — ABNORMAL LOW (ref 4.0–10.5)
nRBC: 0 % (ref 0.0–0.2)

## 2021-05-22 LAB — URINE CULTURE: Culture: >=100000 — AB

## 2021-05-22 LAB — COMPREHENSIVE METABOLIC PANEL
ALT: 26 U/L (ref 0–44)
AST: 47 U/L — ABNORMAL HIGH (ref 15–41)
Albumin: 2.6 g/dL — ABNORMAL LOW (ref 3.5–5.0)
Alkaline Phosphatase: 42 U/L (ref 38–126)
Anion gap: 7 (ref 5–15)
BUN: 23 mg/dL (ref 8–23)
CO2: 27 mmol/L (ref 22–32)
Calcium: 8.8 mg/dL — ABNORMAL LOW (ref 8.9–10.3)
Chloride: 104 mmol/L (ref 98–111)
Creatinine, Ser: 1.21 mg/dL — ABNORMAL HIGH (ref 0.44–1.00)
GFR, Estimated: 47 mL/min — ABNORMAL LOW (ref >60)
Glucose, Bld: 133 mg/dL — ABNORMAL HIGH (ref 70–99)
Potassium: 4.1 mmol/L (ref 3.5–5.1)
Sodium: 138 mmol/L (ref 135–145)
Total Bilirubin: 0.6 mg/dL (ref 0.3–1.2)
Total Protein: 5.8 g/dL — ABNORMAL LOW (ref 6.5–8.1)

## 2021-05-22 LAB — MAGNESIUM: Magnesium: 1.7 mg/dL (ref 1.7–2.4)

## 2021-05-22 LAB — C-REACTIVE PROTEIN: CRP: 0.7 mg/dL (ref <1.0)

## 2021-05-22 LAB — D-DIMER, QUANTITATIVE: D-Dimer, Quant: 0.33 ug/mL-FEU (ref 0.00–0.50)

## 2021-05-22 LAB — VALPROIC ACID LEVEL: Valproic Acid Lvl: 46 ug/mL — ABNORMAL LOW (ref 50.0–100.0)

## 2021-05-22 LAB — PHOSPHORUS: Phosphorus: 4 mg/dL (ref 2.5–4.6)

## 2021-05-22 NOTE — Progress Notes (Signed)
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  Patient was able to answer simple questions. Patient seen in the ED,temperature 98.2 F, pulse 98, respiration 16, BP 130/83 mmHg O2 sat 94% on room air.  Blood work normal troponin lipase stable CBC, covid+ Cxr-did not show any active disease.  CT head without contrast did not show any acute intracranial process.  There were stable chronic ischemic changes throughout the white matter and left occipital lobe.  MRI brain with without contrast showed possible punctate acute or subacute infarct in the left periventricular parietotemporal region.  There are remote left temporo-occipital and right cerebellar infarcts with moderate to outlines chronic microvascular ischemic change in disease. Neurology was consulted and patient admitted-for acute encephalopathy, subacute stroke.  Patient had fever spike in the ED and UA consistent with UTI, and also hypoxic needing supplemental oxygen for COVID 9/10 pm-rapid response called/code stroke due to increased facial droop and or difficulty seen by neurology-stat CT head stable, no intervention done  Subjective: Seen this morning.  On 2 L nasal cannula, alert awake mildly confused but overall stable.  No complaints.    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 UDS negative CT head/MRI brain with subacute stroke. b12, tsh normal.  EEG shows mild to moderate diffuse encephalopathy.mental status improving, continue fall precaution, delirium precaution, continue .Continue Depakote. Level at 37.PT OT-at baseline does not ambulate.  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 Some worsening 2/2 multifactorial. Had having mild cognitive impairment/memory changes.  Subacute stroke Rapid response overnight 9/10 for facial droop, dysarthria CT head is stable and no intervention needed Stroke work up: LDL stable at 74, follow-up HbA1c 5.3 Neurology following, completed stroke work-up  as belwo per neuro: - Frequent neuro checks - Echocardiogram- EF 60-65%, no R WMA G1 DD -Neurology started on dual antiplatelets x3 weeks then revert back to Plavix after that.  Continue statins. - Risk factor modification - Telemetry monitoring; and cardiac monitor - Blood pressure goal   - Normotension  - Management of hypertensive emergency/urgency per standard protocols  - Permissive hypertension to 220/120 . PT OT SLP to continue.  Acute hypoxic respiratory failure likely from COVID  COVID-positive: chest x-ray clear, D-dimer /CRP stable with.  Continue Decadronx10 days, remdesivir x 5 days, supplemental oxygen and monitor inflammatory markers.Continue airborne/contact precaution  Recent Labs    05/20/21 1313 05/21/21 0244 05/22/21 0349  DDIMER <0.27 0.72* 0.33  FERRITIN 126  --   --   CRP 0.5 <0.5 0.7    E. coli UTI POA-pansensitive, cont Rocephin, blood culture no growth so far.    Neutropenia/thrombocytopenia/anemia-likely due to COVID infection. B12 level is normal, monitor CBC closely cont Lovenox cautiously Recent Labs  Lab 05/20/21 1313 05/21/21 0244 05/22/21 0349  HGB 10.3* 9.9* 10.2*  HCT 31.0* 29.9* 31.3*  WBC 2.9* 2.1* 2.2*  PLT 115* 101* 98*  HTN: BP  stable.  Malignant neoplasm of upper-inner quadrant of left breast in female, estrogen receptor positive: Continue her anastrozole.  Type 2 diabetes mellitus with hyperglycemia. stable hb A1c, blood sugar at goal, continue sliding scale insulin.Hold metformin for now. 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   Normocytic anemia:stable:Monitor HB. Recent Labs  Lab 05/19/21 1906 05/19/21 1920 05/20/21 1313 05/21/21 0244 05/22/21 0349  HGB 11.4* 11.6* 10.3* 9.9* 10.2*  HCT 34.7* 34.0* 31.0* 29.9* 31.3*     History of depression:Mood is stable. Cont her multiple home meds.  Hyperlipidemia:Stable lipid panel, continue simvastatin.  Subconjunctival hemorrhage on left eye monitor.  Asymptomatic.  She is on antiplatelets dual,.  Slightly low continue to monitor.  [id number] is POA.  Palliative care input appreciated now DNR.     Diet Order             Diet Carb Modified Fluid consistency: Thin; Room service appropriate? Yes  Diet effective now                 Patient's Body mass index is 31.09 kg/m.  DVT prophylaxis: enoxaparin (LOVENOX) injection 40 mg Start: 05/19/21 2300 Code Status:   Code Status: DNR  Family Communication:plan of care discussed with patient at bedside. updated daughter previously over the phone.  Status [id number] as observation-changed to inpatient. Remains hospitalized for ongoing management of encephalopathy stroke work-up and COVID infection Dispo: The patient is from: ALF              Anticipated d/c is to: SNF              Patient currently is not medically stable to d/c.   Difficult to place patient No  Unresulted Labs (From admission, onward)     Start     Ordered   05/26/21 0500  Creatinine, serum  (enoxaparin (LOVENOX)    CrCl >/= 30 ml/min)  Weekly,   R      Comments: while on enoxaparin therapy    05/19/21 2105   05/20/21 0500  D-dimer, quantitative  Daily,   R      05/19/21 2325   05/20/21 0500  C-reactive protein  Daily,   R      05/19/21 2325   05/20/21 0500  Comprehensive metabolic panel  Daily,   R      05/19/21 2325   05/20/21 0500  CBC with Differential/Platelet  Daily,   R      05/19/21 2325   05/20/21 0500  Magnesium  Daily,   R      05/19/21 2325   05/20/21 0500  Phosphorus  Daily,   R      05/19/21 2325           Medications reviewed:  Scheduled Meds:   stroke: mapping our early stages of recovery book   Does not apply Once   anastrozole  1 mg Oral Daily   vitamin C  500 mg Oral Daily   aspirin EC  81 mg Oral Daily   clopidogrel  75 mg Oral Daily   dexamethasone  6 mg Oral Q24H   divalproex  500 mg Oral Daily   And   divalproex  500 mg Oral QHS   enoxaparin (LOVENOX) injection  40 mg Subcutaneous Q24H   insulin aspart  0-15 Units Subcutaneous TID  WC   levothyroxine  25 mcg Oral q morning   PARoxetine  20 mg Oral QHS   simvastatin  40 mg Oral QHS   zinc sulfate  220 mg Oral Daily   Continuous Infusions:  cefTRIAXone (ROCEPHIN)  IV 1 g (05/22/21 1048)   remdesivir 100 mg in NS 100 mL 100 mg (05/22/21 1007)   Consultants:see note  Procedures:see note Antimicrobials: Anti-infectives (From admission, onward)    Start     Dose/Rate Route Frequency Ordered Stop   05/20/21 1030  cefTRIAXone (ROCEPHIN) 1 g in sodium chloride 0.9 % 100 mL IVPB        1 g 200 mL/hr over 30 Minutes Intravenous Every 24 hours 05/20/21 1017 05/25/21 1029   05/20/21 1000  remdesivir 100 mg in sodium chloride 0.9 % 100 mL IVPB       See Hyperspace for full Linked Orders Report.   100 mg 200 mL/hr over 30 Minutes Intravenous Daily 05/19/21 2325 05/24/21 0959   05/19/21 2359  remdesivir 200 mg in sodium chloride 0.9% 250 mL IVPB       See Hyperspace for full Linked Orders Report.   200 mg 580 mL/hr over 30 Minutes Intravenous Once  05/19/21 2325 05/20/21 0108      Culture/Microbiology    Component Value Date/Time   SDES BLOOD SITE NOT SPECIFIED 05/20/2021 1328   SPECREQUEST  05/20/2021 1328    BOTTLES DRAWN AEROBIC ONLY Blood Culture adequate volume   CULT  05/20/2021 1328    NO GROWTH 1 DAY Performed at [facility], [place] [address] [place], [place] [address]    REPTSTATUS PENDING 05/20/2021 1328    Other culture-see note  Objective: Vitals: Today's Vitals   05/21/21 2351 05/22/21 0600 05/22/21 0600 05/22/21 1001  BP: 115/62  124/66 132/65  Pulse: (!) 57  (!) 55 (!) 59  Resp: 20  18   Temp: 98.3 F (36.8 C)  98 F (36.7 C) 99.1 F (37.3 C)  TempSrc: Oral  Oral Oral  SpO2: 97%  100% 100%  Weight:      Height:      PainSc:  0-No pain  0-No pain    Intake/Output Summary (Last 24 hours) at 05/22/2021 1327 Last data filed at 05/22/2021 0649 Gross per 24 hour  Intake 525 ml  Output 2500 ml  Net -1975 ml    Filed Weights   05/20/21 1109  Weight: 77.1 kg   Weight change:   Intake/Output from previous day: 09/11 0701 - 09/12 0700 In: 525 [P.O.:270; IV Piggyback:255] Out: 2500 [Urine:2500] Intake/Output this shift: No intake/output data recorded. Filed Weights   05/20/21 1109  Weight: 77.1 kg    Examination: General exam: AAOx to self, place, older than stated age, weak appearing. HEENT:Oral mucosa moist, Ear/Nose WNL grossly, dentition normal. Respiratory system: bilaterally diminished,  no use of accessory muscle Cardiovascular system: S1 & S2 +, No JVD,. Gastrointestinal system: Abdomen soft,NT,ND, BS+ Nervous System:Alert, awake, moving extremities and grossly nonfocal Extremities: no edema, distal peripheral pulses palpable.  Skin: No rashes,no icterus. MSK: Normal muscle bulk,tone, power    Data Reviewed: I have personally reviewed following labs and imaging studies CBC: Recent Labs  Lab 05/19/21 1906 05/19/21 1920 05/20/21 1313 05/21/21 0244 05/22/21 0349  WBC  5.0  --  2.9* 2.1* 2.2*  NEUTROABS 4.0  --   --  1.3* 1.1*  HGB 11.4* 11.6* 10.3* 9.9* 10.2*  HCT 34.7* 34.0* 31.0* 29.9* 31.3*  MCV 94.3  --  96.0 95.5 94.8  PLT 171  --  115* 101* 98*    Basic Metabolic Panel: Recent Labs  Lab 05/19/21 1906 05/19/21 1920 05/20/21 0500 05/21/21 0244 05/22/21 0349  NA 135 138 139 138 138  K 4.1 4.2 4.1 3.9 4.1  CL 99 101 107 104 104  CO2 25  --  '23 26 27  '$ GLUCOSE 139* 145* 92 113* 133*  BUN '16 17 15 16 23  '$ CREATININE 1.11* 1.00 1.07* 1.02* 1.21*  CALCIUM 9.6  --  8.9 8.8* 8.8*  MG  --   --  1.7 1.7 1.7  PHOS  --   --  3.8 4.2 4.0    GFR: Estimated Creatinine Clearance: 38.6 mL/min (A) (by C-G formula based on SCr of 1.21 mg/dL (H)). Liver Function Tests: Recent Labs  Lab 05/19/21 1906 05/20/21 0500 05/21/21 0244 05/22/21 0349  AST 40 38 66* 47*  ALT '16 15 28 26  '$ ALKPHOS 55 44 39 42  BILITOT 0.7 0.7 0.4 0.6  PROT 7.3 6.4* 6.2* 5.8*  ALBUMIN 3.5 2.9* 2.8* 2.6*    Recent Labs  Lab 05/19/21 1906  LIPASE 29    Recent Labs  Lab 05/19/21 2116  AMMONIA 23    Coagulation Profile: Recent Labs  Lab 05/19/21 1906  INR 1.0    Cardiac Enzymes: No results for input(s): CKTOTAL, CKMB, CKMBINDEX, TROPONINI in the last 168 hours. BNP (last 3 results) No results for input(s): PROBNP in the last 8760 hours. HbA1C: Recent Labs    05/20/21 1313  HGBA1C 5.3    CBG: Recent Labs  Lab 05/21/21 1614 05/21/21 2220 05/22/21 0613 05/22/21 1047 05/22/21 1211  GLUCAP 146* 222* 105* 117* 133*    Lipid Profile: Recent Labs    05/20/21 0500  CHOL 124  HDL 35*  LDLCALC 74  TRIG 75  CHOLHDL 3.5    Thyroid Function Tests: Recent Labs    05/19/21 2116  TSH 1.354    Anemia Panel: Recent Labs    05/19/21 2116 05/20/21 1313  VITAMINB12 544  --   FERRITIN  --  126    Sepsis Labs: Recent Labs  Lab 05/20/21 1313  PROCALCITON <0.10  LATICACIDVEN 1.0     Recent Results (from the past 240 hour(s))  Resp Panel  by RT-PCR (Flu A&B, Covid) Nasopharyngeal Swab     Status: Abnormal   Collection Time: 05/19/21  8:34 PM   Specimen: Nasopharyngeal Swab; Nasopharyngeal(NP) swabs in vial transport medium  Result Value Ref Range Status   SARS Coronavirus 2 by RT PCR POSITIVE (A) NEGATIVE Final    Comment: RESULT CALLED TO, READ BACK BY AND VERIFIED WITH: RN BRENDA MICHAELSON 05/19/21'@23'$ :20 BY TW (NOTE) SARS-CoV-2 target nucleic acids are DETECTED.  The SARS-CoV-2 RNA is generally detectable in upper respiratory specimens during the acute phase of infection. Positive results are indicative of the presence of the identified virus, but do not rule out bacterial infection or co-infection with other pathogens not detected by the test. Clinical correlation with patient history and other diagnostic information is necessary to determine patient infection status. The expected result is Negative.  Fact Sheet for Patients: EntrepreneurPulse.com.au  Fact Sheet for Healthcare Providers: IncredibleEmployment.be  This test is not yet approved or cleared by the [place] FDA and  has been authorized for detection and/or diagnosis of SARS-CoV-2 by FDA under an Emergency Use Authorization (EUA).  This EUA will remain in effect (meaning this test  can be used) for the duration of  the  COVID-19 declaration under Section 564(b)(1) of the Act, 21 U.S.C. section 360bbb-3(b)(1), unless the authorization is terminated or revoked sooner.     Influenza A by PCR NEGATIVE NEGATIVE Final   Influenza B by PCR NEGATIVE NEGATIVE Final    Comment: (NOTE) The Xpert Xpress SARS-CoV-2/FLU/RSV plus assay is intended as an aid in the diagnosis of influenza from Nasopharyngeal swab specimens and should not be used as a sole basis for treatment. Nasal washings and aspirates are unacceptable for Xpert Xpress SARS-CoV-2/FLU/RSV testing.  Fact Sheet for  Patients: EntrepreneurPulse.com.au  Fact Sheet for Healthcare Providers: IncredibleEmployment.be  This test is not yet approved or cleared by the [place] FDA and has been authorized for detection and/or diagnosis of SARS-CoV-2 by FDA under an Emergency Use Authorization (EUA). This EUA will remain in effect (meaning this test can be used) for the duration of the COVID-19 declaration under Section 564(b)(1) of the Act, 21 U.S.C. section 360bbb-3(b)(1), unless the authorization is terminated or revoked.  Performed at [facility], [place] [address], [place], [place] [address]   Urine Culture     Status: Abnormal   Collection Time: 05/20/21 10:17 AM   Specimen: Urine, Clean Catch  Result Value Ref Range Status   Specimen Description URINE, CLEAN CATCH  Final   Special Requests   Final    NONE Performed at [facility], [place] [address] [place], [place] [address]    Culture >=100,000 COLONIES/mL ESCHERICHIA COLI (A)  Final   Report Status 05/22/2021 FINAL  Final   Organism ID, Bacteria ESCHERICHIA COLI (A)  Final      Susceptibility   Escherichia coli - MIC*    AMPICILLIN <=2 SENSITIVE Sensitive     CEFAZOLIN <=4 SENSITIVE Sensitive     CEFEPIME <=0.12 SENSITIVE Sensitive     CEFTRIAXONE <=0.25 SENSITIVE Sensitive     CIPROFLOXACIN <=0.25 SENSITIVE Sensitive     GENTAMICIN <=1 SENSITIVE Sensitive     IMIPENEM <=0.25 SENSITIVE Sensitive     NITROFURANTOIN <=16 SENSITIVE Sensitive     TRIMETH/SULFA <=20 SENSITIVE Sensitive     AMPICILLIN/SULBACTAM <=2 SENSITIVE Sensitive     PIP/TAZO <=4 SENSITIVE Sensitive     * >=100,000 COLONIES/mL ESCHERICHIA COLI  Culture, blood (Routine X 2) w Reflex to ID Panel     Status: None (Preliminary result)   Collection Time: 05/20/21  1:27 PM   Specimen: BLOOD  Result Value Ref Range Status   Specimen Description BLOOD SITE NOT SPECIFIED  Final   Special Requests   Final    BOTTLES DRAWN  AEROBIC AND ANAEROBIC Blood Culture results may not be optimal due to an excessive volume of blood received in culture bottles   Culture   Final    NO GROWTH 1 DAY Performed at [facility], [address], [place], [place] [address]    Report Status PENDING  Incomplete  Culture, blood (Routine X 2) w Reflex to ID Panel     Status: None (Preliminary result)   Collection Time: 05/20/21  1:28 PM   Specimen: BLOOD  Result Value Ref Range Status   Specimen Description BLOOD SITE NOT SPECIFIED  Final   Special Requests   Final    BOTTLES DRAWN AEROBIC ONLY Blood Culture adequate volume   Culture   Final    NO GROWTH 1 DAY Performed at [facility], [place] [address], [place], [place] [address]    Report Status PENDING  Incomplete  Radiology Studies: CT HEAD CODE STROKE WO CONTRAST  Result Date: 05/20/2021 CLINICAL DATA:  Code stroke.  Follow-up examination for stroke. EXAM: CT HEAD WITHOUT CONTRAST TECHNIQUE: Contiguous axial images were obtained from the base of the skull through the vertex without intravenous contrast. COMPARISON:  Prior CT and MRI from 05/19/2021. FINDINGS: Brain: Age-related cerebral atrophy with moderate chronic microvascular ischemic disease. Chronic left PCA distribution infarct again noted. Previously identified punctate left-sided acute infarct not visible by CT. No other acute large vessel territory infarct. No acute intracranial hemorrhage. No mass lesion or midline shift. No hydrocephalus or extra-axial fluid collection. Vascular: No hyperdense vessel. Calcified atherosclerosis at the skull base. Skull: Scalp soft tissues and calvarium demonstrate no new finding. Sinuses/Orbits: Globes and orbital soft tissues within normal limits. Paranasal sinuses and mastoid air cells remain clear. Other: None. ASPECTS [facility] Stroke Program Early CT Score) - Ganglionic level infarction (caudate, lentiform nuclei, internal capsule, insula, M1-M3 cortex): 7 -  Supraganglionic infarction (M4-M6 cortex): 3 Total score (0-10 with 10 being normal): 10 IMPRESSION: 1. Stable CT. No new acute intracranial abnormality. Previously identified punctate left-sided infarct not visible by CT. 2. ASPECTS is 10. 3. Atrophy with moderate chronic microvascular ischemic disease and chronic left PCA distribution infarct. These results were communicated to Dr. Rory Percy at 8:29 pm on 05/20/2021 by text page via the [facility] messaging system. Electronically Signed   By: Jeannine Boga M.D.   On: 05/20/2021 20:30     LOS: 2 days   Antonieta Pert, MD [facility]  05/22/2021, 1:27 PM

## 2021-05-22 NOTE — Progress Notes (Signed)
Called patient daughter Idah Quizon and given update about pt's condition.

## 2021-05-23 LAB — CBC WITH DIFFERENTIAL/PLATELET
Abs Immature Granulocytes: 0.02 10*3/uL (ref 0.00–0.07)
Basophils Absolute: 0 10*3/uL (ref 0.0–0.1)
Basophils Relative: 0 %
Eosinophils Absolute: 0 10*3/uL (ref 0.0–0.5)
Eosinophils Relative: 0 %
HCT: 33.8 % — ABNORMAL LOW (ref 36.0–46.0)
Hemoglobin: 11.2 g/dL — ABNORMAL LOW (ref 12.0–15.0)
Immature Granulocytes: 1 %
Lymphocytes Relative: 42 %
Lymphs Abs: 1.2 10*3/uL (ref 0.7–4.0)
MCH: 31.2 pg (ref 26.0–34.0)
MCHC: 33.1 g/dL (ref 30.0–36.0)
MCV: 94.2 fL (ref 80.0–100.0)
Monocytes Absolute: 0.3 10*3/uL (ref 0.1–1.0)
Monocytes Relative: 11 %
Neutro Abs: 1.4 10*3/uL — ABNORMAL LOW (ref 1.7–7.7)
Neutrophils Relative %: 46 %
Platelets: 109 10*3/uL — ABNORMAL LOW (ref 150–400)
RBC: 3.59 MIL/uL — ABNORMAL LOW (ref 3.87–5.11)
RDW: 12.3 % (ref 11.5–15.5)
WBC: 2.9 10*3/uL — ABNORMAL LOW (ref 4.0–10.5)
nRBC: 0 % (ref 0.0–0.2)

## 2021-05-23 LAB — COMPREHENSIVE METABOLIC PANEL
ALT: 21 U/L (ref 0–44)
AST: 37 U/L (ref 15–41)
Albumin: 2.7 g/dL — ABNORMAL LOW (ref 3.5–5.0)
Alkaline Phosphatase: 38 U/L (ref 38–126)
Anion gap: 12 (ref 5–15)
BUN: 20 mg/dL (ref 8–23)
CO2: 22 mmol/L (ref 22–32)
Calcium: 8.8 mg/dL — ABNORMAL LOW (ref 8.9–10.3)
Chloride: 103 mmol/L (ref 98–111)
Creatinine, Ser: 0.74 mg/dL (ref 0.44–1.00)
GFR, Estimated: >60 mL/min (ref >60)
Glucose, Bld: 111 mg/dL — ABNORMAL HIGH (ref 70–99)
Potassium: 3.9 mmol/L (ref 3.5–5.1)
Sodium: 137 mmol/L (ref 135–145)
Total Bilirubin: 0.5 mg/dL (ref 0.3–1.2)
Total Protein: 6.1 g/dL — ABNORMAL LOW (ref 6.5–8.1)

## 2021-05-23 LAB — MAGNESIUM: Magnesium: 1.7 mg/dL (ref 1.7–2.4)

## 2021-05-23 LAB — PHOSPHORUS: Phosphorus: 3 mg/dL (ref 2.5–4.6)

## 2021-05-23 LAB — GLUCOSE, CAPILLARY
Glucose-Capillary: 94 mg/dL (ref 70–99)
Glucose-Capillary: 89 mg/dL (ref 70–99)
Glucose-Capillary: 91 mg/dL (ref 70–99)
Glucose-Capillary: 207 mg/dL — ABNORMAL HIGH (ref 70–99)
Glucose-Capillary: 193 mg/dL — ABNORMAL HIGH (ref 70–99)
Glucose-Capillary: 146 mg/dL — ABNORMAL HIGH (ref 70–99)

## 2021-05-23 LAB — C-REACTIVE PROTEIN: CRP: <0.5 mg/dL (ref <1.0)

## 2021-05-23 LAB — D-DIMER, QUANTITATIVE: D-Dimer, Quant: 0.31 ug/mL-FEU (ref 0.00–0.50)

## 2021-05-23 MED ORDER — ASCORBIC ACID 500 MG PO TABS: 500.0000 mg | ORAL_TABLET | Freq: Every day | ORAL | 0 refills | Status: AC

## 2021-05-23 MED ORDER — DEXAMETHASONE 6 MG PO TABS: 6.0000 mg | ORAL_TABLET | ORAL | 0 refills | Status: AC

## 2021-05-23 MED ORDER — ASPIRIN 81 MG PO TBEC: 81.0000 mg | DELAYED_RELEASE_TABLET | Freq: Every day | ORAL | 0 refills | Status: AC

## 2021-05-23 MED ORDER — DIVALPROEX SODIUM ER 500 MG PO TB24: 500.0000 mg | ORAL_TABLET | Freq: Every day | ORAL | Status: DC

## 2021-05-23 MED ORDER — ZINC SULFATE 220 (50 ZN) MG PO CAPS: 220.0000 mg | ORAL_CAPSULE | Freq: Every day | ORAL | 0 refills | Status: AC

## 2021-05-23 MED ORDER — CEPHALEXIN 500 MG PO CAPS: 500.0000 mg | ORAL_CAPSULE | Freq: Four times a day (QID) | ORAL | 0 refills | Status: AC

## 2021-05-23 NOTE — Progress Notes (Signed)
Physical Therapy Treatment Patient Details Name: Anna Hunt MRN: [id number] DOB: 1946/01/25 Today's Date: 05/23/2021   History of Present Illness The pt is a 75 yo female presenting 9/9 due to concern for stroke and episode of vomiting. MRI revealed possible punctate acute or subacute infarct in the left periventricular parietotemporal region. PMH includes: breast cancer, DM II, depression, HTN, LBBB, L occipital stroke, cerebral artery occlusion with cerebral infarction, and vertigo.    PT Comments    Pt received in supine, agreeable to therapy session and with good participation and tolerance for bed mobility and transfer training. Pt needing min to modA +1-2 for bed mobility, sit<>stand and stand pivot transfer to chair. Emphasis on postural awareness with seated/standing balance, supine/seated/standing therapeutic exercises for strengthening, safety awareness with transfers, use of assistive device and importance of OOB mobility at mealtimes and for toileting to maintain and build strength and endurance. Pt continues to benefit from PT services to progress toward functional mobility goals. Currently recommend SNF unless ALF able to accommodate pt at current level with HHPT, disposition updated per discussion with supervising PT Lynnell Jude.   Recommendations for follow up therapy are one component of a multi-disciplinary discharge planning process, led by the attending physician.  Recommendations may be updated based on patient status, additional functional criteria and insurance authorization.  Follow Up Recommendations  SNF;Supervision for mobility/OOB     Equipment Recommendations  None recommended by PT (defer to post-acute (pt has w/c))    Recommendations for Other Services       Precautions / Restrictions Precautions Precautions: Fall Precaution Comments: watch O2 Restrictions Weight Bearing Restrictions: No     Mobility  Bed Mobility Overal bed mobility: Needs  Assistance Bed Mobility: Rolling;Supine to Sit Rolling: Min assist   Supine to sit: Mod assist;HOB elevated     General bed mobility comments: minA to complete roll;modA to progress trunk to upright position;consistent cues for sequencing; pt has difficulty following cues for contralateral hip scooting in seated position.    Transfers Overall transfer level: Needs assistance Equipment used: Rolling [place] (2 wheeled) Transfers: Sit to/from [facility] Sit to Stand: Min assist;+2 safety/equipment Stand pivot transfers: Min assist;+2 safety/equipment;+2 physical assistance       General transfer comment: minA+2 for initial powerup, pt then progressing to minA+1 for sit<>stand, consistent cues for safe hand placement with transitions. Pt required minA+2 for stand-pivot with assist for stability, weight shifting, and sequencing. Pt with minimal foot clearance. She also completed sit<>stand x4 during session (from EOB then x3 reps from chair)  Ambulation/Gait Ambulation/Gait assistance: Min assist;+2 safety/equipment;Mod assist Gait Distance (Feet): 4 Feet Assistive device: Rolling [place] (2 wheeled) Gait Pattern/deviations: Step-to pattern;Decreased step length - right;Decreased step length - left;Shuffle;Narrow base of support;Leaning posteriorly Gait velocity: grossly <0.1 m/s Gait velocity interpretation: <1.8 ft/sec, indicate of risk for recurrent falls General Gait Details: pt with difficulty shifting weight to offload BLE, heavy reliance on RW support, with max cues pt able to sequence steps but needs manual assist to manage RW. posterior LOB x2 while stepping requiring modA to correct   Stairs             Wheelchair Mobility    Modified Rankin (Stroke Patients Only) Modified Rankin (Stroke Patients Only) Pre-Morbid Rankin Score: Moderately severe disability Modified Rankin: Severe disability     Balance Overall balance assessment: Needs  assistance Sitting-balance support: Single extremity supported;Feet supported Sitting balance-Leahy Scale: Fair Sitting balance - Comments: min guard for safety with seated  weight shifting   Standing balance support: Bilateral upper extremity supported;During functional activity Standing balance-Leahy Scale: Poor Standing balance comment: heavy reliance on BUE support of RW and external assist needed for dynamic standing tasks                            Cognition Arousal/Alertness: Awake/alert Behavior During Therapy: Flat affect Overall Cognitive Status: Impaired/Different from baseline Area of Impairment: Attention;Following commands;Memory;Safety/judgement;Awareness;Problem solving                   Current Attention Level: Focused Memory: Decreased recall of precautions;Decreased short-term memory Following Commands: Follows one step commands with increased time;Follows one step commands inconsistently Safety/Judgement: Decreased awareness of safety;Decreased awareness of deficits Awareness: Emergent Problem Solving: Slow processing;Difficulty sequencing;Decreased initiation;Requires verbal cues General Comments: Pt demonstrates short term memory limitations requiring consistent cues for safe hand placement during transfers. She requires increased time for processing information and requires cues for sequencing during stand-pivot transfer.      Exercises Other Exercises Other Exercises: marches in standing x10 Other Exercises: supine BLE AROM: ankle pumps, heel slides x5-10 reps ea Other Exercises: STS x3 reciprocal reps for strengthening Other Exercises: use of IS x10 reps, pt achieves 250-600 mL, encouraged hourly use    General Comments General comments (skin integrity, edema, etc.): VSS on 3L O2 [place] with exertion, HR WFL (60-70's bpm)      Pertinent Vitals/Pain Pain Assessment: No/denies pain    Home Living                      Prior  Function            PT Goals (current goals can now be found in the care plan section) Acute Rehab PT Goals Patient Stated Goal: to get stronger PT Goal Formulation: With patient Time For Goal Achievement: 06/03/21 Potential to Achieve Goals: Fair Progress towards PT goals: Progressing toward goals    Frequency    Min 3X/week      PT Plan Current plan remains appropriate    Co-evaluation PT/OT/SLP Co-Evaluation/Treatment: Yes Reason for Co-Treatment: Complexity of the patient's impairments (multi-system involvement);Necessary to address cognition/behavior during functional activity;For patient/therapist safety;To address functional/ADL transfers PT goals addressed during session: Mobility/safety with mobility;Balance;Proper use of DME;Strengthening/ROM OT goals addressed during session: ADL's and self-care      AM-PAC PT "6 Clicks" Mobility   Outcome Measure  Help needed turning from your back to your side while in a flat bed without using bedrails?: A Little Help needed moving from lying on your back to sitting on the side of a flat bed without using bedrails?: A Lot Help needed moving to and from a bed to a chair (including a wheelchair)?: A Lot Help needed standing up from a chair using your arms (e.g., wheelchair or bedside chair)?: A Little Help needed to walk in hospital room?: A Lot Help needed climbing 3-5 steps with a railing? : Total 6 Click Score: 13    End of Session Equipment Utilized During Treatment: Gait belt;Oxygen Activity Tolerance: Patient tolerated treatment well Patient left: in chair;with call bell/phone within reach;with chair alarm set Nurse Communication: Mobility status;Other (comment) (pt IV beeping) PT Visit Diagnosis: Other abnormalities of gait and mobility (R26.89);Muscle weakness (generalized) (M62.81)     Time: [id number] PT Time Calculation (min) (ACUTE ONLY): 32 min  Charges:  $Therapeutic Exercise: 8-22 mins  Houston Siren., PTA Acute Rehabilitation Services Pager: [phone] Office: [place] 05/23/2021, 1:30 PM

## 2021-05-23 NOTE — Progress Notes (Signed)
Occupational Therapy Treatment Patient Details Name: JHOVANA NELL MRN: [id number] DOB: 1945-12-03 Today's Date: 05/23/2021   History of present illness The pt is a 75 yo female presenting 9/9 due to concern for stroke and episode of vomiting. MRI revealed possible punctate acute or subacute infarct in the left periventricular parietotemporal region. PMH includes: breast cancer, DM II, depression, HTN, LBBB, L occipital stroke, cerebral artery occlusion with cerebral infarction, and vertigo.   OT comments  Pt received in bed, agreeable to OT/PT session. Pt required modA for progression to EOB. Pt was saturated due to purwic not working, she required modA for posterior and pericare. Pt required minA+2 for sit<>stand and stand-pivot transfer. She required max cues for sequencing and safe hand placement. Pt will continue to benefit from skilled OT services to maximize safety and independence with ADL/IADL and functional mobility. Will continue to follow acutely and progress as tolerated.     Recommendations for follow up therapy are one component of a multi-disciplinary discharge planning process, led by the attending physician.  Recommendations may be updated based on patient status, additional functional criteria and insurance authorization.    Follow Up Recommendations  SNF    Equipment Recommendations   (defer to post acute rehab setting.)    Recommendations for Other Services      Precautions / Restrictions Precautions Precautions: Fall Precaution Comments: watch O2 Restrictions Weight Bearing Restrictions: No       Mobility Bed Mobility Overal bed mobility: Needs Assistance Bed Mobility: Rolling;Supine to Sit Rolling: Min assist   Supine to sit: Mod assist;HOB elevated     General bed mobility comments: minA to complete roll;modA to progress trunk to upright position;consistent cues for sequencing    Transfers Overall transfer level: Needs assistance Equipment used:  Rolling [place] (2 wheeled) Transfers: Sit to/from [facility] Sit to Stand: Min assist;+2 safety/equipment Stand pivot transfers: Min assist;+2 safety/equipment;+2 physical assistance       General transfer comment: minA+2 for initial powerup, pt then progressing to minA+1 for sit<>stand, consistent cues for sae hand placement with transitions. Pt required minA+2 for stand-pivot with assist for stability, weight shifting, and sequencing. Pt with minimal foot clearance. completed sit<>stand x4 during session    Balance Overall balance assessment: Needs assistance Sitting-balance support: Single extremity supported;Feet supported Sitting balance-Leahy Scale: Fair Sitting balance - Comments: minguard for safety   Standing balance support: Bilateral upper extremity supported;During functional activity Standing balance-Leahy Scale: Poor Standing balance comment: heavy reliance on  BUE support on rW                           ADL either performed or assessed with clinical judgement   ADL Overall ADL's : Needs assistance/impaired     Grooming: Minimal assistance               Lower Body Dressing: Moderate assistance;Sit to/from stand Lower Body Dressing Details (indicate cue type and reason): modA to access BLE;minA+2 for safety and assistance Toilet Transfer: Minimal assistance;Stand-pivot Toilet Transfer Details (indicate cue type and reason): simulated from EOB to recliner pt required heavy cueing for sequencing pivot and for safe hand placement Toileting- Clothing Manipulation and Hygiene: Moderate assistance Toileting - Clothing Manipulation Details (indicate cue type and reason): modA for posterior care     Functional mobility during ADLs: Minimal assistance;+2 for physical assistance;+2 for safety/equipment;Cueing for sequencing;Rolling [place] General ADL Comments: pt limited by decreased activity tolerane, cognition, instability and generalized  weakness     Vision   Vision Assessment?: Yes Tracking/Visual Pursuits: Decreased smoothness of eye movement to RIGHT inferior field;Decreased smoothness of eye movement to LEFT inferior field Additional Comments: continue to assess   Perception     Praxis      Cognition Arousal/Alertness: Awake/alert Behavior During Therapy: Flat affect Overall Cognitive Status: Impaired/Different from baseline Area of Impairment: Attention;Following commands;Memory;Safety/judgement;Awareness;Problem solving                   Current Attention Level: Focused Memory: Decreased recall of precautions;Decreased short-term memory Following Commands: Follows one step commands with increased time;Follows one step commands inconsistently Safety/Judgement: Decreased awareness of safety;Decreased awareness of deficits Awareness: Emergent Problem Solving: Slow processing;Difficulty sequencing;Decreased initiation;Requires verbal cues General Comments: Pt demonstrates short term memory limitations requiring consistent cues for safe hand placement during transfers. She requires increased time for processing information and requires cues for sequencing during stand-pivot transfer.        Exercises Exercises: Other exercises Other Exercises Other Exercises: marches in standing x10   Shoulder Instructions       General Comments vss on 3lnc    Pertinent Vitals/ Pain       Pain Assessment: No/denies pain  Home Living                                          Prior Functioning/Environment              Frequency  Min 2X/week        Progress Toward Goals  OT Goals(current goals can now be found in the care plan section)  Progress towards OT goals: Progressing toward goals  Acute Rehab OT Goals Patient Stated Goal: to get stronger OT Goal Formulation: With patient Time For Goal Achievement: 06/04/21 Potential to Achieve Goals: Fair ADL Goals Pt Will Perform  Grooming: with set-up;sitting Pt Will Perform Upper Body Dressing: with set-up;sitting Pt Will Perform Lower Body Dressing: sit to/from stand;with adaptive equipment;with min guard assist  Plan Discharge plan remains appropriate    Co-evaluation    PT/OT/SLP Co-Evaluation/Treatment: Yes Reason for Co-Treatment: Complexity of the patient's impairments (multi-system involvement);For patient/therapist safety;To address functional/ADL transfers   OT goals addressed during session: ADL's and self-care      AM-PAC OT "6 Clicks" Daily Activity     Outcome Measure   Help from another person eating meals?: A Little Help from another person taking care of personal grooming?: A Little Help from another person toileting, which includes using toliet, bedpan, or urinal?: A Lot Help from another person bathing (including washing, rinsing, drying)?: A Lot Help from another person to put on and taking off regular upper body clothing?: A Little Help from another person to put on and taking off regular lower body clothing?: A Lot 6 Click Score: 15    End of Session Equipment Utilized During Treatment: Rolling [place];Oxygen  OT Visit Diagnosis: Unsteadiness on feet (R26.81);Muscle weakness (generalized) (M62.81)   Activity Tolerance Patient tolerated treatment well   Patient Left with call bell/phone within reach;in chair;with chair alarm set   Nurse Communication Mobility status        Time: [id number] OT Time Calculation (min): 31 min  Charges: OT General Charges $OT Visit: 1 Visit OT Treatments $Self Care/Home Management : 8-22 mins  Helene Kelp OTR/L Acute Rehabilitation Services Office: [place] 05/23/2021, 12:41 PM

## 2021-05-23 NOTE — Progress Notes (Signed)
RN attempted to give report to the facility  twice. RN was instructed that they will need to call back in the morning and speak with the supervisor.

## 2021-05-23 NOTE — NC FL2 (Addendum)
[place] MEDICAID FL2 LEVEL OF CARE SCREENING TOOL     IDENTIFICATION  Patient Name: Anna Hunt Birthdate: [birth year] Sex: female Admission Date (Current Location): 05/19/2021  [facility] and [place] Number:  Herbalist and Address:  [facility], [place] [address] [place], [place] [address]      Provider Number: [id number]  Attending Physician Name and Address:  Antonieta Pert, MD  Relative Name and Phone Number:       Current Level of Care: Hospital Recommended Level of Care: [place] Prior Approval Number:    Date Approved/Denied:   PASRR Number:    Discharge Plan: Other (Comment) (ALF)    Current Diagnoses: Patient Active Problem List   Diagnosis Date Noted   COVID-19 virus infection 05/20/2021   Acute CVA (cerebrovascular accident) ([place]) 05/19/2021   Decreased GFR 05/19/2021   History of depression    Hyperlipidemia    Malnutrition of moderate degree 11/05/2020   Adult failure to thrive 11/02/2020   Type 2 diabetes mellitus with hyperglycemia ([place]) 11/02/2020   Normocytic anemia 11/02/2020   Acute UTI (urinary tract infection) 11/02/2020   Mild protein-calorie malnutrition ([place]) 11/02/2020   Failure to thrive in adult 11/02/2020   Urinary incontinence 10/11/2020   Cervical myelopathy ([place]) 06/24/2020   Genetic testing 06/23/2020   Cerebrovascular accident (CVA) ([place]) 06/23/2020   Gait abnormality 06/16/2020   Malignant neoplasm of upper-inner quadrant of left breast in female, estrogen receptor positive ([place]) 06/10/2020   TIA (transient ischemic attack) 11/16/2018   HTN (hypertension), benign 11/16/2018   LBBB (left bundle branch block)    Diabetes mellitus without complication ([place])    Stroke ([place]) 01/22/2013   Displacement of cervical intervertebral disc without myelopathy     Orientation RESPIRATION BLADDER Height & Weight     Self, Time, Place  O2 ([place] 2L) Continent Weight: 170 lb (77.1 kg) Height:  5'  2" (157.5 cm)  BEHAVIORAL SYMPTOMS/MOOD NEUROLOGICAL BOWEL NUTRITION STATUS      Continent Diet (see DC summary)  AMBULATORY STATUS COMMUNICATION OF NEEDS Skin   Extensive Assist Verbally Normal                       Personal Care Assistance Level of Assistance  Bathing, Feeding, Dressing Bathing Assistance: Maximum assistance Feeding assistance: Limited assistance Dressing Assistance: Maximum assistance     Functional Limitations Info             SPECIAL CARE FACTORS FREQUENCY                       Contractures Contractures Info: Not present    Additional Factors Info  Code Status, Allergies, Psychotropic, Insulin Sliding Scale, Isolation Precautions Code Status Info: DNR Allergies Info: Niaspan (Niacin Er) Psychotropic Info: Paxil '20mg'$  daily at bed Insulin Sliding Scale Info: see DC summary Isolation Precautions Info: COVID-19: airborne/contact precautions     Current Medications (05/23/2021):  This is the current hospital active medication list Current Facility-Administered Medications  Medication Dose Route Frequency Provider Last Rate Last Admin    stroke: mapping our early stages of recovery book   Does not apply Once Reubin Milan, MD       acetaminophen (TYLENOL) tablet 650 mg  650 mg Oral Q6H PRN Reubin Milan, MD   650 mg at 05/20/21 1111   Or   acetaminophen (TYLENOL) suppository 650 mg  650 mg Rectal Q6H PRN Reubin Milan,  MD       albuterol (VENTOLIN HFA) 108 (90 Base) MCG/ACT inhaler 2 puff  2 puff Inhalation Q6H PRN Kc, Ramesh, MD       anastrozole (ARIMIDEX) tablet 1 mg  1 mg Oral Daily Reubin Milan, MD   1 mg at 05/23/21 1014   ascorbic acid (VITAMIN C) tablet 500 mg  500 mg Oral Daily Reubin Milan, MD   500 mg at 05/23/21 1014   aspirin EC tablet 81 mg  81 mg Oral Daily [place], Kofi, MD   81 mg at 05/23/21 1014   cefTRIAXone (ROCEPHIN) 1 g in sodium chloride 0.9 % 100 mL IVPB  1 g Intravenous Q24H Kc, Ramesh,  MD 200 mL/hr at 05/22/21 1048 1 g at 05/22/21 1048   clopidogrel (PLAVIX) tablet 75 mg  75 mg Oral Daily Reubin Milan, MD   75 mg at 05/23/21 1014   dexamethasone (DECADRON) tablet 6 mg  6 mg Oral Q24H Kc, Ramesh, MD   6 mg at 05/23/21 1014   divalproex (DEPAKOTE ER) 24 hr tablet 500 mg  500 mg Oral Daily [place], Trey Sailors, MD   500 mg at 05/23/21 1014   And   divalproex (DEPAKOTE ER) 24 hr tablet 500 mg  500 mg Oral QHS [place], Kofi, MD   500 mg at 05/22/21 2235   enoxaparin (LOVENOX) injection 40 mg  40 mg Subcutaneous Q24H Reubin Milan, MD   40 mg at 05/22/21 2235   guaiFENesin-dextromethorphan (ROBITUSSIN DM) 100-10 MG/5ML syrup 10 mL  10 mL Oral Q4H PRN Reubin Milan, MD       insulin aspart (novoLOG) injection 0-15 Units  0-15 Units Subcutaneous TID WC Reubin Milan, MD   5 Units at 05/22/21 1627   levothyroxine (SYNTHROID) tablet 25 mcg  25 mcg Oral q morning Reubin Milan, MD   25 mcg at 05/23/21 0655   LORazepam (ATIVAN) injection 1 mg  1 mg Intravenous Once PRN Reubin Milan, MD       ondansetron [facility]) tablet 4 mg  4 mg Oral Q6H PRN Reubin Milan, MD       Or   ondansetron [facility]) injection 4 mg  4 mg Intravenous Q6H PRN Reubin Milan, MD       PARoxetine (PAXIL) tablet 20 mg  20 mg Oral QHS Reubin Milan, MD   20 mg at 05/22/21 2235   simvastatin (ZOCOR) tablet 40 mg  40 mg Oral QHS Reubin Milan, MD   40 mg at 05/22/21 2235   traMADol (ULTRAM) tablet 50 mg  50 mg Oral Q8H PRN Reubin Milan, MD       zinc sulfate capsule 220 mg  220 mg Oral Daily Reubin Milan, MD   220 mg at 05/23/21 1014     Discharge Medications: acetaminophen 500 MG tablet Commonly known as: TYLENOL Take 1,000 mg by mouth every 6 (six) hours as needed for mild pain.    anastrozole 1 MG tablet Commonly known as: ARIMIDEX Take 1 tablet (1 mg total) by mouth daily.    ascorbic acid 500 MG tablet Commonly known as: VITAMIN C Take 1  tablet (500 mg total) by mouth daily for 14 days. Start taking on: May 24, 2021    aspirin 81 MG EC tablet Take 1 tablet (81 mg total) by mouth daily for 21 days. Swallow whole. Start taking on: May 24, 2021    BD Pen Needle Nano U/F 32G  X 4 MM Misc Generic drug: Insulin Pen Needle    cephALEXin 500 MG capsule Commonly known as: KEFLEX Take 1 capsule (500 mg total) by mouth 4 (four) times daily for 2 days.    cholecalciferol 25 MCG (1000 UNIT) tablet Commonly known as: VITAMIN D3 Take 1,000 Units by mouth every morning.    clopidogrel 75 MG tablet Commonly known as: PLAVIX Take 1 tablet (75 mg total) by mouth daily.    dexamethasone 6 MG tablet Commonly known as: DECADRON Take 1 tablet (6 mg total) by mouth daily for 7 doses. Start taking on: May 24, 2021    divalproex 500 MG 24 hr tablet Commonly known as: DEPAKOTE ER Take 1 tablet (500 mg total) by mouth at bedtime. What changed: You were already taking a medication with the same name, and this prescription was added. Make sure you understand how and when to take each.    divalproex 500 MG 24 hr tablet Commonly known as: DEPAKOTE ER Take 1 tablet (500 mg total) by mouth daily. Start taking on: May 24, 2021 What changed:  medication strength how much to take when to take this additional instructions    furosemide 20 MG tablet Commonly known as: LASIX Take 20 mg by mouth every morning.    levothyroxine 25 MCG tablet Commonly known as: SYNTHROID Take 25 mcg by mouth every morning.    metFORMIN 500 MG 24 hr tablet Commonly known as: GLUCOPHAGE-XR Take 500 mg by mouth at bedtime.    multivitamin with minerals Tabs tablet Take 1 tablet by mouth every morning.    NovoLOG FlexPen 100 UNIT/ML FlexPen Generic drug: insulin aspart Inject 0-15 Units into the skin See admin instructions. Inject 0-15 units subcutaneously three times daily before meals and at bedtime per sliding scale: CBG 70-100  0 units, 151-250 2 units, 151 2-- 3 units, 201-250 5 units, 251-300 8 units, 301-350 11 units, 351-400 15 units, >400 call MD    PARoxetine 20 MG tablet Commonly known as: PAXIL Take 20 mg by mouth at bedtime.    simvastatin 40 MG tablet Commonly known as: ZOCOR Take 40 mg by mouth at bedtime.    telmisartan 20 MG tablet Commonly known as: MICARDIS Take 20 mg by mouth every morning.    traMADol 50 MG tablet Commonly known as: ULTRAM Take 1 tablet (50 mg total) by mouth every 6 (six) hours as needed. What changed:  when to take this reasons to take this    zinc sulfate 220 (50 Zn) MG capsule Take 1 capsule (220 mg total) by mouth daily for 14 days. Start taking on: May 24, 2021      Relevant Imaging Results:  Relevant Lab Results:   Additional Information SS#: [id number]  Geralynn Ochs, LCSW

## 2021-05-23 NOTE — Progress Notes (Signed)
Called ED social worker what my next step to do for patient who is transferring to the facility when the nurse told the day shift nurse to call the supervisor in the morning, without getting report, she told me to let the MD know. The charge nurse told me since it has already been arranged for patient to go, [facility] will come pick her up as scheduled to the facility.

## 2021-05-23 NOTE — Social Work (Signed)
ESCSW called [place] and spoke with Vivien Rota, the med tech on duty.  Per Vivien Rota, she has no idea what is happening with Pt and cannot confirm that Pt is slated to return to facility this evening.  CSW voiced that Rojelio Brenner was made aware that Pt would arrive this evening, Vivien Rota reports that she does not know what Misty's position is within organization. Vivien Rota reiterates what CSW was told by floor RN that Pt cannot be transferred tonight and that facility will have to be called tomorrow morning to begin process anew.

## 2021-05-23 NOTE — Discharge Summary (Addendum)
Physician Discharge Summary  HOMER MULLIKIN [id number] DOB: Jan 02, 1946 DOA: 05/19/2021  PCP: Lawerance Cruel, MD  Admit date: 05/19/2021 Discharge date: 05/23/2021  Admitted From: ALF Disposition:  ALF W hospice  Recommendations for Outpatient Follow-up:  Follow up with PCP in 1-2 weeks  Home Health:no  Equipment/Devices: noe  Discharge Condition: Stable Code Status:   Code Status: DNR Diet recommendation:  Diet Order             Diet Carb Modified Fluid consistency: Thin; Room service appropriate? 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  Patient was able to answer simple questions. Patient seen in the ED,temperature 98.2 F, pulse 98, respiration 16, BP 130/83 mmHg O2 sat 94% on room air.  Blood work normal troponin lipase stable CBC, covid+ Cxr-did not show any active disease.  CT head without contrast did not show any acute intracranial process.  There were stable chronic ischemic changes throughout the white matter and left occipital lobe.  MRI brain with without contrast showed possible punctate acute or subacute infarct in the left periventricular parietotemporal region.  There are remote left temporo-occipital and right cerebellar infarcts with moderate to outlines chronic microvascular ischemic change in disease. Neurology was consulted and patient admitted-for acute encephalopathy, subacute stroke.  Patient had fever spike in the ED and UA consistent with UTI, and also hypoxic needing  supplemental oxygen for COVID. 9/10 pm-rapid response called/code stroke due to increased facial droop and or difficulty seen by neurology-stat CT head stable, no intervention done. Patient was hypoxic treated with remdesivir Decadron and supplemental oxygen.  Seen by palliative care changed to DNR and after further discussion decision was made to discharge her back to ALF with hospice care.  At this time she is back to baseline mental status with MCI-mild cognitive impairment. 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 UDS negative CT head/MRI brain with subacute stroke. b12, tsh normal, depakote Level at 37- dose increased to 500 mg in am and  cont home 500 mg bedtime .EEG shows mild to moderate diffuse encephalopathy.Mental status improved/at baseline continue supportive care delirium precaution. .  Subacute stroke-punctate infarcts seen on MRI Rapid response overnight 9/10 for facial droop, dysarthria CT head is stable and no intervention needed Stroke work up: LDL stable at 74, follow-up HbA1c 5.3 Neurology following, completed stroke work-up  as belwo per neuro: - Frequent neuro checks - Echocardiogram- EF 60-65%, no R WMA G1 DD -Neurology started on dual antiplatelets x3 weeks then revert back to Plavix after that.  Continue statins. - Risk factor modification - Telemetry monitoring; and cardiac monitor - Blood pressure goal   - Normotension  - Management of hypertensive emergency/urgency per standard protocols  -  Permissive hypertension to 220/120 . PT OT SLP to continue.  Acute hypoxic respiratory failure likely from COVID   COVID-positive: chest x-ray clear, D-dimer /CRP stable with.  Continue Decadronx10 days, remdesivir x 5 days, supplemental oxygen and monitor inflammatory markers which has been pretty stable.Keep on isolation for 21 days since onset of disease or positive day which ever is earlier. Tested + on 05/19/21 Recent Labs    05/20/21 1313 05/21/21 0244 05/22/21 0349 05/23/21 0351  DDIMER <0.27 0.72* 0.33 0.31  FERRITIN 126  --   --   --   CRP 0.5 <0.5 0.7 <0.5   E. coli UTI POA-pansensitive, switch to po keflex on d/c to complete 5 days  Neutropenia/thrombocytopenia/anemia-likely due to COVID infection. B12 level is normal, counts are trending up.  Recheck in 5 to 7 days fat the facility. Recent Labs  Lab 05/21/21 0244 05/22/21 0349 05/23/21 0351  HGB 9.9* 10.2* 11.2*  HCT 29.9* 31.3* 33.8*  WBC 2.1* 2.2* 2.9*  PLT 101* 98* 109*     HTN:BP stable.  Malignant neoplasm of upper-inner quadrant of left breast in female, estrogen receptor positive: Continue her anastrozole.  Type 2 diabetes mellitus with hyperglycemia. stable hb A1c, blood sugar at goal, continue sliding scale insulin.Hold metformin for now-resume at snf. 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   Normocytic anemia:stable:Monitor HB. Recent Labs  Lab 05/19/21 1920 05/20/21 1313 05/21/21 0244 05/22/21 0349 05/23/21 0351  HGB 11.6* 10.3* 9.9* 10.2* 11.2*  HCT 34.0* 31.0* 29.9* 31.3* 33.8*     History of depression:Mood is stable. Cont her multiple home meds.  Hyperlipidemia:Stable lipid panel, continue simvastatin.  Subconjunctival hemorrhage on left eye monitor.  Asymptomatic.  She is on antiplatelets dual,.  Continue to monitor.    [id number] is POA.  Palliative care input appreciated now DNR.  Discussed plan of care with daughter how is  in agreement.  Consults: neurology  Subjective: Alert awake interactive follows commands appropriately. Discharge Exam: Vitals:   05/23/21 1012 05/23/21 1141  BP: (!) 148/73 (!) 155/71  Pulse: 60 (!) 59  Resp:  19  Temp:  99.3 F (37.4 C)  SpO2:  100%   General: Pt is alert, awake, not in acute distress Cardiovascular: RRR, S1/S2 +, no rubs, no gallops Respiratory: CTA bilaterally, no wheezing, no rhonchi Abdominal: Soft, NT, ND, bowel sounds + Extremities: no edema, no cyanosis  Discharge Instructions  Discharge Instructions     Ambulatory referral to Neurology   Complete by: As directed    An appointment is requested in approximately: 2 weeks   Discharge instructions   Complete by: As directed    Follow-up with hospice [facility] upon discharge.  Follow-up with PCP.  Please call call MD or return to ER for similar or worsening recurring problem that brought you to hospital or if any fever,nausea/vomiting,abdominal pain, uncontrolled pain, chest pain,  shortness of breath or any other alarming symptoms.  Please follow-up your doctor as instructed in a week time and call the office for appointment.  Please avoid alcohol, smoking, or any other illicit substance and maintain healthy habits including taking your regular medications as prescribed.  You were cared for by a hospitalist during your hospital stay. If you have any questions about your discharge medications or the care you received while you were in the hospital after you  are discharged, you can call the unit and ask to speak with the hospitalist on call if the hospitalist that took care of you is not available.  Once you are discharged, your primary care physician will handle any further medical issues. Please note that NO REFILLS for any discharge medications will be authorized once you are discharged, as it is imperative that you return to your primary care physician (or establish a relationship with a primary care  physician if you do not have one) for your aftercare needs so that they can reassess your need for medications and monitor your lab values.   Continue isolation  least 21 days since symptom onset or positive test day which ever is earlier. Tested + on 05/19/21    Directions for you at home:  Wear a facemask - For the next 2 weeks  You should wear a facemask that covers your nose and mouth when you are in the same room with other people and when you visit a healthcare provider. People who live with or visit you should also wear a facemask while they are in the same room with you.  Separate yourself from other people in your home As much as possible, you should stay in a different room from other people in your home. Also, you should use a separate bathroom, if available.  Avoid sharing household items You should not share dishes, drinking glasses, cups, eating utensils, towels, bedding, or other items with other people in your home. After using these items, you should wash them thoroughly with soap and water.  Cover your coughs and sneezes Cover your mouth and nose with a tissue when you cough or sneeze, or you can cough or sneeze into your sleeve. Throw used tissues in a lined trash can, and immediately wash your hands with soap and water for at least 20 seconds or use an alcohol-based hand rub.  Wash your [facility] your hands often and thoroughly with soap and water for at least 20 seconds. You can use an alcohol-based hand sanitizer if soap and water are not available and if your hands are not visibly dirty. Avoid touching your eyes, nose, and mouth with unwashed hands.  Directions for those who live with, or provide care at home for you:  Limit the number of people who have contact with the patient If possible, have only one caregiver for the patient. Other household members should stay in another home or place of residence. If this is not possible, they should stay in another  room, or be separated from the patient as much as possible. Use a separate bathroom, if available. Restrict visitors who do not have an essential need to be in the home.  Ensure good ventilation Make sure that shared spaces in the home have good air flow, such as from an air conditioner or an opened window, weather permitting.  Wash your hands often Wash your hands often and thoroughly with soap and water for at least 20 seconds. You can use an alcohol based hand sanitizer if soap and water are not available and if your hands are not visibly dirty. Avoid touching your eyes, nose, and mouth with unwashed hands. Use disposable paper towels to dry your hands. If not available, use dedicated cloth towels and replace them when they become wet.  Wear a facemask and gloves Wear a disposable facemask at all times in the room and gloves when you touch or have contact with the patient's blood, body fluids, and/or secretions  or excretions, such as sweat, saliva, sputum, nasal mucus, vomit, urine, or feces.  Ensure the mask fits over your nose and mouth tightly, and do not touch it during use. Throw out disposable facemasks and gloves after using them. Do not reuse. Wash your hands immediately after removing your facemask and gloves. If your personal clothing becomes contaminated, carefully remove clothing and launder. Wash your hands after handling contaminated clothing. Place all used disposable facemasks, gloves, and other waste in a lined container before disposing them with other household waste. Remove gloves and wash your hands immediately after handling these items.  Do not share dishes, glasses, or other household items with the patient Avoid sharing household items. You should not share dishes, drinking glasses, cups, eating utensils, towels, bedding, or other items with a patient who is confirmed to have, or being evaluated for, COVID-19 infection. After the person uses these items, you should  wash them thoroughly with soap and water.  Wash laundry thoroughly Immediately remove and wash clothes or bedding that have blood, body fluids, and/or secretions or excretions, such as sweat, saliva, sputum, nasal mucus, vomit, urine, or feces, on them. Wear gloves when handling laundry from the patient. Read and follow directions on labels of laundry or clothing items and detergent. In general, wash and dry with the warmest temperatures recommended on the label.  Clean all areas the individual has used often Clean all touchable surfaces, such as counters, tabletops, doorknobs, bathroom fixtures, toilets, phones, keyboards, tablets, and bedside tables, every day. Also, clean any surfaces that may have blood, body fluids, and/or secretions or excretions on them. Wear gloves when cleaning surfaces the patient has come in contact with. Use a diluted bleach solution (e.g., dilute bleach with 1 part bleach and 10 parts water) or a household disinfectant with a label that says EPA-registered for coronaviruses. To make a bleach solution at home, add 1 tablespoon of bleach to 1 quart (4 cups) of water. For a larger supply, add  cup of bleach to 1 gallon (16 cups) of water. Read labels of cleaning products and follow recommendations provided on product labels. Labels contain instructions for safe and effective use of the cleaning product including precautions you should take when applying the product, such as wearing gloves or eye protection and making sure you have good ventilation during use of the product. Remove gloves and wash hands immediately after cleaning.  Monitor yourself for signs and symptoms of illness Caregivers and household members are considered close contacts, should monitor their health, and will be asked to limit movement outside of the home to the extent possible. Follow the monitoring steps for close contacts listed on the symptom monitoring form.   Increase activity slowly    Complete by: As directed       Allergies as of 05/23/2021       Reactions   Niaspan [niacin Er] Hives, Swelling        Medication List     TAKE these medications    acetaminophen 500 MG tablet Commonly known as: TYLENOL Take 1,000 mg by mouth every 6 (six) hours as needed for mild pain.   anastrozole 1 MG tablet Commonly known as: ARIMIDEX Take 1 tablet (1 mg total) by mouth daily.   ascorbic acid 500 MG tablet Commonly known as: VITAMIN C Take 1 tablet (500 mg total) by mouth daily for 14 days. Start taking on: May 24, 2021   aspirin 81 MG EC tablet Take 1 tablet (81 mg total) by  mouth daily for 21 days. Swallow whole. Start taking on: May 24, 2021   BD Pen Needle Nano U/F 32G X 4 MM Misc Generic drug: Insulin Pen Needle   cephALEXin 500 MG capsule Commonly known as: KEFLEX Take 1 capsule (500 mg total) by mouth 4 (four) times daily for 2 days.   cholecalciferol 25 MCG (1000 UNIT) tablet Commonly known as: VITAMIN D3 Take 1,000 Units by mouth every morning.   clopidogrel 75 MG tablet Commonly known as: PLAVIX Take 1 tablet (75 mg total) by mouth daily.   dexamethasone 6 MG tablet Commonly known as: DECADRON Take 1 tablet (6 mg total) by mouth daily for 7 doses. Start taking on: May 24, 2021   divalproex 500 MG 24 hr tablet Commonly known as: DEPAKOTE ER Take 1 tablet (500 mg total) by mouth at bedtime. What changed: You were already taking a medication with the same name, and this prescription was added. Make sure you understand how and when to take each.   divalproex 500 MG 24 hr tablet Commonly known as: DEPAKOTE ER Take 1 tablet (500 mg total) by mouth daily. Start taking on: May 24, 2021 What changed:  medication strength how much to take when to take this additional instructions   furosemide 20 MG tablet Commonly known as: LASIX Take 20 mg by mouth every morning.   levothyroxine 25 MCG tablet Commonly known as:  SYNTHROID Take 25 mcg by mouth every morning.   metFORMIN 500 MG 24 hr tablet Commonly known as: GLUCOPHAGE-XR Take 500 mg by mouth at bedtime.   multivitamin with minerals Tabs tablet Take 1 tablet by mouth every morning.   NovoLOG FlexPen 100 UNIT/ML FlexPen Generic drug: insulin aspart Inject 0-15 Units into the skin See admin instructions. Inject 0-15 units subcutaneously three times daily before meals and at bedtime per sliding scale: CBG 70-100 0 units, 151-250 2 units, 151 2-- 3 units, 201-250 5 units, 251-300 8 units, 301-350 11 units, 351-400 15 units, >400 call MD   PARoxetine 20 MG tablet Commonly known as: PAXIL Take 20 mg by mouth at bedtime.   simvastatin 40 MG tablet Commonly known as: ZOCOR Take 40 mg by mouth at bedtime.   telmisartan 20 MG tablet Commonly known as: MICARDIS Take 20 mg by mouth every morning.   traMADol 50 MG tablet Commonly known as: ULTRAM Take 1 tablet (50 mg total) by mouth every 6 (six) hours as needed. What changed:  when to take this reasons to take this   zinc sulfate 220 (50 Zn) MG capsule Take 1 capsule (220 mg total) by mouth daily for 14 days. Start taking on: May 24, 2021        Follow-up Information     Lawerance Cruel, MD Follow up in 1 week(s).   Specialty: Family Medicine Contact information: [id number] [place] [address] [phone]                Allergies  Allergen Reactions   Niaspan [Niacin Er] Hives and Swelling    The results of significant diagnostics from this hospitalization (including imaging, microbiology, ancillary and laboratory) are listed below for reference.    Microbiology: Recent Results (from the past 240 hour(s))  Resp Panel by RT-PCR (Flu A&B, Covid) Nasopharyngeal Swab     Status: Abnormal   Collection Time: 05/19/21  8:34 PM   Specimen: Nasopharyngeal Swab; Nasopharyngeal(NP) swabs in vial transport medium  Result Value Ref Range Status   SARS  Coronavirus  2 by RT PCR POSITIVE (A) NEGATIVE Final    Comment: RESULT CALLED TO, READ BACK BY AND VERIFIED WITH: RN BRENDA MICHAELSON 05/19/21'@23'$ :20 BY TW (NOTE) SARS-CoV-2 target nucleic acids are DETECTED.  The SARS-CoV-2 RNA is generally detectable in upper respiratory specimens during the acute phase of infection. Positive results are indicative of the presence of the identified virus, but do not rule out bacterial infection or co-infection with other pathogens not detected by the test. Clinical correlation with patient history and other diagnostic information is necessary to determine patient infection status. The expected result is Negative.  Fact Sheet for Patients: EntrepreneurPulse.com.au  Fact Sheet for Healthcare Providers: IncredibleEmployment.be  This test is not yet approved or cleared by the [place] FDA and  has been authorized for detection and/or diagnosis of SARS-CoV-2 by FDA under an Emergency Use Authorization (EUA).  This EUA will remain in effect (meaning this test  can be used) for the duration of  the COVID-19 declaration under Section 564(b)(1) of the Act, 21 U.S.C. section 360bbb-3(b)(1), unless the authorization is terminated or revoked sooner.     Influenza A by PCR NEGATIVE NEGATIVE Final   Influenza B by PCR NEGATIVE NEGATIVE Final    Comment: (NOTE) The Xpert Xpress SARS-CoV-2/FLU/RSV plus assay is intended as an aid in the diagnosis of influenza from Nasopharyngeal swab specimens and should not be used as a sole basis for treatment. Nasal washings and aspirates are unacceptable for Xpert Xpress SARS-CoV-2/FLU/RSV testing.  Fact Sheet for Patients: EntrepreneurPulse.com.au  Fact Sheet for Healthcare Providers: IncredibleEmployment.be  This test is not yet approved or cleared by the [place] FDA and has been authorized for detection and/or diagnosis of SARS-CoV-2  by FDA under an Emergency Use Authorization (EUA). This EUA will remain in effect (meaning this test can be used) for the duration of the COVID-19 declaration under Section 564(b)(1) of the Act, 21 U.S.C. section 360bbb-3(b)(1), unless the authorization is terminated or revoked.  Performed at [facility], [place] [address], [place], [place] [address]   Urine Culture     Status: Abnormal   Collection Time: 05/20/21 10:17 AM   Specimen: Urine, Clean Catch  Result Value Ref Range Status   Specimen Description URINE, CLEAN CATCH  Final   Special Requests   Final    NONE Performed at [facility], [place] [address], [place], [place] [address]    Culture >=100,000 COLONIES/mL ESCHERICHIA COLI (A)  Final   Report Status 05/22/2021 FINAL  Final   Organism ID, Bacteria ESCHERICHIA COLI (A)  Final      Susceptibility   Escherichia coli - MIC*    AMPICILLIN <=2 SENSITIVE Sensitive     CEFAZOLIN <=4 SENSITIVE Sensitive     CEFEPIME <=0.12 SENSITIVE Sensitive     CEFTRIAXONE <=0.25 SENSITIVE Sensitive     CIPROFLOXACIN <=0.25 SENSITIVE Sensitive     GENTAMICIN <=1 SENSITIVE Sensitive     IMIPENEM <=0.25 SENSITIVE Sensitive     NITROFURANTOIN <=16 SENSITIVE Sensitive     TRIMETH/SULFA <=20 SENSITIVE Sensitive     AMPICILLIN/SULBACTAM <=2 SENSITIVE Sensitive     PIP/TAZO <=4 SENSITIVE Sensitive     * >=100,000 COLONIES/mL ESCHERICHIA COLI  Culture, blood (Routine X 2) w Reflex to ID Panel     Status: None (Preliminary result)   Collection Time: 05/20/21  1:27 PM   Specimen: BLOOD  Result Value Ref Range Status   Specimen Description BLOOD SITE NOT SPECIFIED  Final   Special Requests  Final    BOTTLES DRAWN AEROBIC AND ANAEROBIC Blood Culture results may not be optimal due to an excessive volume of blood received in culture bottles   Culture   Final    NO GROWTH 3 DAYS Performed at [facility], [place] [address], [place], [place] [address]    Report Status PENDING   Incomplete  Culture, blood (Routine X 2) w Reflex to ID Panel     Status: None (Preliminary result)   Collection Time: 05/20/21  1:28 PM   Specimen: BLOOD  Result Value Ref Range Status   Specimen Description BLOOD SITE NOT SPECIFIED  Final   Special Requests   Final    BOTTLES DRAWN AEROBIC ONLY Blood Culture adequate volume   Culture   Final    NO GROWTH 3 DAYS Performed at [facility], [address] [place], [place] [address]    Report Status PENDING  Incomplete    Procedures/Studies: CT HEAD WO CONTRAST  Result Date: 05/19/2021 CLINICAL DATA:  Neurologic deficit, nausea and vomiting EXAM: CT HEAD WITHOUT CONTRAST TECHNIQUE: Contiguous axial images were obtained from the base of the skull through the vertex without intravenous contrast. COMPARISON:  01/31/2021 FINDINGS: Brain: Stable encephalomalacia in the left occipital region compatible with prior cortical infarct. There are stable hypodensities throughout the periventricular white matter compatible with chronic small vessel ischemic changes. No signs of acute infarct or hemorrhage. The lateral ventricles and remaining midline structures are unremarkable. No acute extra-axial fluid collections. No mass effect. Vascular: Stable atherosclerosis.  No hyperdense vessel. Skull: Normal. Negative for fracture or focal lesion. Sinuses/Orbits: No acute finding. Other: None. IMPRESSION: 1. No acute intracranial process. 2. Stable chronic ischemic changes throughout the white matter and left occipital lobe. Electronically Signed   By: Randa Ngo M.D.   On: 05/19/2021 19:05   MR BRAIN WO CONTRAST  Result Date: 05/19/2021 CLINICAL DATA:  Transient ischemic attack (TIA) EXAM: MRI HEAD WITHOUT CONTRAST TECHNIQUE: Multiplanar, multiecho pulse sequences of the brain and surrounding structures were obtained without intravenous contrast. COMPARISON:  Same-day CT head.  MRI June 22 2020. FINDINGS: Brain: Punctate focus of DWI hyperintensity  without definite ADC correlate in the left periventricular parietotemporal region (series 5, image 72). Slight T2 hyperintensity in this region. Remote left temporooccipital infarct with encephalomalacia. Remote small right cerebellar lacunar infarcts. Moderate to advanced patchy and confluent T2 hyperintensity within the white matter, nonspecific but compatible with chronic microvascular ischemic disease. Moderate atrophy with ex vacuo ventricular dilation. No acute hemorrhage, hydrocephalus, mass lesion, midline shift or extra-axial fluid collection. Vascular: Major arterial flow voids are maintained at the skull base. Skull and upper cervical spine: Normal marrow signal. Sinuses/Orbits: Clear sinuses.  No acute orbital findings. Other: No sizable mastoid effusions. IMPRESSION: 1. Possible punctate acute or subacute infarct in the left periventricular parietotemporal region. 2. Remote left temporooccipital and right cerebellar infarcts with moderate to advanced chronic microvascular ischemic disease. Electronically Signed   By: Margaretha Sheffield M.D.   On: 05/19/2021 20:17   DG Chest Port 1 View  Result Date: 05/19/2021 CLINICAL DATA:  Weakness EXAM: PORTABLE CHEST 1 VIEW COMPARISON:  06/01/2007 FINDINGS: The heart size and mediastinal contours are within normal limits when accounting for low lung volumes and technique. No focal pulmonary opacity. No pleural effusion or pneumothorax. No acute osseous abnormality. IMPRESSION: No active disease. Electronically Signed   By: Merilyn Baba M.D.   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 EEG to evaluate for seizure Level of alertness: Awake AEDs during EEG study: VPA Technical aspects: This EEG study was done with scalp electrodes positioned according to the 10-20 International system of electrode placement. Electrical activity was acquired at a sampling rate of '500Hz'$  and reviewed with a high frequency filter of '70Hz'$  and a low frequency filter of '1Hz'$ . EEG data were recorded continuously and digitally stored. Description: The posterior dominant rhythm consists of 7.'5Hz'$  activity of moderate voltage (25-35 uV) seen predominantly in posterior head regions, symmetric and reactive to eye opening and eye closing. EEG showed continuous generalized polymorphic predominantly 5 to 7 Hz theta as well as intermittent 2-'3hz'$  delta slowing. Hyperventilation and photic stimulation were not performed.   ABNORMALITY - Continuous slow, generalized IMPRESSION: This study is suggestive of mild to moderate diffuse encephalopathy, nonspecific etiology. No seizures or epileptiform discharges were seen throughout the recording. Lora Havens   ECHOCARDIOGRAM COMPLETE  Result Date: 05/20/2021    ECHOCARDIOGRAM REPORT   Patient Name:   KIMANH ROSILES Date of Exam: 05/20/2021 Medical Rec #:  [id number]      Height:       62.0 in Accession #:    [id number]     Weight:       160.1 lb Date of Birth:  [birth year]       BSA:          1.739 m Patient Age:    9 years       BP:           136/62 mmHg Patient Gender: F              HR:           101 bpm. Exam Location:  Inpatient Procedure: 2D Echo, Cardiac Doppler and Color Doppler Indications:    CVA  History:        Patient has prior history of Echocardiogram examinations, most                 recent 06/29/2020. TIA, Arrythmias:LBBB; Risk                 Factors:Hypertension and Diabetes.  Sonographer:    [place] Referring Phys: [id number] [place]  1. Systolic anterior motion of  mitral valve with mildly elevated LVOT gradient at rest (2 m/s).  2. Left ventricular ejection fraction, by estimation, is 60 to 65%. The left ventricle has normal function. The left ventricle has no regional wall motion abnormalities. There is mild left ventricular hypertrophy. Left ventricular diastolic parameters are consistent with Grade I diastolic dysfunction (impaired relaxation).  3. Right ventricular systolic function is normal. The right ventricular size is normal. There is moderately elevated pulmonary artery systolic pressure.  4. The mitral valve is normal in structure. Mild mitral valve regurgitation. No evidence of mitral stenosis.  5. The aortic valve is tricuspid. Aortic valve regurgitation is not visualized. Mild aortic valve sclerosis is present, with no evidence of aortic valve stenosis.  6. The inferior vena cava is normal in size with greater than 50% respiratory variability, suggesting  right atrial pressure of 3 mmHg. FINDINGS  Left Ventricle: Left ventricular ejection fraction, by estimation, is 60 to 65%. The left ventricle has normal function. The left ventricle has no regional wall motion abnormalities. The left ventricular internal cavity size was normal in size. There is  mild left ventricular hypertrophy. Abnormal (paradoxical) septal motion, consistent with left bundle branch block. Left ventricular diastolic parameters are consistent with Grade I diastolic dysfunction (impaired relaxation). Right Ventricle: The right ventricular size is normal. Right ventricular systolic function is normal. There is moderately elevated pulmonary artery systolic pressure. The tricuspid regurgitant velocity is 3.28 m/s, and with an assumed right atrial pressure of 3 mmHg, the estimated right ventricular systolic pressure is [id number]. Left Atrium: Left atrial size was normal in size. Right Atrium: Right atrial size was normal in size. Pericardium: There is no evidence of pericardial effusion. Mitral  Valve: The mitral valve is normal in structure. Mild mitral valve regurgitation. No evidence of mitral valve stenosis. Tricuspid Valve: The tricuspid valve is normal in structure. Tricuspid valve regurgitation is mild . No evidence of tricuspid stenosis. Aortic Valve: The aortic valve is tricuspid. Aortic valve regurgitation is not visualized. Mild aortic valve sclerosis is present, with no evidence of aortic valve stenosis. Aortic valve mean gradient measures 9.0 mmHg. Aortic valve peak gradient measures 16.6 mmHg. Aortic valve area, by VTI measures 3.40 cm. Pulmonic Valve: The pulmonic valve was not well visualized. Pulmonic valve regurgitation is not visualized. No evidence of pulmonic stenosis. Aorta: The aortic root is normal in size and structure. Venous: The inferior vena cava is normal in size with greater than 50% respiratory variability, suggesting right atrial pressure of 3 mmHg. IAS/Shunts: No atrial level shunt detected by color flow Doppler. Additional Comments: Systolic anterior motion of mitral valve with mildly elevated LVOT gradient at rest (2 m/s).  LEFT VENTRICLE PLAX 2D LVIDd:         3.40 cm     Diastology LVIDs:         2.50 cm     LV e' medial:    6.09 cm/s LV PW:         1.50 cm     LV E/e' medial:  15.6 LV IVS:        1.30 cm     LV e' lateral:   16.50 cm/s LVOT diam:     2.20 cm     LV E/e' lateral: 5.7 LV SV:         145 LV SV Index:   83 LVOT Area:     3.80 cm  LV Volumes (MOD) LV vol d, MOD A4C: 37.1 ml LV vol s, MOD A4C: 15.8 ml LV SV MOD A4C:     37.1 ml RIGHT VENTRICLE             IVC RV S prime:     15.70 cm/s  IVC diam: 1.40 cm TAPSE (M-mode): 1.8 cm LEFT ATRIUM             Index       RIGHT ATRIUM           Index LA diam:        2.60 cm 1.50 cm/m  RA Area:     12.10 cm LA Vol (A2C):   35.1 ml 20.18 ml/m RA Volume:   25.50 ml  14.66 ml/m LA Vol (A4C):   31.4 ml 18.06 ml/m LA Biplane Vol: 33.4 ml 19.21 ml/m  AORTIC VALVE AV Area (  Vmax):    3.78 cm AV Area (Vmean):   3.64  cm AV Area (VTI):     3.40 cm AV Vmax:           204.00 cm/s AV Vmean:          142.000 cm/s AV VTI:            0.426 m AV Peak Grad:      16.6 mmHg AV Mean Grad:      9.0 mmHg LVOT Vmax:         203.00 cm/s LVOT Vmean:        136.000 cm/s LVOT VTI:          0.381 m LVOT/AV VTI ratio: 0.89  AORTA Ao Root diam: 3.20 cm Ao Asc diam:  3.40 cm MITRAL VALVE                TRICUSPID VALVE MV Area (PHT): 3.55 cm     TR Peak grad:   43.0 mmHg MR Peak grad: 113.6 mmHg    TR Vmax:        328.00 cm/s MR Vmax:      533.00 cm/s MV E velocity: 94.80 cm/s   SHUNTS MV A velocity: 134.00 cm/s  Systemic VTI:  0.38 m MV E/A ratio:  0.71         Systemic Diam: 2.20 cm Kirk Ruths MD Electronically signed by Kirk Ruths MD Signature Date/Time: 05/20/2021/1:34:42 PM    Final    CT HEAD CODE STROKE WO CONTRAST  Result Date: 05/20/2021 CLINICAL DATA:  Code stroke.  Follow-up examination for stroke. EXAM: CT HEAD WITHOUT CONTRAST TECHNIQUE: Contiguous axial images were obtained from the base of the skull through the vertex without intravenous contrast. COMPARISON:  Prior CT and MRI from 05/19/2021. FINDINGS: Brain: Age-related cerebral atrophy with moderate chronic microvascular ischemic disease. Chronic left PCA distribution infarct again noted. Previously identified punctate left-sided acute infarct not visible by CT. No other acute large vessel territory infarct. No acute intracranial hemorrhage. No mass lesion or midline shift. No hydrocephalus or extra-axial fluid collection. Vascular: No hyperdense vessel. Calcified atherosclerosis at the skull base. Skull: Scalp soft tissues and calvarium demonstrate no new finding. Sinuses/Orbits: Globes and orbital soft tissues within normal limits. Paranasal sinuses and mastoid air cells remain clear. Other: None. ASPECTS [facility] Stroke Program Early CT Score) - Ganglionic level infarction (caudate, lentiform nuclei, internal capsule, insula, M1-M3 cortex): 7 - Supraganglionic  infarction (M4-M6 cortex): 3 Total score (0-10 with 10 being normal): 10 IMPRESSION: 1. Stable CT. No new acute intracranial abnormality. Previously identified punctate left-sided infarct not visible by CT. 2. ASPECTS is 10. 3. Atrophy with moderate chronic microvascular ischemic disease and chronic left PCA distribution infarct. These results were communicated to Dr. Rory Percy at 8:29 pm on 05/20/2021 by text page via the [facility] messaging system. Electronically Signed   By: Jeannine Boga M.D.   On: 05/20/2021 20:30    Labs: BNP (last 3 results) No results for input(s): BNP in the last 8760 hours. Basic Metabolic Panel: Recent Labs  Lab 05/19/21 1906 05/19/21 1920 05/20/21 0500 05/21/21 0244 05/22/21 0349 05/23/21 0351  NA 135 138 139 138 138 137  K 4.1 4.2 4.1 3.9 4.1 3.9  CL 99 101 107 104 104 103  CO2 25  --  '23 26 27 22  '$ GLUCOSE 139* 145* 92 113* 133* 111*  BUN '16 17 15 16 23 20  '$ CREATININE 1.11* 1.00 1.07* 1.02* 1.21* 0.74  CALCIUM  9.6  --  8.9 8.8* 8.8* 8.8*  MG  --   --  1.7 1.7 1.7 1.7  PHOS  --   --  3.8 4.2 4.0 3.0   Liver Function Tests: Recent Labs  Lab 05/19/21 1906 05/20/21 0500 05/21/21 0244 05/22/21 0349 05/23/21 0351  AST 40 38 66* 47* 37  ALT '16 15 28 26 21  '$ ALKPHOS 55 44 39 42 38  BILITOT 0.7 0.7 0.4 0.6 0.5  PROT 7.3 6.4* 6.2* 5.8* 6.1*  ALBUMIN 3.5 2.9* 2.8* 2.6* 2.7*   Recent Labs  Lab 05/19/21 1906  LIPASE 29   Recent Labs  Lab 05/19/21 2116  AMMONIA 23   CBC: Recent Labs  Lab 05/19/21 1906 05/19/21 1920 05/20/21 1313 05/21/21 0244 05/22/21 0349 05/23/21 0351  WBC 5.0  --  2.9* 2.1* 2.2* 2.9*  NEUTROABS 4.0  --   --  1.3* 1.1* 1.4*  HGB 11.4* 11.6* 10.3* 9.9* 10.2* 11.2*  HCT 34.7* 34.0* 31.0* 29.9* 31.3* 33.8*  MCV 94.3  --  96.0 95.5 94.8 94.2  PLT 171  --  115* 101* 98* 109*   Cardiac Enzymes: No results for input(s): CKTOTAL, CKMB, CKMBINDEX, TROPONINI in the last 168 hours. BNP: Invalid input(s): POCBNP CBG: Recent  Labs  Lab 05/22/21 1625 05/22/21 2120 05/23/21 0605 05/23/21 0756 05/23/21 1143  GLUCAP 228* 200* 94 89 91   D-Dimer Recent Labs    05/22/21 0349 05/23/21 0351  DDIMER 0.33 0.31   Hgb A1c No results for input(s): HGBA1C in the last 72 hours.  Lipid Profile No results for input(s): CHOL, HDL, LDLCALC, TRIG, CHOLHDL, LDLDIRECT in the last 72 hours. Thyroid function studies No results for input(s): TSH, T4TOTAL, T3FREE, THYROIDAB in the last 72 hours.  Invalid input(s): FREET3 Anemia work up No results for input(s): VITAMINB12, FOLATE, FERRITIN, TIBC, IRON, RETICCTPCT in the last 72 hours.  Urinalysis    Component Value Date/Time   COLORURINE YELLOW 05/20/2021 0748   APPEARANCEUR CLEAR 05/20/2021 0748   LABSPEC 1.025 05/20/2021 0748   PHURINE 6.0 05/20/2021 0748   GLUCOSEU NEGATIVE 05/20/2021 0748   HGBUR TRACE (A) 05/20/2021 0748   BILIRUBINUR NEGATIVE 05/20/2021 0748   KETONESUR NEGATIVE 05/20/2021 0748   PROTEINUR NEGATIVE 05/20/2021 0748   UROBILINOGEN 1.0 06/01/2007 1926   NITRITE POSITIVE (A) 05/20/2021 0748   LEUKOCYTESUR MODERATE (A) 05/20/2021 0748   Sepsis Labs Invalid input(s): PROCALCITONIN,  WBC,  LACTICIDVEN Microbiology Recent Results (from the past 240 hour(s))  Resp Panel by RT-PCR (Flu A&B, Covid) Nasopharyngeal Swab     Status: Abnormal   Collection Time: 05/19/21  8:34 PM   Specimen: Nasopharyngeal Swab; Nasopharyngeal(NP) swabs in vial transport medium  Result Value Ref Range Status   SARS Coronavirus 2 by RT PCR POSITIVE (A) NEGATIVE Final    Comment: RESULT CALLED TO, READ BACK BY AND VERIFIED WITH: RN BRENDA MICHAELSON 05/19/21'@23'$ :20 BY TW (NOTE) SARS-CoV-2 target nucleic acids are DETECTED.  The SARS-CoV-2 RNA is generally detectable in upper respiratory specimens during the acute phase of infection. Positive results are indicative of the presence of the identified virus, but do not rule out bacterial infection or co-infection with  other pathogens not detected by the test. Clinical correlation with patient history and other diagnostic information is necessary to determine patient infection status. The expected result is Negative.  Fact Sheet for Patients: EntrepreneurPulse.com.au  Fact Sheet for Healthcare Providers: IncredibleEmployment.be  This test is not yet approved or cleared by the [place] FDA and  has  been authorized for detection and/or diagnosis of SARS-CoV-2 by FDA under an Emergency Use Authorization (EUA).  This EUA will remain in effect (meaning this test  can be used) for the duration of  the COVID-19 declaration under Section 564(b)(1) of the Act, 21 U.S.C. section 360bbb-3(b)(1), unless the authorization is terminated or revoked sooner.     Influenza A by PCR NEGATIVE NEGATIVE Final   Influenza B by PCR NEGATIVE NEGATIVE Final    Comment: (NOTE) The Xpert Xpress SARS-CoV-2/FLU/RSV plus assay is intended as an aid in the diagnosis of influenza from Nasopharyngeal swab specimens and should not be used as a sole basis for treatment. Nasal washings and aspirates are unacceptable for Xpert Xpress SARS-CoV-2/FLU/RSV testing.  Fact Sheet for Patients: EntrepreneurPulse.com.au  Fact Sheet for Healthcare Providers: IncredibleEmployment.be  This test is not yet approved or cleared by the [place] FDA and has been authorized for detection and/or diagnosis of SARS-CoV-2 by FDA under an Emergency Use Authorization (EUA). This EUA will remain in effect (meaning this test can be used) for the duration of the COVID-19 declaration under Section 564(b)(1) of the Act, 21 U.S.C. section 360bbb-3(b)(1), unless the authorization is terminated or revoked.  Performed at [facility], [place] [address] [place], [place] [address]   Urine Culture     Status: Abnormal   Collection Time: 05/20/21 10:17 AM   Specimen: Urine,  Clean Catch  Result Value Ref Range Status   Specimen Description URINE, CLEAN CATCH  Final   Special Requests   Final    NONE Performed at [facility], [place] [address], [place], [place] [address]    Culture >=100,000 COLONIES/mL ESCHERICHIA COLI (A)  Final   Report Status 05/22/2021 FINAL  Final   Organism ID, Bacteria ESCHERICHIA COLI (A)  Final      Susceptibility   Escherichia coli - MIC*    AMPICILLIN <=2 SENSITIVE Sensitive     CEFAZOLIN <=4 SENSITIVE Sensitive     CEFEPIME <=0.12 SENSITIVE Sensitive     CEFTRIAXONE <=0.25 SENSITIVE Sensitive     CIPROFLOXACIN <=0.25 SENSITIVE Sensitive     GENTAMICIN <=1 SENSITIVE Sensitive     IMIPENEM <=0.25 SENSITIVE Sensitive     NITROFURANTOIN <=16 SENSITIVE Sensitive     TRIMETH/SULFA <=20 SENSITIVE Sensitive     AMPICILLIN/SULBACTAM <=2 SENSITIVE Sensitive     PIP/TAZO <=4 SENSITIVE Sensitive     * >=100,000 COLONIES/mL ESCHERICHIA COLI  Culture, blood (Routine X 2) w Reflex to ID Panel     Status: None (Preliminary result)   Collection Time: 05/20/21  1:27 PM   Specimen: BLOOD  Result Value Ref Range Status   Specimen Description BLOOD SITE NOT SPECIFIED  Final   Special Requests   Final    BOTTLES DRAWN AEROBIC AND ANAEROBIC Blood Culture results may not be optimal due to an excessive volume of blood received in culture bottles   Culture   Final    NO GROWTH 3 DAYS Performed at [facility], [address] [place],  [address]    Report Status PENDING  Incomplete  Culture, blood (Routine X 2) w Reflex to ID Panel     Status: None (Preliminary result)   Collection Time: 05/20/21  1:28 PM   Specimen: BLOOD  Result Value Ref Range Status   Specimen Description BLOOD SITE NOT SPECIFIED  Final   Special Requests   Final    BOTTLES DRAWN AEROBIC ONLY Blood Culture adequate volume   Culture  Final    NO GROWTH 3 DAYS Performed at [facility], [place] [address] [place], [place] [address]    Report Status  PENDING  Incomplete     Time coordinating discharge: 35 minutes  SIGNED: Antonieta Pert, MD  [facility] 05/23/2021, 1:59 PM  If 7PM-7AM, please contact night-coverage [email or web address]

## 2021-05-23 NOTE — Consult Note (Signed)
   [facility] CM Inpatient Consult   05/23/2021  Anna Hunt [birth year] [id number]  [facility] [ACO] Patient: Medicare CMS DCE   Patient screened for hospitalization with noted high risk score for unplanned readmission risk and  to assess for potential [facility] Management service needs for post hospital transition.  6:15 pm Review of patient's medical record reveals patient is to transition back to ALF with Hospice care after review of TOC team and Palliative/Hospice consult.   Plan:  Needs will be met at that level of care. No [facility] Care Management follow up planned at this time.  For questions contact:   Natividad Brood, RN BSN [facility] Liaison  [phone] business mobile phone Toll free office [phone]  Fax number: [phone] [name]

## 2021-05-23 NOTE — TOC Transition Note (Signed)
Transition of Care [facility]) - CM/SW Discharge Note   Patient Details  Name: Anna Hunt MRN: [id number] Date of Birth: Feb 18, 1946  [facility] CM/SW Contact:  Anna Ochs, LCSW Phone Number: 05/23/2021, 2:06 PM   Clinical Narrative:   CSW alerted by MD this morning that patient is cleared for discharge back to ALF with hospice. CSW spoke with [facility] liaison to discuss patient discharging to ALF, and they will contact daughter to establish hospice services. CSW contacted [place], spoke with [place] about patient returning. CSW provided brief update and faxed over medical information for them to review. CSW spoke with patient's daughter, Anna Hunt, to discuss patient's return, and Anna Hunt expressed frustration at not knowing the patient was being discharged and not getting updates on how her mom was doing. CSW answered questions and discussed her frustrations in trying to manage her mom's care, both with the hospital and with [facility]. Anna Hunt asked CSW to contact [place] and speak with the nurse manager, Anna Hunt, to ensure that all the patient's information was received. CSW asked MD to call daughter and discuss discharge instructions, and also reached out to [place] to speak with Anna Hunt. Anna Hunt is off today, Anna Hunt is covering, and Anna Hunt is aware of what's going on with the patient. After MD discussed with daughter, updated medication list and discussed ordering oxygen with CSW. CSW sent updated summary and FL2 to [facility], as well as discussed ordering home oxygen with [facility]. CSW set up PTAR for after 5:30, after oxygen is delivered. CSW updated daughter and answered questions.  Nurse to call report to [phone].    Final next level of care: Home w Hospice Care Barriers to Discharge: Barriers Resolved   Patient Goals and CMS Choice Patient states their goals for this hospitalization and ongoing recovery are:: patient  unable to participate in goal setting, not fully oriented CMS Medicare.gov Compare Post Acute Care list provided to:: Patient Represenative (must comment) Choice offered to / list presented to : Adult Children, [place] / [place]  Discharge Placement              Patient chooses bed at: [facility] Patient to be transferred to facility by: [place] Name of family member notified: Anna Hunt Patient and family notified of of transfer: 05/23/21  Discharge Plan and Services                                     Social Determinants of Health (SDOH) Interventions     Readmission Risk Interventions No flowsheet data found.

## 2021-05-23 NOTE — Progress Notes (Addendum)
Manufacturing engineer [facility]) Hospital Liaison Note:   Referral received for hospice services at home once discharged.  Pt and chart have been reviewed by [facility] MD. Pt has been deemed hospice eligible.  Spoke with pt's daughter by phone to explain services, answer questions and offer support. Per family, plan is to discharge to [place] [facility] today.   Currently, patient has an adjustable bed and requests  O2'@2L'$ . [facility] has ordered the O2 for STAT delivery today.   Per TOC, patient to d/c home via [place].   Please send completed DNR and arrange for any comfort scripts that may be needed for symptom management so there is no lapse in patient comfort prior to hospice services beginning.   Thank you for allowing [place] to participate in this patient's care,   Chrislyn Edison Pace, BSN, RN [place] (in [place]) [phone]

## 2021-05-24 DIAGNOSIS — Z7401 Bed confinement status: Secondary | ICD-10-CM | POA: Diagnosis not present

## 2021-05-24 DIAGNOSIS — M2550 Pain in unspecified joint: Secondary | ICD-10-CM | POA: Diagnosis not present

## 2021-05-24 NOTE — Progress Notes (Signed)
PTAR came to pick up patient to [facility], removed NSL, and tele. All belongings sent with patient.

## 2021-05-25 DIAGNOSIS — I69318 Other symptoms and signs involving cognitive functions following cerebral infarction: Secondary | ICD-10-CM | POA: Diagnosis not present

## 2021-05-25 DIAGNOSIS — I10 Essential (primary) hypertension: Secondary | ICD-10-CM | POA: Diagnosis not present

## 2021-05-25 DIAGNOSIS — D649 Anemia, unspecified: Secondary | ICD-10-CM | POA: Diagnosis not present

## 2021-05-25 DIAGNOSIS — J9601 Acute respiratory failure with hypoxia: Secondary | ICD-10-CM | POA: Diagnosis not present

## 2021-05-25 DIAGNOSIS — E43 Unspecified severe protein-calorie malnutrition: Secondary | ICD-10-CM | POA: Diagnosis not present

## 2021-05-25 DIAGNOSIS — G9340 Encephalopathy, unspecified: Secondary | ICD-10-CM | POA: Diagnosis not present

## 2021-05-25 DIAGNOSIS — R058 Other specified cough: Secondary | ICD-10-CM | POA: Diagnosis not present

## 2021-05-25 DIAGNOSIS — I69398 Other sequelae of cerebral infarction: Secondary | ICD-10-CM | POA: Diagnosis not present

## 2021-05-25 DIAGNOSIS — G928 Other toxic encephalopathy: Secondary | ICD-10-CM | POA: Diagnosis not present

## 2021-05-25 DIAGNOSIS — E039 Hypothyroidism, unspecified: Secondary | ICD-10-CM | POA: Diagnosis not present

## 2021-05-25 DIAGNOSIS — H1132 Conjunctival hemorrhage, left eye: Secondary | ICD-10-CM | POA: Diagnosis not present

## 2021-05-25 DIAGNOSIS — U071 COVID-19: Secondary | ICD-10-CM | POA: Diagnosis not present

## 2021-05-25 DIAGNOSIS — G40909 Epilepsy, unspecified, not intractable, without status epilepticus: Secondary | ICD-10-CM | POA: Diagnosis not present

## 2021-05-25 DIAGNOSIS — N390 Urinary tract infection, site not specified: Secondary | ICD-10-CM | POA: Diagnosis not present

## 2021-05-25 DIAGNOSIS — B9620 Unspecified Escherichia coli [E. coli] as the cause of diseases classified elsewhere: Secondary | ICD-10-CM | POA: Diagnosis not present

## 2021-05-25 DIAGNOSIS — C50912 Malignant neoplasm of unspecified site of left female breast: Secondary | ICD-10-CM | POA: Diagnosis not present

## 2021-05-25 DIAGNOSIS — D709 Neutropenia, unspecified: Secondary | ICD-10-CM | POA: Diagnosis not present

## 2021-05-25 DIAGNOSIS — M5000 Cervical disc disorder with myelopathy, unspecified cervical region: Secondary | ICD-10-CM | POA: Diagnosis not present

## 2021-05-25 DIAGNOSIS — E785 Hyperlipidemia, unspecified: Secondary | ICD-10-CM | POA: Diagnosis not present

## 2021-05-25 DIAGNOSIS — R42 Dizziness and giddiness: Secondary | ICD-10-CM | POA: Diagnosis not present

## 2021-05-25 DIAGNOSIS — E119 Type 2 diabetes mellitus without complications: Secondary | ICD-10-CM | POA: Diagnosis not present

## 2021-05-25 DIAGNOSIS — F32A Depression, unspecified: Secondary | ICD-10-CM | POA: Diagnosis not present

## 2021-05-25 LAB — CULTURE, BLOOD (ROUTINE X 2)
Culture: NO GROWTH
Culture: NO GROWTH
Special Requests: ADEQUATE

## 2021-05-26 DIAGNOSIS — R059 Cough, unspecified: Secondary | ICD-10-CM | POA: Diagnosis not present

## 2021-05-26 DIAGNOSIS — I69318 Other symptoms and signs involving cognitive functions following cerebral infarction: Secondary | ICD-10-CM | POA: Diagnosis not present

## 2021-05-26 DIAGNOSIS — J9601 Acute respiratory failure with hypoxia: Secondary | ICD-10-CM | POA: Diagnosis not present

## 2021-05-26 DIAGNOSIS — G928 Other toxic encephalopathy: Secondary | ICD-10-CM | POA: Diagnosis not present

## 2021-05-26 DIAGNOSIS — U071 COVID-19: Secondary | ICD-10-CM | POA: Diagnosis not present

## 2021-05-26 DIAGNOSIS — N390 Urinary tract infection, site not specified: Secondary | ICD-10-CM | POA: Diagnosis not present

## 2021-05-26 DIAGNOSIS — B9620 Unspecified Escherichia coli [E. coli] as the cause of diseases classified elsewhere: Secondary | ICD-10-CM | POA: Diagnosis not present

## 2021-05-29 DIAGNOSIS — I69318 Other symptoms and signs involving cognitive functions following cerebral infarction: Secondary | ICD-10-CM | POA: Diagnosis not present

## 2021-05-29 DIAGNOSIS — B9620 Unspecified Escherichia coli [E. coli] as the cause of diseases classified elsewhere: Secondary | ICD-10-CM | POA: Diagnosis not present

## 2021-05-29 DIAGNOSIS — J9601 Acute respiratory failure with hypoxia: Secondary | ICD-10-CM | POA: Diagnosis not present

## 2021-05-29 DIAGNOSIS — G928 Other toxic encephalopathy: Secondary | ICD-10-CM | POA: Diagnosis not present

## 2021-05-29 DIAGNOSIS — N390 Urinary tract infection, site not specified: Secondary | ICD-10-CM | POA: Diagnosis not present

## 2021-05-29 DIAGNOSIS — U071 COVID-19: Secondary | ICD-10-CM | POA: Diagnosis not present

## 2021-05-30 DIAGNOSIS — N390 Urinary tract infection, site not specified: Secondary | ICD-10-CM | POA: Diagnosis not present

## 2021-05-30 DIAGNOSIS — J9601 Acute respiratory failure with hypoxia: Secondary | ICD-10-CM | POA: Diagnosis not present

## 2021-05-30 DIAGNOSIS — B9620 Unspecified Escherichia coli [E. coli] as the cause of diseases classified elsewhere: Secondary | ICD-10-CM | POA: Diagnosis not present

## 2021-05-30 DIAGNOSIS — U071 COVID-19: Secondary | ICD-10-CM | POA: Diagnosis not present

## 2021-05-30 DIAGNOSIS — I69318 Other symptoms and signs involving cognitive functions following cerebral infarction: Secondary | ICD-10-CM | POA: Diagnosis not present

## 2021-05-30 DIAGNOSIS — G928 Other toxic encephalopathy: Secondary | ICD-10-CM | POA: Diagnosis not present

## 2021-06-01 DIAGNOSIS — J9601 Acute respiratory failure with hypoxia: Secondary | ICD-10-CM | POA: Diagnosis not present

## 2021-06-01 DIAGNOSIS — I69318 Other symptoms and signs involving cognitive functions following cerebral infarction: Secondary | ICD-10-CM | POA: Diagnosis not present

## 2021-06-01 DIAGNOSIS — B9620 Unspecified Escherichia coli [E. coli] as the cause of diseases classified elsewhere: Secondary | ICD-10-CM | POA: Diagnosis not present

## 2021-06-01 DIAGNOSIS — U071 COVID-19: Secondary | ICD-10-CM | POA: Diagnosis not present

## 2021-06-01 DIAGNOSIS — G928 Other toxic encephalopathy: Secondary | ICD-10-CM | POA: Diagnosis not present

## 2021-06-01 DIAGNOSIS — N390 Urinary tract infection, site not specified: Secondary | ICD-10-CM | POA: Diagnosis not present

## 2021-06-02 DIAGNOSIS — N390 Urinary tract infection, site not specified: Secondary | ICD-10-CM | POA: Diagnosis not present

## 2021-06-02 DIAGNOSIS — I69318 Other symptoms and signs involving cognitive functions following cerebral infarction: Secondary | ICD-10-CM | POA: Diagnosis not present

## 2021-06-02 DIAGNOSIS — B9620 Unspecified Escherichia coli [E. coli] as the cause of diseases classified elsewhere: Secondary | ICD-10-CM | POA: Diagnosis not present

## 2021-06-02 DIAGNOSIS — J9601 Acute respiratory failure with hypoxia: Secondary | ICD-10-CM | POA: Diagnosis not present

## 2021-06-02 DIAGNOSIS — G928 Other toxic encephalopathy: Secondary | ICD-10-CM | POA: Diagnosis not present

## 2021-06-02 DIAGNOSIS — U071 COVID-19: Secondary | ICD-10-CM | POA: Diagnosis not present

## 2021-06-05 DIAGNOSIS — J9601 Acute respiratory failure with hypoxia: Secondary | ICD-10-CM | POA: Diagnosis not present

## 2021-06-05 DIAGNOSIS — N390 Urinary tract infection, site not specified: Secondary | ICD-10-CM | POA: Diagnosis not present

## 2021-06-05 DIAGNOSIS — I69318 Other symptoms and signs involving cognitive functions following cerebral infarction: Secondary | ICD-10-CM | POA: Diagnosis not present

## 2021-06-05 DIAGNOSIS — G928 Other toxic encephalopathy: Secondary | ICD-10-CM | POA: Diagnosis not present

## 2021-06-05 DIAGNOSIS — B9620 Unspecified Escherichia coli [E. coli] as the cause of diseases classified elsewhere: Secondary | ICD-10-CM | POA: Diagnosis not present

## 2021-06-05 DIAGNOSIS — U071 COVID-19: Secondary | ICD-10-CM | POA: Diagnosis not present

## 2021-06-06 DIAGNOSIS — Z79899 Other long term (current) drug therapy: Secondary | ICD-10-CM | POA: Diagnosis not present

## 2021-06-06 DIAGNOSIS — J9601 Acute respiratory failure with hypoxia: Secondary | ICD-10-CM | POA: Diagnosis not present

## 2021-06-06 DIAGNOSIS — I69318 Other symptoms and signs involving cognitive functions following cerebral infarction: Secondary | ICD-10-CM | POA: Diagnosis not present

## 2021-06-06 DIAGNOSIS — N390 Urinary tract infection, site not specified: Secondary | ICD-10-CM | POA: Diagnosis not present

## 2021-06-06 DIAGNOSIS — E038 Other specified hypothyroidism: Secondary | ICD-10-CM | POA: Diagnosis not present

## 2021-06-06 DIAGNOSIS — U071 COVID-19: Secondary | ICD-10-CM | POA: Diagnosis not present

## 2021-06-06 DIAGNOSIS — G928 Other toxic encephalopathy: Secondary | ICD-10-CM | POA: Diagnosis not present

## 2021-06-06 DIAGNOSIS — B9620 Unspecified Escherichia coli [E. coli] as the cause of diseases classified elsewhere: Secondary | ICD-10-CM | POA: Diagnosis not present

## 2021-06-07 DIAGNOSIS — E039 Hypothyroidism, unspecified: Secondary | ICD-10-CM | POA: Diagnosis not present

## 2021-06-07 DIAGNOSIS — D649 Anemia, unspecified: Secondary | ICD-10-CM | POA: Diagnosis not present

## 2021-06-07 DIAGNOSIS — I10 Essential (primary) hypertension: Secondary | ICD-10-CM | POA: Diagnosis not present

## 2021-06-07 DIAGNOSIS — C50919 Malignant neoplasm of unspecified site of unspecified female breast: Secondary | ICD-10-CM | POA: Diagnosis not present

## 2021-06-07 DIAGNOSIS — G459 Transient cerebral ischemic attack, unspecified: Secondary | ICD-10-CM | POA: Diagnosis not present

## 2021-06-07 DIAGNOSIS — E119 Type 2 diabetes mellitus without complications: Secondary | ICD-10-CM | POA: Diagnosis not present

## 2021-06-07 DIAGNOSIS — E038 Other specified hypothyroidism: Secondary | ICD-10-CM | POA: Diagnosis not present

## 2021-06-07 DIAGNOSIS — E559 Vitamin D deficiency, unspecified: Secondary | ICD-10-CM | POA: Diagnosis not present

## 2021-06-08 DIAGNOSIS — J9601 Acute respiratory failure with hypoxia: Secondary | ICD-10-CM | POA: Diagnosis not present

## 2021-06-08 DIAGNOSIS — B9620 Unspecified Escherichia coli [E. coli] as the cause of diseases classified elsewhere: Secondary | ICD-10-CM | POA: Diagnosis not present

## 2021-06-08 DIAGNOSIS — N390 Urinary tract infection, site not specified: Secondary | ICD-10-CM | POA: Diagnosis not present

## 2021-06-08 DIAGNOSIS — G928 Other toxic encephalopathy: Secondary | ICD-10-CM | POA: Diagnosis not present

## 2021-06-08 DIAGNOSIS — U071 COVID-19: Secondary | ICD-10-CM | POA: Diagnosis not present

## 2021-06-08 DIAGNOSIS — I69318 Other symptoms and signs involving cognitive functions following cerebral infarction: Secondary | ICD-10-CM | POA: Diagnosis not present

## 2021-06-09 DIAGNOSIS — G928 Other toxic encephalopathy: Secondary | ICD-10-CM | POA: Diagnosis not present

## 2021-06-09 DIAGNOSIS — J9601 Acute respiratory failure with hypoxia: Secondary | ICD-10-CM | POA: Diagnosis not present

## 2021-06-09 DIAGNOSIS — N390 Urinary tract infection, site not specified: Secondary | ICD-10-CM | POA: Diagnosis not present

## 2021-06-09 DIAGNOSIS — B9620 Unspecified Escherichia coli [E. coli] as the cause of diseases classified elsewhere: Secondary | ICD-10-CM | POA: Diagnosis not present

## 2021-06-09 DIAGNOSIS — U071 COVID-19: Secondary | ICD-10-CM | POA: Diagnosis not present

## 2021-06-09 DIAGNOSIS — I69318 Other symptoms and signs involving cognitive functions following cerebral infarction: Secondary | ICD-10-CM | POA: Diagnosis not present

## 2021-06-10 DIAGNOSIS — G40909 Epilepsy, unspecified, not intractable, without status epilepticus: Secondary | ICD-10-CM | POA: Diagnosis not present

## 2021-06-10 DIAGNOSIS — G928 Other toxic encephalopathy: Secondary | ICD-10-CM | POA: Diagnosis not present

## 2021-06-10 DIAGNOSIS — E119 Type 2 diabetes mellitus without complications: Secondary | ICD-10-CM | POA: Diagnosis not present

## 2021-06-10 DIAGNOSIS — H1132 Conjunctival hemorrhage, left eye: Secondary | ICD-10-CM | POA: Diagnosis not present

## 2021-06-10 DIAGNOSIS — I69318 Other symptoms and signs involving cognitive functions following cerebral infarction: Secondary | ICD-10-CM | POA: Diagnosis not present

## 2021-06-10 DIAGNOSIS — F32A Depression, unspecified: Secondary | ICD-10-CM | POA: Diagnosis not present

## 2021-06-10 DIAGNOSIS — D709 Neutropenia, unspecified: Secondary | ICD-10-CM | POA: Diagnosis not present

## 2021-06-10 DIAGNOSIS — I69398 Other sequelae of cerebral infarction: Secondary | ICD-10-CM | POA: Diagnosis not present

## 2021-06-10 DIAGNOSIS — E43 Unspecified severe protein-calorie malnutrition: Secondary | ICD-10-CM | POA: Diagnosis not present

## 2021-06-10 DIAGNOSIS — J9601 Acute respiratory failure with hypoxia: Secondary | ICD-10-CM | POA: Diagnosis not present

## 2021-06-10 DIAGNOSIS — M5000 Cervical disc disorder with myelopathy, unspecified cervical region: Secondary | ICD-10-CM | POA: Diagnosis not present

## 2021-06-10 DIAGNOSIS — R42 Dizziness and giddiness: Secondary | ICD-10-CM | POA: Diagnosis not present

## 2021-06-10 DIAGNOSIS — B9620 Unspecified Escherichia coli [E. coli] as the cause of diseases classified elsewhere: Secondary | ICD-10-CM | POA: Diagnosis not present

## 2021-06-10 DIAGNOSIS — Z8616 Personal history of COVID-19: Secondary | ICD-10-CM | POA: Diagnosis not present

## 2021-06-10 DIAGNOSIS — C50912 Malignant neoplasm of unspecified site of left female breast: Secondary | ICD-10-CM | POA: Diagnosis not present

## 2021-06-10 DIAGNOSIS — I10 Essential (primary) hypertension: Secondary | ICD-10-CM | POA: Diagnosis not present

## 2021-06-10 DIAGNOSIS — E785 Hyperlipidemia, unspecified: Secondary | ICD-10-CM | POA: Diagnosis not present

## 2021-06-10 DIAGNOSIS — E039 Hypothyroidism, unspecified: Secondary | ICD-10-CM | POA: Diagnosis not present

## 2021-06-10 DIAGNOSIS — N390 Urinary tract infection, site not specified: Secondary | ICD-10-CM | POA: Diagnosis not present

## 2021-06-10 DIAGNOSIS — D649 Anemia, unspecified: Secondary | ICD-10-CM | POA: Diagnosis not present

## 2021-06-12 DIAGNOSIS — N390 Urinary tract infection, site not specified: Secondary | ICD-10-CM | POA: Diagnosis not present

## 2021-06-12 DIAGNOSIS — I69398 Other sequelae of cerebral infarction: Secondary | ICD-10-CM | POA: Diagnosis not present

## 2021-06-12 DIAGNOSIS — B9620 Unspecified Escherichia coli [E. coli] as the cause of diseases classified elsewhere: Secondary | ICD-10-CM | POA: Diagnosis not present

## 2021-06-12 DIAGNOSIS — J9601 Acute respiratory failure with hypoxia: Secondary | ICD-10-CM | POA: Diagnosis not present

## 2021-06-12 DIAGNOSIS — I69318 Other symptoms and signs involving cognitive functions following cerebral infarction: Secondary | ICD-10-CM | POA: Diagnosis not present

## 2021-06-12 DIAGNOSIS — G928 Other toxic encephalopathy: Secondary | ICD-10-CM | POA: Diagnosis not present

## 2021-06-13 DIAGNOSIS — J9601 Acute respiratory failure with hypoxia: Secondary | ICD-10-CM | POA: Diagnosis not present

## 2021-06-13 DIAGNOSIS — I69318 Other symptoms and signs involving cognitive functions following cerebral infarction: Secondary | ICD-10-CM | POA: Diagnosis not present

## 2021-06-13 DIAGNOSIS — G928 Other toxic encephalopathy: Secondary | ICD-10-CM | POA: Diagnosis not present

## 2021-06-13 DIAGNOSIS — I69398 Other sequelae of cerebral infarction: Secondary | ICD-10-CM | POA: Diagnosis not present

## 2021-06-13 DIAGNOSIS — N390 Urinary tract infection, site not specified: Secondary | ICD-10-CM | POA: Diagnosis not present

## 2021-06-13 DIAGNOSIS — B9620 Unspecified Escherichia coli [E. coli] as the cause of diseases classified elsewhere: Secondary | ICD-10-CM | POA: Diagnosis not present

## 2021-06-15 DIAGNOSIS — G928 Other toxic encephalopathy: Secondary | ICD-10-CM | POA: Diagnosis not present

## 2021-06-15 DIAGNOSIS — N390 Urinary tract infection, site not specified: Secondary | ICD-10-CM | POA: Diagnosis not present

## 2021-06-15 DIAGNOSIS — I69398 Other sequelae of cerebral infarction: Secondary | ICD-10-CM | POA: Diagnosis not present

## 2021-06-15 DIAGNOSIS — I69318 Other symptoms and signs involving cognitive functions following cerebral infarction: Secondary | ICD-10-CM | POA: Diagnosis not present

## 2021-06-15 DIAGNOSIS — J9601 Acute respiratory failure with hypoxia: Secondary | ICD-10-CM | POA: Diagnosis not present

## 2021-06-15 DIAGNOSIS — B9620 Unspecified Escherichia coli [E. coli] as the cause of diseases classified elsewhere: Secondary | ICD-10-CM | POA: Diagnosis not present

## 2021-06-16 DIAGNOSIS — G928 Other toxic encephalopathy: Secondary | ICD-10-CM | POA: Diagnosis not present

## 2021-06-16 DIAGNOSIS — J9601 Acute respiratory failure with hypoxia: Secondary | ICD-10-CM | POA: Diagnosis not present

## 2021-06-16 DIAGNOSIS — N390 Urinary tract infection, site not specified: Secondary | ICD-10-CM | POA: Diagnosis not present

## 2021-06-16 DIAGNOSIS — B9620 Unspecified Escherichia coli [E. coli] as the cause of diseases classified elsewhere: Secondary | ICD-10-CM | POA: Diagnosis not present

## 2021-06-16 DIAGNOSIS — I69398 Other sequelae of cerebral infarction: Secondary | ICD-10-CM | POA: Diagnosis not present

## 2021-06-16 DIAGNOSIS — I69318 Other symptoms and signs involving cognitive functions following cerebral infarction: Secondary | ICD-10-CM | POA: Diagnosis not present

## 2021-06-19 DIAGNOSIS — I69318 Other symptoms and signs involving cognitive functions following cerebral infarction: Secondary | ICD-10-CM | POA: Diagnosis not present

## 2021-06-19 DIAGNOSIS — J9601 Acute respiratory failure with hypoxia: Secondary | ICD-10-CM | POA: Diagnosis not present

## 2021-06-19 DIAGNOSIS — B9620 Unspecified Escherichia coli [E. coli] as the cause of diseases classified elsewhere: Secondary | ICD-10-CM | POA: Diagnosis not present

## 2021-06-19 DIAGNOSIS — I69398 Other sequelae of cerebral infarction: Secondary | ICD-10-CM | POA: Diagnosis not present

## 2021-06-19 DIAGNOSIS — G928 Other toxic encephalopathy: Secondary | ICD-10-CM | POA: Diagnosis not present

## 2021-06-19 DIAGNOSIS — N390 Urinary tract infection, site not specified: Secondary | ICD-10-CM | POA: Diagnosis not present

## 2021-06-23 DIAGNOSIS — R627 Adult failure to thrive: Secondary | ICD-10-CM | POA: Diagnosis not present

## 2021-06-23 DIAGNOSIS — I447 Left bundle-branch block, unspecified: Secondary | ICD-10-CM | POA: Diagnosis not present

## 2021-06-23 DIAGNOSIS — E039 Hypothyroidism, unspecified: Secondary | ICD-10-CM | POA: Diagnosis not present

## 2021-06-23 DIAGNOSIS — E559 Vitamin D deficiency, unspecified: Secondary | ICD-10-CM | POA: Diagnosis not present

## 2021-06-23 DIAGNOSIS — E119 Type 2 diabetes mellitus without complications: Secondary | ICD-10-CM | POA: Diagnosis not present

## 2021-06-23 DIAGNOSIS — G459 Transient cerebral ischemic attack, unspecified: Secondary | ICD-10-CM | POA: Diagnosis not present

## 2021-06-23 DIAGNOSIS — I10 Essential (primary) hypertension: Secondary | ICD-10-CM | POA: Diagnosis not present

## 2021-06-23 DIAGNOSIS — E038 Other specified hypothyroidism: Secondary | ICD-10-CM | POA: Diagnosis not present

## 2021-06-23 DIAGNOSIS — D649 Anemia, unspecified: Secondary | ICD-10-CM | POA: Diagnosis not present

## 2021-06-23 DIAGNOSIS — C50919 Malignant neoplasm of unspecified site of unspecified female breast: Secondary | ICD-10-CM | POA: Diagnosis not present

## 2021-06-29 DIAGNOSIS — I69318 Other symptoms and signs involving cognitive functions following cerebral infarction: Secondary | ICD-10-CM | POA: Diagnosis not present

## 2021-06-29 DIAGNOSIS — J9601 Acute respiratory failure with hypoxia: Secondary | ICD-10-CM | POA: Diagnosis not present

## 2021-06-29 DIAGNOSIS — G928 Other toxic encephalopathy: Secondary | ICD-10-CM | POA: Diagnosis not present

## 2021-06-29 DIAGNOSIS — N390 Urinary tract infection, site not specified: Secondary | ICD-10-CM | POA: Diagnosis not present

## 2021-06-29 DIAGNOSIS — B9620 Unspecified Escherichia coli [E. coli] as the cause of diseases classified elsewhere: Secondary | ICD-10-CM | POA: Diagnosis not present

## 2021-06-29 DIAGNOSIS — I69398 Other sequelae of cerebral infarction: Secondary | ICD-10-CM | POA: Diagnosis not present

## 2021-07-03 DIAGNOSIS — B9620 Unspecified Escherichia coli [E. coli] as the cause of diseases classified elsewhere: Secondary | ICD-10-CM | POA: Diagnosis not present

## 2021-07-03 DIAGNOSIS — N390 Urinary tract infection, site not specified: Secondary | ICD-10-CM | POA: Diagnosis not present

## 2021-07-03 DIAGNOSIS — J9601 Acute respiratory failure with hypoxia: Secondary | ICD-10-CM | POA: Diagnosis not present

## 2021-07-03 DIAGNOSIS — G928 Other toxic encephalopathy: Secondary | ICD-10-CM | POA: Diagnosis not present

## 2021-07-03 DIAGNOSIS — I69398 Other sequelae of cerebral infarction: Secondary | ICD-10-CM | POA: Diagnosis not present

## 2021-07-03 DIAGNOSIS — I69318 Other symptoms and signs involving cognitive functions following cerebral infarction: Secondary | ICD-10-CM | POA: Diagnosis not present

## 2021-07-04 DIAGNOSIS — I69398 Other sequelae of cerebral infarction: Secondary | ICD-10-CM | POA: Diagnosis not present

## 2021-07-04 DIAGNOSIS — N390 Urinary tract infection, site not specified: Secondary | ICD-10-CM | POA: Diagnosis not present

## 2021-07-04 DIAGNOSIS — J9601 Acute respiratory failure with hypoxia: Secondary | ICD-10-CM | POA: Diagnosis not present

## 2021-07-04 DIAGNOSIS — B9620 Unspecified Escherichia coli [E. coli] as the cause of diseases classified elsewhere: Secondary | ICD-10-CM | POA: Diagnosis not present

## 2021-07-04 DIAGNOSIS — I69318 Other symptoms and signs involving cognitive functions following cerebral infarction: Secondary | ICD-10-CM | POA: Diagnosis not present

## 2021-07-04 DIAGNOSIS — G928 Other toxic encephalopathy: Secondary | ICD-10-CM | POA: Diagnosis not present

## 2021-07-05 DIAGNOSIS — J9601 Acute respiratory failure with hypoxia: Secondary | ICD-10-CM | POA: Diagnosis not present

## 2021-07-05 DIAGNOSIS — B9620 Unspecified Escherichia coli [E. coli] as the cause of diseases classified elsewhere: Secondary | ICD-10-CM | POA: Diagnosis not present

## 2021-07-05 DIAGNOSIS — G928 Other toxic encephalopathy: Secondary | ICD-10-CM | POA: Diagnosis not present

## 2021-07-05 DIAGNOSIS — N390 Urinary tract infection, site not specified: Secondary | ICD-10-CM | POA: Diagnosis not present

## 2021-07-05 DIAGNOSIS — I69318 Other symptoms and signs involving cognitive functions following cerebral infarction: Secondary | ICD-10-CM | POA: Diagnosis not present

## 2021-07-05 DIAGNOSIS — I69398 Other sequelae of cerebral infarction: Secondary | ICD-10-CM | POA: Diagnosis not present

## 2021-07-06 DIAGNOSIS — B9620 Unspecified Escherichia coli [E. coli] as the cause of diseases classified elsewhere: Secondary | ICD-10-CM | POA: Diagnosis not present

## 2021-07-06 DIAGNOSIS — G928 Other toxic encephalopathy: Secondary | ICD-10-CM | POA: Diagnosis not present

## 2021-07-06 DIAGNOSIS — J9601 Acute respiratory failure with hypoxia: Secondary | ICD-10-CM | POA: Diagnosis not present

## 2021-07-06 DIAGNOSIS — I69318 Other symptoms and signs involving cognitive functions following cerebral infarction: Secondary | ICD-10-CM | POA: Diagnosis not present

## 2021-07-06 DIAGNOSIS — I69398 Other sequelae of cerebral infarction: Secondary | ICD-10-CM | POA: Diagnosis not present

## 2021-07-06 DIAGNOSIS — I10 Essential (primary) hypertension: Secondary | ICD-10-CM | POA: Diagnosis not present

## 2021-07-06 DIAGNOSIS — N390 Urinary tract infection, site not specified: Secondary | ICD-10-CM | POA: Diagnosis not present

## 2021-07-10 DIAGNOSIS — G928 Other toxic encephalopathy: Secondary | ICD-10-CM | POA: Diagnosis not present

## 2021-07-10 DIAGNOSIS — J9601 Acute respiratory failure with hypoxia: Secondary | ICD-10-CM | POA: Diagnosis not present

## 2021-07-10 DIAGNOSIS — I69398 Other sequelae of cerebral infarction: Secondary | ICD-10-CM | POA: Diagnosis not present

## 2021-07-10 DIAGNOSIS — N390 Urinary tract infection, site not specified: Secondary | ICD-10-CM | POA: Diagnosis not present

## 2021-07-10 DIAGNOSIS — I69318 Other symptoms and signs involving cognitive functions following cerebral infarction: Secondary | ICD-10-CM | POA: Diagnosis not present

## 2021-07-10 DIAGNOSIS — B9620 Unspecified Escherichia coli [E. coli] as the cause of diseases classified elsewhere: Secondary | ICD-10-CM | POA: Diagnosis not present

## 2021-07-11 DIAGNOSIS — R42 Dizziness and giddiness: Secondary | ICD-10-CM | POA: Diagnosis not present

## 2021-07-11 DIAGNOSIS — M5000 Cervical disc disorder with myelopathy, unspecified cervical region: Secondary | ICD-10-CM | POA: Diagnosis not present

## 2021-07-11 DIAGNOSIS — D709 Neutropenia, unspecified: Secondary | ICD-10-CM | POA: Diagnosis not present

## 2021-07-11 DIAGNOSIS — I69318 Other symptoms and signs involving cognitive functions following cerebral infarction: Secondary | ICD-10-CM | POA: Diagnosis not present

## 2021-07-11 DIAGNOSIS — F32A Depression, unspecified: Secondary | ICD-10-CM | POA: Diagnosis not present

## 2021-07-11 DIAGNOSIS — G928 Other toxic encephalopathy: Secondary | ICD-10-CM | POA: Diagnosis not present

## 2021-07-11 DIAGNOSIS — G40909 Epilepsy, unspecified, not intractable, without status epilepticus: Secondary | ICD-10-CM | POA: Diagnosis not present

## 2021-07-11 DIAGNOSIS — E785 Hyperlipidemia, unspecified: Secondary | ICD-10-CM | POA: Diagnosis not present

## 2021-07-11 DIAGNOSIS — Z8616 Personal history of COVID-19: Secondary | ICD-10-CM | POA: Diagnosis not present

## 2021-07-11 DIAGNOSIS — N390 Urinary tract infection, site not specified: Secondary | ICD-10-CM | POA: Diagnosis not present

## 2021-07-11 DIAGNOSIS — C50912 Malignant neoplasm of unspecified site of left female breast: Secondary | ICD-10-CM | POA: Diagnosis not present

## 2021-07-11 DIAGNOSIS — E039 Hypothyroidism, unspecified: Secondary | ICD-10-CM | POA: Diagnosis not present

## 2021-07-11 DIAGNOSIS — B9620 Unspecified Escherichia coli [E. coli] as the cause of diseases classified elsewhere: Secondary | ICD-10-CM | POA: Diagnosis not present

## 2021-07-11 DIAGNOSIS — I10 Essential (primary) hypertension: Secondary | ICD-10-CM | POA: Diagnosis not present

## 2021-07-11 DIAGNOSIS — I69398 Other sequelae of cerebral infarction: Secondary | ICD-10-CM | POA: Diagnosis not present

## 2021-07-11 DIAGNOSIS — J9601 Acute respiratory failure with hypoxia: Secondary | ICD-10-CM | POA: Diagnosis not present

## 2021-07-11 DIAGNOSIS — H1132 Conjunctival hemorrhage, left eye: Secondary | ICD-10-CM | POA: Diagnosis not present

## 2021-07-11 DIAGNOSIS — E119 Type 2 diabetes mellitus without complications: Secondary | ICD-10-CM | POA: Diagnosis not present

## 2021-07-11 DIAGNOSIS — E43 Unspecified severe protein-calorie malnutrition: Secondary | ICD-10-CM | POA: Diagnosis not present

## 2021-07-11 DIAGNOSIS — D649 Anemia, unspecified: Secondary | ICD-10-CM | POA: Diagnosis not present

## 2021-07-12 DIAGNOSIS — I69398 Other sequelae of cerebral infarction: Secondary | ICD-10-CM | POA: Diagnosis not present

## 2021-07-12 DIAGNOSIS — B9620 Unspecified Escherichia coli [E. coli] as the cause of diseases classified elsewhere: Secondary | ICD-10-CM | POA: Diagnosis not present

## 2021-07-12 DIAGNOSIS — N390 Urinary tract infection, site not specified: Secondary | ICD-10-CM | POA: Diagnosis not present

## 2021-07-12 DIAGNOSIS — I69318 Other symptoms and signs involving cognitive functions following cerebral infarction: Secondary | ICD-10-CM | POA: Diagnosis not present

## 2021-07-12 DIAGNOSIS — J9601 Acute respiratory failure with hypoxia: Secondary | ICD-10-CM | POA: Diagnosis not present

## 2021-07-12 DIAGNOSIS — G928 Other toxic encephalopathy: Secondary | ICD-10-CM | POA: Diagnosis not present

## 2021-07-13 DIAGNOSIS — B9620 Unspecified Escherichia coli [E. coli] as the cause of diseases classified elsewhere: Secondary | ICD-10-CM | POA: Diagnosis not present

## 2021-07-13 DIAGNOSIS — G928 Other toxic encephalopathy: Secondary | ICD-10-CM | POA: Diagnosis not present

## 2021-07-13 DIAGNOSIS — J9601 Acute respiratory failure with hypoxia: Secondary | ICD-10-CM | POA: Diagnosis not present

## 2021-07-13 DIAGNOSIS — I69398 Other sequelae of cerebral infarction: Secondary | ICD-10-CM | POA: Diagnosis not present

## 2021-07-13 DIAGNOSIS — N390 Urinary tract infection, site not specified: Secondary | ICD-10-CM | POA: Diagnosis not present

## 2021-07-13 DIAGNOSIS — I69318 Other symptoms and signs involving cognitive functions following cerebral infarction: Secondary | ICD-10-CM | POA: Diagnosis not present

## 2021-07-17 DIAGNOSIS — I69318 Other symptoms and signs involving cognitive functions following cerebral infarction: Secondary | ICD-10-CM | POA: Diagnosis not present

## 2021-07-17 DIAGNOSIS — I69398 Other sequelae of cerebral infarction: Secondary | ICD-10-CM | POA: Diagnosis not present

## 2021-07-17 DIAGNOSIS — N390 Urinary tract infection, site not specified: Secondary | ICD-10-CM | POA: Diagnosis not present

## 2021-07-17 DIAGNOSIS — G928 Other toxic encephalopathy: Secondary | ICD-10-CM | POA: Diagnosis not present

## 2021-07-17 DIAGNOSIS — B9620 Unspecified Escherichia coli [E. coli] as the cause of diseases classified elsewhere: Secondary | ICD-10-CM | POA: Diagnosis not present

## 2021-07-17 DIAGNOSIS — J9601 Acute respiratory failure with hypoxia: Secondary | ICD-10-CM | POA: Diagnosis not present

## 2021-07-18 DIAGNOSIS — I69318 Other symptoms and signs involving cognitive functions following cerebral infarction: Secondary | ICD-10-CM | POA: Diagnosis not present

## 2021-07-18 DIAGNOSIS — E039 Hypothyroidism, unspecified: Secondary | ICD-10-CM | POA: Diagnosis not present

## 2021-07-18 DIAGNOSIS — E119 Type 2 diabetes mellitus without complications: Secondary | ICD-10-CM | POA: Diagnosis not present

## 2021-07-18 DIAGNOSIS — D518 Other vitamin B12 deficiency anemias: Secondary | ICD-10-CM | POA: Diagnosis not present

## 2021-07-18 DIAGNOSIS — N390 Urinary tract infection, site not specified: Secondary | ICD-10-CM | POA: Diagnosis not present

## 2021-07-18 DIAGNOSIS — J9601 Acute respiratory failure with hypoxia: Secondary | ICD-10-CM | POA: Diagnosis not present

## 2021-07-18 DIAGNOSIS — I69398 Other sequelae of cerebral infarction: Secondary | ICD-10-CM | POA: Diagnosis not present

## 2021-07-18 DIAGNOSIS — D649 Anemia, unspecified: Secondary | ICD-10-CM | POA: Diagnosis not present

## 2021-07-18 DIAGNOSIS — G459 Transient cerebral ischemic attack, unspecified: Secondary | ICD-10-CM | POA: Diagnosis not present

## 2021-07-18 DIAGNOSIS — I10 Essential (primary) hypertension: Secondary | ICD-10-CM | POA: Diagnosis not present

## 2021-07-18 DIAGNOSIS — C50919 Malignant neoplasm of unspecified site of unspecified female breast: Secondary | ICD-10-CM | POA: Diagnosis not present

## 2021-07-18 DIAGNOSIS — B9620 Unspecified Escherichia coli [E. coli] as the cause of diseases classified elsewhere: Secondary | ICD-10-CM | POA: Diagnosis not present

## 2021-07-18 DIAGNOSIS — G928 Other toxic encephalopathy: Secondary | ICD-10-CM | POA: Diagnosis not present

## 2021-07-18 DIAGNOSIS — E038 Other specified hypothyroidism: Secondary | ICD-10-CM | POA: Diagnosis not present

## 2021-07-18 DIAGNOSIS — E559 Vitamin D deficiency, unspecified: Secondary | ICD-10-CM | POA: Diagnosis not present

## 2021-07-18 DIAGNOSIS — E782 Mixed hyperlipidemia: Secondary | ICD-10-CM | POA: Diagnosis not present

## 2021-07-19 DIAGNOSIS — G928 Other toxic encephalopathy: Secondary | ICD-10-CM | POA: Diagnosis not present

## 2021-07-19 DIAGNOSIS — N390 Urinary tract infection, site not specified: Secondary | ICD-10-CM | POA: Diagnosis not present

## 2021-07-19 DIAGNOSIS — E119 Type 2 diabetes mellitus without complications: Secondary | ICD-10-CM | POA: Diagnosis not present

## 2021-07-19 DIAGNOSIS — H2640 Unspecified secondary cataract: Secondary | ICD-10-CM | POA: Diagnosis not present

## 2021-07-19 DIAGNOSIS — B9620 Unspecified Escherichia coli [E. coli] as the cause of diseases classified elsewhere: Secondary | ICD-10-CM | POA: Diagnosis not present

## 2021-07-19 DIAGNOSIS — J9601 Acute respiratory failure with hypoxia: Secondary | ICD-10-CM | POA: Diagnosis not present

## 2021-07-19 DIAGNOSIS — H04123 Dry eye syndrome of bilateral lacrimal glands: Secondary | ICD-10-CM | POA: Diagnosis not present

## 2021-07-19 DIAGNOSIS — I69398 Other sequelae of cerebral infarction: Secondary | ICD-10-CM | POA: Diagnosis not present

## 2021-07-19 DIAGNOSIS — I69318 Other symptoms and signs involving cognitive functions following cerebral infarction: Secondary | ICD-10-CM | POA: Diagnosis not present

## 2021-07-20 DIAGNOSIS — I69398 Other sequelae of cerebral infarction: Secondary | ICD-10-CM | POA: Diagnosis not present

## 2021-07-20 DIAGNOSIS — N390 Urinary tract infection, site not specified: Secondary | ICD-10-CM | POA: Diagnosis not present

## 2021-07-20 DIAGNOSIS — I69318 Other symptoms and signs involving cognitive functions following cerebral infarction: Secondary | ICD-10-CM | POA: Diagnosis not present

## 2021-07-20 DIAGNOSIS — B9620 Unspecified Escherichia coli [E. coli] as the cause of diseases classified elsewhere: Secondary | ICD-10-CM | POA: Diagnosis not present

## 2021-07-20 DIAGNOSIS — G928 Other toxic encephalopathy: Secondary | ICD-10-CM | POA: Diagnosis not present

## 2021-07-20 DIAGNOSIS — J9601 Acute respiratory failure with hypoxia: Secondary | ICD-10-CM | POA: Diagnosis not present

## 2021-07-24 DIAGNOSIS — E559 Vitamin D deficiency, unspecified: Secondary | ICD-10-CM | POA: Diagnosis not present

## 2021-07-24 DIAGNOSIS — G928 Other toxic encephalopathy: Secondary | ICD-10-CM | POA: Diagnosis not present

## 2021-07-24 DIAGNOSIS — J9601 Acute respiratory failure with hypoxia: Secondary | ICD-10-CM | POA: Diagnosis not present

## 2021-07-24 DIAGNOSIS — D649 Anemia, unspecified: Secondary | ICD-10-CM | POA: Diagnosis not present

## 2021-07-24 DIAGNOSIS — R42 Dizziness and giddiness: Secondary | ICD-10-CM | POA: Diagnosis not present

## 2021-07-24 DIAGNOSIS — I10 Essential (primary) hypertension: Secondary | ICD-10-CM | POA: Diagnosis not present

## 2021-07-24 DIAGNOSIS — I69318 Other symptoms and signs involving cognitive functions following cerebral infarction: Secondary | ICD-10-CM | POA: Diagnosis not present

## 2021-07-24 DIAGNOSIS — B9620 Unspecified Escherichia coli [E. coli] as the cause of diseases classified elsewhere: Secondary | ICD-10-CM | POA: Diagnosis not present

## 2021-07-24 DIAGNOSIS — E039 Hypothyroidism, unspecified: Secondary | ICD-10-CM | POA: Diagnosis not present

## 2021-07-24 DIAGNOSIS — E119 Type 2 diabetes mellitus without complications: Secondary | ICD-10-CM | POA: Diagnosis not present

## 2021-07-24 DIAGNOSIS — I69398 Other sequelae of cerebral infarction: Secondary | ICD-10-CM | POA: Diagnosis not present

## 2021-07-24 DIAGNOSIS — N390 Urinary tract infection, site not specified: Secondary | ICD-10-CM | POA: Diagnosis not present

## 2021-07-25 DIAGNOSIS — I69318 Other symptoms and signs involving cognitive functions following cerebral infarction: Secondary | ICD-10-CM | POA: Diagnosis not present

## 2021-07-25 DIAGNOSIS — J9601 Acute respiratory failure with hypoxia: Secondary | ICD-10-CM | POA: Diagnosis not present

## 2021-07-25 DIAGNOSIS — I69398 Other sequelae of cerebral infarction: Secondary | ICD-10-CM | POA: Diagnosis not present

## 2021-07-25 DIAGNOSIS — N390 Urinary tract infection, site not specified: Secondary | ICD-10-CM | POA: Diagnosis not present

## 2021-07-25 DIAGNOSIS — B9620 Unspecified Escherichia coli [E. coli] as the cause of diseases classified elsewhere: Secondary | ICD-10-CM | POA: Diagnosis not present

## 2021-07-25 DIAGNOSIS — G928 Other toxic encephalopathy: Secondary | ICD-10-CM | POA: Diagnosis not present

## 2021-07-26 DIAGNOSIS — I69398 Other sequelae of cerebral infarction: Secondary | ICD-10-CM | POA: Diagnosis not present

## 2021-07-26 DIAGNOSIS — B9620 Unspecified Escherichia coli [E. coli] as the cause of diseases classified elsewhere: Secondary | ICD-10-CM | POA: Diagnosis not present

## 2021-07-26 DIAGNOSIS — N390 Urinary tract infection, site not specified: Secondary | ICD-10-CM | POA: Diagnosis not present

## 2021-07-26 DIAGNOSIS — I69318 Other symptoms and signs involving cognitive functions following cerebral infarction: Secondary | ICD-10-CM | POA: Diagnosis not present

## 2021-07-26 DIAGNOSIS — G928 Other toxic encephalopathy: Secondary | ICD-10-CM | POA: Diagnosis not present

## 2021-07-26 DIAGNOSIS — J9601 Acute respiratory failure with hypoxia: Secondary | ICD-10-CM | POA: Diagnosis not present

## 2021-07-27 DIAGNOSIS — G928 Other toxic encephalopathy: Secondary | ICD-10-CM | POA: Diagnosis not present

## 2021-07-27 DIAGNOSIS — I69398 Other sequelae of cerebral infarction: Secondary | ICD-10-CM | POA: Diagnosis not present

## 2021-07-27 DIAGNOSIS — B9620 Unspecified Escherichia coli [E. coli] as the cause of diseases classified elsewhere: Secondary | ICD-10-CM | POA: Diagnosis not present

## 2021-07-27 DIAGNOSIS — I69318 Other symptoms and signs involving cognitive functions following cerebral infarction: Secondary | ICD-10-CM | POA: Diagnosis not present

## 2021-07-27 DIAGNOSIS — J9601 Acute respiratory failure with hypoxia: Secondary | ICD-10-CM | POA: Diagnosis not present

## 2021-07-27 DIAGNOSIS — N390 Urinary tract infection, site not specified: Secondary | ICD-10-CM | POA: Diagnosis not present

## 2021-07-31 DIAGNOSIS — G928 Other toxic encephalopathy: Secondary | ICD-10-CM | POA: Diagnosis not present

## 2021-07-31 DIAGNOSIS — N390 Urinary tract infection, site not specified: Secondary | ICD-10-CM | POA: Diagnosis not present

## 2021-07-31 DIAGNOSIS — J9601 Acute respiratory failure with hypoxia: Secondary | ICD-10-CM | POA: Diagnosis not present

## 2021-07-31 DIAGNOSIS — I69398 Other sequelae of cerebral infarction: Secondary | ICD-10-CM | POA: Diagnosis not present

## 2021-07-31 DIAGNOSIS — I69318 Other symptoms and signs involving cognitive functions following cerebral infarction: Secondary | ICD-10-CM | POA: Diagnosis not present

## 2021-07-31 DIAGNOSIS — B9620 Unspecified Escherichia coli [E. coli] as the cause of diseases classified elsewhere: Secondary | ICD-10-CM | POA: Diagnosis not present

## 2021-08-02 DIAGNOSIS — B9620 Unspecified Escherichia coli [E. coli] as the cause of diseases classified elsewhere: Secondary | ICD-10-CM | POA: Diagnosis not present

## 2021-08-02 DIAGNOSIS — G928 Other toxic encephalopathy: Secondary | ICD-10-CM | POA: Diagnosis not present

## 2021-08-02 DIAGNOSIS — I69318 Other symptoms and signs involving cognitive functions following cerebral infarction: Secondary | ICD-10-CM | POA: Diagnosis not present

## 2021-08-02 DIAGNOSIS — N390 Urinary tract infection, site not specified: Secondary | ICD-10-CM | POA: Diagnosis not present

## 2021-08-02 DIAGNOSIS — J9601 Acute respiratory failure with hypoxia: Secondary | ICD-10-CM | POA: Diagnosis not present

## 2021-08-02 DIAGNOSIS — I69398 Other sequelae of cerebral infarction: Secondary | ICD-10-CM | POA: Diagnosis not present

## 2021-08-03 DIAGNOSIS — B9620 Unspecified Escherichia coli [E. coli] as the cause of diseases classified elsewhere: Secondary | ICD-10-CM | POA: Diagnosis not present

## 2021-08-03 DIAGNOSIS — I69318 Other symptoms and signs involving cognitive functions following cerebral infarction: Secondary | ICD-10-CM | POA: Diagnosis not present

## 2021-08-03 DIAGNOSIS — G928 Other toxic encephalopathy: Secondary | ICD-10-CM | POA: Diagnosis not present

## 2021-08-03 DIAGNOSIS — N390 Urinary tract infection, site not specified: Secondary | ICD-10-CM | POA: Diagnosis not present

## 2021-08-03 DIAGNOSIS — J9601 Acute respiratory failure with hypoxia: Secondary | ICD-10-CM | POA: Diagnosis not present

## 2021-08-03 DIAGNOSIS — I69398 Other sequelae of cerebral infarction: Secondary | ICD-10-CM | POA: Diagnosis not present

## 2021-08-07 DIAGNOSIS — I69318 Other symptoms and signs involving cognitive functions following cerebral infarction: Secondary | ICD-10-CM | POA: Diagnosis not present

## 2021-08-07 DIAGNOSIS — G928 Other toxic encephalopathy: Secondary | ICD-10-CM | POA: Diagnosis not present

## 2021-08-07 DIAGNOSIS — N390 Urinary tract infection, site not specified: Secondary | ICD-10-CM | POA: Diagnosis not present

## 2021-08-07 DIAGNOSIS — B9620 Unspecified Escherichia coli [E. coli] as the cause of diseases classified elsewhere: Secondary | ICD-10-CM | POA: Diagnosis not present

## 2021-08-07 DIAGNOSIS — I69398 Other sequelae of cerebral infarction: Secondary | ICD-10-CM | POA: Diagnosis not present

## 2021-08-07 DIAGNOSIS — J9601 Acute respiratory failure with hypoxia: Secondary | ICD-10-CM | POA: Diagnosis not present

## 2021-08-08 DIAGNOSIS — I69318 Other symptoms and signs involving cognitive functions following cerebral infarction: Secondary | ICD-10-CM | POA: Diagnosis not present

## 2021-08-08 DIAGNOSIS — I10 Essential (primary) hypertension: Secondary | ICD-10-CM | POA: Diagnosis not present

## 2021-08-08 DIAGNOSIS — J9601 Acute respiratory failure with hypoxia: Secondary | ICD-10-CM | POA: Diagnosis not present

## 2021-08-08 DIAGNOSIS — I69398 Other sequelae of cerebral infarction: Secondary | ICD-10-CM | POA: Diagnosis not present

## 2021-08-08 DIAGNOSIS — B9620 Unspecified Escherichia coli [E. coli] as the cause of diseases classified elsewhere: Secondary | ICD-10-CM | POA: Diagnosis not present

## 2021-08-08 DIAGNOSIS — G928 Other toxic encephalopathy: Secondary | ICD-10-CM | POA: Diagnosis not present

## 2021-08-08 DIAGNOSIS — N390 Urinary tract infection, site not specified: Secondary | ICD-10-CM | POA: Diagnosis not present

## 2021-08-09 DIAGNOSIS — G928 Other toxic encephalopathy: Secondary | ICD-10-CM | POA: Diagnosis not present

## 2021-08-09 DIAGNOSIS — N390 Urinary tract infection, site not specified: Secondary | ICD-10-CM | POA: Diagnosis not present

## 2021-08-09 DIAGNOSIS — B9620 Unspecified Escherichia coli [E. coli] as the cause of diseases classified elsewhere: Secondary | ICD-10-CM | POA: Diagnosis not present

## 2021-08-09 DIAGNOSIS — I69398 Other sequelae of cerebral infarction: Secondary | ICD-10-CM | POA: Diagnosis not present

## 2021-08-09 DIAGNOSIS — J9601 Acute respiratory failure with hypoxia: Secondary | ICD-10-CM | POA: Diagnosis not present

## 2021-08-09 DIAGNOSIS — I69318 Other symptoms and signs involving cognitive functions following cerebral infarction: Secondary | ICD-10-CM | POA: Diagnosis not present

## 2021-08-10 DIAGNOSIS — N390 Urinary tract infection, site not specified: Secondary | ICD-10-CM | POA: Diagnosis not present

## 2021-08-10 DIAGNOSIS — J9601 Acute respiratory failure with hypoxia: Secondary | ICD-10-CM | POA: Diagnosis not present

## 2021-08-10 DIAGNOSIS — E039 Hypothyroidism, unspecified: Secondary | ICD-10-CM | POA: Diagnosis not present

## 2021-08-10 DIAGNOSIS — G928 Other toxic encephalopathy: Secondary | ICD-10-CM | POA: Diagnosis not present

## 2021-08-10 DIAGNOSIS — I69398 Other sequelae of cerebral infarction: Secondary | ICD-10-CM | POA: Diagnosis not present

## 2021-08-10 DIAGNOSIS — E119 Type 2 diabetes mellitus without complications: Secondary | ICD-10-CM | POA: Diagnosis not present

## 2021-08-10 DIAGNOSIS — Z8616 Personal history of COVID-19: Secondary | ICD-10-CM | POA: Diagnosis not present

## 2021-08-10 DIAGNOSIS — E785 Hyperlipidemia, unspecified: Secondary | ICD-10-CM | POA: Diagnosis not present

## 2021-08-10 DIAGNOSIS — F32A Depression, unspecified: Secondary | ICD-10-CM | POA: Diagnosis not present

## 2021-08-10 DIAGNOSIS — H1132 Conjunctival hemorrhage, left eye: Secondary | ICD-10-CM | POA: Diagnosis not present

## 2021-08-10 DIAGNOSIS — R42 Dizziness and giddiness: Secondary | ICD-10-CM | POA: Diagnosis not present

## 2021-08-10 DIAGNOSIS — I10 Essential (primary) hypertension: Secondary | ICD-10-CM | POA: Diagnosis not present

## 2021-08-10 DIAGNOSIS — C50912 Malignant neoplasm of unspecified site of left female breast: Secondary | ICD-10-CM | POA: Diagnosis not present

## 2021-08-10 DIAGNOSIS — B9620 Unspecified Escherichia coli [E. coli] as the cause of diseases classified elsewhere: Secondary | ICD-10-CM | POA: Diagnosis not present

## 2021-08-10 DIAGNOSIS — D649 Anemia, unspecified: Secondary | ICD-10-CM | POA: Diagnosis not present

## 2021-08-10 DIAGNOSIS — M5000 Cervical disc disorder with myelopathy, unspecified cervical region: Secondary | ICD-10-CM | POA: Diagnosis not present

## 2021-08-10 DIAGNOSIS — I69318 Other symptoms and signs involving cognitive functions following cerebral infarction: Secondary | ICD-10-CM | POA: Diagnosis not present

## 2021-08-10 DIAGNOSIS — D709 Neutropenia, unspecified: Secondary | ICD-10-CM | POA: Diagnosis not present

## 2021-08-10 DIAGNOSIS — E43 Unspecified severe protein-calorie malnutrition: Secondary | ICD-10-CM | POA: Diagnosis not present

## 2021-08-10 DIAGNOSIS — G40909 Epilepsy, unspecified, not intractable, without status epilepticus: Secondary | ICD-10-CM | POA: Diagnosis not present

## 2021-08-14 DIAGNOSIS — I69398 Other sequelae of cerebral infarction: Secondary | ICD-10-CM | POA: Diagnosis not present

## 2021-08-14 DIAGNOSIS — G928 Other toxic encephalopathy: Secondary | ICD-10-CM | POA: Diagnosis not present

## 2021-08-14 DIAGNOSIS — N390 Urinary tract infection, site not specified: Secondary | ICD-10-CM | POA: Diagnosis not present

## 2021-08-14 DIAGNOSIS — B9620 Unspecified Escherichia coli [E. coli] as the cause of diseases classified elsewhere: Secondary | ICD-10-CM | POA: Diagnosis not present

## 2021-08-14 DIAGNOSIS — J9601 Acute respiratory failure with hypoxia: Secondary | ICD-10-CM | POA: Diagnosis not present

## 2021-08-14 DIAGNOSIS — I69318 Other symptoms and signs involving cognitive functions following cerebral infarction: Secondary | ICD-10-CM | POA: Diagnosis not present

## 2021-08-15 ENCOUNTER — Emergency Department (HOSPITAL_COMMUNITY)

## 2021-08-15 ENCOUNTER — Other Ambulatory Visit: Payer: Self-pay

## 2021-08-15 ENCOUNTER — Inpatient Hospital Stay (HOSPITAL_COMMUNITY)
Admission: EM | Admit: 2021-08-15 | Discharge: 2021-08-17 | DRG: 391 | Disposition: A | Discharge status: Home / Self Care | Source: Skilled Nursing Facility | Attending: Internal Medicine | Admitting: Internal Medicine

## 2021-08-15 DIAGNOSIS — R112 Nausea with vomiting, unspecified: Secondary | ICD-10-CM | POA: Diagnosis not present

## 2021-08-15 DIAGNOSIS — E119 Type 2 diabetes mellitus without complications: Secondary | ICD-10-CM | POA: Diagnosis not present

## 2021-08-15 DIAGNOSIS — Z853 Personal history of malignant neoplasm of breast: Secondary | ICD-10-CM

## 2021-08-15 DIAGNOSIS — R4182 Altered mental status, unspecified: Secondary | ICD-10-CM | POA: Diagnosis not present

## 2021-08-15 DIAGNOSIS — I6529 Occlusion and stenosis of unspecified carotid artery: Secondary | ICD-10-CM | POA: Diagnosis not present

## 2021-08-15 DIAGNOSIS — A084 Viral intestinal infection, unspecified: Principal | ICD-10-CM | POA: Diagnosis present

## 2021-08-15 DIAGNOSIS — Z20822 Contact with and (suspected) exposure to covid-19: Secondary | ICD-10-CM | POA: Diagnosis present

## 2021-08-15 DIAGNOSIS — Z79890 Hormone replacement therapy (postmenopausal): Secondary | ICD-10-CM

## 2021-08-15 DIAGNOSIS — R000 Tachycardia, unspecified: Secondary | ICD-10-CM | POA: Diagnosis not present

## 2021-08-15 DIAGNOSIS — N390 Urinary tract infection, site not specified: Secondary | ICD-10-CM | POA: Diagnosis present

## 2021-08-15 DIAGNOSIS — R0600 Dyspnea, unspecified: Secondary | ICD-10-CM | POA: Diagnosis not present

## 2021-08-15 DIAGNOSIS — B9620 Unspecified Escherichia coli [E. coli] as the cause of diseases classified elsewhere: Secondary | ICD-10-CM | POA: Diagnosis not present

## 2021-08-15 DIAGNOSIS — Z7902 Long term (current) use of antithrombotics/antiplatelets: Secondary | ICD-10-CM

## 2021-08-15 DIAGNOSIS — G9341 Metabolic encephalopathy: Secondary | ICD-10-CM | POA: Diagnosis present

## 2021-08-15 DIAGNOSIS — Z794 Long term (current) use of insulin: Secondary | ICD-10-CM

## 2021-08-15 DIAGNOSIS — I6782 Cerebral ischemia: Secondary | ICD-10-CM | POA: Diagnosis not present

## 2021-08-15 DIAGNOSIS — G9389 Other specified disorders of brain: Secondary | ICD-10-CM | POA: Diagnosis not present

## 2021-08-15 DIAGNOSIS — R110 Nausea: Secondary | ICD-10-CM | POA: Diagnosis not present

## 2021-08-15 DIAGNOSIS — I10 Essential (primary) hypertension: Secondary | ICD-10-CM | POA: Diagnosis not present

## 2021-08-15 DIAGNOSIS — E785 Hyperlipidemia, unspecified: Secondary | ICD-10-CM | POA: Diagnosis present

## 2021-08-15 DIAGNOSIS — Z803 Family history of malignant neoplasm of breast: Secondary | ICD-10-CM

## 2021-08-15 DIAGNOSIS — Z8659 Personal history of other mental and behavioral disorders: Secondary | ICD-10-CM | POA: Diagnosis not present

## 2021-08-15 DIAGNOSIS — R569 Unspecified convulsions: Secondary | ICD-10-CM | POA: Diagnosis present

## 2021-08-15 DIAGNOSIS — Z66 Do not resuscitate: Secondary | ICD-10-CM | POA: Diagnosis present

## 2021-08-15 DIAGNOSIS — J9601 Acute respiratory failure with hypoxia: Secondary | ICD-10-CM | POA: Diagnosis not present

## 2021-08-15 DIAGNOSIS — R1111 Vomiting without nausea: Secondary | ICD-10-CM | POA: Diagnosis not present

## 2021-08-15 DIAGNOSIS — G928 Other toxic encephalopathy: Secondary | ICD-10-CM | POA: Diagnosis not present

## 2021-08-15 DIAGNOSIS — R509 Fever, unspecified: Secondary | ICD-10-CM | POA: Diagnosis not present

## 2021-08-15 DIAGNOSIS — Z79899 Other long term (current) drug therapy: Secondary | ICD-10-CM

## 2021-08-15 DIAGNOSIS — Z8673 Personal history of transient ischemic attack (TIA), and cerebral infarction without residual deficits: Secondary | ICD-10-CM

## 2021-08-15 DIAGNOSIS — A059 Bacterial foodborne intoxication, unspecified: Secondary | ICD-10-CM | POA: Diagnosis present

## 2021-08-15 DIAGNOSIS — Z8249 Family history of ischemic heart disease and other diseases of the circulatory system: Secondary | ICD-10-CM

## 2021-08-15 DIAGNOSIS — R404 Transient alteration of awareness: Secondary | ICD-10-CM | POA: Diagnosis not present

## 2021-08-15 DIAGNOSIS — I69318 Other symptoms and signs involving cognitive functions following cerebral infarction: Secondary | ICD-10-CM | POA: Diagnosis not present

## 2021-08-15 DIAGNOSIS — Z87891 Personal history of nicotine dependence: Secondary | ICD-10-CM

## 2021-08-15 DIAGNOSIS — F0393 Unspecified dementia, unspecified severity, with mood disturbance: Secondary | ICD-10-CM | POA: Diagnosis present

## 2021-08-15 DIAGNOSIS — I69398 Other sequelae of cerebral infarction: Secondary | ICD-10-CM | POA: Diagnosis not present

## 2021-08-15 DIAGNOSIS — I639 Cerebral infarction, unspecified: Secondary | ICD-10-CM | POA: Diagnosis present

## 2021-08-15 DIAGNOSIS — R0689 Other abnormalities of breathing: Secondary | ICD-10-CM | POA: Diagnosis not present

## 2021-08-15 DIAGNOSIS — Z79811 Long term (current) use of aromatase inhibitors: Secondary | ICD-10-CM

## 2021-08-15 DIAGNOSIS — E039 Hypothyroidism, unspecified: Secondary | ICD-10-CM | POA: Diagnosis present

## 2021-08-15 LAB — CBC WITH DIFFERENTIAL/PLATELET
Abs Immature Granulocytes: 0.03 10*3/uL (ref 0.00–0.07)
Basophils Absolute: 0 10*3/uL (ref 0.0–0.1)
Basophils Relative: 0 %
Eosinophils Absolute: 0 10*3/uL (ref 0.0–0.5)
Eosinophils Relative: 0 %
HCT: 35 % — ABNORMAL LOW (ref 36.0–46.0)
Hemoglobin: 11.7 g/dL — ABNORMAL LOW (ref 12.0–15.0)
Immature Granulocytes: 1 %
Lymphocytes Relative: 11 %
Lymphs Abs: 0.6 10*3/uL — ABNORMAL LOW (ref 0.7–4.0)
MCH: 31.5 pg (ref 26.0–34.0)
MCHC: 33.4 g/dL (ref 30.0–36.0)
MCV: 94.1 fL (ref 80.0–100.0)
Monocytes Absolute: 0.5 10*3/uL (ref 0.1–1.0)
Monocytes Relative: 9 %
Neutro Abs: 4.9 10*3/uL (ref 1.7–7.7)
Neutrophils Relative %: 79 %
Platelets: 246 10*3/uL (ref 150–400)
RBC: 3.72 MIL/uL — ABNORMAL LOW (ref 3.87–5.11)
RDW: 12.5 % (ref 11.5–15.5)
WBC: 6.1 10*3/uL (ref 4.0–10.5)
nRBC: 0 % (ref 0.0–0.2)

## 2021-08-15 LAB — COMPREHENSIVE METABOLIC PANEL
ALT: 17 U/L (ref 0–44)
AST: 38 U/L (ref 15–41)
Albumin: 3.3 g/dL — ABNORMAL LOW (ref 3.5–5.0)
Alkaline Phosphatase: 49 U/L (ref 38–126)
Anion gap: 12 (ref 5–15)
BUN: 21 mg/dL (ref 8–23)
CO2: 25 mmol/L (ref 22–32)
Calcium: 9.3 mg/dL (ref 8.9–10.3)
Chloride: 98 mmol/L (ref 98–111)
Creatinine, Ser: 1.07 mg/dL — ABNORMAL HIGH (ref 0.44–1.00)
GFR, Estimated: 54 mL/min — ABNORMAL LOW (ref >60)
Glucose, Bld: 173 mg/dL — ABNORMAL HIGH (ref 70–99)
Potassium: 4.6 mmol/L (ref 3.5–5.1)
Sodium: 135 mmol/L (ref 135–145)
Total Bilirubin: 0.7 mg/dL (ref 0.3–1.2)
Total Protein: 6.8 g/dL (ref 6.5–8.1)

## 2021-08-15 LAB — RESP PANEL BY RT-PCR (FLU A&B, COVID) ARPGX2
Influenza A by PCR: NEGATIVE
Influenza B by PCR: NEGATIVE
SARS Coronavirus 2 by RT PCR: NEGATIVE

## 2021-08-15 LAB — PROTIME-INR
INR: 1.1 (ref 0.8–1.2)
Prothrombin Time: 14.3 seconds (ref 11.4–15.2)

## 2021-08-15 LAB — TROPONIN I (HIGH SENSITIVITY): Troponin I (High Sensitivity): 6 ng/L (ref <18)

## 2021-08-15 LAB — LIPASE, BLOOD: Lipase: 31 U/L (ref 11–51)

## 2021-08-15 LAB — VALPROIC ACID LEVEL: Valproic Acid Lvl: 37 ug/mL — ABNORMAL LOW (ref 50.0–100.0)

## 2021-08-15 LAB — LACTIC ACID, PLASMA: Lactic Acid, Venous: 3 mmol/L (ref 0.5–1.9)

## 2021-08-15 MED ORDER — SODIUM CHLORIDE 0.9% FLUSH
3.0000 mL | Freq: Two times a day (BID) | INTRAVENOUS | Status: DC
  Administered 2021-08-16 – 2021-08-17 (×2): 3 mL via INTRAVENOUS

## 2021-08-15 MED ORDER — SODIUM CHLORIDE 0.9 % IV SOLN: INTRAVENOUS | Status: AC

## 2021-08-15 MED ORDER — CLOPIDOGREL BISULFATE 75 MG PO TABS
75.0000 mg | ORAL_TABLET | Freq: Every day | ORAL | Status: DC
  Administered 2021-08-16 – 2021-08-17 (×2): 75 mg via ORAL
  Filled 2021-08-15 (×2): qty 1

## 2021-08-15 MED ORDER — SODIUM CHLORIDE 0.9 % IV BOLUS
500.0000 mL | Freq: Once | INTRAVENOUS | Status: AC
  Administered 2021-08-16: 500 mL via INTRAVENOUS

## 2021-08-15 MED ORDER — ACETAMINOPHEN 650 MG RE SUPP: 650.0000 mg | Freq: Four times a day (QID) | RECTAL | Status: DC | PRN

## 2021-08-15 MED ORDER — ACETAMINOPHEN 325 MG PO TABS: 650.0000 mg | ORAL_TABLET | Freq: Four times a day (QID) | ORAL | Status: DC | PRN

## 2021-08-15 MED ORDER — SIMVASTATIN 20 MG PO TABS
40.0000 mg | ORAL_TABLET | Freq: Every day | ORAL | Status: DC
  Administered 2021-08-16: 40 mg via ORAL
  Filled 2021-08-15: qty 2

## 2021-08-15 MED ORDER — LEVOTHYROXINE SODIUM 25 MCG PO TABS
25.0000 ug | ORAL_TABLET | Freq: Every morning | ORAL | Status: DC
  Administered 2021-08-16 – 2021-08-17 (×2): 25 ug via ORAL
  Filled 2021-08-15 (×2): qty 1

## 2021-08-15 MED ORDER — INSULIN ASPART 100 UNIT/ML IJ SOLN
0.0000 [IU] | Freq: Three times a day (TID) | INTRAMUSCULAR | Status: DC
Start: 2021-08-16 — End: 2021-08-17

## 2021-08-15 MED ORDER — ACETAMINOPHEN 325 MG PO TABS
650.0000 mg | ORAL_TABLET | Freq: Once | ORAL | Status: AC
  Administered 2021-08-15: 650 mg via ORAL
  Filled 2021-08-15: qty 2

## 2021-08-15 MED ORDER — ONDANSETRON HCL 4 MG/2ML IJ SOLN
4.0000 mg | Freq: Once | INTRAMUSCULAR | Status: AC
  Administered 2021-08-15: 4 mg via INTRAVENOUS
  Filled 2021-08-15: qty 2

## 2021-08-15 MED ORDER — DIVALPROEX SODIUM ER 500 MG PO TB24
500.0000 mg | ORAL_TABLET | Freq: Every day | ORAL | Status: DC
  Administered 2021-08-16: 500 mg via ORAL
  Filled 2021-08-15 (×3): qty 1

## 2021-08-15 MED ORDER — ENOXAPARIN SODIUM 40 MG/0.4ML IJ SOSY
40.0000 mg | PREFILLED_SYRINGE | INTRAMUSCULAR | Status: DC
  Administered 2021-08-16 – 2021-08-17 (×2): 40 mg via SUBCUTANEOUS
  Filled 2021-08-15 (×2): qty 0.4

## 2021-08-15 MED ORDER — PAROXETINE HCL 20 MG PO TABS
20.0000 mg | ORAL_TABLET | Freq: Every day | ORAL | Status: DC
  Administered 2021-08-16: 20 mg via ORAL
  Filled 2021-08-15 (×3): qty 1

## 2021-08-15 MED ORDER — SODIUM CHLORIDE 0.9 % IV BOLUS
500.0000 mL | Freq: Once | INTRAVENOUS | Status: AC
  Administered 2021-08-15: 500 mL via INTRAVENOUS

## 2021-08-15 NOTE — ED Notes (Addendum)
Patient transported to MRI 

## 2021-08-15 NOTE — ED Provider Notes (Signed)
[facility] EMERGENCY DEPARTMENT Provider Note   CSN: [id number] Arrival date & time: 08/15/21  1748     History Chief Complaint  Patient presents with   Emesis   Weakness    Anna Hunt is a 75 y.o. female.  75 year old female presents from her facility for evaluation.  Patient with episode of emesis x5 that occurred earlier today.  This appears to have started around lunch.  Patient was with family member at [employer].  At the time of her emesis the patient was significantly weaker than her baseline.  She was unable to ambulate.  This has improved in the interim.  Patient reports that her nausea is much better now.  Patient with history of dementia and CVA.  Patient apparently was somewhat altered at the time of the emesis.  Patient is now returned to baseline.  Family at bedside reports that she is interacting appropriately.  They deny any change in her speech.  Patient with similar presentation with her last stroke.  Family is concerned that symptoms today could be a sign of a recurrent stroke.  The history is provided by the patient, medical records and the EMS personnel.  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    Unspecified cerebral artery occlusion with cerebral infarction    Vertigo     Patient Active Problem List   Diagnosis Date Noted   COVID-19 virus infection 05/20/2021   Acute  CVA (cerebrovascular accident) ([place]) 05/19/2021   Decreased GFR 05/19/2021   History of depression    Hyperlipidemia    Malnutrition of moderate degree 11/05/2020   Adult failure to thrive 11/02/2020   Type 2 diabetes mellitus with hyperglycemia ([place]) 11/02/2020   Normocytic anemia 11/02/2020   Acute UTI (urinary tract infection) 11/02/2020   Mild protein-calorie malnutrition ([place]) 11/02/2020   Failure to thrive in adult 11/02/2020   Urinary incontinence 10/11/2020   Cervical myelopathy ([place]) 06/24/2020   Genetic testing 06/23/2020   Cerebrovascular accident (CVA) ([place]) 06/23/2020   Gait abnormality 06/16/2020   Malignant neoplasm of upper-inner quadrant of left breast in female, 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   TIA (transient ischemic attack) 11/16/2018   HTN (hypertension), benign 11/16/2018   LBBB (left bundle branch block)    Diabetes mellitus without complication ([place])    Stroke ([place]) 01/22/2013   Displacement of cervical intervertebral disc without myelopathy     Past Surgical History:  Procedure Laterality Date   BACK SURGERY     BREAST LUMPECTOMY WITH RADIOACTIVE SEED LOCALIZATION Left 09/15/2020   Procedure: LEFT BREAST LUMPECTOMY WITH RADIOACTIVE SEED LOCALIZATION;  Surgeon: Rolm Bookbinder, MD;  Location: [facility];  Service: General;  Laterality: Left;   CATARACT EXTRACTION W/ INTRAOCULAR LENS  IMPLANT, BILATERAL     TUBAL LIGATION       OB History   No obstetric history on file.  Family History  Problem Relation Age of Onset   Heart attack Sister 73   Breast cancer Sister 93   Breast cancer Sister 45    Social History   Tobacco Use   Smoking status: Former   Smokeless tobacco: Never   Tobacco comments:    12/29/2021per patient quit about 20 years ago  Vaping Use   Vaping Use: Never used  Substance Use Topics   Alcohol use: No   Drug use: No    Home Medications Prior to Admission medications   Medication Sig Start Date End Date  Taking? Authorizing Provider  acetaminophen (TYLENOL) 500 MG tablet Take 1,000 mg by mouth every 6 (six) hours as needed for mild pain.    [provider]  anastrozole (ARIMIDEX) 1 MG tablet Take 1 tablet (1 mg total) by mouth daily. 08/24/20   Gardenia Phlegm, NP  BD PEN NEEDLE NANO U/F 32G X 4 MM MISC  12/26/12   [provider]  cholecalciferol (VITAMIN D3) 25 MCG (1000 UNIT) tablet Take 1,000 Units by mouth every morning.    [provider]  clopidogrel (PLAVIX) 75 MG tablet Take 1 tablet (75 mg total) by mouth daily. 11/17/18   Thurnell Lose, MD  divalproex (DEPAKOTE ER) 500 MG 24 hr tablet Take 1 tablet (500 mg total) by mouth daily. 05/24/21   Antonieta Pert, MD  divalproex (DEPAKOTE ER) 500 MG 24 hr tablet Take 1 tablet (500 mg total) by mouth at bedtime. 05/23/21   Antonieta Pert, MD  furosemide (LASIX) 20 MG tablet Take 20 mg by mouth every morning.    [provider]  insulin aspart (NOVOLOG FLEXPEN) 100 UNIT/ML FlexPen Inject 0-15 Units into the skin See admin instructions. Inject 0-15 units subcutaneously three times daily before meals and at bedtime per sliding scale: CBG 70-100 0 units, 151-250 2 units, 151 2-- 3 units, 201-250 5 units, 251-300 8 units, 301-350 11 units, 351-400 15 units, >400 call MD    [provider]  levothyroxine (SYNTHROID) 25 MCG tablet Take 25 mcg by mouth every morning.    [provider]  metFORMIN (GLUCOPHAGE-XR) 500 MG 24 hr tablet Take 500 mg by mouth at bedtime. 01/05/13   [provider]  Multiple Vitamin (MULTIVITAMIN WITH MINERALS) TABS tablet Take 1 tablet by mouth every morning.    [provider]  PARoxetine (PAXIL) 20 MG tablet Take 20 mg by mouth at bedtime.  01/05/13   [provider]  simvastatin (ZOCOR) 40 MG tablet Take 40 mg by mouth at bedtime.  01/05/13   [provider]  telmisartan (MICARDIS) 20 MG tablet Take 20 mg by mouth every morning.    [provider]  traMADol (ULTRAM) 50 MG tablet Take 1 tablet (50 mg total) by mouth every 6 (six) hours as needed. Patient taking differently: Take 50 mg by mouth every 8 (eight) hours as needed (pain). 12/29/20   Drenda Freeze, MD    Allergies    Niaspan [niacin er]  Review of Systems   Review of Systems  Gastrointestinal:  Positive for vomiting.  Neurological:  Positive for weakness.  All other systems reviewed and are negative.  Physical Exam Updated Vital Signs BP 133/71   Pulse 100   Temp 98.8 F (37.1 C)   Resp (!) 22   SpO2 94%   Physical Exam Vitals and nursing note reviewed.  Constitutional:      General: She is not in acute distress.  Appearance: Normal appearance. She is well-developed.  HENT:     Head: Normocephalic and atraumatic.  Eyes:     Conjunctiva/sclera: Conjunctivae normal.     Pupils: Pupils are equal, round, and reactive to light.  Cardiovascular:     Rate and Rhythm: Normal rate and regular rhythm.     Heart sounds: Normal heart sounds.  Pulmonary:     Effort: Pulmonary effort is normal. No respiratory distress.     Breath sounds: Normal breath sounds.  Abdominal:     General: There is no distension.     Palpations: Abdomen is soft.     Tenderness: There is no abdominal tenderness.  Musculoskeletal:        General: No deformity. Normal range of motion.     Cervical back: Normal range of motion and neck supple.  Skin:    General: Skin is warm and dry.  Neurological:     General: No focal deficit present.     Mental Status: She is alert. Mental status is at baseline.     Comments: Oriented to person.  Pleasantly confused otherwise.  Normal speech  No facial droop  VAN negative  No focal deficit.    ED Results / Procedures / Treatments   Labs (all labs ordered are listed, but only abnormal results are displayed) Labs Reviewed  COMPREHENSIVE METABOLIC PANEL - Abnormal; Notable for the following components:      Result Value    Glucose, Bld 173 (*)    Creatinine, Ser 1.07 (*)    Albumin 3.3 (*)    GFR, Estimated 54 (*)    All other components within normal limits  CBC WITH DIFFERENTIAL/PLATELET - Abnormal; Notable for the following components:   RBC 3.72 (*)    Hemoglobin 11.7 (*)    HCT 35.0 (*)    Lymphs Abs 0.6 (*)    All other components within normal limits  VALPROIC ACID LEVEL - Abnormal; Notable for the following components:   Valproic Acid Lvl 37 (*)    All other components within normal limits  RESP PANEL BY RT-PCR (FLU A&B, COVID) ARPGX2  PROTIME-INR  LIPASE, BLOOD  LACTIC ACID, PLASMA  LACTIC ACID, PLASMA  URINALYSIS, ROUTINE W REFLEX MICROSCOPIC  TROPONIN I (HIGH SENSITIVITY)  TROPONIN I (HIGH SENSITIVITY)    EKG EKG Interpretation  Date/Time:  Tuesday August 15 2021 18:04:49 EST Ventricular Rate:  104 PR Interval:  160 QRS Duration: 138 QT Interval:  382 QTC Calculation: 503 R Axis:   28 Text Interpretation: Sinus tachycardia Left bundle branch block Confirmed by Dene Gentry [phone]) on 08/15/2021 6:34:21 PM  Radiology CT Head Wo Contrast  Result Date: 08/15/2021 CLINICAL DATA:  Mental status EXAM: CT HEAD WITHOUT CONTRAST TECHNIQUE: Contiguous axial images were obtained from the base of the skull through the vertex without intravenous contrast. COMPARISON:  CT 05/20/2021 FINDINGS: Brain: No acute territorial infarction, hemorrhage, or intracranial mass. Atrophy and chronic small vessel ischemic changes of the white matter. Chronic left occipital and inferior temporal lobe encephalomalacia. Chronic cerebellar infarcts. Stable ventricle size. Vascular: No hyperdense vessels. Vertebral and carotid vascular calcification Skull: Normal. Negative for fracture or focal lesion. Sinuses/Orbits: No acute finding. Other: None IMPRESSION: 1. No CT evidence for acute intracranial abnormality. 2. Atrophy and chronic small vessel ischemic changes of the white matter. Remote left temporooccipital  infarct. Electronically Signed   By: Donavan Foil M.D.   On: 08/15/2021 19:32   DG Chest Port 1 View  Result Date: 08/15/2021 CLINICAL DATA:  Dyspnea, emesis EXAM: PORTABLE CHEST 1 VIEW COMPARISON:  05/19/2021 FINDINGS: The heart size and mediastinal contours are within normal limits. Aortic atherosclerosis. Both lungs are clear. No acute osseous abnormality. IMPRESSION: No active disease. Electronically Signed   By: Merilyn Baba M.D.   On: 08/15/2021 19:33    Procedures Procedures   Medications Ordered in ED Medications  sodium chloride 0.9 % bolus 500 mL (500 mLs Intravenous New Bag/Given 08/15/21 1949)  ondansetron (ZOFRAN) injection 4 mg (4 mg Intravenous Given 08/15/21 1949)    ED Course  I have reviewed the triage vital signs and the nursing notes.  Pertinent labs & imaging results that were available during my care of the patient were reviewed by me and considered in my medical decision making (see chart for details).    MDM Rules/Calculators/A&P                           MDM  MSE complete  Anna Hunt was evaluated in Emergency Department on 08/15/2021 for the symptoms described in the history of present illness. She was evaluated in the context of the global COVID-19 pandemic, which necessitated consideration that the patient might be at risk for infection with the SARS-CoV-2 virus that causes COVID-19. Institutional protocols and algorithms that pertain to the evaluation of patients at risk for COVID-19 are in a state of rapid change based on information released by regulatory bodies including the CDC and federal and state organizations. These policies and algorithms were followed during the patient's care in the ED.  Patient presents with complaint of episode of vomiting with associated episode of AMS. Symptoms are significantly improved on evaluation.  Patient with prior similar episode with resulting diagnosis of CVA.   CT head without acute findings.  MR ordered  to fully evaluate for possible CVA. Patient would not hold still for MR Brain.   Patient with recorded fever while in the ED - 101.5. Covid/flu and UA pending. Blood cultures obtained. Antibiotics held at this time.   Patient would benefit from admission. Hospitalist service is aware of case and pending studies.    Final Clinical Impression(s) / ED Diagnoses Final diagnoses:  Nausea and vomiting, unspecified vomiting type  Fever, unspecified fever cause    Rx / DC Orders ED Discharge Orders     None        Valarie Merino, MD 08/15/21 2239

## 2021-08-15 NOTE — ED Triage Notes (Signed)
Pt here via [facility] from [facility] for weakness following an episode of emesis. Pt went to lunch w/ her daughter and felt nauseous after, pt had 1 episode of emesis. Now pt reports she feels much better, denies any pain. Per staff pt was "more incoherent" and weak. EMS found pt on 2L O2 via [place], SpO2 97%. HR 104, 119/68, 24RR, CBG 218. 20g LAC, aox3

## 2021-08-15 NOTE — H&P (Signed)
History and Physical   SMT. LODER NLZ:[id number] DOB: 1946-01-13 DOA: 08/15/2021  PCP: Lawerance Cruel, MD   Patient coming from: Facility  Chief Complaint: Episode of emesis and altered mental status  HPI: Anna Hunt is a 75 y.o. female with medical history significant of breast cancer, depression, hypertension, HLD, CVA, seizure, diabetes, myelopathy who presents following episode of emesis and altered mental status.  Patient was out to eat with family for lunch today and had an episode where she vomited 5 times at the restaurant.  She was also noted to be more confused than usual and weaker than usual during that episode.  This is all now improved.  There were reports that she was no longer feeling very nauseous and her mental status appears to have improved to baseline and her weakness has also improved.  Family has some concern as this is similar to her presentation for previous CVA.  She also notably has history of seizure but did not have any seizure-like symptoms.  I spoke with her daughter who is at bedside and she stated that when she first arrived patient was still having some confusion and did not seem to recognize her.  She did seem to recognize her at the time I was in the room.  Patient is also had some staring spells the last couple days per her report and EDP reported this the patient was able to talk through it in the ED so less likely to be seizure.  Daughter also reported patient appeared to have some nausea while she was here as well.  Denies fevers, chills, chest pain, shortness of breath, abdominal pain, constipation, diarrhea.   ED Course: Vital signs in the ED significant for heart rate in the 90s to 100s.  Lab work-up showed CMP with glucose 173, albumin 3.3.  CBC showed hemoglobin stable 11.7.  PT and INR within normal limits.  Troponin normal with repeat pending.  Lactic acid elevated at 3 with repeat pending.  Lipase normal.  Respiratory panel for flu and  COVID pending.  Valproic acid level low at 37.  Blood cultures pending.  Chest x-ray showed no acute abnormality.  CT head showed no acute abnormality.  MR brain has been ordered and is pending.    Review of Systems: As per HPI otherwise all other systems reviewed and are negative.  Past Medical History:  Diagnosis Date   Breast cancer ([place])    Diabetes mellitus without complication ([place])    Displacement of cervical intervertebral disc without myelopathy    Family history of adverse reaction to anesthesia    per patient "younger sister was nauseated"   History of depression    Hypertension    LBBB (left bundle branch block)    Occipital stroke ([place]) - left    Unspecified cerebral artery occlusion with cerebral infarction    Vertigo     Past Surgical History:  Procedure Laterality Date   BACK SURGERY     BREAST LUMPECTOMY WITH RADIOACTIVE SEED LOCALIZATION Left 09/15/2020   Procedure: LEFT BREAST LUMPECTOMY WITH RADIOACTIVE SEED LOCALIZATION;  Surgeon: Rolm Bookbinder, MD;  Location: [facility];  Service: General;  Laterality: Left;   CATARACT EXTRACTION W/ INTRAOCULAR LENS  IMPLANT, BILATERAL     TUBAL LIGATION      Social History  reports that she has quit smoking. She has never used smokeless tobacco. She reports that she does not drink alcohol and does not use drugs.  Allergies  Allergen Reactions  Niaspan [Niacin Er] Hives and Swelling    Family History  Problem Relation Age of Onset   Heart attack Sister 60   Breast cancer Sister 26   Breast cancer Sister 63  Reviewed on admission  Prior to Admission medications   Medication Sig Start Date End Date Taking? Authorizing Provider  acetaminophen (TYLENOL) 500 MG tablet Take 1,000 mg by mouth every 6 (six) hours as needed for mild pain.    [provider]  anastrozole (ARIMIDEX) 1 MG tablet Take 1 tablet (1 mg total) by mouth daily. 08/24/20   Gardenia Phlegm, NP  BD PEN NEEDLE NANO U/F 32G X 4 MM MISC   12/26/12   [provider]  cholecalciferol (VITAMIN D3) 25 MCG (1000 UNIT) tablet Take 1,000 Units by mouth every morning.    [provider]  clopidogrel (PLAVIX) 75 MG tablet Take 1 tablet (75 mg total) by mouth daily. 11/17/18   Thurnell Lose, MD  divalproex (DEPAKOTE ER) 500 MG 24 hr tablet Take 1 tablet (500 mg total) by mouth daily. 05/24/21   Antonieta Pert, MD  divalproex (DEPAKOTE ER) 500 MG 24 hr tablet Take 1 tablet (500 mg total) by mouth at bedtime. 05/23/21   Antonieta Pert, MD  furosemide (LASIX) 20 MG tablet Take 20 mg by mouth every morning.    [provider]  insulin aspart (NOVOLOG FLEXPEN) 100 UNIT/ML FlexPen Inject 0-15 Units into the skin See admin instructions. Inject 0-15 units subcutaneously three times daily before meals and at bedtime per sliding scale: CBG 70-100 0 units, 151-250 2 units, 151 2-- 3 units, 201-250 5 units, 251-300 8 units, 301-350 11 units, 351-400 15 units, >400 call MD    [provider]  levothyroxine (SYNTHROID) 25 MCG tablet Take 25 mcg by mouth every morning.    [provider]  metFORMIN (GLUCOPHAGE-XR) 500 MG 24 hr tablet Take 500 mg by mouth at bedtime. 01/05/13   [provider]  Multiple Vitamin (MULTIVITAMIN WITH MINERALS) TABS tablet Take 1 tablet by mouth every morning.    [provider]  PARoxetine (PAXIL) 20 MG tablet Take 20 mg by mouth at bedtime.  01/05/13   [provider]  simvastatin (ZOCOR) 40 MG tablet Take 40 mg by mouth at bedtime.  01/05/13   [provider]  telmisartan (MICARDIS) 20 MG tablet Take 20 mg by mouth every morning.    [provider]  traMADol (ULTRAM) 50 MG tablet Take 1 tablet (50 mg total) by mouth every 6 (six) hours as needed. Patient taking differently: Take 50 mg by mouth every 8 (eight) hours as needed (pain). 12/29/20   Drenda Freeze, MD    Physical Exam: Vitals:   08/15/21 2000 08/15/21 2030 08/15/21 2100 08/15/21 2130   BP: 102/64 110/60 100/86 120/69  Pulse: 96 (!) 104 (!) 101 (!) 103  Resp: 20 (!) 21 (!) 21 (!) 21  Temp:      SpO2: 91% 91% 94% 94%   Physical Exam Constitutional:      General: She is not in acute distress.    Appearance: Normal appearance.  HENT:     Head: Normocephalic and atraumatic.     Mouth/Throat:     Mouth: Mucous membranes are moist.     Pharynx: Oropharynx is clear.  Eyes:     Extraocular Movements: Extraocular movements intact.     Pupils: Pupils are equal, round, and reactive to light.  Cardiovascular:     Rate and  Rhythm: Normal rate and regular rhythm.     Pulses: Normal pulses.     Heart sounds: Normal heart sounds.  Pulmonary:     Effort: Pulmonary effort is normal. No respiratory distress.     Breath sounds: Normal breath sounds.  Abdominal:     General: Bowel sounds are normal. There is no distension.     Palpations: Abdomen is soft.     Tenderness: There is no abdominal tenderness.  Musculoskeletal:        General: No swelling or deformity.  Skin:    General: Skin is warm and dry.  Neurological:     Mental Status: Mental status is at baseline.     Comments: Mental Status: Patient is awake, alert, oriented x3 No signs of aphasia or neglect Cranial Nerves: II: Pupils equal, round, and reactive to light.   III,IV, VI: EOMI without ptosis or diploplia.  V: Facial sensation is symmetric to light touch and  Temperature. VII: Facial movement is symmetric.  VIII: hearing is intact to voice X: Uvula elevates symmetrically XI: Shoulder shrug is symmetric. XII: tongue is midline without atrophy or fasciculations.  Motor: Good effort thorughout, at Least 5/5 bilateral UE, 5/5 bilateral lower extremitiy  Sensory: Sensation is grossly intact bilateral UEs & LEs Cerebellar: Finger-Nose intact bilalat     Labs on Admission: I have personally reviewed following labs and imaging studies  CBC: Recent Labs  Lab 08/15/21 1836  WBC 6.1  NEUTROABS 4.9  HGB  11.7*  HCT 35.0*  MCV 94.1  PLT 315    Basic Metabolic Panel: Recent Labs  Lab 08/15/21 1836  NA 135  K 4.6  CL 98  CO2 25  GLUCOSE 173*  BUN 21  CREATININE 1.07*  CALCIUM 9.3    GFR: CrCl cannot be calculated (Unknown ideal weight.).  Liver Function Tests: Recent Labs  Lab 08/15/21 1836  AST 38  ALT 17  ALKPHOS 49  BILITOT 0.7  PROT 6.8  ALBUMIN 3.3*    Urine analysis:    Component Value Date/Time   COLORURINE YELLOW 05/20/2021 0748   APPEARANCEUR CLEAR 05/20/2021 0748   LABSPEC 1.025 05/20/2021 0748   PHURINE 6.0 05/20/2021 0748   GLUCOSEU NEGATIVE 05/20/2021 0748   HGBUR TRACE (A) 05/20/2021 0748   BILIRUBINUR NEGATIVE 05/20/2021 0748   KETONESUR NEGATIVE 05/20/2021 0748   PROTEINUR NEGATIVE 05/20/2021 0748   UROBILINOGEN 1.0 06/01/2007 1926   NITRITE POSITIVE (A) 05/20/2021 0748   LEUKOCYTESUR MODERATE (A) 05/20/2021 0748    Radiological Exams on Admission: CT Head Wo Contrast  Result Date: 08/15/2021 CLINICAL DATA:  Mental status EXAM: CT HEAD WITHOUT CONTRAST TECHNIQUE: Contiguous axial images were obtained from the base of the skull through the vertex without intravenous contrast. COMPARISON:  CT 05/20/2021 FINDINGS: Brain: No acute territorial infarction, hemorrhage, or intracranial mass. Atrophy and chronic small vessel ischemic changes of the white matter. Chronic left occipital and inferior temporal lobe encephalomalacia. Chronic cerebellar infarcts. Stable ventricle size. Vascular: No hyperdense vessels. Vertebral and carotid vascular calcification Skull: Normal. Negative for fracture or focal lesion. Sinuses/Orbits: No acute finding. Other: None IMPRESSION: 1. No CT evidence for acute intracranial abnormality. 2. Atrophy and chronic small vessel ischemic changes of the white matter. Remote left temporooccipital infarct. Electronically Signed   By: Donavan Foil M.D.   On: 08/15/2021 19:32   DG Chest Port 1 View  Result Date: 08/15/2021 CLINICAL  DATA:  Dyspnea, emesis EXAM: PORTABLE CHEST 1 VIEW COMPARISON:  05/19/2021 FINDINGS: The heart size  and mediastinal contours are within normal limits. Aortic atherosclerosis. Both lungs are clear. No acute osseous abnormality. IMPRESSION: No active disease. Electronically Signed   By: Merilyn Baba M.D.   On: 08/15/2021 19:33    EKG: Independently reviewed.  Sinus tachycardia at 104 bpm.  Left bundle branch block similar to previous.  Assessment/Plan Principal Problem:   AMS (altered mental status) Active Problems:   Diabetes mellitus without complication (HCC)   HTN (hypertension), benign   Cerebrovascular accident (CVA) ([place])   History of depression   Hyperlipidemia  Episode of altered mental status and emesis > Patient had episode of emesis with altered mental status earlier today.  Also noted to be confused and weak at that time.  This has resolved. > Etiology possibly concerning for viral infection and fever as she had a fever in the ED.  Currently COVID and flu are pending. > Also possible stroke as this is an odd presentation however similar to her previous presentation when she had a stroke recently. > CT head without acute abnormality and MRI unable to be obtained initially due to patient movement, will try again later. > No focal deficit on exam.  Has some confusion at baseline but per family's still more confused than her baseline. > Did spike a fever so evaluating for viral etiology. - Monitor on telemetry - Follow-up flu and COVID panel, check full viral panel if negative - We will reattempt MRI for further evaluation - Allow for permissive hypertension in the meantime in case patient is positive for stroke - Supportive care with IV fluids and Tylenol as needed  Diabetes - SSI  History of CVA - Continue home simvastatin -T hinks Plavix may have been discontinued, will continue for now as there is a possibility of recurrent stroke.  Depression - Continue  Paxil  Hypertension - Allow for permissive hypertension for now  Hypothyroidism  - Continue home Synthroid  History of seizures > Valproic acid level in the ED subtherapeutic. - Continue Depakote  DVT prophylaxis: Lovenox  Code Status:   DNR  Family Communication:  Daughter, POA, Joy, updated at bedside.  Contact her with any updates. Disposition Plan:   Patient is from:  SNF  Anticipated DC to:  SNF  Anticipated DC date:  1 to 3 days  Anticipated DC barriers: None  Consults called:  None  Admission status:  Observation, telemetry   Severity of Illness: The appropriate patient status for this patient is OBSERVATION. Observation status is judged to be reasonable and necessary in order to provide the required intensity of service to ensure the patient's safety. The patient's presenting symptoms, physical exam findings, and initial radiographic and laboratory data in the context of their medical condition is felt to place them at decreased risk for further clinical deterioration. Furthermore, it is anticipated that the patient will be medically stable for discharge from the hospital within 2 midnights of admission.    Marcelyn Bruins MD [facility]  How to contact the [facility] Attending or Consulting provider [place] or covering provider during after hours [place], for this patient?   Check the care team in [facility] and look for a) attending/consulting [facility] provider listed and b) the [facility] team listed Log into [email or web address] and use [place]'s universal password to access. If you do not have the password, please contact the hospital operator. Locate the [facility] provider you are looking for under [facility] and page to a number that you can be directly reached. If  you still have difficulty reaching the provider, please page the [facility] (Director on Call) for the Hospitalists listed on amion for assistance.  08/15/2021, 11:35 PM

## 2021-08-16 ENCOUNTER — Observation Stay (HOSPITAL_COMMUNITY)

## 2021-08-16 DIAGNOSIS — R509 Fever, unspecified: Secondary | ICD-10-CM | POA: Diagnosis not present

## 2021-08-16 DIAGNOSIS — Z7902 Long term (current) use of antithrombotics/antiplatelets: Secondary | ICD-10-CM | POA: Diagnosis not present

## 2021-08-16 DIAGNOSIS — Z794 Long term (current) use of insulin: Secondary | ICD-10-CM | POA: Diagnosis not present

## 2021-08-16 DIAGNOSIS — A059 Bacterial foodborne intoxication, unspecified: Secondary | ICD-10-CM | POA: Diagnosis not present

## 2021-08-16 DIAGNOSIS — Z79890 Hormone replacement therapy (postmenopausal): Secondary | ICD-10-CM | POA: Diagnosis not present

## 2021-08-16 DIAGNOSIS — G9341 Metabolic encephalopathy: Secondary | ICD-10-CM | POA: Diagnosis present

## 2021-08-16 DIAGNOSIS — J9601 Acute respiratory failure with hypoxia: Secondary | ICD-10-CM | POA: Diagnosis not present

## 2021-08-16 DIAGNOSIS — Z7401 Bed confinement status: Secondary | ICD-10-CM | POA: Diagnosis not present

## 2021-08-16 DIAGNOSIS — N390 Urinary tract infection, site not specified: Secondary | ICD-10-CM | POA: Diagnosis not present

## 2021-08-16 DIAGNOSIS — E785 Hyperlipidemia, unspecified: Secondary | ICD-10-CM | POA: Diagnosis not present

## 2021-08-16 DIAGNOSIS — I69398 Other sequelae of cerebral infarction: Secondary | ICD-10-CM | POA: Diagnosis not present

## 2021-08-16 DIAGNOSIS — I69318 Other symptoms and signs involving cognitive functions following cerebral infarction: Secondary | ICD-10-CM | POA: Diagnosis not present

## 2021-08-16 DIAGNOSIS — Z8659 Personal history of other mental and behavioral disorders: Secondary | ICD-10-CM | POA: Diagnosis not present

## 2021-08-16 DIAGNOSIS — R4182 Altered mental status, unspecified: Secondary | ICD-10-CM | POA: Diagnosis not present

## 2021-08-16 DIAGNOSIS — Z8673 Personal history of transient ischemic attack (TIA), and cerebral infarction without residual deficits: Secondary | ICD-10-CM | POA: Diagnosis not present

## 2021-08-16 DIAGNOSIS — G928 Other toxic encephalopathy: Secondary | ICD-10-CM | POA: Diagnosis not present

## 2021-08-16 DIAGNOSIS — B9620 Unspecified Escherichia coli [E. coli] as the cause of diseases classified elsewhere: Secondary | ICD-10-CM | POA: Diagnosis not present

## 2021-08-16 DIAGNOSIS — Z79811 Long term (current) use of aromatase inhibitors: Secondary | ICD-10-CM | POA: Diagnosis not present

## 2021-08-16 DIAGNOSIS — Z66 Do not resuscitate: Secondary | ICD-10-CM | POA: Diagnosis not present

## 2021-08-16 DIAGNOSIS — I10 Essential (primary) hypertension: Secondary | ICD-10-CM | POA: Diagnosis not present

## 2021-08-16 DIAGNOSIS — R404 Transient alteration of awareness: Secondary | ICD-10-CM | POA: Diagnosis not present

## 2021-08-16 DIAGNOSIS — F0393 Unspecified dementia, unspecified severity, with mood disturbance: Secondary | ICD-10-CM | POA: Diagnosis not present

## 2021-08-16 DIAGNOSIS — Z87891 Personal history of nicotine dependence: Secondary | ICD-10-CM | POA: Diagnosis not present

## 2021-08-16 DIAGNOSIS — Z8249 Family history of ischemic heart disease and other diseases of the circulatory system: Secondary | ICD-10-CM | POA: Diagnosis not present

## 2021-08-16 DIAGNOSIS — E119 Type 2 diabetes mellitus without complications: Secondary | ICD-10-CM | POA: Diagnosis not present

## 2021-08-16 DIAGNOSIS — Z20822 Contact with and (suspected) exposure to covid-19: Secondary | ICD-10-CM | POA: Diagnosis not present

## 2021-08-16 DIAGNOSIS — A084 Viral intestinal infection, unspecified: Secondary | ICD-10-CM | POA: Diagnosis not present

## 2021-08-16 DIAGNOSIS — E039 Hypothyroidism, unspecified: Secondary | ICD-10-CM | POA: Diagnosis not present

## 2021-08-16 DIAGNOSIS — Z79899 Other long term (current) drug therapy: Secondary | ICD-10-CM | POA: Diagnosis not present

## 2021-08-16 DIAGNOSIS — R569 Unspecified convulsions: Secondary | ICD-10-CM | POA: Diagnosis not present

## 2021-08-16 DIAGNOSIS — Z803 Family history of malignant neoplasm of breast: Secondary | ICD-10-CM | POA: Diagnosis not present

## 2021-08-16 DIAGNOSIS — Z853 Personal history of malignant neoplasm of breast: Secondary | ICD-10-CM | POA: Diagnosis not present

## 2021-08-16 LAB — CBG MONITORING, ED
Glucose-Capillary: 107 mg/dL — ABNORMAL HIGH (ref 70–99)
Glucose-Capillary: 117 mg/dL — ABNORMAL HIGH (ref 70–99)
Glucose-Capillary: 117 mg/dL — ABNORMAL HIGH (ref 70–99)

## 2021-08-16 LAB — URINALYSIS, ROUTINE W REFLEX MICROSCOPIC
Bilirubin Urine: NEGATIVE
Glucose, UA: NEGATIVE mg/dL
Ketones, ur: NEGATIVE mg/dL
Nitrite: POSITIVE — AB
Protein, ur: NEGATIVE mg/dL
Specific Gravity, Urine: 1.015 (ref 1.005–1.030)
pH: 6 (ref 5.0–8.0)

## 2021-08-16 LAB — RESPIRATORY PANEL BY PCR

## 2021-08-16 LAB — URINALYSIS, MICROSCOPIC (REFLEX): WBC, UA: >50 WBC/hpf (ref 0–5)

## 2021-08-16 LAB — CBC
HCT: 29.8 % — ABNORMAL LOW (ref 36.0–46.0)
Hemoglobin: 9.7 g/dL — ABNORMAL LOW (ref 12.0–15.0)
MCH: 31 pg (ref 26.0–34.0)
MCHC: 32.6 g/dL (ref 30.0–36.0)
MCV: 95.2 fL (ref 80.0–100.0)
Platelets: 165 10*3/uL (ref 150–400)
RBC: 3.13 MIL/uL — ABNORMAL LOW (ref 3.87–5.11)
RDW: 12.4 % (ref 11.5–15.5)
WBC: 2.7 10*3/uL — ABNORMAL LOW (ref 4.0–10.5)
nRBC: 0 % (ref 0.0–0.2)

## 2021-08-16 LAB — COMPREHENSIVE METABOLIC PANEL
ALT: 14 U/L (ref 0–44)
AST: 31 U/L (ref 15–41)
Albumin: 2.7 g/dL — ABNORMAL LOW (ref 3.5–5.0)
Alkaline Phosphatase: 46 U/L (ref 38–126)
Anion gap: 7 (ref 5–15)
BUN: 18 mg/dL (ref 8–23)
CO2: 24 mmol/L (ref 22–32)
Calcium: 8.5 mg/dL — ABNORMAL LOW (ref 8.9–10.3)
Chloride: 105 mmol/L (ref 98–111)
Creatinine, Ser: 0.9 mg/dL (ref 0.44–1.00)
GFR, Estimated: >60 mL/min (ref >60)
Glucose, Bld: 116 mg/dL — ABNORMAL HIGH (ref 70–99)
Potassium: 4 mmol/L (ref 3.5–5.1)
Sodium: 136 mmol/L (ref 135–145)
Total Bilirubin: 0.3 mg/dL (ref 0.3–1.2)
Total Protein: 5.5 g/dL — ABNORMAL LOW (ref 6.5–8.1)

## 2021-08-16 LAB — TROPONIN I (HIGH SENSITIVITY): Troponin I (High Sensitivity): 7 ng/L (ref <18)

## 2021-08-16 LAB — GLUCOSE, CAPILLARY: Glucose-Capillary: 145 mg/dL — ABNORMAL HIGH (ref 70–99)

## 2021-08-16 LAB — LACTIC ACID, PLASMA: Lactic Acid, Venous: 1.6 mmol/L (ref 0.5–1.9)

## 2021-08-16 MED ORDER — HYDROXYZINE HCL 10 MG PO TABS
10.0000 mg | ORAL_TABLET | Freq: Three times a day (TID) | ORAL | Status: DC | PRN
  Administered 2021-08-16: 10 mg via ORAL
  Filled 2021-08-16 (×2): qty 1

## 2021-08-16 NOTE — ED Notes (Signed)
Pt's POA/daughter Caryl Asp [phone] asking to be contacted for care decisions.

## 2021-08-16 NOTE — ED Notes (Signed)
Provider at bedside

## 2021-08-16 NOTE — Evaluation (Signed)
Physical Therapy Evaluation and Discharge Patient Details Name: Anna Hunt MRN: [id number] DOB: 1946-04-16 Today's Date: 08/16/2021  History of Present Illness  Pt is a 75 y.o. F who presents 08/15/2021 with emesis, weakness and AMS. Stable head CT. MRI brain pending. Significant PMH: breast CA, depression, HTN, HLD, CVA, seizure, diabetes, myelopathy.  Clinical Impression  Prior to admission, pt lives at an ALF and requires assist for transfers and ADL's. Pt daughter reports she is nonambulatory. Pt likely close to her functional baseline. Requiring maximal assist for bed mobility. Unable to stand from tall stretcher height. Displays generalized weakness; no focal deficits noted. Pt presents with decreased cognition, poor sitting balance, gross weakness. Pt daughter politely declining follow up PT services as she states it has not been helpful in the past. No further acute PT needs. Thank you for this consult.      Recommendations for follow up therapy are one component of a multi-disciplinary discharge planning process, led by the attending physician.  Recommendations may be updated based on patient status, additional functional criteria and insurance authorization.  Follow Up Recommendations No PT follow up (return to ALF)    Assistance Recommended at Discharge Frequent or constant Supervision/Assistance  Functional Status Assessment Patient has had a recent decline in their functional status and/or demonstrates limited ability to make significant improvements in function in a reasonable and predictable amount of time  Equipment Recommendations  None recommended by PT    Recommendations for Other Services       Precautions / Restrictions Precautions Precautions: Fall Restrictions Weight Bearing Restrictions: No      Mobility  Bed Mobility Overal bed mobility: Needs Assistance Bed Mobility: Supine to Sit;Sit to Supine     Supine to sit: Max assist Sit to supine: Max assist    General bed mobility comments: Assist for BLE's and trunk on stretcher    Transfers                   General transfer comment: unable from tall stretcher height    Ambulation/Gait                  Stairs            Wheelchair Mobility    Modified Rankin (Stroke Patients Only) Modified Rankin (Stroke Patients Only) Pre-Morbid Rankin Score: Severe disability Modified Rankin: Severe disability     Balance Overall balance assessment: Needs assistance Sitting-balance support: Feet unsupported Sitting balance-Leahy Scale: Poor Sitting balance - Comments: right lateral lean, requiring minA                                     Pertinent Vitals/Pain Pain Assessment: Faces Faces Pain Scale: Hurts a little bit Pain Location: back Pain Descriptors / Indicators: Guarding Pain Intervention(s): Monitored during session    Home Living Family/patient expects to be discharged to:: Assisted living                   Additional Comments: [facility]    Prior Function Prior Level of Function : Needs assist             Mobility Comments: assist for stand pivot transfers, does not ambulate ADLs Comments: dependent ADL's; able to self feed with set up     Hand Dominance   Dominant Hand: Right    Extremity/Trunk Assessment   Upper Extremity Assessment Upper Extremity Assessment:  Generalized weakness    Lower Extremity Assessment Lower Extremity Assessment: Generalized weakness    Cervical / Trunk Assessment Cervical / Trunk Assessment: Kyphotic  Communication   Communication: No difficulties  Cognition Arousal/Alertness: Awake/alert Behavior During Therapy: Flat affect Overall Cognitive Status: Impaired/Different from baseline Area of Impairment: Orientation;Following commands;Problem solving                 Orientation Level: Disoriented to;Time;Situation     Following Commands: Follows one step  commands inconsistently     Problem Solving: Slow processing;Difficulty sequencing;Decreased initiation;Requires verbal cues General Comments: Pt not oriented to month/year, able to correctly guess with clues. Pt family reporting pt typically  much more interactive and "present," in conversations.        General Comments      Exercises     Assessment/Plan    PT Assessment Patient does not need any further PT services  PT Problem List         PT Treatment Interventions      PT Goals (Current goals can be found in the Care Plan section)  Acute Rehab PT Goals Patient Stated Goal: pt daughter would like to find out what happened PT Goal Formulation: All assessment and education complete, DC therapy    Frequency     Barriers to discharge        Co-evaluation               AM-PAC PT "6 Clicks" Mobility  Outcome Measure Help needed turning from your back to your side while in a flat bed without using bedrails?: A Lot Help needed moving from lying on your back to sitting on the side of a flat bed without using bedrails?: A Lot Help needed moving to and from a bed to a chair (including a wheelchair)?: Total Help needed standing up from a chair using your arms (e.g., wheelchair or bedside chair)?: Total Help needed to walk in hospital room?: Total Help needed climbing 3-5 steps with a railing? : Total 6 Click Score: 8    End of Session Equipment Utilized During Treatment: Oxygen Activity Tolerance: Patient tolerated treatment well Patient left: in bed;with call bell/phone within reach;with family/visitor present Nurse Communication: Mobility status PT Visit Diagnosis: Muscle weakness (generalized) (M62.81);Difficulty in walking, not elsewhere classified (R26.2)    Time: 8889-1694 PT Time Calculation (min) (ACUTE ONLY): 23 min   Charges:   PT Evaluation $PT Eval Moderate Complexity: 1 Mod PT Treatments $Therapeutic Activity: 8-22 mins        Wyona Almas, PT, DPT Acute Rehabilitation Services Pager [phone] Office [phone]   Deno Etienne 08/16/2021, 5:11 PM

## 2021-08-16 NOTE — Plan of Care (Signed)
  Problem: Nutrition: Goal: Adequate nutrition will be maintained 08/16/2021 2058 by Mickie Kay, RN Outcome: Progressing 08/16/2021 2058 by Mickie Kay, RN Outcome: Progressing

## 2021-08-16 NOTE — Progress Notes (Signed)
[facility] - [facility]) hospitalized hospice patient visit.   This is a current [facility] hospice patient from with a terminal diagnosis of cerebrovascular disease. Patient started experiencing symptoms -nausea, vomiting and altered LOC yesterday and was transported to the ED for evaluation. Hospice was not notified. Admitted on 08/15/2021 with Acute metabolic encephalopathy and possible stroke. Per Dr Gildardo Cranker with [facility] hospice, this is a related hospital admission.   Visited patient at bedside with both daughters present. Patient is sitting, alert and pleasant. She is oriented to person and place but otherwise mildly confused. Family reports her symptoms from yesterday were similar to when she had a stroke a few months ago.   VS: 97.9, 118/62, 79, 15, 99% 2LNC I/O: 1000/not recorded  Abnormal labs: 08/15/21 18:36 Glucose: 173 (H) Creatinine: 1.07 (H) Albumin: 3.3 (L) GFR, Estimated: 54 (L) Lactic Acid, Venous: 3.0 (HH) RBC: 3.72 (L) Hemoglobin: 11.7 (L) HCT: 35.0 (L) Lymphocyte #: 0.6 (L)  Diagnostics: CT head IMPRESSION: 1. No CT evidence for acute intracranial abnormality. 2. Atrophy and chronic small vessel ischemic changes of the white matter. Remote left temporooccipital infarct.  IV/PRN Medications: Atarax 10 mg PRN anxiety @ 11.17  Problem list: Acute metabolic encephalopathy-possibly in the setting of viral infection with mainly GI manifestations given fever.  Also possible stroke, an MRI was ordered by the admitting and currently pending.  She has not worked with PT.  Family tells me she is much improved than yesterday however not yet back to baseline   Active Problems Nausea, vomiting-possibly food poisoning versus GI bug.  This is improved.  No longer throwing up.  No diarrhea   Generalized weakness-possibly in the setting of acute illness, remains quite weak and far from her baseline.  She is from assisted living facility, PT evaluation pending    DM2-cover with sliding scale  Hyperlipidemia-continue statin  Prior CVA-continue statin, used to be on Plavix but apparently this was discontinued   Hypothyroidism-continue Synthroid   History of seizures-continue Depakote  Discharge planning: ongoing. When stable to DC return to facility  Family Contact: Spoke with daughters at bedside IDG: team updated Goals of care: DNR. Return to facility when stable for DC  Should patient need ambulance transport at discharge , please use [facility] as they contract with [facility] for all of our active hospice patients.   Please do not hesitate to call with questions.   Thank you,   Farrel Gordon, RN, [facility] Liaison   [phone]

## 2021-08-16 NOTE — Progress Notes (Signed)
PT Cancellation Note  Patient Details Name: Anna Hunt MRN: [id number] DOB: Dec 18, 1945   Cancelled Treatment:    Reason Eval/Treat Not Completed: Patient at procedure or test/unavailable (leaving for MRI) Wyona Almas, PT, DPT Acute Rehabilitation Services Pager [phone] Office [phone]    Deno Etienne 08/16/2021, 11:29 AM

## 2021-08-16 NOTE — Progress Notes (Signed)
PROGRESS NOTE  Anna Hunt [employer]:[id number] DOB: May 24, 1946 DOA: 08/15/2021 PCP: Lawerance Cruel, MD   LOS: 0 days   Brief Narrative / Interim history: Anna Hunt is a 75 y.o. female with medical history significant of breast cancer, depression, hypertension, HLD, CVA, seizure, diabetes, myelopathy who presents following episode of emesis and altered mental status.  She was out yesterday to eat lunch with a family, but after finishing she was feeling extremely weak, unsteady the had to have help from 2 other folks to put her back in the car.  They arrived back to her assisted living facility and had to call in a CNA to come assist the patient back to her room.  After she got back she started having significant nausea and vomiting, had at least 4-5 episodes.  She got so weak to the point that she was unable to walk and she was brought to the hospital.  Subjective / 24h Interval events: No longer nauseous this morning but continues to feel significantly weak.  Mildly confused  Assessment & Plan: Principal Problem Acute metabolic encephalopathy-possibly in the setting of viral infection with mainly GI manifestations given fever.  Also possible stroke, an MRI was ordered by the admitting and currently pending.  She has not worked with PT.  Family tells me she is much improved than yesterday however not yet back to baseline  Active Problems Nausea, vomiting-possibly food poisoning versus GI bug.  This is improved.  No longer throwing up.  No diarrhea  Generalized weakness-possibly in the setting of acute illness, remains quite weak and far from her baseline.  She is from assisted living facility, PT evaluation pending  DM2-cover with sliding scale  Hyperlipidemia-continue statin  Prior CVA-continue statin, used to be on Plavix but apparently this was discontinued  Hypothyroidism-continue Synthroid  History of seizures-continue Depakote  Scheduled Meds:  clopidogrel  75 mg Oral Daily    divalproex  500 mg Oral QHS   enoxaparin (LOVENOX) injection  40 mg Subcutaneous Q24H   insulin aspart  0-15 Units Subcutaneous TID WC   levothyroxine  25 mcg Oral q morning   PARoxetine  20 mg Oral QHS   simvastatin  40 mg Oral q1800   sodium chloride flush  3 mL Intravenous Q12H   Continuous Infusions: PRN Meds:.acetaminophen **OR** acetaminophen, hydrOXYzine  Diet Orders (From admission, onward)     Start     Ordered   08/15/21 2245  Diet heart healthy/carb modified Room service appropriate? Yes; Fluid consistency: Thin  Diet effective now       Question Answer Comment  Diet-HS Snack? Nothing   Room service appropriate? Yes   Fluid consistency: Thin      08/15/21 2245            DVT prophylaxis: enoxaparin (LOVENOX) injection 40 mg Start: 08/16/21 1000     Code Status: DNR  Family Communication: Daughter present at bedside  Status is: Observation  The patient will require care spanning > 2 midnights and should be moved to inpatient because: Persistent weakness, mild encephalopathy, not returned to baseline per family  Level of care: Telemetry Medical  Consultants:  None  Procedures:  none  Microbiology  none  Antimicrobials: none    Objective: Vitals:   08/16/21 1100 08/16/21 1236 08/16/21 1300 08/16/21 1400  BP: (!) 94/49 129/62 121/87 120/67  Pulse: 69 66 73 73  Resp: 19 18 20 20   Temp:      TempSrc:  SpO2: 99% 99% 95% 96%  Height:        Intake/Output Summary (Last 24 hours) at 08/16/2021 1445 Last data filed at 08/16/2021 4967 Gross per 24 hour  Intake 1000 ml  Output --  Net 1000 ml   There were no vitals filed for this visit.  Examination:  Constitutional: NAD Eyes: no scleral icterus ENMT: Mucous membranes are moist.  Neck: normal, supple Respiratory: clear to auscultation bilaterally, no wheezing, no crackles. Normal respiratory effort. Cardiovascular: Regular rate and rhythm, no murmurs / rubs / gallops. No LE edema.   Abdomen: non distended, no tenderness. Bowel sounds positive.  Musculoskeletal: no clubbing / cyanosis.  Skin: no rashes Neurologic: non focal   Data Reviewed: I have independently reviewed following labs and imaging studies   CBC: Recent Labs  Lab 08/15/21 1836  WBC 6.1  NEUTROABS 4.9  HGB 11.7*  HCT 35.0*  MCV 94.1  PLT 591   Basic Metabolic Panel: Recent Labs  Lab 08/15/21 1836  NA 135  K 4.6  CL 98  CO2 25  GLUCOSE 173*  BUN 21  CREATININE 1.07*  CALCIUM 9.3   Liver Function Tests: Recent Labs  Lab 08/15/21 1836  AST 38  ALT 17  ALKPHOS 49  BILITOT 0.7  PROT 6.8  ALBUMIN 3.3*   Coagulation Profile: Recent Labs  Lab 08/15/21 1836  INR 1.1   HbA1C: No results for input(s): HGBA1C in the last 72 hours. CBG: Recent Labs  Lab 08/16/21 0747 08/16/21 1234  GLUCAP 107* 117*    Recent Results (from the past 240 hour(s))  Resp Panel by RT-PCR (Flu A&B, Covid) Nasopharyngeal Swab     Status: None   Collection Time: 08/15/21  6:37 PM   Specimen: Nasopharyngeal Swab; Nasopharyngeal(NP) swabs in vial transport medium  Result Value Ref Range Status   SARS Coronavirus 2 by RT PCR NEGATIVE NEGATIVE Final    Comment: (NOTE) SARS-CoV-2 target nucleic acids are NOT DETECTED.  The SARS-CoV-2 RNA is generally detectable in upper respiratory specimens during the acute phase of infection. The lowest concentration of SARS-CoV-2 viral copies this assay can detect is 138 copies/mL. A negative result does not preclude SARS-Cov-2 infection and should not be used as the sole basis for treatment or other patient management decisions. A negative result may occur with  improper specimen collection/handling, submission of specimen other than nasopharyngeal swab, presence of viral mutation(s) within the areas targeted by this assay, and inadequate number of viral copies(<138 copies/mL). A negative result must be combined with clinical observations, patient history, and  epidemiological information. The expected result is Negative.  Fact Sheet for Patients:  EntrepreneurPulse.com.au  Fact Sheet for Healthcare Providers:  IncredibleEmployment.be  This test is no t yet approved or cleared by the [place] FDA and  has been authorized for detection and/or diagnosis of SARS-CoV-2 by FDA under an Emergency Use Authorization (EUA). This EUA will remain  in effect (meaning this test can be used) for the duration of the COVID-19 declaration under Section 564(b)(1) of the Act, 21 U.S.C.section 360bbb-3(b)(1), unless the authorization is terminated  or revoked sooner.       Influenza A by PCR NEGATIVE NEGATIVE Final   Influenza B by PCR NEGATIVE NEGATIVE Final    Comment: (NOTE) The Xpert Xpress SARS-CoV-2/FLU/RSV plus assay is intended as an aid in the diagnosis of influenza from Nasopharyngeal swab specimens and should not be used as a sole basis for treatment. Nasal washings and aspirates are unacceptable for Xpert  Xpress SARS-CoV-2/FLU/RSV testing.  Fact Sheet for Patients: EntrepreneurPulse.com.au  Fact Sheet for Healthcare Providers: IncredibleEmployment.be  This test is not yet approved or cleared by the [place] FDA and has been authorized for detection and/or diagnosis of SARS-CoV-2 by FDA under an Emergency Use Authorization (EUA). This EUA will remain in effect (meaning this test can be used) for the duration of the COVID-19 declaration under Section 564(b)(1) of the Act, 21 U.S.C. section 360bbb-3(b)(1), unless the authorization is terminated or revoked.  Performed at [facility], [place] [address], [place], [place] [address]   Respiratory (~20 pathogens) panel by PCR     Status: None   Collection Time: 08/16/21  7:55 AM   Specimen: Nasopharyngeal Swab; Respiratory  Result Value Ref Range Status   Adenovirus NOT DETECTED NOT DETECTED Final    Coronavirus 229E NOT DETECTED NOT DETECTED Final    Comment: (NOTE) The Coronavirus on the Respiratory Panel, DOES NOT test for the novel  Coronavirus (2019 nCoV)    Coronavirus HKU1 NOT DETECTED NOT DETECTED Final   Coronavirus NL63 NOT DETECTED NOT DETECTED Final   Coronavirus OC43 NOT DETECTED NOT DETECTED Final   Metapneumovirus NOT DETECTED NOT DETECTED Final   Rhinovirus / Enterovirus NOT DETECTED NOT DETECTED Final   Influenza A NOT DETECTED NOT DETECTED Final   Influenza B NOT DETECTED NOT DETECTED Final   Parainfluenza Virus 1 NOT DETECTED NOT DETECTED Final   Parainfluenza Virus 2 NOT DETECTED NOT DETECTED Final   Parainfluenza Virus 3 NOT DETECTED NOT DETECTED Final   Parainfluenza Virus 4 NOT DETECTED NOT DETECTED Final   Respiratory Syncytial Virus NOT DETECTED NOT DETECTED Final   Bordetella pertussis NOT DETECTED NOT DETECTED Final   Bordetella Parapertussis NOT DETECTED NOT DETECTED Final   Chlamydophila pneumoniae NOT DETECTED NOT DETECTED Final   Mycoplasma pneumoniae NOT DETECTED NOT DETECTED Final    Comment: Performed at [facility], [place]. [address] [place], [place] [address]     Radiology Studies: CT Head Wo Contrast  Result Date: 08/15/2021 CLINICAL DATA:  Mental status EXAM: CT HEAD WITHOUT CONTRAST TECHNIQUE: Contiguous axial images were obtained from the base of the skull through the vertex without intravenous contrast. COMPARISON:  CT 05/20/2021 FINDINGS: Brain: No acute territorial infarction, hemorrhage, or intracranial mass. Atrophy and chronic small vessel ischemic changes of the white matter. Chronic left occipital and inferior temporal lobe encephalomalacia. Chronic cerebellar infarcts. Stable ventricle size. Vascular: No hyperdense vessels. Vertebral and carotid vascular calcification Skull: Normal. Negative for fracture or focal lesion. Sinuses/Orbits: No acute finding. Other: None IMPRESSION: 1. No CT evidence for acute intracranial abnormality.  2. Atrophy and chronic small vessel ischemic changes of the white matter. Remote left temporooccipital infarct. Electronically Signed   By: Donavan Foil M.D.   On: 08/15/2021 19:32   DG Chest Port 1 View  Result Date: 08/15/2021 CLINICAL DATA:  Dyspnea, emesis EXAM: PORTABLE CHEST 1 VIEW COMPARISON:  05/19/2021 FINDINGS: The heart size and mediastinal contours are within normal limits. Aortic atherosclerosis. Both lungs are clear. No acute osseous abnormality. IMPRESSION: No active disease. Electronically Signed   By: Merilyn Baba M.D.   On: 08/15/2021 19:33     Marzetta Board, MD, PhD [facility]  Between 7 am - 7 pm I am available, please contact me via Amion (for emergencies) or Securechat (non urgent messages)  Between 7 pm - 7 am I am not available, please contact night coverage MD/APP via Amion

## 2021-08-16 NOTE — ED Notes (Signed)
Patient transported to MRI 

## 2021-08-16 NOTE — ED Notes (Signed)
Received verbal report from Joshua N RN at this time 

## 2021-08-16 NOTE — ED Notes (Signed)
Breakfast orders placed 

## 2021-08-17 DIAGNOSIS — G9341 Metabolic encephalopathy: Secondary | ICD-10-CM | POA: Diagnosis not present

## 2021-08-17 DIAGNOSIS — I10 Essential (primary) hypertension: Secondary | ICD-10-CM | POA: Diagnosis not present

## 2021-08-17 DIAGNOSIS — Z8659 Personal history of other mental and behavioral disorders: Secondary | ICD-10-CM | POA: Diagnosis not present

## 2021-08-17 DIAGNOSIS — E119 Type 2 diabetes mellitus without complications: Secondary | ICD-10-CM | POA: Diagnosis not present

## 2021-08-17 LAB — GLUCOSE, CAPILLARY
Glucose-Capillary: 105 mg/dL — ABNORMAL HIGH (ref 70–99)
Glucose-Capillary: 106 mg/dL — ABNORMAL HIGH (ref 70–99)

## 2021-08-17 LAB — CBC
HCT: 29.4 % — ABNORMAL LOW (ref 36.0–46.0)
Hemoglobin: 9.6 g/dL — ABNORMAL LOW (ref 12.0–15.0)
MCH: 31.3 pg (ref 26.0–34.0)
MCHC: 32.7 g/dL (ref 30.0–36.0)
MCV: 95.8 fL (ref 80.0–100.0)
Platelets: 151 10*3/uL (ref 150–400)
RBC: 3.07 MIL/uL — ABNORMAL LOW (ref 3.87–5.11)
RDW: 12.4 % (ref 11.5–15.5)
WBC: 2.9 10*3/uL — ABNORMAL LOW (ref 4.0–10.5)
nRBC: 0 % (ref 0.0–0.2)

## 2021-08-17 LAB — BASIC METABOLIC PANEL
Anion gap: 6 (ref 5–15)
BUN: 16 mg/dL (ref 8–23)
CO2: 27 mmol/L (ref 22–32)
Calcium: 8.6 mg/dL — ABNORMAL LOW (ref 8.9–10.3)
Chloride: 104 mmol/L (ref 98–111)
Creatinine, Ser: 0.9 mg/dL (ref 0.44–1.00)
GFR, Estimated: >60 mL/min (ref >60)
Glucose, Bld: 130 mg/dL — ABNORMAL HIGH (ref 70–99)
Potassium: 3.7 mmol/L (ref 3.5–5.1)
Sodium: 137 mmol/L (ref 135–145)

## 2021-08-17 MED ORDER — CEPHALEXIN 500 MG PO CAPS
500.0000 mg | ORAL_CAPSULE | Freq: Two times a day (BID) | ORAL | Status: DC
  Administered 2021-08-17: 500 mg via ORAL
  Filled 2021-08-17: qty 1

## 2021-08-17 MED ORDER — SIMVASTATIN 40 MG PO TABS: 40.0000 mg | ORAL_TABLET | Freq: Every day | ORAL | 0 refills | Status: DC

## 2021-08-17 MED ORDER — CEPHALEXIN 500 MG PO CAPS: 500.0000 mg | ORAL_CAPSULE | Freq: Two times a day (BID) | ORAL | 0 refills | Status: AC

## 2021-08-17 NOTE — NC FL2 (Signed)
[place] MEDICAID FL2 LEVEL OF CARE SCREENING TOOL     IDENTIFICATION  Patient Name: Anna Hunt Birthdate: 1945-11-23 Sex: female Admission Date (Current Location): 08/15/2021  [facility] and [place] Number:  Herbalist and Address:  [facility] [place], [place] [address], [place], [place] [address]      Provider Number: [id number]  Attending Physician Name and Address:  Caren Griffins, MD  Relative Name and Phone Number:       Current Level of Care: Hospital Recommended Level of Care: [place] Prior Approval Number:    Date Approved/Denied:   PASRR Number:    Discharge Plan: Other (Comment) (ALF)    Current Diagnoses: Patient Active Problem List   Diagnosis Date Noted   Acute metabolic encephalopathy 38/06/1750   AMS (altered mental status) 08/15/2021   COVID-19 virus infection 05/20/2021   Acute CVA (cerebrovascular accident) ([place]) 05/19/2021   Decreased GFR 05/19/2021   History of depression    Hyperlipidemia    Malnutrition of moderate degree 11/05/2020   Adult failure to thrive 11/02/2020   Type 2 diabetes mellitus with hyperglycemia ([place]) 11/02/2020   Normocytic anemia 11/02/2020   Acute UTI (urinary tract infection) 11/02/2020   Mild protein-calorie malnutrition ([place]) 11/02/2020   Failure to thrive in adult 11/02/2020   Urinary incontinence 10/11/2020   Cervical myelopathy ([place]) 06/24/2020   Genetic testing 06/23/2020   Cerebrovascular accident (CVA) ([place]) 06/23/2020   Gait abnormality 06/16/2020   Malignant neoplasm of upper-inner quadrant of left breast in female, estrogen receptor positive ([place]) 06/10/2020   TIA (transient ischemic attack) 11/16/2018   HTN (hypertension), benign 11/16/2018   LBBB (left bundle branch block)    Diabetes mellitus without complication ([place])    Stroke ([place]) 01/22/2013   Displacement of cervical intervertebral disc without myelopathy     Orientation RESPIRATION BLADDER Height  & Weight     Self, Time, Situation, Place  Normal Incontinent Weight:   Height:  5\' 3"  (160 cm)  BEHAVIORAL SYMPTOMS/MOOD NEUROLOGICAL BOWEL NUTRITION STATUS      Incontinent Diet (regular)  AMBULATORY STATUS COMMUNICATION OF NEEDS Skin   Extensive Assist Verbally Normal                       Personal Care Assistance Level of Assistance  Bathing, Feeding, Dressing Bathing Assistance: Maximum assistance Feeding assistance: Limited assistance Dressing Assistance: Maximum assistance     Functional Limitations Info             SPECIAL CARE FACTORS FREQUENCY                       Contractures Contractures Info: Not present    Additional Factors Info  Code Status, Allergies Code Status Info: DNR Allergies Info: Niaspan           Current Medications (08/17/2021):  This is the current hospital active medication list Current Facility-Administered Medications  Medication Dose Route Frequency Provider Last Rate Last Admin   acetaminophen (TYLENOL) tablet 650 mg  650 mg Oral Q6H PRN Marcelyn Bruins, MD       Or   acetaminophen (TYLENOL) suppository 650 mg  650 mg Rectal Q6H PRN Marcelyn Bruins, MD       cephALEXin [facility]) capsule 500 mg  500 mg Oral Q12H Caren Griffins, MD   500 mg at 08/17/21 0843   clopidogrel (PLAVIX) tablet 75 mg  75 mg Oral Daily Trilby Drummer,  Candace Gallus, MD   75 mg at 08/17/21 0843   divalproex (DEPAKOTE ER) 24 hr tablet 500 mg  500 mg Oral QHS Marcelyn Bruins, MD   500 mg at 08/16/21 2115   enoxaparin (LOVENOX) injection 40 mg  40 mg Subcutaneous Q24H Marcelyn Bruins, MD   40 mg at 08/17/21 3846   hydrOXYzine (ATARAX) tablet 10 mg  10 mg Oral TID PRN Caren Griffins, MD   10 mg at 08/16/21 1117   insulin aspart (novoLOG) injection 0-15 Units  0-15 Units Subcutaneous TID WC Marcelyn Bruins, MD       levothyroxine (SYNTHROID) tablet 25 mcg  25 mcg Oral q morning Marcelyn Bruins, MD   25 mcg at 08/17/21 0600   PARoxetine  (PAXIL) tablet 20 mg  20 mg Oral QHS Marcelyn Bruins, MD   20 mg at 08/16/21 2115   simvastatin (ZOCOR) tablet 40 mg  40 mg Oral q1800 Marcelyn Bruins, MD   40 mg at 08/16/21 1729   sodium chloride flush (NS) 0.9 % injection 3 mL  3 mL Intravenous Q12H Marcelyn Bruins, MD   3 mL at 08/17/21 0843     Discharge Medications: STOP taking these medications     traMADol 50 MG tablet Commonly known as: ULTRAM           TAKE these medications     anastrozole 1 MG tablet Commonly known as: ARIMIDEX Take 1 tablet (1 mg total) by mouth daily.    cephALEXin 500 MG capsule Commonly known as: KEFLEX Take 1 capsule (500 mg total) by mouth every 12 (twelve) hours for 3 days.    clopidogrel 75 MG tablet Commonly known as: PLAVIX Take 1 tablet (75 mg total) by mouth daily.    divalproex 250 MG 24 hr tablet Commonly known as: DEPAKOTE ER Take 250 mg by mouth in the morning. What changed: Another medication with the same name was removed. Continue taking this medication, and follow the directions you see here.    divalproex 250 MG 24 hr tablet Commonly known as: DEPAKOTE ER Take 500 mg by mouth at bedtime. What changed: Another medication with the same name was removed. Continue taking this medication, and follow the directions you see here.    furosemide 20 MG tablet Commonly known as: LASIX Take 20 mg by mouth every morning.    levothyroxine 25 MCG tablet Commonly known as: SYNTHROID Take 25 mcg by mouth every morning.    metFORMIN 500 MG 24 hr tablet Commonly known as: GLUCOPHAGE-XR Take 500 mg by mouth at bedtime.    NovoLOG FlexPen 100 UNIT/ML FlexPen Generic drug: insulin aspart Inject 0-15 Units into the skin See admin instructions. Inject 0-15 units subcutaneously three times daily before meals and at bedtime per sliding scale:  BG 70-120 = 0 units BG 121-150 = 2 units BG 151-200 = 3 units BG 201-250 = 5 units BG 251-300 = 8 units BG 301-350 = 11 units BG  351-400 = 15 units BG >400 = call MD    PARoxetine 20 MG tablet Commonly known as: PAXIL Take 20 mg by mouth at bedtime.    simvastatin 40 MG tablet Commonly known as: ZOCOR Take 1 tablet (40 mg total) by mouth daily at 6 PM. What changed: when to take this    telmisartan 20 MG tablet Commonly known as: MICARDIS Take 20 mg by mouth every morning.    Relevant Imaging Results:  Relevant Lab Results:  Additional Information SS#: [id number]  Geralynn Ochs, LCSW

## 2021-08-17 NOTE — Discharge Summary (Signed)
Physician Discharge Summary  Anna Hunt MNO:[id number] DOB: 1946-06-18 DOA: 08/15/2021  PCP: Anna Cruel, MD  Admit date: 08/15/2021 Discharge date: 08/17/2021  Admitted From: ALF Disposition:  ALF  Recommendations for Outpatient Follow-up:  Follow up with PCP in 1-2 weeks  Home Health: none Equipment/Devices: none  Discharge Condition: stable CODE STATUS: DNR Diet recommendation: regular  HPI: Per admitting MD, Anna Hunt is a 75 y.o. female with medical history significant of breast cancer, depression, hypertension, HLD, CVA, seizure, diabetes, myelopathy who presents following episode of emesis and altered mental status. Patient was out to eat with family for lunch today and had an episode where she vomited 5 times at the restaurant.  She was also noted to be more confused than usual and weaker than usual during that episode.  This is all now improved.  There were reports that she was no longer feeling very nauseous and her mental status appears to have improved to baseline and her weakness has also improved. Family has some concern as this is similar to her presentation for previous CVA.  She also notably has history of seizure but did not have any seizure-like symptoms. I spoke with her daughter who is at bedside and she stated that when she first arrived patient was still having some confusion and did not seem to recognize her.  She did seem to recognize her at the time I was in the room.  Patient is also had some staring spells the last couple days per her report and EDP reported this the patient was able to talk through it in the ED so less likely to be seizure.  Daughter also reported patient appeared to have some nausea while she was here as well. Denies fevers, chills, chest pain, shortness of breath, abdominal pain, constipation, diarrhea.   Hospital Course / Discharge diagnoses: Principal Problem Acute metabolic encephalopathy-possibly in the setting of viral  infection with mainly GI manifestations given fever.  Family highly concerned about the CVA event given similar presentation in the past with her having an acute stroke.  She underwent an MRI of the brain on admission which showed no acute infarct, hemorrhage or mass.  With symptomatic treatment she is improved, and returned to baseline.  She is no longer having nausea or vomiting.  Given prior CVA and risk of TIA she will be continued on Plavix and statin   Active Problems Nausea, vomiting-possibly food poisoning versus GI bug.  This is resolved, eating well  Generalized weakness-possibly in the setting of acute illness, PT evaluated and appears at baseline currently  DM2-continue home medications UTI-reports increased frequency, foul-smelling, placed on Keflex for 3 days.  Prior urine cultures with pansensitive E. coli.  Could certainly contribute to #1 #2 Hyperlipidemia-continue statin Prior CVA-continue plavix, statin Hypothyroidism-continue Synthroid History of seizures-continue Depakote  Sepsis ruled out   Discharge Instructions   Allergies as of 08/17/2021       Reactions   Niaspan [niacin Er] Hives, Swelling   Not included on MAR        Medication List     STOP taking these medications    traMADol 50 MG tablet Commonly known as: ULTRAM       TAKE these medications    anastrozole 1 MG tablet Commonly known as: ARIMIDEX Take 1 tablet (1 mg total) by mouth daily.   cephALEXin 500 MG capsule Commonly known as: KEFLEX Take 1 capsule (500 mg total) by mouth every 12 (twelve) hours for 3 days.  clopidogrel 75 MG tablet Commonly known as: PLAVIX Take 1 tablet (75 mg total) by mouth daily.   divalproex 250 MG 24 hr tablet Commonly known as: DEPAKOTE ER Take 250 mg by mouth in the morning. What changed: Another medication with the same name was removed. Continue taking this medication, and follow the directions you see here.   divalproex 250 MG 24 hr  tablet Commonly known as: DEPAKOTE ER Take 500 mg by mouth at bedtime. What changed: Another medication with the same name was removed. Continue taking this medication, and follow the directions you see here.   furosemide 20 MG tablet Commonly known as: LASIX Take 20 mg by mouth every morning.   levothyroxine 25 MCG tablet Commonly known as: SYNTHROID Take 25 mcg by mouth every morning.   metFORMIN 500 MG 24 hr tablet Commonly known as: GLUCOPHAGE-XR Take 500 mg by mouth at bedtime.   NovoLOG FlexPen 100 UNIT/ML FlexPen Generic drug: insulin aspart Inject 0-15 Units into the skin See admin instructions. Inject 0-15 units subcutaneously three times daily before meals and at bedtime per sliding scale:  BG 70-120 = 0 units BG 121-150 = 2 units BG 151-200 = 3 units BG 201-250 = 5 units BG 251-300 = 8 units BG 301-350 = 11 units BG 351-400 = 15 units BG >400 = call MD   PARoxetine 20 MG tablet Commonly known as: PAXIL Take 20 mg by mouth at bedtime.   simvastatin 40 MG tablet Commonly known as: ZOCOR Take 1 tablet (40 mg total) by mouth daily at 6 PM. What changed: when to take this   telmisartan 20 MG tablet Commonly known as: MICARDIS Take 20 mg by mouth every morning.       Consultations: none  Procedures/Studies:  CT Head Wo Contrast  Result Date: 08/15/2021 CLINICAL DATA:  Mental status EXAM: CT HEAD WITHOUT CONTRAST TECHNIQUE: Contiguous axial images were obtained from the base of the skull through the vertex without intravenous contrast. COMPARISON:  CT 05/20/2021 FINDINGS: Brain: No acute territorial infarction, hemorrhage, or intracranial mass. Atrophy and chronic small vessel ischemic changes of the white matter. Chronic left occipital and inferior temporal lobe encephalomalacia. Chronic cerebellar infarcts. Stable ventricle size. Vascular: No hyperdense vessels. Vertebral and carotid vascular calcification Skull: Normal. Negative for fracture or focal lesion.  Sinuses/Orbits: No acute finding. Other: None IMPRESSION: 1. No CT evidence for acute intracranial abnormality. 2. Atrophy and chronic small vessel ischemic changes of the white matter. Remote left temporooccipital infarct. Electronically Signed   By: Donavan Foil M.D.   On: 08/15/2021 19:32   MR Brain Wo Contrast (neuro protocol)  Result Date: 08/16/2021 CLINICAL DATA:  Mental status change, unknown cause EXAM: MRI HEAD WITHOUT CONTRAST TECHNIQUE: Multiplanar, multiecho pulse sequences of the brain and surrounding structures were obtained without intravenous contrast. COMPARISON:  05/19/2021 FINDINGS: Brain: There is no acute infarction or intracranial hemorrhage. There is no intracranial mass, mass effect, or edema. No extra-axial collection. Large chronic infarct of the left occipital lobe extending into parietal and temporal lobes. Chronic infarcts of the left pons and right cerebellum. Additional patchy and confluent areas of T2 hyperintensity in the supratentorial white matter probably reflect stable chronic microvascular ischemic changes. Prominence of the ventricles and sulci reflects stable parenchymal volume loss. Vascular: Major vessel flow voids at the skull base are preserved. Skull and upper cervical spine: Normal marrow signal is preserved. Sinuses/Orbits: Paranasal sinuses are aerated. Orbits are unremarkable. Other: Sella is unremarkable.  Mastoid air cells are clear.  IMPRESSION: No acute infarct, hemorrhage, or mass. Stable findings detailed above including chronic infarcts and chronic microvascular ischemic changes. Electronically Signed   By: Macy Mis M.D.   On: 08/16/2021 16:47   DG Chest Port 1 View  Result Date: 08/15/2021 CLINICAL DATA:  Dyspnea, emesis EXAM: PORTABLE CHEST 1 VIEW COMPARISON:  05/19/2021 FINDINGS: The heart size and mediastinal contours are within normal limits. Aortic atherosclerosis. Both lungs are clear. No acute osseous abnormality. IMPRESSION: No active  disease. Electronically Signed   By: Merilyn Baba M.D.   On: 08/15/2021 19:33     Subjective: - no chest pain, shortness of breath, no abdominal pain, nausea or vomiting.   Discharge Exam: BP 135/64 (BP Location: Right Arm)   Pulse 67   Temp 98 F (36.7 C) (Oral)   Resp 13   Ht 5\' 3"  (1.6 m)   SpO2 100%   BMI 30.11 kg/m   General: Pt is alert, awake, not in acute distress Cardiovascular: RRR, S1/S2 +, no rubs, no gallops Respiratory: CTA bilaterally, no wheezing, no rhonchi Abdominal: Soft, NT, ND, bowel sounds + Extremities: no edema, no cyanosis    The results of significant diagnostics from this hospitalization (including imaging, microbiology, ancillary and laboratory) are listed below for reference.     Microbiology: Recent Results (from the past 240 hour(s))  Resp Panel by RT-PCR (Flu A&B, Covid) Nasopharyngeal Swab     Status: None   Collection Time: 08/15/21  6:37 PM   Specimen: Nasopharyngeal Swab; Nasopharyngeal(NP) swabs in vial transport medium  Result Value Ref Range Status   SARS Coronavirus 2 by RT PCR NEGATIVE NEGATIVE Final    Comment: (NOTE) SARS-CoV-2 target nucleic acids are NOT DETECTED.  The SARS-CoV-2 RNA is generally detectable in upper respiratory specimens during the acute phase of infection. The lowest concentration of SARS-CoV-2 viral copies this assay can detect is 138 copies/mL. A negative result does not preclude SARS-Cov-2 infection and should not be used as the sole basis for treatment or other patient management decisions. A negative result may occur with  improper specimen collection/handling, submission of specimen other than nasopharyngeal swab, presence of viral mutation(s) within the areas targeted by this assay, and inadequate number of viral copies(<138 copies/mL). A negative result must be combined with clinical observations, patient history, and epidemiological information. The expected result is Negative.  Fact Sheet for  Patients:  EntrepreneurPulse.com.au  Fact Sheet for Healthcare Providers:  IncredibleEmployment.be  This test is no t yet approved or cleared by the [place] FDA and  has been authorized for detection and/or diagnosis of SARS-CoV-2 by FDA under an Emergency Use Authorization (EUA). This EUA will remain  in effect (meaning this test can be used) for the duration of the COVID-19 declaration under Section 564(b)(1) of the Act, 21 U.S.C.section 360bbb-3(b)(1), unless the authorization is terminated  or revoked sooner.       Influenza A by PCR NEGATIVE NEGATIVE Final   Influenza B by PCR NEGATIVE NEGATIVE Final    Comment: (NOTE) The Xpert Xpress SARS-CoV-2/FLU/RSV plus assay is intended as an aid in the diagnosis of influenza from Nasopharyngeal swab specimens and should not be used as a sole basis for treatment. Nasal washings and aspirates are unacceptable for Xpert Xpress SARS-CoV-2/FLU/RSV testing.  Fact Sheet for Patients: EntrepreneurPulse.com.au  Fact Sheet for Healthcare Providers: IncredibleEmployment.be  This test is not yet approved or cleared by the [place] FDA and has been authorized for detection and/or diagnosis of SARS-CoV-2 by FDA under an  Emergency Use Authorization (EUA). This EUA will remain in effect (meaning this test can be used) for the duration of the COVID-19 declaration under Section 564(b)(1) of the Act, 21 U.S.C. section 360bbb-3(b)(1), unless the authorization is terminated or revoked.  Performed at [facility], [place] [address] [place], [place] [address]   Respiratory (~20 pathogens) panel by PCR     Status: None   Collection Time: 08/16/21  7:55 AM   Specimen: Nasopharyngeal Swab; Respiratory  Result Value Ref Range Status   Adenovirus NOT DETECTED NOT DETECTED Final   Coronavirus 229E NOT DETECTED NOT DETECTED Final    Comment: (NOTE) The Coronavirus on  the Respiratory Panel, DOES NOT test for the novel  Coronavirus (2019 nCoV)    Coronavirus HKU1 NOT DETECTED NOT DETECTED Final   Coronavirus NL63 NOT DETECTED NOT DETECTED Final   Coronavirus OC43 NOT DETECTED NOT DETECTED Final   Metapneumovirus NOT DETECTED NOT DETECTED Final   Rhinovirus / Enterovirus NOT DETECTED NOT DETECTED Final   Influenza A NOT DETECTED NOT DETECTED Final   Influenza B NOT DETECTED NOT DETECTED Final   Parainfluenza Virus 1 NOT DETECTED NOT DETECTED Final   Parainfluenza Virus 2 NOT DETECTED NOT DETECTED Final   Parainfluenza Virus 3 NOT DETECTED NOT DETECTED Final   Parainfluenza Virus 4 NOT DETECTED NOT DETECTED Final   Respiratory Syncytial Virus NOT DETECTED NOT DETECTED Final   Bordetella pertussis NOT DETECTED NOT DETECTED Final   Bordetella Parapertussis NOT DETECTED NOT DETECTED Final   Chlamydophila pneumoniae NOT DETECTED NOT DETECTED Final   Mycoplasma pneumoniae NOT DETECTED NOT DETECTED Final    Comment: Performed at [facility], [place]. [address] [place], [place] [address]     Labs: Basic Metabolic Panel: Recent Labs  Lab 08/15/21 1836 08/16/21 1900 08/17/21 0304  NA 135 136 137  K 4.6 4.0 3.7  CL 98 105 104  CO2 25 24 27   GLUCOSE 173* 116* 130*  BUN 21 18 16   CREATININE 1.07* 0.90 0.90  CALCIUM 9.3 8.5* 8.6*   Liver Function Tests: Recent Labs  Lab 08/15/21 1836 08/16/21 1900  AST 38 31  ALT 17 14  ALKPHOS 49 46  BILITOT 0.7 0.3  PROT 6.8 5.5*  ALBUMIN 3.3* 2.7*   CBC: Recent Labs  Lab 08/15/21 1836 08/16/21 1900 08/17/21 0304  WBC 6.1 2.7* 2.9*  NEUTROABS 4.9  --   --   HGB 11.7* 9.7* 9.6*  HCT 35.0* 29.8* 29.4*  MCV 94.1 95.2 95.8  PLT 246 165 151   CBG: Recent Labs  Lab 08/16/21 0747 08/16/21 1234 08/16/21 1650 08/16/21 2111 08/17/21 0603  GLUCAP 107* 117* 117* 145* 105*   Hgb A1c No results for input(s): HGBA1C in the last 72 hours. Lipid Profile No results for input(s): CHOL, HDL, LDLCALC,  TRIG, CHOLHDL, LDLDIRECT in the last 72 hours. Thyroid function studies No results for input(s): TSH, T4TOTAL, T3FREE, THYROIDAB in the last 72 hours.  Invalid input(s): FREET3 Urinalysis    Component Value Date/Time   COLORURINE YELLOW 08/16/2021 [place] 08/16/2021 0635   LABSPEC 1.015 08/16/2021 0635   PHURINE 6.0 08/16/2021 0635   GLUCOSEU NEGATIVE 08/16/2021 0635   HGBUR TRACE (A) 08/16/2021 [place] 08/16/2021 0635   KETONESUR NEGATIVE 08/16/2021 0635   PROTEINUR NEGATIVE 08/16/2021 0635   UROBILINOGEN 1.0 06/01/2007 1926   NITRITE POSITIVE (A) 08/16/2021 0635   LEUKOCYTESUR LARGE (A) 08/16/2021 0635    FURTHER DISCHARGE INSTRUCTIONS:  Get Medicines reviewed and adjusted: Please take all your medications with you for your next visit with your Primary MD   Laboratory/radiological data: Please request your Primary MD to go over all hospital tests and procedure/radiological results at the follow up, please ask your Primary MD to get all Hospital records sent to his/her office.   In some cases, they will be blood work, cultures and biopsy results pending at the time of your discharge. Please request that your primary care M.D. goes through all the records of your hospital data and follows up on these results.   Also Note the following: If you experience worsening of your admission symptoms, develop shortness of breath, life threatening emergency, suicidal or homicidal thoughts you must seek medical attention immediately by calling 911 or calling your MD immediately  if symptoms less severe.   You must read complete instructions/literature along with all the possible adverse reactions/side effects for all the Medicines you take and that have been prescribed to you. Take any new Medicines after you have completely understood and accpet all the possible adverse reactions/side effects.    Do not drive when taking Pain medications or sleeping  medications (Benzodaizepines)   Do not take more than prescribed Pain, Sleep and Anxiety Medications. It is not advisable to combine anxiety,sleep and pain medications without talking with your primary care practitioner   Special Instructions: If you have smoked or chewed Tobacco  in the last 2 yrs please stop smoking, stop any regular Alcohol  and or any Recreational drug use.   Wear Seat belts while driving.   Please note: You were cared for by a hospitalist during your hospital stay. Once you are discharged, your primary care physician will handle any further medical issues. Please note that NO REFILLS for any discharge medications will be authorized once you are discharged, as it is imperative that you return to your primary care physician (or establish a relationship with a primary care physician if you do not have one) for your post hospital discharge needs so that they can reassess your need for medications and monitor your lab values.  Time coordinating discharge: 40 minutes  SIGNED:  Marzetta Board, MD, PhD 08/17/2021, 10:33 AM

## 2021-08-17 NOTE — TOC Transition Note (Signed)
Transition of Care [facility]) - CM/SW Discharge Note   Patient Details  Name: GENNELL HOW MRN: [id number] Date of Birth: 09/29/1945  Transition of Care [facility]) CM/SW Contact:  Geralynn Ochs, LCSW Phone Number: 08/17/2021, 11:40 AM   Clinical Narrative:   CSW spoke with [place] to discuss patient's return, sent paperwork and confirmed receipt. CSW spoke with [place] with [facility] to make sure they were aware of patient's discharge back to ALF. CSW also spoke with daughter, Caryl Asp, about return to ALF. CSW had received a message from [facility] asking about resuming home health services, but Caryl Asp not interested in this time. CSW notified [facility].   Nurse to call report to [phone].    Final next level of care: Assisted Living Barriers to Discharge: Barriers Resolved   Patient Goals and CMS Choice Patient states their goals for this hospitalization and ongoing recovery are:: patient unable to participate in goal setting, advanced dementia CMS Medicare.gov Compare Post Acute Care list provided to:: Patient Represenative (must comment) Choice offered to / list presented to : Adult Children  Discharge Placement              Patient [facility] bed at: [facility] Patient to be transferred to facility by: [place] Name of family member notified: Joy Patient and family notified of of transfer: 08/17/21  Discharge Plan and Services                                     Social Determinants of Health (SDOH) Interventions     Readmission Risk Interventions No flowsheet data found.

## 2021-08-17 NOTE — Progress Notes (Signed)
Discharge instructions (including medications) discussed with and copy provided to patient/caregiver. A copy also faxed to patient's facility. Report called. EMS here picking up patient now.

## 2021-08-18 DIAGNOSIS — I69318 Other symptoms and signs involving cognitive functions following cerebral infarction: Secondary | ICD-10-CM | POA: Diagnosis not present

## 2021-08-18 DIAGNOSIS — J9601 Acute respiratory failure with hypoxia: Secondary | ICD-10-CM | POA: Diagnosis not present

## 2021-08-18 DIAGNOSIS — G928 Other toxic encephalopathy: Secondary | ICD-10-CM | POA: Diagnosis not present

## 2021-08-18 DIAGNOSIS — I69398 Other sequelae of cerebral infarction: Secondary | ICD-10-CM | POA: Diagnosis not present

## 2021-08-18 DIAGNOSIS — N390 Urinary tract infection, site not specified: Secondary | ICD-10-CM | POA: Diagnosis not present

## 2021-08-18 DIAGNOSIS — B9620 Unspecified Escherichia coli [E. coli] as the cause of diseases classified elsewhere: Secondary | ICD-10-CM | POA: Diagnosis not present

## 2021-08-21 DIAGNOSIS — G928 Other toxic encephalopathy: Secondary | ICD-10-CM | POA: Diagnosis not present

## 2021-08-21 DIAGNOSIS — I69318 Other symptoms and signs involving cognitive functions following cerebral infarction: Secondary | ICD-10-CM | POA: Diagnosis not present

## 2021-08-21 DIAGNOSIS — B9620 Unspecified Escherichia coli [E. coli] as the cause of diseases classified elsewhere: Secondary | ICD-10-CM | POA: Diagnosis not present

## 2021-08-21 DIAGNOSIS — E039 Hypothyroidism, unspecified: Secondary | ICD-10-CM | POA: Diagnosis not present

## 2021-08-21 DIAGNOSIS — R627 Adult failure to thrive: Secondary | ICD-10-CM | POA: Diagnosis not present

## 2021-08-21 DIAGNOSIS — I10 Essential (primary) hypertension: Secondary | ICD-10-CM | POA: Diagnosis not present

## 2021-08-21 DIAGNOSIS — I69398 Other sequelae of cerebral infarction: Secondary | ICD-10-CM | POA: Diagnosis not present

## 2021-08-21 DIAGNOSIS — N390 Urinary tract infection, site not specified: Secondary | ICD-10-CM | POA: Diagnosis not present

## 2021-08-21 DIAGNOSIS — G9341 Metabolic encephalopathy: Secondary | ICD-10-CM | POA: Diagnosis not present

## 2021-08-21 DIAGNOSIS — J9601 Acute respiratory failure with hypoxia: Secondary | ICD-10-CM | POA: Diagnosis not present

## 2021-08-21 DIAGNOSIS — R42 Dizziness and giddiness: Secondary | ICD-10-CM | POA: Diagnosis not present

## 2021-08-21 DIAGNOSIS — D649 Anemia, unspecified: Secondary | ICD-10-CM | POA: Diagnosis not present

## 2021-08-21 LAB — CULTURE, BLOOD (ROUTINE X 2)
Culture: NO GROWTH
Culture: NO GROWTH
Special Requests: ADEQUATE

## 2021-08-22 DIAGNOSIS — E038 Other specified hypothyroidism: Secondary | ICD-10-CM | POA: Diagnosis not present

## 2021-08-22 DIAGNOSIS — E559 Vitamin D deficiency, unspecified: Secondary | ICD-10-CM | POA: Diagnosis not present

## 2021-08-22 DIAGNOSIS — D649 Anemia, unspecified: Secondary | ICD-10-CM | POA: Diagnosis not present

## 2021-08-22 DIAGNOSIS — G459 Transient cerebral ischemic attack, unspecified: Secondary | ICD-10-CM | POA: Diagnosis not present

## 2021-08-22 DIAGNOSIS — I10 Essential (primary) hypertension: Secondary | ICD-10-CM | POA: Diagnosis not present

## 2021-08-22 DIAGNOSIS — J9601 Acute respiratory failure with hypoxia: Secondary | ICD-10-CM | POA: Diagnosis not present

## 2021-08-22 DIAGNOSIS — N390 Urinary tract infection, site not specified: Secondary | ICD-10-CM | POA: Diagnosis not present

## 2021-08-22 DIAGNOSIS — B9620 Unspecified Escherichia coli [E. coli] as the cause of diseases classified elsewhere: Secondary | ICD-10-CM | POA: Diagnosis not present

## 2021-08-22 DIAGNOSIS — G928 Other toxic encephalopathy: Secondary | ICD-10-CM | POA: Diagnosis not present

## 2021-08-22 DIAGNOSIS — I69318 Other symptoms and signs involving cognitive functions following cerebral infarction: Secondary | ICD-10-CM | POA: Diagnosis not present

## 2021-08-22 DIAGNOSIS — E782 Mixed hyperlipidemia: Secondary | ICD-10-CM | POA: Diagnosis not present

## 2021-08-22 DIAGNOSIS — I69398 Other sequelae of cerebral infarction: Secondary | ICD-10-CM | POA: Diagnosis not present

## 2021-08-22 DIAGNOSIS — E119 Type 2 diabetes mellitus without complications: Secondary | ICD-10-CM | POA: Diagnosis not present

## 2021-08-22 DIAGNOSIS — D518 Other vitamin B12 deficiency anemias: Secondary | ICD-10-CM | POA: Diagnosis not present

## 2021-08-22 DIAGNOSIS — C50919 Malignant neoplasm of unspecified site of unspecified female breast: Secondary | ICD-10-CM | POA: Diagnosis not present

## 2021-08-22 DIAGNOSIS — E039 Hypothyroidism, unspecified: Secondary | ICD-10-CM | POA: Diagnosis not present

## 2021-08-23 DIAGNOSIS — I69318 Other symptoms and signs involving cognitive functions following cerebral infarction: Secondary | ICD-10-CM | POA: Diagnosis not present

## 2021-08-23 DIAGNOSIS — G928 Other toxic encephalopathy: Secondary | ICD-10-CM | POA: Diagnosis not present

## 2021-08-23 DIAGNOSIS — I69398 Other sequelae of cerebral infarction: Secondary | ICD-10-CM | POA: Diagnosis not present

## 2021-08-23 DIAGNOSIS — N390 Urinary tract infection, site not specified: Secondary | ICD-10-CM | POA: Diagnosis not present

## 2021-08-23 DIAGNOSIS — B9620 Unspecified Escherichia coli [E. coli] as the cause of diseases classified elsewhere: Secondary | ICD-10-CM | POA: Diagnosis not present

## 2021-08-23 DIAGNOSIS — J9601 Acute respiratory failure with hypoxia: Secondary | ICD-10-CM | POA: Diagnosis not present

## 2021-08-24 DIAGNOSIS — I69318 Other symptoms and signs involving cognitive functions following cerebral infarction: Secondary | ICD-10-CM | POA: Diagnosis not present

## 2021-08-24 DIAGNOSIS — B9620 Unspecified Escherichia coli [E. coli] as the cause of diseases classified elsewhere: Secondary | ICD-10-CM | POA: Diagnosis not present

## 2021-08-24 DIAGNOSIS — J9601 Acute respiratory failure with hypoxia: Secondary | ICD-10-CM | POA: Diagnosis not present

## 2021-08-24 DIAGNOSIS — G928 Other toxic encephalopathy: Secondary | ICD-10-CM | POA: Diagnosis not present

## 2021-08-24 DIAGNOSIS — I69398 Other sequelae of cerebral infarction: Secondary | ICD-10-CM | POA: Diagnosis not present

## 2021-08-24 DIAGNOSIS — N390 Urinary tract infection, site not specified: Secondary | ICD-10-CM | POA: Diagnosis not present

## 2021-08-25 DIAGNOSIS — I69398 Other sequelae of cerebral infarction: Secondary | ICD-10-CM | POA: Diagnosis not present

## 2021-08-25 DIAGNOSIS — J9601 Acute respiratory failure with hypoxia: Secondary | ICD-10-CM | POA: Diagnosis not present

## 2021-08-25 DIAGNOSIS — N390 Urinary tract infection, site not specified: Secondary | ICD-10-CM | POA: Diagnosis not present

## 2021-08-25 DIAGNOSIS — G928 Other toxic encephalopathy: Secondary | ICD-10-CM | POA: Diagnosis not present

## 2021-08-25 DIAGNOSIS — B9620 Unspecified Escherichia coli [E. coli] as the cause of diseases classified elsewhere: Secondary | ICD-10-CM | POA: Diagnosis not present

## 2021-08-25 DIAGNOSIS — I69318 Other symptoms and signs involving cognitive functions following cerebral infarction: Secondary | ICD-10-CM | POA: Diagnosis not present

## 2021-08-28 DIAGNOSIS — N390 Urinary tract infection, site not specified: Secondary | ICD-10-CM | POA: Diagnosis not present

## 2021-08-28 DIAGNOSIS — G928 Other toxic encephalopathy: Secondary | ICD-10-CM | POA: Diagnosis not present

## 2021-08-28 DIAGNOSIS — I69398 Other sequelae of cerebral infarction: Secondary | ICD-10-CM | POA: Diagnosis not present

## 2021-08-28 DIAGNOSIS — B9620 Unspecified Escherichia coli [E. coli] as the cause of diseases classified elsewhere: Secondary | ICD-10-CM | POA: Diagnosis not present

## 2021-08-28 DIAGNOSIS — J9601 Acute respiratory failure with hypoxia: Secondary | ICD-10-CM | POA: Diagnosis not present

## 2021-08-28 DIAGNOSIS — I69318 Other symptoms and signs involving cognitive functions following cerebral infarction: Secondary | ICD-10-CM | POA: Diagnosis not present

## 2021-08-29 DIAGNOSIS — J9601 Acute respiratory failure with hypoxia: Secondary | ICD-10-CM | POA: Diagnosis not present

## 2021-08-29 DIAGNOSIS — I69318 Other symptoms and signs involving cognitive functions following cerebral infarction: Secondary | ICD-10-CM | POA: Diagnosis not present

## 2021-08-29 DIAGNOSIS — N390 Urinary tract infection, site not specified: Secondary | ICD-10-CM | POA: Diagnosis not present

## 2021-08-29 DIAGNOSIS — G928 Other toxic encephalopathy: Secondary | ICD-10-CM | POA: Diagnosis not present

## 2021-08-29 DIAGNOSIS — I69398 Other sequelae of cerebral infarction: Secondary | ICD-10-CM | POA: Diagnosis not present

## 2021-08-29 DIAGNOSIS — B9620 Unspecified Escherichia coli [E. coli] as the cause of diseases classified elsewhere: Secondary | ICD-10-CM | POA: Diagnosis not present

## 2021-08-30 DIAGNOSIS — G928 Other toxic encephalopathy: Secondary | ICD-10-CM | POA: Diagnosis not present

## 2021-08-30 DIAGNOSIS — J9601 Acute respiratory failure with hypoxia: Secondary | ICD-10-CM | POA: Diagnosis not present

## 2021-08-30 DIAGNOSIS — I69318 Other symptoms and signs involving cognitive functions following cerebral infarction: Secondary | ICD-10-CM | POA: Diagnosis not present

## 2021-08-30 DIAGNOSIS — B9620 Unspecified Escherichia coli [E. coli] as the cause of diseases classified elsewhere: Secondary | ICD-10-CM | POA: Diagnosis not present

## 2021-08-30 DIAGNOSIS — N390 Urinary tract infection, site not specified: Secondary | ICD-10-CM | POA: Diagnosis not present

## 2021-08-30 DIAGNOSIS — I69398 Other sequelae of cerebral infarction: Secondary | ICD-10-CM | POA: Diagnosis not present

## 2021-08-31 DIAGNOSIS — J9601 Acute respiratory failure with hypoxia: Secondary | ICD-10-CM | POA: Diagnosis not present

## 2021-08-31 DIAGNOSIS — I69398 Other sequelae of cerebral infarction: Secondary | ICD-10-CM | POA: Diagnosis not present

## 2021-08-31 DIAGNOSIS — B9620 Unspecified Escherichia coli [E. coli] as the cause of diseases classified elsewhere: Secondary | ICD-10-CM | POA: Diagnosis not present

## 2021-08-31 DIAGNOSIS — N390 Urinary tract infection, site not specified: Secondary | ICD-10-CM | POA: Diagnosis not present

## 2021-08-31 DIAGNOSIS — I69318 Other symptoms and signs involving cognitive functions following cerebral infarction: Secondary | ICD-10-CM | POA: Diagnosis not present

## 2021-08-31 DIAGNOSIS — G928 Other toxic encephalopathy: Secondary | ICD-10-CM | POA: Diagnosis not present

## 2021-09-04 DIAGNOSIS — I69398 Other sequelae of cerebral infarction: Secondary | ICD-10-CM | POA: Diagnosis not present

## 2021-09-04 DIAGNOSIS — N390 Urinary tract infection, site not specified: Secondary | ICD-10-CM | POA: Diagnosis not present

## 2021-09-04 DIAGNOSIS — J9601 Acute respiratory failure with hypoxia: Secondary | ICD-10-CM | POA: Diagnosis not present

## 2021-09-04 DIAGNOSIS — B9620 Unspecified Escherichia coli [E. coli] as the cause of diseases classified elsewhere: Secondary | ICD-10-CM | POA: Diagnosis not present

## 2021-09-04 DIAGNOSIS — G928 Other toxic encephalopathy: Secondary | ICD-10-CM | POA: Diagnosis not present

## 2021-09-04 DIAGNOSIS — I69318 Other symptoms and signs involving cognitive functions following cerebral infarction: Secondary | ICD-10-CM | POA: Diagnosis not present

## 2021-09-05 DIAGNOSIS — J9601 Acute respiratory failure with hypoxia: Secondary | ICD-10-CM | POA: Diagnosis not present

## 2021-09-05 DIAGNOSIS — G928 Other toxic encephalopathy: Secondary | ICD-10-CM | POA: Diagnosis not present

## 2021-09-05 DIAGNOSIS — I69398 Other sequelae of cerebral infarction: Secondary | ICD-10-CM | POA: Diagnosis not present

## 2021-09-05 DIAGNOSIS — N390 Urinary tract infection, site not specified: Secondary | ICD-10-CM | POA: Diagnosis not present

## 2021-09-05 DIAGNOSIS — B9620 Unspecified Escherichia coli [E. coli] as the cause of diseases classified elsewhere: Secondary | ICD-10-CM | POA: Diagnosis not present

## 2021-09-05 DIAGNOSIS — I69318 Other symptoms and signs involving cognitive functions following cerebral infarction: Secondary | ICD-10-CM | POA: Diagnosis not present

## 2021-09-06 DIAGNOSIS — J9601 Acute respiratory failure with hypoxia: Secondary | ICD-10-CM | POA: Diagnosis not present

## 2021-09-06 DIAGNOSIS — N390 Urinary tract infection, site not specified: Secondary | ICD-10-CM | POA: Diagnosis not present

## 2021-09-06 DIAGNOSIS — B9620 Unspecified Escherichia coli [E. coli] as the cause of diseases classified elsewhere: Secondary | ICD-10-CM | POA: Diagnosis not present

## 2021-09-06 DIAGNOSIS — I10 Essential (primary) hypertension: Secondary | ICD-10-CM | POA: Diagnosis not present

## 2021-09-06 DIAGNOSIS — I69398 Other sequelae of cerebral infarction: Secondary | ICD-10-CM | POA: Diagnosis not present

## 2021-09-06 DIAGNOSIS — G928 Other toxic encephalopathy: Secondary | ICD-10-CM | POA: Diagnosis not present

## 2021-09-06 DIAGNOSIS — I69318 Other symptoms and signs involving cognitive functions following cerebral infarction: Secondary | ICD-10-CM | POA: Diagnosis not present

## 2021-09-07 DIAGNOSIS — I69398 Other sequelae of cerebral infarction: Secondary | ICD-10-CM | POA: Diagnosis not present

## 2021-09-07 DIAGNOSIS — B9620 Unspecified Escherichia coli [E. coli] as the cause of diseases classified elsewhere: Secondary | ICD-10-CM | POA: Diagnosis not present

## 2021-09-07 DIAGNOSIS — J9601 Acute respiratory failure with hypoxia: Secondary | ICD-10-CM | POA: Diagnosis not present

## 2021-09-07 DIAGNOSIS — N390 Urinary tract infection, site not specified: Secondary | ICD-10-CM | POA: Diagnosis not present

## 2021-09-07 DIAGNOSIS — I69318 Other symptoms and signs involving cognitive functions following cerebral infarction: Secondary | ICD-10-CM | POA: Diagnosis not present

## 2021-09-07 DIAGNOSIS — G928 Other toxic encephalopathy: Secondary | ICD-10-CM | POA: Diagnosis not present

## 2021-09-10 DIAGNOSIS — Z8616 Personal history of COVID-19: Secondary | ICD-10-CM | POA: Diagnosis not present

## 2021-09-10 DIAGNOSIS — D649 Anemia, unspecified: Secondary | ICD-10-CM | POA: Diagnosis not present

## 2021-09-10 DIAGNOSIS — I69398 Other sequelae of cerebral infarction: Secondary | ICD-10-CM | POA: Diagnosis not present

## 2021-09-10 DIAGNOSIS — C50912 Malignant neoplasm of unspecified site of left female breast: Secondary | ICD-10-CM | POA: Diagnosis not present

## 2021-09-10 DIAGNOSIS — D709 Neutropenia, unspecified: Secondary | ICD-10-CM | POA: Diagnosis not present

## 2021-09-10 DIAGNOSIS — E119 Type 2 diabetes mellitus without complications: Secondary | ICD-10-CM | POA: Diagnosis not present

## 2021-09-10 DIAGNOSIS — J9601 Acute respiratory failure with hypoxia: Secondary | ICD-10-CM | POA: Diagnosis not present

## 2021-09-10 DIAGNOSIS — E039 Hypothyroidism, unspecified: Secondary | ICD-10-CM | POA: Diagnosis not present

## 2021-09-10 DIAGNOSIS — M5000 Cervical disc disorder with myelopathy, unspecified cervical region: Secondary | ICD-10-CM | POA: Diagnosis not present

## 2021-09-10 DIAGNOSIS — R42 Dizziness and giddiness: Secondary | ICD-10-CM | POA: Diagnosis not present

## 2021-09-10 DIAGNOSIS — G928 Other toxic encephalopathy: Secondary | ICD-10-CM | POA: Diagnosis not present

## 2021-09-10 DIAGNOSIS — N390 Urinary tract infection, site not specified: Secondary | ICD-10-CM | POA: Diagnosis not present

## 2021-09-10 DIAGNOSIS — G40909 Epilepsy, unspecified, not intractable, without status epilepticus: Secondary | ICD-10-CM | POA: Diagnosis not present

## 2021-09-10 DIAGNOSIS — I69318 Other symptoms and signs involving cognitive functions following cerebral infarction: Secondary | ICD-10-CM | POA: Diagnosis not present

## 2021-09-10 DIAGNOSIS — H1132 Conjunctival hemorrhage, left eye: Secondary | ICD-10-CM | POA: Diagnosis not present

## 2021-09-10 DIAGNOSIS — B9620 Unspecified Escherichia coli [E. coli] as the cause of diseases classified elsewhere: Secondary | ICD-10-CM | POA: Diagnosis not present

## 2021-09-10 DIAGNOSIS — F32A Depression, unspecified: Secondary | ICD-10-CM | POA: Diagnosis not present

## 2021-09-10 DIAGNOSIS — E43 Unspecified severe protein-calorie malnutrition: Secondary | ICD-10-CM | POA: Diagnosis not present

## 2021-09-10 DIAGNOSIS — E785 Hyperlipidemia, unspecified: Secondary | ICD-10-CM | POA: Diagnosis not present

## 2021-09-10 DIAGNOSIS — I10 Essential (primary) hypertension: Secondary | ICD-10-CM | POA: Diagnosis not present

## 2021-09-11 DIAGNOSIS — G928 Other toxic encephalopathy: Secondary | ICD-10-CM | POA: Diagnosis not present

## 2021-09-11 DIAGNOSIS — N390 Urinary tract infection, site not specified: Secondary | ICD-10-CM | POA: Diagnosis not present

## 2021-09-11 DIAGNOSIS — I69318 Other symptoms and signs involving cognitive functions following cerebral infarction: Secondary | ICD-10-CM | POA: Diagnosis not present

## 2021-09-11 DIAGNOSIS — B9620 Unspecified Escherichia coli [E. coli] as the cause of diseases classified elsewhere: Secondary | ICD-10-CM | POA: Diagnosis not present

## 2021-09-11 DIAGNOSIS — I69398 Other sequelae of cerebral infarction: Secondary | ICD-10-CM | POA: Diagnosis not present

## 2021-09-11 DIAGNOSIS — J9601 Acute respiratory failure with hypoxia: Secondary | ICD-10-CM | POA: Diagnosis not present

## 2021-09-12 DIAGNOSIS — I69398 Other sequelae of cerebral infarction: Secondary | ICD-10-CM | POA: Diagnosis not present

## 2021-09-12 DIAGNOSIS — G928 Other toxic encephalopathy: Secondary | ICD-10-CM | POA: Diagnosis not present

## 2021-09-12 DIAGNOSIS — B9620 Unspecified Escherichia coli [E. coli] as the cause of diseases classified elsewhere: Secondary | ICD-10-CM | POA: Diagnosis not present

## 2021-09-12 DIAGNOSIS — J9601 Acute respiratory failure with hypoxia: Secondary | ICD-10-CM | POA: Diagnosis not present

## 2021-09-12 DIAGNOSIS — N390 Urinary tract infection, site not specified: Secondary | ICD-10-CM | POA: Diagnosis not present

## 2021-09-12 DIAGNOSIS — I69318 Other symptoms and signs involving cognitive functions following cerebral infarction: Secondary | ICD-10-CM | POA: Diagnosis not present

## 2021-09-13 DIAGNOSIS — G928 Other toxic encephalopathy: Secondary | ICD-10-CM | POA: Diagnosis not present

## 2021-09-13 DIAGNOSIS — J9601 Acute respiratory failure with hypoxia: Secondary | ICD-10-CM | POA: Diagnosis not present

## 2021-09-13 DIAGNOSIS — B9620 Unspecified Escherichia coli [E. coli] as the cause of diseases classified elsewhere: Secondary | ICD-10-CM | POA: Diagnosis not present

## 2021-09-13 DIAGNOSIS — I69398 Other sequelae of cerebral infarction: Secondary | ICD-10-CM | POA: Diagnosis not present

## 2021-09-13 DIAGNOSIS — N390 Urinary tract infection, site not specified: Secondary | ICD-10-CM | POA: Diagnosis not present

## 2021-09-13 DIAGNOSIS — I69318 Other symptoms and signs involving cognitive functions following cerebral infarction: Secondary | ICD-10-CM | POA: Diagnosis not present

## 2021-09-14 DIAGNOSIS — J9601 Acute respiratory failure with hypoxia: Secondary | ICD-10-CM | POA: Diagnosis not present

## 2021-09-14 DIAGNOSIS — N390 Urinary tract infection, site not specified: Secondary | ICD-10-CM | POA: Diagnosis not present

## 2021-09-14 DIAGNOSIS — B9620 Unspecified Escherichia coli [E. coli] as the cause of diseases classified elsewhere: Secondary | ICD-10-CM | POA: Diagnosis not present

## 2021-09-14 DIAGNOSIS — G928 Other toxic encephalopathy: Secondary | ICD-10-CM | POA: Diagnosis not present

## 2021-09-14 DIAGNOSIS — I69318 Other symptoms and signs involving cognitive functions following cerebral infarction: Secondary | ICD-10-CM | POA: Diagnosis not present

## 2021-09-14 DIAGNOSIS — I69398 Other sequelae of cerebral infarction: Secondary | ICD-10-CM | POA: Diagnosis not present

## 2021-09-15 DIAGNOSIS — I69398 Other sequelae of cerebral infarction: Secondary | ICD-10-CM | POA: Diagnosis not present

## 2021-09-15 DIAGNOSIS — B9620 Unspecified Escherichia coli [E. coli] as the cause of diseases classified elsewhere: Secondary | ICD-10-CM | POA: Diagnosis not present

## 2021-09-15 DIAGNOSIS — G928 Other toxic encephalopathy: Secondary | ICD-10-CM | POA: Diagnosis not present

## 2021-09-15 DIAGNOSIS — N390 Urinary tract infection, site not specified: Secondary | ICD-10-CM | POA: Diagnosis not present

## 2021-09-15 DIAGNOSIS — J9601 Acute respiratory failure with hypoxia: Secondary | ICD-10-CM | POA: Diagnosis not present

## 2021-09-15 DIAGNOSIS — I69318 Other symptoms and signs involving cognitive functions following cerebral infarction: Secondary | ICD-10-CM | POA: Diagnosis not present

## 2021-09-18 DIAGNOSIS — G928 Other toxic encephalopathy: Secondary | ICD-10-CM | POA: Diagnosis not present

## 2021-09-18 DIAGNOSIS — N390 Urinary tract infection, site not specified: Secondary | ICD-10-CM | POA: Diagnosis not present

## 2021-09-18 DIAGNOSIS — I69318 Other symptoms and signs involving cognitive functions following cerebral infarction: Secondary | ICD-10-CM | POA: Diagnosis not present

## 2021-09-18 DIAGNOSIS — B9620 Unspecified Escherichia coli [E. coli] as the cause of diseases classified elsewhere: Secondary | ICD-10-CM | POA: Diagnosis not present

## 2021-09-18 DIAGNOSIS — J9601 Acute respiratory failure with hypoxia: Secondary | ICD-10-CM | POA: Diagnosis not present

## 2021-09-18 DIAGNOSIS — I69398 Other sequelae of cerebral infarction: Secondary | ICD-10-CM | POA: Diagnosis not present

## 2021-09-19 DIAGNOSIS — J9601 Acute respiratory failure with hypoxia: Secondary | ICD-10-CM | POA: Diagnosis not present

## 2021-09-19 DIAGNOSIS — B9620 Unspecified Escherichia coli [E. coli] as the cause of diseases classified elsewhere: Secondary | ICD-10-CM | POA: Diagnosis not present

## 2021-09-19 DIAGNOSIS — N390 Urinary tract infection, site not specified: Secondary | ICD-10-CM | POA: Diagnosis not present

## 2021-09-19 DIAGNOSIS — I69318 Other symptoms and signs involving cognitive functions following cerebral infarction: Secondary | ICD-10-CM | POA: Diagnosis not present

## 2021-09-19 DIAGNOSIS — G928 Other toxic encephalopathy: Secondary | ICD-10-CM | POA: Diagnosis not present

## 2021-09-19 DIAGNOSIS — I69398 Other sequelae of cerebral infarction: Secondary | ICD-10-CM | POA: Diagnosis not present

## 2021-09-20 DIAGNOSIS — N390 Urinary tract infection, site not specified: Secondary | ICD-10-CM | POA: Diagnosis not present

## 2021-09-20 DIAGNOSIS — B9620 Unspecified Escherichia coli [E. coli] as the cause of diseases classified elsewhere: Secondary | ICD-10-CM | POA: Diagnosis not present

## 2021-09-20 DIAGNOSIS — G928 Other toxic encephalopathy: Secondary | ICD-10-CM | POA: Diagnosis not present

## 2021-09-20 DIAGNOSIS — I69398 Other sequelae of cerebral infarction: Secondary | ICD-10-CM | POA: Diagnosis not present

## 2021-09-20 DIAGNOSIS — I69318 Other symptoms and signs involving cognitive functions following cerebral infarction: Secondary | ICD-10-CM | POA: Diagnosis not present

## 2021-09-20 DIAGNOSIS — J9601 Acute respiratory failure with hypoxia: Secondary | ICD-10-CM | POA: Diagnosis not present

## 2021-09-21 DIAGNOSIS — J9601 Acute respiratory failure with hypoxia: Secondary | ICD-10-CM | POA: Diagnosis not present

## 2021-09-21 DIAGNOSIS — G928 Other toxic encephalopathy: Secondary | ICD-10-CM | POA: Diagnosis not present

## 2021-09-21 DIAGNOSIS — F32A Depression, unspecified: Secondary | ICD-10-CM | POA: Diagnosis not present

## 2021-09-21 DIAGNOSIS — N390 Urinary tract infection, site not specified: Secondary | ICD-10-CM | POA: Diagnosis not present

## 2021-09-21 DIAGNOSIS — I69398 Other sequelae of cerebral infarction: Secondary | ICD-10-CM | POA: Diagnosis not present

## 2021-09-21 DIAGNOSIS — E039 Hypothyroidism, unspecified: Secondary | ICD-10-CM | POA: Diagnosis not present

## 2021-09-21 DIAGNOSIS — I69318 Other symptoms and signs involving cognitive functions following cerebral infarction: Secondary | ICD-10-CM | POA: Diagnosis not present

## 2021-09-21 DIAGNOSIS — R42 Dizziness and giddiness: Secondary | ICD-10-CM | POA: Diagnosis not present

## 2021-09-21 DIAGNOSIS — B9620 Unspecified Escherichia coli [E. coli] as the cause of diseases classified elsewhere: Secondary | ICD-10-CM | POA: Diagnosis not present

## 2021-09-21 DIAGNOSIS — C50919 Malignant neoplasm of unspecified site of unspecified female breast: Secondary | ICD-10-CM | POA: Diagnosis not present

## 2021-09-21 DIAGNOSIS — G459 Transient cerebral ischemic attack, unspecified: Secondary | ICD-10-CM | POA: Diagnosis not present

## 2021-09-21 DIAGNOSIS — E119 Type 2 diabetes mellitus without complications: Secondary | ICD-10-CM | POA: Diagnosis not present

## 2021-09-21 DIAGNOSIS — R627 Adult failure to thrive: Secondary | ICD-10-CM | POA: Diagnosis not present

## 2021-09-21 DIAGNOSIS — D649 Anemia, unspecified: Secondary | ICD-10-CM | POA: Diagnosis not present

## 2021-09-21 DIAGNOSIS — E559 Vitamin D deficiency, unspecified: Secondary | ICD-10-CM | POA: Diagnosis not present

## 2021-09-25 DIAGNOSIS — I69398 Other sequelae of cerebral infarction: Secondary | ICD-10-CM | POA: Diagnosis not present

## 2021-09-25 DIAGNOSIS — M25521 Pain in right elbow: Secondary | ICD-10-CM | POA: Diagnosis not present

## 2021-09-25 DIAGNOSIS — I69318 Other symptoms and signs involving cognitive functions following cerebral infarction: Secondary | ICD-10-CM | POA: Diagnosis not present

## 2021-09-25 DIAGNOSIS — M79671 Pain in right foot: Secondary | ICD-10-CM | POA: Diagnosis not present

## 2021-09-25 DIAGNOSIS — B9620 Unspecified Escherichia coli [E. coli] as the cause of diseases classified elsewhere: Secondary | ICD-10-CM | POA: Diagnosis not present

## 2021-09-25 DIAGNOSIS — N390 Urinary tract infection, site not specified: Secondary | ICD-10-CM | POA: Diagnosis not present

## 2021-09-25 DIAGNOSIS — J9601 Acute respiratory failure with hypoxia: Secondary | ICD-10-CM | POA: Diagnosis not present

## 2021-09-25 DIAGNOSIS — G928 Other toxic encephalopathy: Secondary | ICD-10-CM | POA: Diagnosis not present

## 2021-09-26 DIAGNOSIS — B9620 Unspecified Escherichia coli [E. coli] as the cause of diseases classified elsewhere: Secondary | ICD-10-CM | POA: Diagnosis not present

## 2021-09-26 DIAGNOSIS — G928 Other toxic encephalopathy: Secondary | ICD-10-CM | POA: Diagnosis not present

## 2021-09-26 DIAGNOSIS — I69318 Other symptoms and signs involving cognitive functions following cerebral infarction: Secondary | ICD-10-CM | POA: Diagnosis not present

## 2021-09-26 DIAGNOSIS — J9601 Acute respiratory failure with hypoxia: Secondary | ICD-10-CM | POA: Diagnosis not present

## 2021-09-26 DIAGNOSIS — N390 Urinary tract infection, site not specified: Secondary | ICD-10-CM | POA: Diagnosis not present

## 2021-09-26 DIAGNOSIS — I69398 Other sequelae of cerebral infarction: Secondary | ICD-10-CM | POA: Diagnosis not present

## 2021-09-27 DIAGNOSIS — N390 Urinary tract infection, site not specified: Secondary | ICD-10-CM | POA: Diagnosis not present

## 2021-09-27 DIAGNOSIS — G928 Other toxic encephalopathy: Secondary | ICD-10-CM | POA: Diagnosis not present

## 2021-09-27 DIAGNOSIS — J9601 Acute respiratory failure with hypoxia: Secondary | ICD-10-CM | POA: Diagnosis not present

## 2021-09-27 DIAGNOSIS — I69318 Other symptoms and signs involving cognitive functions following cerebral infarction: Secondary | ICD-10-CM | POA: Diagnosis not present

## 2021-09-27 DIAGNOSIS — I69398 Other sequelae of cerebral infarction: Secondary | ICD-10-CM | POA: Diagnosis not present

## 2021-09-27 DIAGNOSIS — B9620 Unspecified Escherichia coli [E. coli] as the cause of diseases classified elsewhere: Secondary | ICD-10-CM | POA: Diagnosis not present

## 2021-09-28 DIAGNOSIS — I69398 Other sequelae of cerebral infarction: Secondary | ICD-10-CM | POA: Diagnosis not present

## 2021-09-28 DIAGNOSIS — J9601 Acute respiratory failure with hypoxia: Secondary | ICD-10-CM | POA: Diagnosis not present

## 2021-09-28 DIAGNOSIS — N390 Urinary tract infection, site not specified: Secondary | ICD-10-CM | POA: Diagnosis not present

## 2021-09-28 DIAGNOSIS — G928 Other toxic encephalopathy: Secondary | ICD-10-CM | POA: Diagnosis not present

## 2021-09-28 DIAGNOSIS — I69318 Other symptoms and signs involving cognitive functions following cerebral infarction: Secondary | ICD-10-CM | POA: Diagnosis not present

## 2021-09-28 DIAGNOSIS — B9620 Unspecified Escherichia coli [E. coli] as the cause of diseases classified elsewhere: Secondary | ICD-10-CM | POA: Diagnosis not present

## 2021-09-30 DIAGNOSIS — G928 Other toxic encephalopathy: Secondary | ICD-10-CM | POA: Diagnosis not present

## 2021-09-30 DIAGNOSIS — N390 Urinary tract infection, site not specified: Secondary | ICD-10-CM | POA: Diagnosis not present

## 2021-09-30 DIAGNOSIS — J9601 Acute respiratory failure with hypoxia: Secondary | ICD-10-CM | POA: Diagnosis not present

## 2021-09-30 DIAGNOSIS — I69398 Other sequelae of cerebral infarction: Secondary | ICD-10-CM | POA: Diagnosis not present

## 2021-09-30 DIAGNOSIS — B9620 Unspecified Escherichia coli [E. coli] as the cause of diseases classified elsewhere: Secondary | ICD-10-CM | POA: Diagnosis not present

## 2021-09-30 DIAGNOSIS — I69318 Other symptoms and signs involving cognitive functions following cerebral infarction: Secondary | ICD-10-CM | POA: Diagnosis not present

## 2021-10-02 DIAGNOSIS — B9620 Unspecified Escherichia coli [E. coli] as the cause of diseases classified elsewhere: Secondary | ICD-10-CM | POA: Diagnosis not present

## 2021-10-02 DIAGNOSIS — N390 Urinary tract infection, site not specified: Secondary | ICD-10-CM | POA: Diagnosis not present

## 2021-10-02 DIAGNOSIS — G928 Other toxic encephalopathy: Secondary | ICD-10-CM | POA: Diagnosis not present

## 2021-10-02 DIAGNOSIS — I69398 Other sequelae of cerebral infarction: Secondary | ICD-10-CM | POA: Diagnosis not present

## 2021-10-02 DIAGNOSIS — I69318 Other symptoms and signs involving cognitive functions following cerebral infarction: Secondary | ICD-10-CM | POA: Diagnosis not present

## 2021-10-02 DIAGNOSIS — J9601 Acute respiratory failure with hypoxia: Secondary | ICD-10-CM | POA: Diagnosis not present

## 2021-10-03 DIAGNOSIS — J9601 Acute respiratory failure with hypoxia: Secondary | ICD-10-CM | POA: Diagnosis not present

## 2021-10-03 DIAGNOSIS — U071 COVID-19: Secondary | ICD-10-CM | POA: Diagnosis not present

## 2021-10-03 DIAGNOSIS — N390 Urinary tract infection, site not specified: Secondary | ICD-10-CM | POA: Diagnosis not present

## 2021-10-03 DIAGNOSIS — I69318 Other symptoms and signs involving cognitive functions following cerebral infarction: Secondary | ICD-10-CM | POA: Diagnosis not present

## 2021-10-03 DIAGNOSIS — I69398 Other sequelae of cerebral infarction: Secondary | ICD-10-CM | POA: Diagnosis not present

## 2021-10-03 DIAGNOSIS — B9620 Unspecified Escherichia coli [E. coli] as the cause of diseases classified elsewhere: Secondary | ICD-10-CM | POA: Diagnosis not present

## 2021-10-03 DIAGNOSIS — Z20828 Contact with and (suspected) exposure to other viral communicable diseases: Secondary | ICD-10-CM | POA: Diagnosis not present

## 2021-10-03 DIAGNOSIS — G928 Other toxic encephalopathy: Secondary | ICD-10-CM | POA: Diagnosis not present

## 2021-10-04 DIAGNOSIS — N390 Urinary tract infection, site not specified: Secondary | ICD-10-CM | POA: Diagnosis not present

## 2021-10-04 DIAGNOSIS — J9601 Acute respiratory failure with hypoxia: Secondary | ICD-10-CM | POA: Diagnosis not present

## 2021-10-04 DIAGNOSIS — B9620 Unspecified Escherichia coli [E. coli] as the cause of diseases classified elsewhere: Secondary | ICD-10-CM | POA: Diagnosis not present

## 2021-10-04 DIAGNOSIS — I69318 Other symptoms and signs involving cognitive functions following cerebral infarction: Secondary | ICD-10-CM | POA: Diagnosis not present

## 2021-10-04 DIAGNOSIS — I69398 Other sequelae of cerebral infarction: Secondary | ICD-10-CM | POA: Diagnosis not present

## 2021-10-04 DIAGNOSIS — G928 Other toxic encephalopathy: Secondary | ICD-10-CM | POA: Diagnosis not present

## 2021-10-05 DIAGNOSIS — I69318 Other symptoms and signs involving cognitive functions following cerebral infarction: Secondary | ICD-10-CM | POA: Diagnosis not present

## 2021-10-05 DIAGNOSIS — N390 Urinary tract infection, site not specified: Secondary | ICD-10-CM | POA: Diagnosis not present

## 2021-10-05 DIAGNOSIS — J9601 Acute respiratory failure with hypoxia: Secondary | ICD-10-CM | POA: Diagnosis not present

## 2021-10-05 DIAGNOSIS — I69398 Other sequelae of cerebral infarction: Secondary | ICD-10-CM | POA: Diagnosis not present

## 2021-10-05 DIAGNOSIS — G928 Other toxic encephalopathy: Secondary | ICD-10-CM | POA: Diagnosis not present

## 2021-10-05 DIAGNOSIS — B9620 Unspecified Escherichia coli [E. coli] as the cause of diseases classified elsewhere: Secondary | ICD-10-CM | POA: Diagnosis not present

## 2021-10-08 DIAGNOSIS — I10 Essential (primary) hypertension: Secondary | ICD-10-CM | POA: Diagnosis not present

## 2021-10-08 DIAGNOSIS — E119 Type 2 diabetes mellitus without complications: Secondary | ICD-10-CM | POA: Diagnosis not present

## 2021-10-08 DIAGNOSIS — D518 Other vitamin B12 deficiency anemias: Secondary | ICD-10-CM | POA: Diagnosis not present

## 2021-10-08 DIAGNOSIS — E039 Hypothyroidism, unspecified: Secondary | ICD-10-CM | POA: Diagnosis not present

## 2021-10-08 DIAGNOSIS — E782 Mixed hyperlipidemia: Secondary | ICD-10-CM | POA: Diagnosis not present

## 2021-10-08 DIAGNOSIS — D649 Anemia, unspecified: Secondary | ICD-10-CM | POA: Diagnosis not present

## 2021-10-08 DIAGNOSIS — G459 Transient cerebral ischemic attack, unspecified: Secondary | ICD-10-CM | POA: Diagnosis not present

## 2021-10-08 DIAGNOSIS — C50919 Malignant neoplasm of unspecified site of unspecified female breast: Secondary | ICD-10-CM | POA: Diagnosis not present

## 2021-10-08 DIAGNOSIS — E038 Other specified hypothyroidism: Secondary | ICD-10-CM | POA: Diagnosis not present

## 2021-10-08 DIAGNOSIS — E559 Vitamin D deficiency, unspecified: Secondary | ICD-10-CM | POA: Diagnosis not present

## 2021-10-09 DIAGNOSIS — I69318 Other symptoms and signs involving cognitive functions following cerebral infarction: Secondary | ICD-10-CM | POA: Diagnosis not present

## 2021-10-09 DIAGNOSIS — G928 Other toxic encephalopathy: Secondary | ICD-10-CM | POA: Diagnosis not present

## 2021-10-09 DIAGNOSIS — B9620 Unspecified Escherichia coli [E. coli] as the cause of diseases classified elsewhere: Secondary | ICD-10-CM | POA: Diagnosis not present

## 2021-10-09 DIAGNOSIS — N390 Urinary tract infection, site not specified: Secondary | ICD-10-CM | POA: Diagnosis not present

## 2021-10-09 DIAGNOSIS — I69398 Other sequelae of cerebral infarction: Secondary | ICD-10-CM | POA: Diagnosis not present

## 2021-10-09 DIAGNOSIS — J9601 Acute respiratory failure with hypoxia: Secondary | ICD-10-CM | POA: Diagnosis not present

## 2021-10-10 DIAGNOSIS — B9620 Unspecified Escherichia coli [E. coli] as the cause of diseases classified elsewhere: Secondary | ICD-10-CM | POA: Diagnosis not present

## 2021-10-10 DIAGNOSIS — N390 Urinary tract infection, site not specified: Secondary | ICD-10-CM | POA: Diagnosis not present

## 2021-10-10 DIAGNOSIS — I69318 Other symptoms and signs involving cognitive functions following cerebral infarction: Secondary | ICD-10-CM | POA: Diagnosis not present

## 2021-10-10 DIAGNOSIS — I10 Essential (primary) hypertension: Secondary | ICD-10-CM | POA: Diagnosis not present

## 2021-10-10 DIAGNOSIS — J9601 Acute respiratory failure with hypoxia: Secondary | ICD-10-CM | POA: Diagnosis not present

## 2021-10-10 DIAGNOSIS — G928 Other toxic encephalopathy: Secondary | ICD-10-CM | POA: Diagnosis not present

## 2021-10-10 DIAGNOSIS — I69398 Other sequelae of cerebral infarction: Secondary | ICD-10-CM | POA: Diagnosis not present

## 2021-10-11 DIAGNOSIS — E119 Type 2 diabetes mellitus without complications: Secondary | ICD-10-CM | POA: Diagnosis not present

## 2021-10-11 DIAGNOSIS — G40909 Epilepsy, unspecified, not intractable, without status epilepticus: Secondary | ICD-10-CM | POA: Diagnosis not present

## 2021-10-11 DIAGNOSIS — D709 Neutropenia, unspecified: Secondary | ICD-10-CM | POA: Diagnosis not present

## 2021-10-11 DIAGNOSIS — R42 Dizziness and giddiness: Secondary | ICD-10-CM | POA: Diagnosis not present

## 2021-10-11 DIAGNOSIS — E785 Hyperlipidemia, unspecified: Secondary | ICD-10-CM | POA: Diagnosis not present

## 2021-10-11 DIAGNOSIS — I10 Essential (primary) hypertension: Secondary | ICD-10-CM | POA: Diagnosis not present

## 2021-10-11 DIAGNOSIS — D649 Anemia, unspecified: Secondary | ICD-10-CM | POA: Diagnosis not present

## 2021-10-11 DIAGNOSIS — I69398 Other sequelae of cerebral infarction: Secondary | ICD-10-CM | POA: Diagnosis not present

## 2021-10-11 DIAGNOSIS — C50912 Malignant neoplasm of unspecified site of left female breast: Secondary | ICD-10-CM | POA: Diagnosis not present

## 2021-10-11 DIAGNOSIS — B9620 Unspecified Escherichia coli [E. coli] as the cause of diseases classified elsewhere: Secondary | ICD-10-CM | POA: Diagnosis not present

## 2021-10-11 DIAGNOSIS — J9601 Acute respiratory failure with hypoxia: Secondary | ICD-10-CM | POA: Diagnosis not present

## 2021-10-11 DIAGNOSIS — I69318 Other symptoms and signs involving cognitive functions following cerebral infarction: Secondary | ICD-10-CM | POA: Diagnosis not present

## 2021-10-11 DIAGNOSIS — N390 Urinary tract infection, site not specified: Secondary | ICD-10-CM | POA: Diagnosis not present

## 2021-10-11 DIAGNOSIS — G928 Other toxic encephalopathy: Secondary | ICD-10-CM | POA: Diagnosis not present

## 2021-10-11 DIAGNOSIS — Z8616 Personal history of COVID-19: Secondary | ICD-10-CM | POA: Diagnosis not present

## 2021-10-11 DIAGNOSIS — E43 Unspecified severe protein-calorie malnutrition: Secondary | ICD-10-CM | POA: Diagnosis not present

## 2021-10-11 DIAGNOSIS — F32A Depression, unspecified: Secondary | ICD-10-CM | POA: Diagnosis not present

## 2021-10-11 DIAGNOSIS — E039 Hypothyroidism, unspecified: Secondary | ICD-10-CM | POA: Diagnosis not present

## 2021-10-11 DIAGNOSIS — H1132 Conjunctival hemorrhage, left eye: Secondary | ICD-10-CM | POA: Diagnosis not present

## 2021-10-11 DIAGNOSIS — M5000 Cervical disc disorder with myelopathy, unspecified cervical region: Secondary | ICD-10-CM | POA: Diagnosis not present

## 2021-10-12 DIAGNOSIS — G928 Other toxic encephalopathy: Secondary | ICD-10-CM | POA: Diagnosis not present

## 2021-10-12 DIAGNOSIS — I69398 Other sequelae of cerebral infarction: Secondary | ICD-10-CM | POA: Diagnosis not present

## 2021-10-12 DIAGNOSIS — B9620 Unspecified Escherichia coli [E. coli] as the cause of diseases classified elsewhere: Secondary | ICD-10-CM | POA: Diagnosis not present

## 2021-10-12 DIAGNOSIS — N390 Urinary tract infection, site not specified: Secondary | ICD-10-CM | POA: Diagnosis not present

## 2021-10-12 DIAGNOSIS — I69318 Other symptoms and signs involving cognitive functions following cerebral infarction: Secondary | ICD-10-CM | POA: Diagnosis not present

## 2021-10-12 DIAGNOSIS — J9601 Acute respiratory failure with hypoxia: Secondary | ICD-10-CM | POA: Diagnosis not present

## 2021-10-16 DIAGNOSIS — I69398 Other sequelae of cerebral infarction: Secondary | ICD-10-CM | POA: Diagnosis not present

## 2021-10-16 DIAGNOSIS — N390 Urinary tract infection, site not specified: Secondary | ICD-10-CM | POA: Diagnosis not present

## 2021-10-16 DIAGNOSIS — G928 Other toxic encephalopathy: Secondary | ICD-10-CM | POA: Diagnosis not present

## 2021-10-16 DIAGNOSIS — J9601 Acute respiratory failure with hypoxia: Secondary | ICD-10-CM | POA: Diagnosis not present

## 2021-10-16 DIAGNOSIS — B9620 Unspecified Escherichia coli [E. coli] as the cause of diseases classified elsewhere: Secondary | ICD-10-CM | POA: Diagnosis not present

## 2021-10-16 DIAGNOSIS — I69318 Other symptoms and signs involving cognitive functions following cerebral infarction: Secondary | ICD-10-CM | POA: Diagnosis not present

## 2021-10-17 DIAGNOSIS — J9601 Acute respiratory failure with hypoxia: Secondary | ICD-10-CM | POA: Diagnosis not present

## 2021-10-17 DIAGNOSIS — N390 Urinary tract infection, site not specified: Secondary | ICD-10-CM | POA: Diagnosis not present

## 2021-10-17 DIAGNOSIS — I69398 Other sequelae of cerebral infarction: Secondary | ICD-10-CM | POA: Diagnosis not present

## 2021-10-17 DIAGNOSIS — I69318 Other symptoms and signs involving cognitive functions following cerebral infarction: Secondary | ICD-10-CM | POA: Diagnosis not present

## 2021-10-17 DIAGNOSIS — B9620 Unspecified Escherichia coli [E. coli] as the cause of diseases classified elsewhere: Secondary | ICD-10-CM | POA: Diagnosis not present

## 2021-10-17 DIAGNOSIS — G928 Other toxic encephalopathy: Secondary | ICD-10-CM | POA: Diagnosis not present

## 2021-10-18 DIAGNOSIS — G928 Other toxic encephalopathy: Secondary | ICD-10-CM | POA: Diagnosis not present

## 2021-10-18 DIAGNOSIS — I69318 Other symptoms and signs involving cognitive functions following cerebral infarction: Secondary | ICD-10-CM | POA: Diagnosis not present

## 2021-10-18 DIAGNOSIS — N390 Urinary tract infection, site not specified: Secondary | ICD-10-CM | POA: Diagnosis not present

## 2021-10-18 DIAGNOSIS — B9620 Unspecified Escherichia coli [E. coli] as the cause of diseases classified elsewhere: Secondary | ICD-10-CM | POA: Diagnosis not present

## 2021-10-18 DIAGNOSIS — J9601 Acute respiratory failure with hypoxia: Secondary | ICD-10-CM | POA: Diagnosis not present

## 2021-10-18 DIAGNOSIS — I69398 Other sequelae of cerebral infarction: Secondary | ICD-10-CM | POA: Diagnosis not present

## 2021-10-19 DIAGNOSIS — J9601 Acute respiratory failure with hypoxia: Secondary | ICD-10-CM | POA: Diagnosis not present

## 2021-10-19 DIAGNOSIS — B9620 Unspecified Escherichia coli [E. coli] as the cause of diseases classified elsewhere: Secondary | ICD-10-CM | POA: Diagnosis not present

## 2021-10-19 DIAGNOSIS — I69398 Other sequelae of cerebral infarction: Secondary | ICD-10-CM | POA: Diagnosis not present

## 2021-10-19 DIAGNOSIS — N390 Urinary tract infection, site not specified: Secondary | ICD-10-CM | POA: Diagnosis not present

## 2021-10-19 DIAGNOSIS — I69318 Other symptoms and signs involving cognitive functions following cerebral infarction: Secondary | ICD-10-CM | POA: Diagnosis not present

## 2021-10-19 DIAGNOSIS — G928 Other toxic encephalopathy: Secondary | ICD-10-CM | POA: Diagnosis not present

## 2021-10-20 DIAGNOSIS — E038 Other specified hypothyroidism: Secondary | ICD-10-CM | POA: Diagnosis not present

## 2021-10-20 DIAGNOSIS — E559 Vitamin D deficiency, unspecified: Secondary | ICD-10-CM | POA: Diagnosis not present

## 2021-10-20 DIAGNOSIS — E119 Type 2 diabetes mellitus without complications: Secondary | ICD-10-CM | POA: Diagnosis not present

## 2021-10-20 DIAGNOSIS — D518 Other vitamin B12 deficiency anemias: Secondary | ICD-10-CM | POA: Diagnosis not present

## 2021-10-20 DIAGNOSIS — Z79899 Other long term (current) drug therapy: Secondary | ICD-10-CM | POA: Diagnosis not present

## 2021-10-20 DIAGNOSIS — E7849 Other hyperlipidemia: Secondary | ICD-10-CM | POA: Diagnosis not present

## 2021-10-23 DIAGNOSIS — I69318 Other symptoms and signs involving cognitive functions following cerebral infarction: Secondary | ICD-10-CM | POA: Diagnosis not present

## 2021-10-23 DIAGNOSIS — R627 Adult failure to thrive: Secondary | ICD-10-CM | POA: Diagnosis not present

## 2021-10-23 DIAGNOSIS — I447 Left bundle-branch block, unspecified: Secondary | ICD-10-CM | POA: Diagnosis not present

## 2021-10-23 DIAGNOSIS — C50919 Malignant neoplasm of unspecified site of unspecified female breast: Secondary | ICD-10-CM | POA: Diagnosis not present

## 2021-10-23 DIAGNOSIS — D649 Anemia, unspecified: Secondary | ICD-10-CM | POA: Diagnosis not present

## 2021-10-23 DIAGNOSIS — B9620 Unspecified Escherichia coli [E. coli] as the cause of diseases classified elsewhere: Secondary | ICD-10-CM | POA: Diagnosis not present

## 2021-10-23 DIAGNOSIS — E039 Hypothyroidism, unspecified: Secondary | ICD-10-CM | POA: Diagnosis not present

## 2021-10-23 DIAGNOSIS — I69398 Other sequelae of cerebral infarction: Secondary | ICD-10-CM | POA: Diagnosis not present

## 2021-10-23 DIAGNOSIS — E119 Type 2 diabetes mellitus without complications: Secondary | ICD-10-CM | POA: Diagnosis not present

## 2021-10-23 DIAGNOSIS — N390 Urinary tract infection, site not specified: Secondary | ICD-10-CM | POA: Diagnosis not present

## 2021-10-23 DIAGNOSIS — J9601 Acute respiratory failure with hypoxia: Secondary | ICD-10-CM | POA: Diagnosis not present

## 2021-10-23 DIAGNOSIS — R42 Dizziness and giddiness: Secondary | ICD-10-CM | POA: Diagnosis not present

## 2021-10-23 DIAGNOSIS — G928 Other toxic encephalopathy: Secondary | ICD-10-CM | POA: Diagnosis not present

## 2021-10-23 DIAGNOSIS — I10 Essential (primary) hypertension: Secondary | ICD-10-CM | POA: Diagnosis not present

## 2021-10-23 DIAGNOSIS — G459 Transient cerebral ischemic attack, unspecified: Secondary | ICD-10-CM | POA: Diagnosis not present

## 2021-10-23 IMAGING — CT CT HEAD CODE STROKE
3 of 4 series · 14 of 47 positions shown, 16 images · non-contrast
Comparison: Prior CT and MRI from 05/19/2021.

CLINICAL DATA: Code stroke.  Follow-up examination for stroke.

EXAM:
CT HEAD WITHOUT CONTRAST
TECHNIQUE: Contiguous axial images were obtained from the base of the skull
through the vertex without intravenous contrast.

[Series 2: head 5.0 st · axial · 0.44mm/px · z∈[-163,-28]mm · 8 of 33 slices shown, 10 images]
[im 3/33  brain]
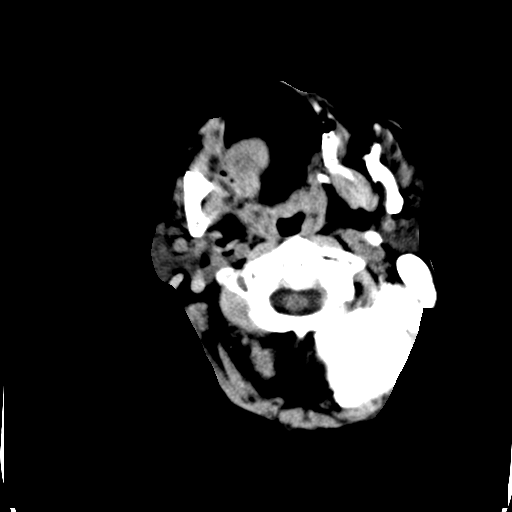
[im 3/33  bone]
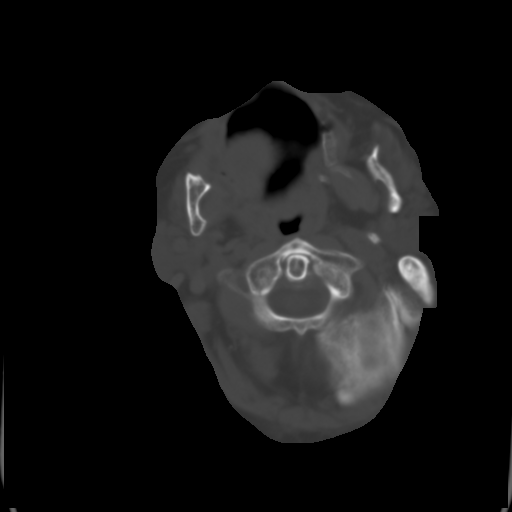
[im 7/33  brain]
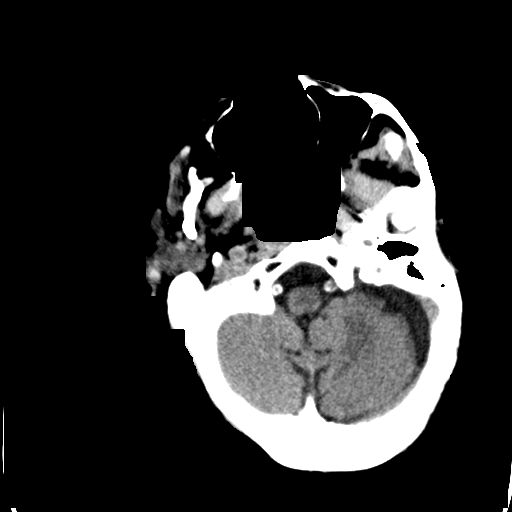
[im 11/33  brain]
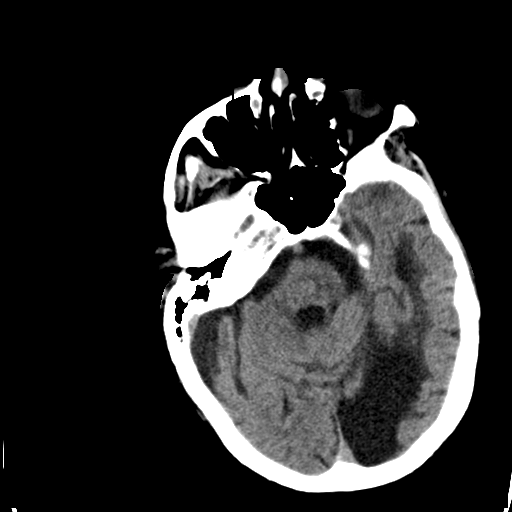
[im 15/33  brain]
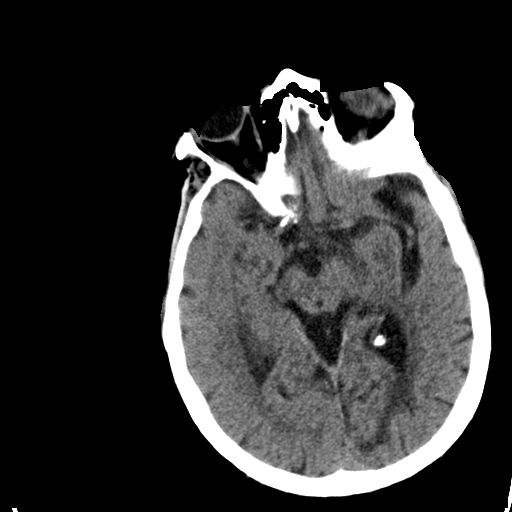
[im 18/33  brain]
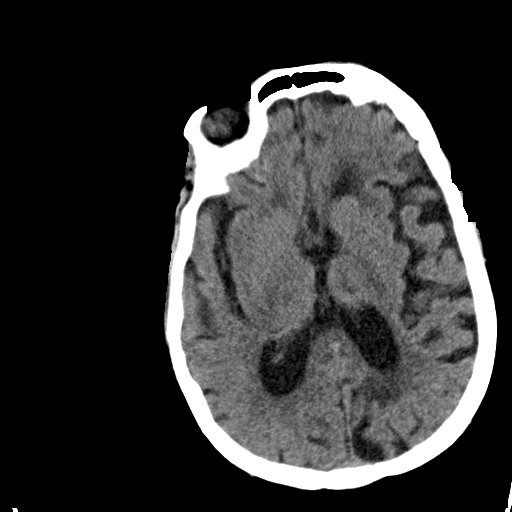
[im 18/33  bone]
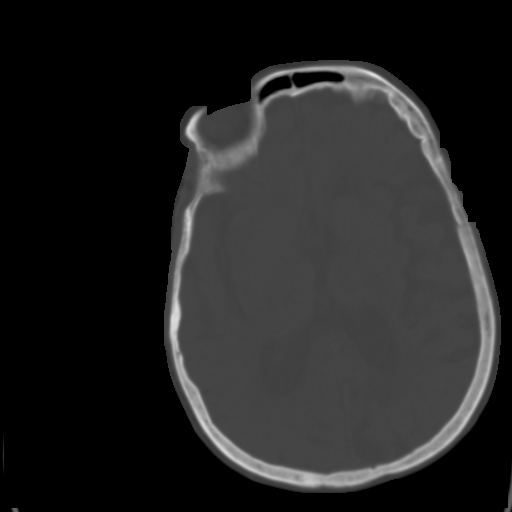
[im 22/33  brain]
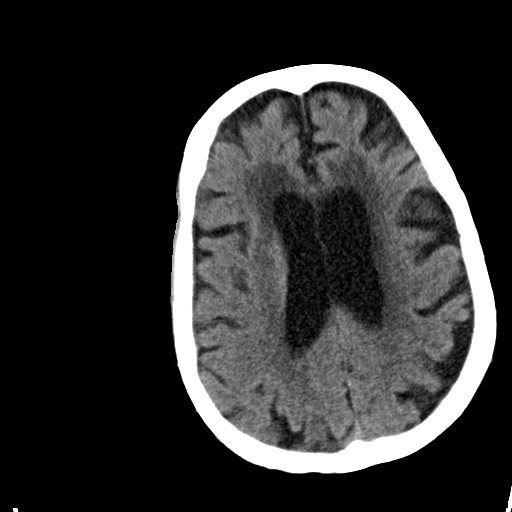
[im 26/33  brain]
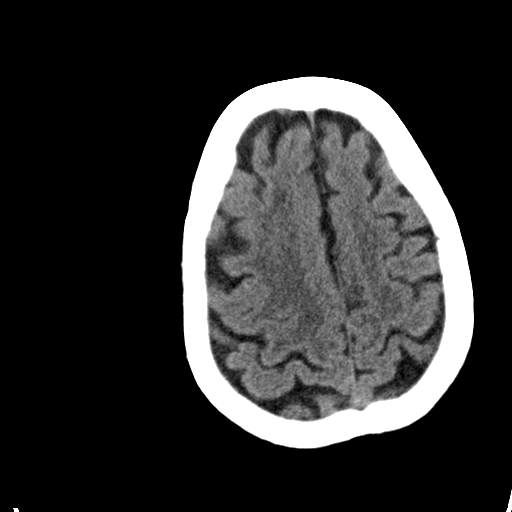
[im 30/33  brain]
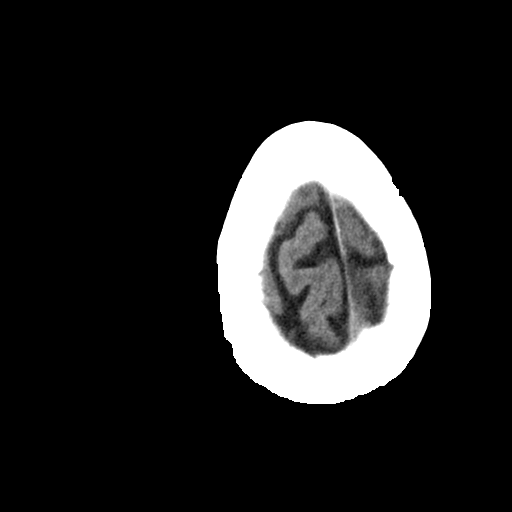

[Series 5: head 3.0 cor st · coronal · 0.32mm/px · 3 of 73 slices shown]
[im 27/73  brain]
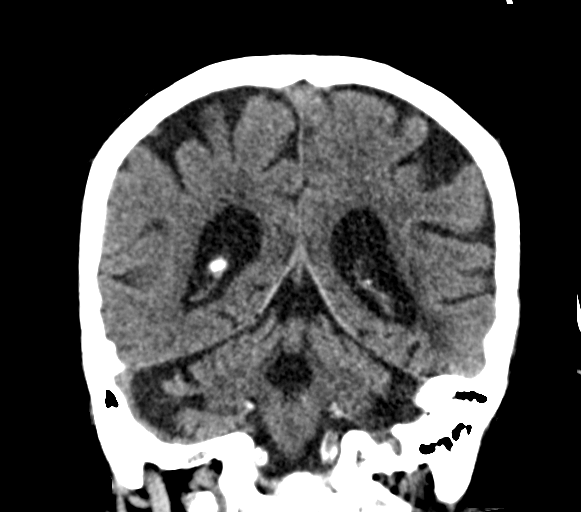
[im 33/73  brain]
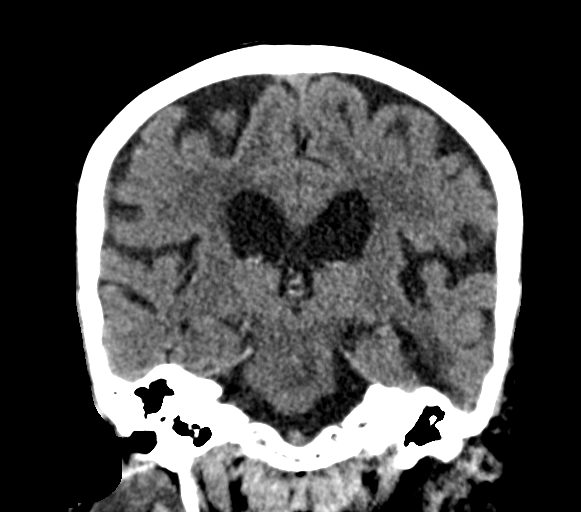
[im 40/73  brain]
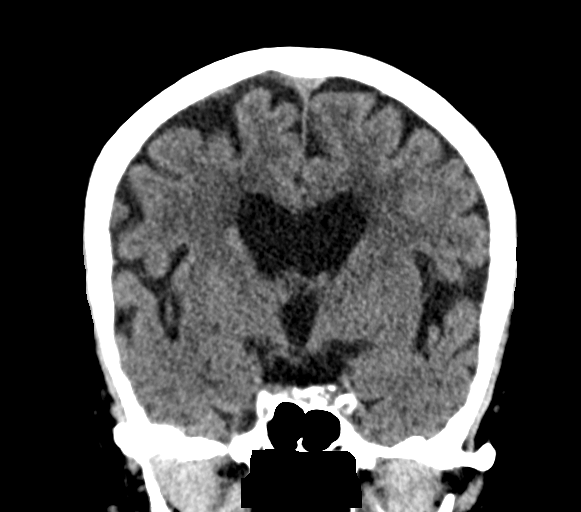

[Series 6: head 3.0 sag st · sagittal · 0.27mm/px · 3 of 67 slices shown]
[im 28/67  brain]
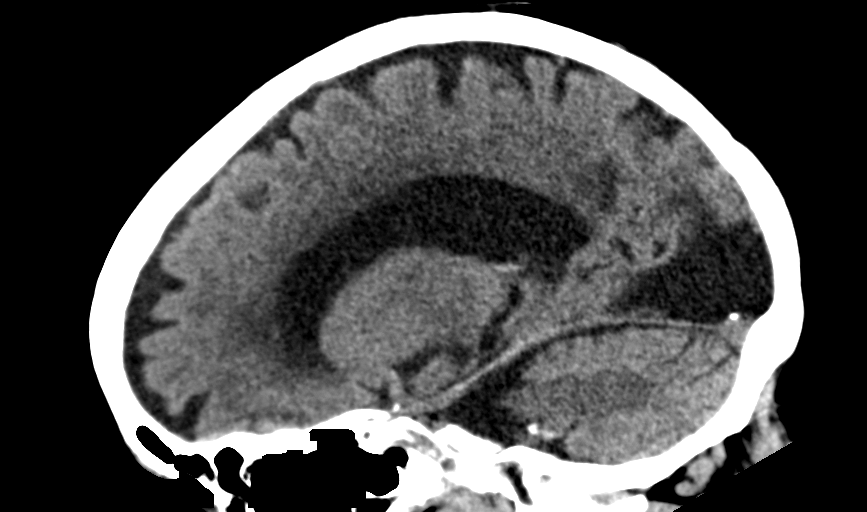
[im 34/67  brain]
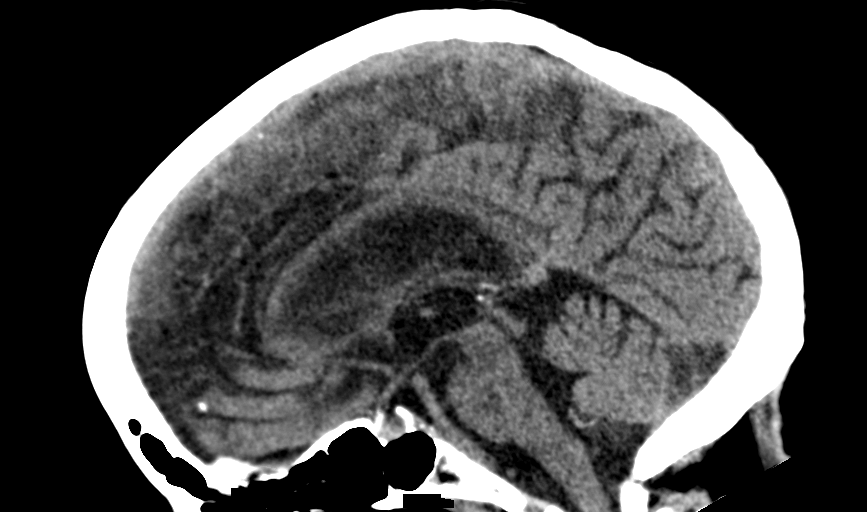
[im 39/67  brain]
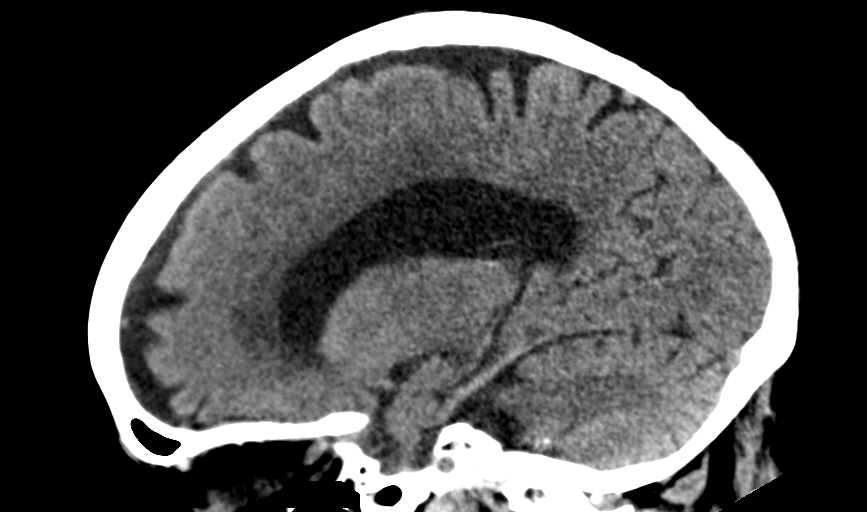

[14 of 47 positions shown; findings below may reference images not displayed]

FINDINGS: Brain: Age-related cerebral atrophy with moderate chronic
microvascular ischemic disease. Chronic left PCA distribution
infarct again noted. Previously identified punctate left-sided acute
infarct not visible by CT. No other acute large vessel territory
infarct. No acute intracranial hemorrhage. No mass lesion or midline
shift. No hydrocephalus or extra-axial fluid collection.

Vascular: No hyperdense vessel. Calcified atherosclerosis at the
skull base.

Skull: Scalp soft tissues and calvarium demonstrate no new finding.

Sinuses/Orbits: Globes and orbital soft tissues within normal
limits. Paranasal sinuses and mastoid air cells remain clear.

Other: None.

ASPECTS (Alberta Stroke Program Early CT Score)

- Ganglionic level infarction (caudate, lentiform nuclei, internal
capsule, insula, M1-M3 cortex): 7

- Supraganglionic infarction (M4-M6 cortex): 3

Total score (0-10 with 10 being normal): 10
IMPRESSION: 1. Stable CT. No new acute intracranial abnormality. Previously
identified punctate left-sided infarct not visible by CT.
2. ASPECTS is 10.
3. Atrophy with moderate chronic microvascular ischemic disease and
chronic left PCA distribution infarct.

These results were communicated to Dr. Orduno at [DATE] on 05/20/2021
by text page via the [employer] messaging system.

## 2021-10-24 DIAGNOSIS — I69318 Other symptoms and signs involving cognitive functions following cerebral infarction: Secondary | ICD-10-CM | POA: Diagnosis not present

## 2021-10-24 DIAGNOSIS — I69398 Other sequelae of cerebral infarction: Secondary | ICD-10-CM | POA: Diagnosis not present

## 2021-10-24 DIAGNOSIS — N390 Urinary tract infection, site not specified: Secondary | ICD-10-CM | POA: Diagnosis not present

## 2021-10-24 DIAGNOSIS — J9601 Acute respiratory failure with hypoxia: Secondary | ICD-10-CM | POA: Diagnosis not present

## 2021-10-24 DIAGNOSIS — G928 Other toxic encephalopathy: Secondary | ICD-10-CM | POA: Diagnosis not present

## 2021-10-24 DIAGNOSIS — B9620 Unspecified Escherichia coli [E. coli] as the cause of diseases classified elsewhere: Secondary | ICD-10-CM | POA: Diagnosis not present

## 2021-10-25 DIAGNOSIS — N390 Urinary tract infection, site not specified: Secondary | ICD-10-CM | POA: Diagnosis not present

## 2021-10-25 DIAGNOSIS — G928 Other toxic encephalopathy: Secondary | ICD-10-CM | POA: Diagnosis not present

## 2021-10-25 DIAGNOSIS — I69318 Other symptoms and signs involving cognitive functions following cerebral infarction: Secondary | ICD-10-CM | POA: Diagnosis not present

## 2021-10-25 DIAGNOSIS — B9620 Unspecified Escherichia coli [E. coli] as the cause of diseases classified elsewhere: Secondary | ICD-10-CM | POA: Diagnosis not present

## 2021-10-25 DIAGNOSIS — J9601 Acute respiratory failure with hypoxia: Secondary | ICD-10-CM | POA: Diagnosis not present

## 2021-10-25 DIAGNOSIS — I69398 Other sequelae of cerebral infarction: Secondary | ICD-10-CM | POA: Diagnosis not present

## 2021-10-26 DIAGNOSIS — I69398 Other sequelae of cerebral infarction: Secondary | ICD-10-CM | POA: Diagnosis not present

## 2021-10-26 DIAGNOSIS — N390 Urinary tract infection, site not specified: Secondary | ICD-10-CM | POA: Diagnosis not present

## 2021-10-26 DIAGNOSIS — G928 Other toxic encephalopathy: Secondary | ICD-10-CM | POA: Diagnosis not present

## 2021-10-26 DIAGNOSIS — J9601 Acute respiratory failure with hypoxia: Secondary | ICD-10-CM | POA: Diagnosis not present

## 2021-10-26 DIAGNOSIS — B9620 Unspecified Escherichia coli [E. coli] as the cause of diseases classified elsewhere: Secondary | ICD-10-CM | POA: Diagnosis not present

## 2021-10-26 DIAGNOSIS — I69318 Other symptoms and signs involving cognitive functions following cerebral infarction: Secondary | ICD-10-CM | POA: Diagnosis not present

## 2021-10-27 DIAGNOSIS — B9620 Unspecified Escherichia coli [E. coli] as the cause of diseases classified elsewhere: Secondary | ICD-10-CM | POA: Diagnosis not present

## 2021-10-27 DIAGNOSIS — N390 Urinary tract infection, site not specified: Secondary | ICD-10-CM | POA: Diagnosis not present

## 2021-10-27 DIAGNOSIS — I69318 Other symptoms and signs involving cognitive functions following cerebral infarction: Secondary | ICD-10-CM | POA: Diagnosis not present

## 2021-10-27 DIAGNOSIS — G928 Other toxic encephalopathy: Secondary | ICD-10-CM | POA: Diagnosis not present

## 2021-10-27 DIAGNOSIS — J9601 Acute respiratory failure with hypoxia: Secondary | ICD-10-CM | POA: Diagnosis not present

## 2021-10-27 DIAGNOSIS — I69398 Other sequelae of cerebral infarction: Secondary | ICD-10-CM | POA: Diagnosis not present

## 2021-10-30 DIAGNOSIS — I69318 Other symptoms and signs involving cognitive functions following cerebral infarction: Secondary | ICD-10-CM | POA: Diagnosis not present

## 2021-10-30 DIAGNOSIS — B9620 Unspecified Escherichia coli [E. coli] as the cause of diseases classified elsewhere: Secondary | ICD-10-CM | POA: Diagnosis not present

## 2021-10-30 DIAGNOSIS — G459 Transient cerebral ischemic attack, unspecified: Secondary | ICD-10-CM | POA: Diagnosis not present

## 2021-10-30 DIAGNOSIS — N390 Urinary tract infection, site not specified: Secondary | ICD-10-CM | POA: Diagnosis not present

## 2021-10-30 DIAGNOSIS — C50919 Malignant neoplasm of unspecified site of unspecified female breast: Secondary | ICD-10-CM | POA: Diagnosis not present

## 2021-10-30 DIAGNOSIS — E039 Hypothyroidism, unspecified: Secondary | ICD-10-CM | POA: Diagnosis not present

## 2021-10-30 DIAGNOSIS — G928 Other toxic encephalopathy: Secondary | ICD-10-CM | POA: Diagnosis not present

## 2021-10-30 DIAGNOSIS — I69398 Other sequelae of cerebral infarction: Secondary | ICD-10-CM | POA: Diagnosis not present

## 2021-10-30 DIAGNOSIS — D518 Other vitamin B12 deficiency anemias: Secondary | ICD-10-CM | POA: Diagnosis not present

## 2021-10-30 DIAGNOSIS — E119 Type 2 diabetes mellitus without complications: Secondary | ICD-10-CM | POA: Diagnosis not present

## 2021-10-30 DIAGNOSIS — I10 Essential (primary) hypertension: Secondary | ICD-10-CM | POA: Diagnosis not present

## 2021-10-30 DIAGNOSIS — E782 Mixed hyperlipidemia: Secondary | ICD-10-CM | POA: Diagnosis not present

## 2021-10-30 DIAGNOSIS — D649 Anemia, unspecified: Secondary | ICD-10-CM | POA: Diagnosis not present

## 2021-10-30 DIAGNOSIS — E559 Vitamin D deficiency, unspecified: Secondary | ICD-10-CM | POA: Diagnosis not present

## 2021-10-30 DIAGNOSIS — E038 Other specified hypothyroidism: Secondary | ICD-10-CM | POA: Diagnosis not present

## 2021-10-30 DIAGNOSIS — J9601 Acute respiratory failure with hypoxia: Secondary | ICD-10-CM | POA: Diagnosis not present

## 2021-10-31 DIAGNOSIS — I69318 Other symptoms and signs involving cognitive functions following cerebral infarction: Secondary | ICD-10-CM | POA: Diagnosis not present

## 2021-10-31 DIAGNOSIS — N390 Urinary tract infection, site not specified: Secondary | ICD-10-CM | POA: Diagnosis not present

## 2021-10-31 DIAGNOSIS — J9601 Acute respiratory failure with hypoxia: Secondary | ICD-10-CM | POA: Diagnosis not present

## 2021-10-31 DIAGNOSIS — B9620 Unspecified Escherichia coli [E. coli] as the cause of diseases classified elsewhere: Secondary | ICD-10-CM | POA: Diagnosis not present

## 2021-10-31 DIAGNOSIS — I69398 Other sequelae of cerebral infarction: Secondary | ICD-10-CM | POA: Diagnosis not present

## 2021-10-31 DIAGNOSIS — G928 Other toxic encephalopathy: Secondary | ICD-10-CM | POA: Diagnosis not present

## 2021-11-01 DIAGNOSIS — B9620 Unspecified Escherichia coli [E. coli] as the cause of diseases classified elsewhere: Secondary | ICD-10-CM | POA: Diagnosis not present

## 2021-11-01 DIAGNOSIS — G928 Other toxic encephalopathy: Secondary | ICD-10-CM | POA: Diagnosis not present

## 2021-11-01 DIAGNOSIS — J9601 Acute respiratory failure with hypoxia: Secondary | ICD-10-CM | POA: Diagnosis not present

## 2021-11-01 DIAGNOSIS — I69398 Other sequelae of cerebral infarction: Secondary | ICD-10-CM | POA: Diagnosis not present

## 2021-11-01 DIAGNOSIS — N390 Urinary tract infection, site not specified: Secondary | ICD-10-CM | POA: Diagnosis not present

## 2021-11-01 DIAGNOSIS — I69318 Other symptoms and signs involving cognitive functions following cerebral infarction: Secondary | ICD-10-CM | POA: Diagnosis not present

## 2021-11-02 DIAGNOSIS — G928 Other toxic encephalopathy: Secondary | ICD-10-CM | POA: Diagnosis not present

## 2021-11-02 DIAGNOSIS — J9601 Acute respiratory failure with hypoxia: Secondary | ICD-10-CM | POA: Diagnosis not present

## 2021-11-02 DIAGNOSIS — N390 Urinary tract infection, site not specified: Secondary | ICD-10-CM | POA: Diagnosis not present

## 2021-11-02 DIAGNOSIS — I69318 Other symptoms and signs involving cognitive functions following cerebral infarction: Secondary | ICD-10-CM | POA: Diagnosis not present

## 2021-11-02 DIAGNOSIS — B9620 Unspecified Escherichia coli [E. coli] as the cause of diseases classified elsewhere: Secondary | ICD-10-CM | POA: Diagnosis not present

## 2021-11-02 DIAGNOSIS — I69398 Other sequelae of cerebral infarction: Secondary | ICD-10-CM | POA: Diagnosis not present

## 2021-11-03 DIAGNOSIS — G928 Other toxic encephalopathy: Secondary | ICD-10-CM | POA: Diagnosis not present

## 2021-11-03 DIAGNOSIS — B9620 Unspecified Escherichia coli [E. coli] as the cause of diseases classified elsewhere: Secondary | ICD-10-CM | POA: Diagnosis not present

## 2021-11-03 DIAGNOSIS — N390 Urinary tract infection, site not specified: Secondary | ICD-10-CM | POA: Diagnosis not present

## 2021-11-03 DIAGNOSIS — I69398 Other sequelae of cerebral infarction: Secondary | ICD-10-CM | POA: Diagnosis not present

## 2021-11-03 DIAGNOSIS — J9601 Acute respiratory failure with hypoxia: Secondary | ICD-10-CM | POA: Diagnosis not present

## 2021-11-03 DIAGNOSIS — I69318 Other symptoms and signs involving cognitive functions following cerebral infarction: Secondary | ICD-10-CM | POA: Diagnosis not present

## 2021-11-06 DIAGNOSIS — I69398 Other sequelae of cerebral infarction: Secondary | ICD-10-CM | POA: Diagnosis not present

## 2021-11-06 DIAGNOSIS — N390 Urinary tract infection, site not specified: Secondary | ICD-10-CM | POA: Diagnosis not present

## 2021-11-06 DIAGNOSIS — G928 Other toxic encephalopathy: Secondary | ICD-10-CM | POA: Diagnosis not present

## 2021-11-06 DIAGNOSIS — I69318 Other symptoms and signs involving cognitive functions following cerebral infarction: Secondary | ICD-10-CM | POA: Diagnosis not present

## 2021-11-06 DIAGNOSIS — B9620 Unspecified Escherichia coli [E. coli] as the cause of diseases classified elsewhere: Secondary | ICD-10-CM | POA: Diagnosis not present

## 2021-11-06 DIAGNOSIS — J9601 Acute respiratory failure with hypoxia: Secondary | ICD-10-CM | POA: Diagnosis not present

## 2021-11-07 DIAGNOSIS — G928 Other toxic encephalopathy: Secondary | ICD-10-CM | POA: Diagnosis not present

## 2021-11-07 DIAGNOSIS — I69398 Other sequelae of cerebral infarction: Secondary | ICD-10-CM | POA: Diagnosis not present

## 2021-11-07 DIAGNOSIS — I10 Essential (primary) hypertension: Secondary | ICD-10-CM | POA: Diagnosis not present

## 2021-11-07 DIAGNOSIS — J9601 Acute respiratory failure with hypoxia: Secondary | ICD-10-CM | POA: Diagnosis not present

## 2021-11-07 DIAGNOSIS — B9620 Unspecified Escherichia coli [E. coli] as the cause of diseases classified elsewhere: Secondary | ICD-10-CM | POA: Diagnosis not present

## 2021-11-07 DIAGNOSIS — N390 Urinary tract infection, site not specified: Secondary | ICD-10-CM | POA: Diagnosis not present

## 2021-11-07 DIAGNOSIS — I69318 Other symptoms and signs involving cognitive functions following cerebral infarction: Secondary | ICD-10-CM | POA: Diagnosis not present

## 2021-11-08 DIAGNOSIS — N390 Urinary tract infection, site not specified: Secondary | ICD-10-CM | POA: Diagnosis not present

## 2021-11-08 DIAGNOSIS — I69318 Other symptoms and signs involving cognitive functions following cerebral infarction: Secondary | ICD-10-CM | POA: Diagnosis not present

## 2021-11-08 DIAGNOSIS — G928 Other toxic encephalopathy: Secondary | ICD-10-CM | POA: Diagnosis not present

## 2021-11-08 DIAGNOSIS — D709 Neutropenia, unspecified: Secondary | ICD-10-CM | POA: Diagnosis not present

## 2021-11-08 DIAGNOSIS — E119 Type 2 diabetes mellitus without complications: Secondary | ICD-10-CM | POA: Diagnosis not present

## 2021-11-08 DIAGNOSIS — F32A Depression, unspecified: Secondary | ICD-10-CM | POA: Diagnosis not present

## 2021-11-08 DIAGNOSIS — H1132 Conjunctival hemorrhage, left eye: Secondary | ICD-10-CM | POA: Diagnosis not present

## 2021-11-08 DIAGNOSIS — G40909 Epilepsy, unspecified, not intractable, without status epilepticus: Secondary | ICD-10-CM | POA: Diagnosis not present

## 2021-11-08 DIAGNOSIS — E785 Hyperlipidemia, unspecified: Secondary | ICD-10-CM | POA: Diagnosis not present

## 2021-11-08 DIAGNOSIS — I10 Essential (primary) hypertension: Secondary | ICD-10-CM | POA: Diagnosis not present

## 2021-11-08 DIAGNOSIS — J9601 Acute respiratory failure with hypoxia: Secondary | ICD-10-CM | POA: Diagnosis not present

## 2021-11-08 DIAGNOSIS — B9620 Unspecified Escherichia coli [E. coli] as the cause of diseases classified elsewhere: Secondary | ICD-10-CM | POA: Diagnosis not present

## 2021-11-08 DIAGNOSIS — M5000 Cervical disc disorder with myelopathy, unspecified cervical region: Secondary | ICD-10-CM | POA: Diagnosis not present

## 2021-11-08 DIAGNOSIS — C50912 Malignant neoplasm of unspecified site of left female breast: Secondary | ICD-10-CM | POA: Diagnosis not present

## 2021-11-08 DIAGNOSIS — I69398 Other sequelae of cerebral infarction: Secondary | ICD-10-CM | POA: Diagnosis not present

## 2021-11-08 DIAGNOSIS — E43 Unspecified severe protein-calorie malnutrition: Secondary | ICD-10-CM | POA: Diagnosis not present

## 2021-11-08 DIAGNOSIS — Z8616 Personal history of COVID-19: Secondary | ICD-10-CM | POA: Diagnosis not present

## 2021-11-08 DIAGNOSIS — D649 Anemia, unspecified: Secondary | ICD-10-CM | POA: Diagnosis not present

## 2021-11-08 DIAGNOSIS — R42 Dizziness and giddiness: Secondary | ICD-10-CM | POA: Diagnosis not present

## 2021-11-08 DIAGNOSIS — E039 Hypothyroidism, unspecified: Secondary | ICD-10-CM | POA: Diagnosis not present

## 2021-11-09 DIAGNOSIS — G928 Other toxic encephalopathy: Secondary | ICD-10-CM | POA: Diagnosis not present

## 2021-11-09 DIAGNOSIS — N390 Urinary tract infection, site not specified: Secondary | ICD-10-CM | POA: Diagnosis not present

## 2021-11-09 DIAGNOSIS — I69398 Other sequelae of cerebral infarction: Secondary | ICD-10-CM | POA: Diagnosis not present

## 2021-11-09 DIAGNOSIS — J9601 Acute respiratory failure with hypoxia: Secondary | ICD-10-CM | POA: Diagnosis not present

## 2021-11-09 DIAGNOSIS — I69318 Other symptoms and signs involving cognitive functions following cerebral infarction: Secondary | ICD-10-CM | POA: Diagnosis not present

## 2021-11-09 DIAGNOSIS — B9620 Unspecified Escherichia coli [E. coli] as the cause of diseases classified elsewhere: Secondary | ICD-10-CM | POA: Diagnosis not present

## 2021-11-13 DIAGNOSIS — J9601 Acute respiratory failure with hypoxia: Secondary | ICD-10-CM | POA: Diagnosis not present

## 2021-11-13 DIAGNOSIS — I69318 Other symptoms and signs involving cognitive functions following cerebral infarction: Secondary | ICD-10-CM | POA: Diagnosis not present

## 2021-11-13 DIAGNOSIS — I69398 Other sequelae of cerebral infarction: Secondary | ICD-10-CM | POA: Diagnosis not present

## 2021-11-13 DIAGNOSIS — N390 Urinary tract infection, site not specified: Secondary | ICD-10-CM | POA: Diagnosis not present

## 2021-11-13 DIAGNOSIS — B9620 Unspecified Escherichia coli [E. coli] as the cause of diseases classified elsewhere: Secondary | ICD-10-CM | POA: Diagnosis not present

## 2021-11-13 DIAGNOSIS — G928 Other toxic encephalopathy: Secondary | ICD-10-CM | POA: Diagnosis not present

## 2021-11-15 DIAGNOSIS — G928 Other toxic encephalopathy: Secondary | ICD-10-CM | POA: Diagnosis not present

## 2021-11-15 DIAGNOSIS — N390 Urinary tract infection, site not specified: Secondary | ICD-10-CM | POA: Diagnosis not present

## 2021-11-15 DIAGNOSIS — J9601 Acute respiratory failure with hypoxia: Secondary | ICD-10-CM | POA: Diagnosis not present

## 2021-11-15 DIAGNOSIS — I69318 Other symptoms and signs involving cognitive functions following cerebral infarction: Secondary | ICD-10-CM | POA: Diagnosis not present

## 2021-11-15 DIAGNOSIS — I69398 Other sequelae of cerebral infarction: Secondary | ICD-10-CM | POA: Diagnosis not present

## 2021-11-15 DIAGNOSIS — B9620 Unspecified Escherichia coli [E. coli] as the cause of diseases classified elsewhere: Secondary | ICD-10-CM | POA: Diagnosis not present

## 2021-11-16 DIAGNOSIS — G928 Other toxic encephalopathy: Secondary | ICD-10-CM | POA: Diagnosis not present

## 2021-11-16 DIAGNOSIS — I69398 Other sequelae of cerebral infarction: Secondary | ICD-10-CM | POA: Diagnosis not present

## 2021-11-16 DIAGNOSIS — N390 Urinary tract infection, site not specified: Secondary | ICD-10-CM | POA: Diagnosis not present

## 2021-11-16 DIAGNOSIS — B9620 Unspecified Escherichia coli [E. coli] as the cause of diseases classified elsewhere: Secondary | ICD-10-CM | POA: Diagnosis not present

## 2021-11-16 DIAGNOSIS — J9601 Acute respiratory failure with hypoxia: Secondary | ICD-10-CM | POA: Diagnosis not present

## 2021-11-16 DIAGNOSIS — I69318 Other symptoms and signs involving cognitive functions following cerebral infarction: Secondary | ICD-10-CM | POA: Diagnosis not present

## 2021-11-17 DIAGNOSIS — I69318 Other symptoms and signs involving cognitive functions following cerebral infarction: Secondary | ICD-10-CM | POA: Diagnosis not present

## 2021-11-17 DIAGNOSIS — J9601 Acute respiratory failure with hypoxia: Secondary | ICD-10-CM | POA: Diagnosis not present

## 2021-11-17 DIAGNOSIS — N390 Urinary tract infection, site not specified: Secondary | ICD-10-CM | POA: Diagnosis not present

## 2021-11-17 DIAGNOSIS — G928 Other toxic encephalopathy: Secondary | ICD-10-CM | POA: Diagnosis not present

## 2021-11-17 DIAGNOSIS — I69398 Other sequelae of cerebral infarction: Secondary | ICD-10-CM | POA: Diagnosis not present

## 2021-11-17 DIAGNOSIS — B9620 Unspecified Escherichia coli [E. coli] as the cause of diseases classified elsewhere: Secondary | ICD-10-CM | POA: Diagnosis not present

## 2021-11-20 DIAGNOSIS — E039 Hypothyroidism, unspecified: Secondary | ICD-10-CM | POA: Diagnosis not present

## 2021-11-20 DIAGNOSIS — G928 Other toxic encephalopathy: Secondary | ICD-10-CM | POA: Diagnosis not present

## 2021-11-20 DIAGNOSIS — I10 Essential (primary) hypertension: Secondary | ICD-10-CM | POA: Diagnosis not present

## 2021-11-20 DIAGNOSIS — G459 Transient cerebral ischemic attack, unspecified: Secondary | ICD-10-CM | POA: Diagnosis not present

## 2021-11-20 DIAGNOSIS — I69318 Other symptoms and signs involving cognitive functions following cerebral infarction: Secondary | ICD-10-CM | POA: Diagnosis not present

## 2021-11-20 DIAGNOSIS — D518 Other vitamin B12 deficiency anemias: Secondary | ICD-10-CM | POA: Diagnosis not present

## 2021-11-20 DIAGNOSIS — I69398 Other sequelae of cerebral infarction: Secondary | ICD-10-CM | POA: Diagnosis not present

## 2021-11-20 DIAGNOSIS — E782 Mixed hyperlipidemia: Secondary | ICD-10-CM | POA: Diagnosis not present

## 2021-11-20 DIAGNOSIS — E119 Type 2 diabetes mellitus without complications: Secondary | ICD-10-CM | POA: Diagnosis not present

## 2021-11-20 DIAGNOSIS — N390 Urinary tract infection, site not specified: Secondary | ICD-10-CM | POA: Diagnosis not present

## 2021-11-20 DIAGNOSIS — E559 Vitamin D deficiency, unspecified: Secondary | ICD-10-CM | POA: Diagnosis not present

## 2021-11-20 DIAGNOSIS — D649 Anemia, unspecified: Secondary | ICD-10-CM | POA: Diagnosis not present

## 2021-11-20 DIAGNOSIS — E038 Other specified hypothyroidism: Secondary | ICD-10-CM | POA: Diagnosis not present

## 2021-11-20 DIAGNOSIS — B9620 Unspecified Escherichia coli [E. coli] as the cause of diseases classified elsewhere: Secondary | ICD-10-CM | POA: Diagnosis not present

## 2021-11-20 DIAGNOSIS — C50919 Malignant neoplasm of unspecified site of unspecified female breast: Secondary | ICD-10-CM | POA: Diagnosis not present

## 2021-11-20 DIAGNOSIS — J9601 Acute respiratory failure with hypoxia: Secondary | ICD-10-CM | POA: Diagnosis not present

## 2021-11-21 DIAGNOSIS — G928 Other toxic encephalopathy: Secondary | ICD-10-CM | POA: Diagnosis not present

## 2021-11-21 DIAGNOSIS — N390 Urinary tract infection, site not specified: Secondary | ICD-10-CM | POA: Diagnosis not present

## 2021-11-21 DIAGNOSIS — B9620 Unspecified Escherichia coli [E. coli] as the cause of diseases classified elsewhere: Secondary | ICD-10-CM | POA: Diagnosis not present

## 2021-11-21 DIAGNOSIS — J9601 Acute respiratory failure with hypoxia: Secondary | ICD-10-CM | POA: Diagnosis not present

## 2021-11-21 DIAGNOSIS — I69318 Other symptoms and signs involving cognitive functions following cerebral infarction: Secondary | ICD-10-CM | POA: Diagnosis not present

## 2021-11-21 DIAGNOSIS — I69398 Other sequelae of cerebral infarction: Secondary | ICD-10-CM | POA: Diagnosis not present

## 2021-11-22 DIAGNOSIS — G928 Other toxic encephalopathy: Secondary | ICD-10-CM | POA: Diagnosis not present

## 2021-11-22 DIAGNOSIS — J9601 Acute respiratory failure with hypoxia: Secondary | ICD-10-CM | POA: Diagnosis not present

## 2021-11-22 DIAGNOSIS — I69318 Other symptoms and signs involving cognitive functions following cerebral infarction: Secondary | ICD-10-CM | POA: Diagnosis not present

## 2021-11-22 DIAGNOSIS — I69398 Other sequelae of cerebral infarction: Secondary | ICD-10-CM | POA: Diagnosis not present

## 2021-11-22 DIAGNOSIS — N390 Urinary tract infection, site not specified: Secondary | ICD-10-CM | POA: Diagnosis not present

## 2021-11-22 DIAGNOSIS — B9620 Unspecified Escherichia coli [E. coli] as the cause of diseases classified elsewhere: Secondary | ICD-10-CM | POA: Diagnosis not present

## 2021-11-23 DIAGNOSIS — E039 Hypothyroidism, unspecified: Secondary | ICD-10-CM | POA: Diagnosis not present

## 2021-11-23 DIAGNOSIS — B9620 Unspecified Escherichia coli [E. coli] as the cause of diseases classified elsewhere: Secondary | ICD-10-CM | POA: Diagnosis not present

## 2021-11-23 DIAGNOSIS — I69398 Other sequelae of cerebral infarction: Secondary | ICD-10-CM | POA: Diagnosis not present

## 2021-11-23 DIAGNOSIS — E559 Vitamin D deficiency, unspecified: Secondary | ICD-10-CM | POA: Diagnosis not present

## 2021-11-23 DIAGNOSIS — I69318 Other symptoms and signs involving cognitive functions following cerebral infarction: Secondary | ICD-10-CM | POA: Diagnosis not present

## 2021-11-23 DIAGNOSIS — J9601 Acute respiratory failure with hypoxia: Secondary | ICD-10-CM | POA: Diagnosis not present

## 2021-11-23 DIAGNOSIS — R627 Adult failure to thrive: Secondary | ICD-10-CM | POA: Diagnosis not present

## 2021-11-23 DIAGNOSIS — I10 Essential (primary) hypertension: Secondary | ICD-10-CM | POA: Diagnosis not present

## 2021-11-23 DIAGNOSIS — I447 Left bundle-branch block, unspecified: Secondary | ICD-10-CM | POA: Diagnosis not present

## 2021-11-23 DIAGNOSIS — R42 Dizziness and giddiness: Secondary | ICD-10-CM | POA: Diagnosis not present

## 2021-11-23 DIAGNOSIS — E119 Type 2 diabetes mellitus without complications: Secondary | ICD-10-CM | POA: Diagnosis not present

## 2021-11-23 DIAGNOSIS — G459 Transient cerebral ischemic attack, unspecified: Secondary | ICD-10-CM | POA: Diagnosis not present

## 2021-11-23 DIAGNOSIS — C50919 Malignant neoplasm of unspecified site of unspecified female breast: Secondary | ICD-10-CM | POA: Diagnosis not present

## 2021-11-23 DIAGNOSIS — N390 Urinary tract infection, site not specified: Secondary | ICD-10-CM | POA: Diagnosis not present

## 2021-11-23 DIAGNOSIS — G928 Other toxic encephalopathy: Secondary | ICD-10-CM | POA: Diagnosis not present

## 2021-11-27 DIAGNOSIS — J9601 Acute respiratory failure with hypoxia: Secondary | ICD-10-CM | POA: Diagnosis not present

## 2021-11-27 DIAGNOSIS — I69398 Other sequelae of cerebral infarction: Secondary | ICD-10-CM | POA: Diagnosis not present

## 2021-11-27 DIAGNOSIS — B9620 Unspecified Escherichia coli [E. coli] as the cause of diseases classified elsewhere: Secondary | ICD-10-CM | POA: Diagnosis not present

## 2021-11-27 DIAGNOSIS — N390 Urinary tract infection, site not specified: Secondary | ICD-10-CM | POA: Diagnosis not present

## 2021-11-27 DIAGNOSIS — G928 Other toxic encephalopathy: Secondary | ICD-10-CM | POA: Diagnosis not present

## 2021-11-27 DIAGNOSIS — I69318 Other symptoms and signs involving cognitive functions following cerebral infarction: Secondary | ICD-10-CM | POA: Diagnosis not present

## 2021-11-29 DIAGNOSIS — I69318 Other symptoms and signs involving cognitive functions following cerebral infarction: Secondary | ICD-10-CM | POA: Diagnosis not present

## 2021-11-29 DIAGNOSIS — N390 Urinary tract infection, site not specified: Secondary | ICD-10-CM | POA: Diagnosis not present

## 2021-11-29 DIAGNOSIS — B9620 Unspecified Escherichia coli [E. coli] as the cause of diseases classified elsewhere: Secondary | ICD-10-CM | POA: Diagnosis not present

## 2021-11-29 DIAGNOSIS — I69398 Other sequelae of cerebral infarction: Secondary | ICD-10-CM | POA: Diagnosis not present

## 2021-11-29 DIAGNOSIS — J9601 Acute respiratory failure with hypoxia: Secondary | ICD-10-CM | POA: Diagnosis not present

## 2021-11-29 DIAGNOSIS — G928 Other toxic encephalopathy: Secondary | ICD-10-CM | POA: Diagnosis not present

## 2021-11-30 DIAGNOSIS — B9620 Unspecified Escherichia coli [E. coli] as the cause of diseases classified elsewhere: Secondary | ICD-10-CM | POA: Diagnosis not present

## 2021-11-30 DIAGNOSIS — G928 Other toxic encephalopathy: Secondary | ICD-10-CM | POA: Diagnosis not present

## 2021-11-30 DIAGNOSIS — J9601 Acute respiratory failure with hypoxia: Secondary | ICD-10-CM | POA: Diagnosis not present

## 2021-11-30 DIAGNOSIS — N390 Urinary tract infection, site not specified: Secondary | ICD-10-CM | POA: Diagnosis not present

## 2021-11-30 DIAGNOSIS — I69318 Other symptoms and signs involving cognitive functions following cerebral infarction: Secondary | ICD-10-CM | POA: Diagnosis not present

## 2021-11-30 DIAGNOSIS — I69398 Other sequelae of cerebral infarction: Secondary | ICD-10-CM | POA: Diagnosis not present

## 2021-12-01 DIAGNOSIS — I69398 Other sequelae of cerebral infarction: Secondary | ICD-10-CM | POA: Diagnosis not present

## 2021-12-01 DIAGNOSIS — N390 Urinary tract infection, site not specified: Secondary | ICD-10-CM | POA: Diagnosis not present

## 2021-12-01 DIAGNOSIS — G928 Other toxic encephalopathy: Secondary | ICD-10-CM | POA: Diagnosis not present

## 2021-12-01 DIAGNOSIS — J9601 Acute respiratory failure with hypoxia: Secondary | ICD-10-CM | POA: Diagnosis not present

## 2021-12-01 DIAGNOSIS — I69318 Other symptoms and signs involving cognitive functions following cerebral infarction: Secondary | ICD-10-CM | POA: Diagnosis not present

## 2021-12-01 DIAGNOSIS — B9620 Unspecified Escherichia coli [E. coli] as the cause of diseases classified elsewhere: Secondary | ICD-10-CM | POA: Diagnosis not present

## 2021-12-04 DIAGNOSIS — B9620 Unspecified Escherichia coli [E. coli] as the cause of diseases classified elsewhere: Secondary | ICD-10-CM | POA: Diagnosis not present

## 2021-12-04 DIAGNOSIS — G928 Other toxic encephalopathy: Secondary | ICD-10-CM | POA: Diagnosis not present

## 2021-12-04 DIAGNOSIS — I69318 Other symptoms and signs involving cognitive functions following cerebral infarction: Secondary | ICD-10-CM | POA: Diagnosis not present

## 2021-12-04 DIAGNOSIS — I69398 Other sequelae of cerebral infarction: Secondary | ICD-10-CM | POA: Diagnosis not present

## 2021-12-04 DIAGNOSIS — J9601 Acute respiratory failure with hypoxia: Secondary | ICD-10-CM | POA: Diagnosis not present

## 2021-12-04 DIAGNOSIS — N390 Urinary tract infection, site not specified: Secondary | ICD-10-CM | POA: Diagnosis not present

## 2021-12-05 DIAGNOSIS — I10 Essential (primary) hypertension: Secondary | ICD-10-CM | POA: Diagnosis not present

## 2021-12-06 DIAGNOSIS — N390 Urinary tract infection, site not specified: Secondary | ICD-10-CM | POA: Diagnosis not present

## 2021-12-06 DIAGNOSIS — I69318 Other symptoms and signs involving cognitive functions following cerebral infarction: Secondary | ICD-10-CM | POA: Diagnosis not present

## 2021-12-06 DIAGNOSIS — B9620 Unspecified Escherichia coli [E. coli] as the cause of diseases classified elsewhere: Secondary | ICD-10-CM | POA: Diagnosis not present

## 2021-12-06 DIAGNOSIS — G928 Other toxic encephalopathy: Secondary | ICD-10-CM | POA: Diagnosis not present

## 2021-12-06 DIAGNOSIS — J9601 Acute respiratory failure with hypoxia: Secondary | ICD-10-CM | POA: Diagnosis not present

## 2021-12-06 DIAGNOSIS — I69398 Other sequelae of cerebral infarction: Secondary | ICD-10-CM | POA: Diagnosis not present

## 2021-12-07 DIAGNOSIS — N390 Urinary tract infection, site not specified: Secondary | ICD-10-CM | POA: Diagnosis not present

## 2021-12-07 DIAGNOSIS — I69398 Other sequelae of cerebral infarction: Secondary | ICD-10-CM | POA: Diagnosis not present

## 2021-12-07 DIAGNOSIS — J9601 Acute respiratory failure with hypoxia: Secondary | ICD-10-CM | POA: Diagnosis not present

## 2021-12-07 DIAGNOSIS — I69318 Other symptoms and signs involving cognitive functions following cerebral infarction: Secondary | ICD-10-CM | POA: Diagnosis not present

## 2021-12-07 DIAGNOSIS — G928 Other toxic encephalopathy: Secondary | ICD-10-CM | POA: Diagnosis not present

## 2021-12-07 DIAGNOSIS — B9620 Unspecified Escherichia coli [E. coli] as the cause of diseases classified elsewhere: Secondary | ICD-10-CM | POA: Diagnosis not present

## 2021-12-09 DIAGNOSIS — D649 Anemia, unspecified: Secondary | ICD-10-CM | POA: Diagnosis not present

## 2021-12-09 DIAGNOSIS — E785 Hyperlipidemia, unspecified: Secondary | ICD-10-CM | POA: Diagnosis not present

## 2021-12-09 DIAGNOSIS — C50912 Malignant neoplasm of unspecified site of left female breast: Secondary | ICD-10-CM | POA: Diagnosis not present

## 2021-12-09 DIAGNOSIS — R42 Dizziness and giddiness: Secondary | ICD-10-CM | POA: Diagnosis not present

## 2021-12-09 DIAGNOSIS — I69318 Other symptoms and signs involving cognitive functions following cerebral infarction: Secondary | ICD-10-CM | POA: Diagnosis not present

## 2021-12-09 DIAGNOSIS — J9601 Acute respiratory failure with hypoxia: Secondary | ICD-10-CM | POA: Diagnosis not present

## 2021-12-09 DIAGNOSIS — D709 Neutropenia, unspecified: Secondary | ICD-10-CM | POA: Diagnosis not present

## 2021-12-09 DIAGNOSIS — F32A Depression, unspecified: Secondary | ICD-10-CM | POA: Diagnosis not present

## 2021-12-09 DIAGNOSIS — B9620 Unspecified Escherichia coli [E. coli] as the cause of diseases classified elsewhere: Secondary | ICD-10-CM | POA: Diagnosis not present

## 2021-12-09 DIAGNOSIS — I69398 Other sequelae of cerebral infarction: Secondary | ICD-10-CM | POA: Diagnosis not present

## 2021-12-09 DIAGNOSIS — E119 Type 2 diabetes mellitus without complications: Secondary | ICD-10-CM | POA: Diagnosis not present

## 2021-12-09 DIAGNOSIS — E43 Unspecified severe protein-calorie malnutrition: Secondary | ICD-10-CM | POA: Diagnosis not present

## 2021-12-09 DIAGNOSIS — E039 Hypothyroidism, unspecified: Secondary | ICD-10-CM | POA: Diagnosis not present

## 2021-12-09 DIAGNOSIS — M5000 Cervical disc disorder with myelopathy, unspecified cervical region: Secondary | ICD-10-CM | POA: Diagnosis not present

## 2021-12-09 DIAGNOSIS — N390 Urinary tract infection, site not specified: Secondary | ICD-10-CM | POA: Diagnosis not present

## 2021-12-09 DIAGNOSIS — I10 Essential (primary) hypertension: Secondary | ICD-10-CM | POA: Diagnosis not present

## 2021-12-09 DIAGNOSIS — G928 Other toxic encephalopathy: Secondary | ICD-10-CM | POA: Diagnosis not present

## 2021-12-09 DIAGNOSIS — Z8616 Personal history of COVID-19: Secondary | ICD-10-CM | POA: Diagnosis not present

## 2021-12-09 DIAGNOSIS — H1132 Conjunctival hemorrhage, left eye: Secondary | ICD-10-CM | POA: Diagnosis not present

## 2021-12-09 DIAGNOSIS — G40909 Epilepsy, unspecified, not intractable, without status epilepticus: Secondary | ICD-10-CM | POA: Diagnosis not present

## 2021-12-11 DIAGNOSIS — G928 Other toxic encephalopathy: Secondary | ICD-10-CM | POA: Diagnosis not present

## 2021-12-11 DIAGNOSIS — B9620 Unspecified Escherichia coli [E. coli] as the cause of diseases classified elsewhere: Secondary | ICD-10-CM | POA: Diagnosis not present

## 2021-12-11 DIAGNOSIS — I69318 Other symptoms and signs involving cognitive functions following cerebral infarction: Secondary | ICD-10-CM | POA: Diagnosis not present

## 2021-12-11 DIAGNOSIS — N390 Urinary tract infection, site not specified: Secondary | ICD-10-CM | POA: Diagnosis not present

## 2021-12-11 DIAGNOSIS — I69398 Other sequelae of cerebral infarction: Secondary | ICD-10-CM | POA: Diagnosis not present

## 2021-12-11 DIAGNOSIS — J9601 Acute respiratory failure with hypoxia: Secondary | ICD-10-CM | POA: Diagnosis not present

## 2021-12-12 DIAGNOSIS — J9601 Acute respiratory failure with hypoxia: Secondary | ICD-10-CM | POA: Diagnosis not present

## 2021-12-12 DIAGNOSIS — N390 Urinary tract infection, site not specified: Secondary | ICD-10-CM | POA: Diagnosis not present

## 2021-12-12 DIAGNOSIS — B9620 Unspecified Escherichia coli [E. coli] as the cause of diseases classified elsewhere: Secondary | ICD-10-CM | POA: Diagnosis not present

## 2021-12-12 DIAGNOSIS — G928 Other toxic encephalopathy: Secondary | ICD-10-CM | POA: Diagnosis not present

## 2021-12-12 DIAGNOSIS — I69318 Other symptoms and signs involving cognitive functions following cerebral infarction: Secondary | ICD-10-CM | POA: Diagnosis not present

## 2021-12-12 DIAGNOSIS — I69398 Other sequelae of cerebral infarction: Secondary | ICD-10-CM | POA: Diagnosis not present

## 2021-12-13 DIAGNOSIS — I69398 Other sequelae of cerebral infarction: Secondary | ICD-10-CM | POA: Diagnosis not present

## 2021-12-13 DIAGNOSIS — B9620 Unspecified Escherichia coli [E. coli] as the cause of diseases classified elsewhere: Secondary | ICD-10-CM | POA: Diagnosis not present

## 2021-12-13 DIAGNOSIS — N390 Urinary tract infection, site not specified: Secondary | ICD-10-CM | POA: Diagnosis not present

## 2021-12-13 DIAGNOSIS — G928 Other toxic encephalopathy: Secondary | ICD-10-CM | POA: Diagnosis not present

## 2021-12-13 DIAGNOSIS — I69318 Other symptoms and signs involving cognitive functions following cerebral infarction: Secondary | ICD-10-CM | POA: Diagnosis not present

## 2021-12-13 DIAGNOSIS — J9601 Acute respiratory failure with hypoxia: Secondary | ICD-10-CM | POA: Diagnosis not present

## 2021-12-14 DIAGNOSIS — J9601 Acute respiratory failure with hypoxia: Secondary | ICD-10-CM | POA: Diagnosis not present

## 2021-12-14 DIAGNOSIS — B9620 Unspecified Escherichia coli [E. coli] as the cause of diseases classified elsewhere: Secondary | ICD-10-CM | POA: Diagnosis not present

## 2021-12-14 DIAGNOSIS — N390 Urinary tract infection, site not specified: Secondary | ICD-10-CM | POA: Diagnosis not present

## 2021-12-14 DIAGNOSIS — G928 Other toxic encephalopathy: Secondary | ICD-10-CM | POA: Diagnosis not present

## 2021-12-14 DIAGNOSIS — I69318 Other symptoms and signs involving cognitive functions following cerebral infarction: Secondary | ICD-10-CM | POA: Diagnosis not present

## 2021-12-14 DIAGNOSIS — I69398 Other sequelae of cerebral infarction: Secondary | ICD-10-CM | POA: Diagnosis not present

## 2021-12-18 DIAGNOSIS — I69318 Other symptoms and signs involving cognitive functions following cerebral infarction: Secondary | ICD-10-CM | POA: Diagnosis not present

## 2021-12-18 DIAGNOSIS — B9620 Unspecified Escherichia coli [E. coli] as the cause of diseases classified elsewhere: Secondary | ICD-10-CM | POA: Diagnosis not present

## 2021-12-18 DIAGNOSIS — I69398 Other sequelae of cerebral infarction: Secondary | ICD-10-CM | POA: Diagnosis not present

## 2021-12-18 DIAGNOSIS — G928 Other toxic encephalopathy: Secondary | ICD-10-CM | POA: Diagnosis not present

## 2021-12-18 DIAGNOSIS — J9601 Acute respiratory failure with hypoxia: Secondary | ICD-10-CM | POA: Diagnosis not present

## 2021-12-18 DIAGNOSIS — N390 Urinary tract infection, site not specified: Secondary | ICD-10-CM | POA: Diagnosis not present

## 2021-12-20 DIAGNOSIS — G928 Other toxic encephalopathy: Secondary | ICD-10-CM | POA: Diagnosis not present

## 2021-12-20 DIAGNOSIS — I69398 Other sequelae of cerebral infarction: Secondary | ICD-10-CM | POA: Diagnosis not present

## 2021-12-20 DIAGNOSIS — I69318 Other symptoms and signs involving cognitive functions following cerebral infarction: Secondary | ICD-10-CM | POA: Diagnosis not present

## 2021-12-20 DIAGNOSIS — N390 Urinary tract infection, site not specified: Secondary | ICD-10-CM | POA: Diagnosis not present

## 2021-12-20 DIAGNOSIS — B9620 Unspecified Escherichia coli [E. coli] as the cause of diseases classified elsewhere: Secondary | ICD-10-CM | POA: Diagnosis not present

## 2021-12-20 DIAGNOSIS — J9601 Acute respiratory failure with hypoxia: Secondary | ICD-10-CM | POA: Diagnosis not present

## 2021-12-21 DIAGNOSIS — I69398 Other sequelae of cerebral infarction: Secondary | ICD-10-CM | POA: Diagnosis not present

## 2021-12-21 DIAGNOSIS — G928 Other toxic encephalopathy: Secondary | ICD-10-CM | POA: Diagnosis not present

## 2021-12-21 DIAGNOSIS — I69318 Other symptoms and signs involving cognitive functions following cerebral infarction: Secondary | ICD-10-CM | POA: Diagnosis not present

## 2021-12-21 DIAGNOSIS — N390 Urinary tract infection, site not specified: Secondary | ICD-10-CM | POA: Diagnosis not present

## 2021-12-21 DIAGNOSIS — J9601 Acute respiratory failure with hypoxia: Secondary | ICD-10-CM | POA: Diagnosis not present

## 2021-12-21 DIAGNOSIS — B9620 Unspecified Escherichia coli [E. coli] as the cause of diseases classified elsewhere: Secondary | ICD-10-CM | POA: Diagnosis not present

## 2021-12-25 DIAGNOSIS — E782 Mixed hyperlipidemia: Secondary | ICD-10-CM | POA: Diagnosis not present

## 2021-12-25 DIAGNOSIS — E119 Type 2 diabetes mellitus without complications: Secondary | ICD-10-CM | POA: Diagnosis not present

## 2021-12-25 DIAGNOSIS — D518 Other vitamin B12 deficiency anemias: Secondary | ICD-10-CM | POA: Diagnosis not present

## 2021-12-25 DIAGNOSIS — I69318 Other symptoms and signs involving cognitive functions following cerebral infarction: Secondary | ICD-10-CM | POA: Diagnosis not present

## 2021-12-25 DIAGNOSIS — J9601 Acute respiratory failure with hypoxia: Secondary | ICD-10-CM | POA: Diagnosis not present

## 2021-12-25 DIAGNOSIS — I10 Essential (primary) hypertension: Secondary | ICD-10-CM | POA: Diagnosis not present

## 2021-12-25 DIAGNOSIS — E038 Other specified hypothyroidism: Secondary | ICD-10-CM | POA: Diagnosis not present

## 2021-12-25 DIAGNOSIS — E559 Vitamin D deficiency, unspecified: Secondary | ICD-10-CM | POA: Diagnosis not present

## 2021-12-25 DIAGNOSIS — D649 Anemia, unspecified: Secondary | ICD-10-CM | POA: Diagnosis not present

## 2021-12-25 DIAGNOSIS — E039 Hypothyroidism, unspecified: Secondary | ICD-10-CM | POA: Diagnosis not present

## 2021-12-25 DIAGNOSIS — I69398 Other sequelae of cerebral infarction: Secondary | ICD-10-CM | POA: Diagnosis not present

## 2021-12-25 DIAGNOSIS — B9620 Unspecified Escherichia coli [E. coli] as the cause of diseases classified elsewhere: Secondary | ICD-10-CM | POA: Diagnosis not present

## 2021-12-25 DIAGNOSIS — N390 Urinary tract infection, site not specified: Secondary | ICD-10-CM | POA: Diagnosis not present

## 2021-12-25 DIAGNOSIS — C50919 Malignant neoplasm of unspecified site of unspecified female breast: Secondary | ICD-10-CM | POA: Diagnosis not present

## 2021-12-25 DIAGNOSIS — G459 Transient cerebral ischemic attack, unspecified: Secondary | ICD-10-CM | POA: Diagnosis not present

## 2021-12-25 DIAGNOSIS — G928 Other toxic encephalopathy: Secondary | ICD-10-CM | POA: Diagnosis not present

## 2021-12-26 DIAGNOSIS — I69318 Other symptoms and signs involving cognitive functions following cerebral infarction: Secondary | ICD-10-CM | POA: Diagnosis not present

## 2021-12-26 DIAGNOSIS — N390 Urinary tract infection, site not specified: Secondary | ICD-10-CM | POA: Diagnosis not present

## 2021-12-26 DIAGNOSIS — B9620 Unspecified Escherichia coli [E. coli] as the cause of diseases classified elsewhere: Secondary | ICD-10-CM | POA: Diagnosis not present

## 2021-12-26 DIAGNOSIS — I69398 Other sequelae of cerebral infarction: Secondary | ICD-10-CM | POA: Diagnosis not present

## 2021-12-26 DIAGNOSIS — J9601 Acute respiratory failure with hypoxia: Secondary | ICD-10-CM | POA: Diagnosis not present

## 2021-12-26 DIAGNOSIS — G928 Other toxic encephalopathy: Secondary | ICD-10-CM | POA: Diagnosis not present

## 2021-12-27 DIAGNOSIS — I69398 Other sequelae of cerebral infarction: Secondary | ICD-10-CM | POA: Diagnosis not present

## 2021-12-27 DIAGNOSIS — B9620 Unspecified Escherichia coli [E. coli] as the cause of diseases classified elsewhere: Secondary | ICD-10-CM | POA: Diagnosis not present

## 2021-12-27 DIAGNOSIS — N390 Urinary tract infection, site not specified: Secondary | ICD-10-CM | POA: Diagnosis not present

## 2021-12-27 DIAGNOSIS — J9601 Acute respiratory failure with hypoxia: Secondary | ICD-10-CM | POA: Diagnosis not present

## 2021-12-27 DIAGNOSIS — G928 Other toxic encephalopathy: Secondary | ICD-10-CM | POA: Diagnosis not present

## 2021-12-27 DIAGNOSIS — I69318 Other symptoms and signs involving cognitive functions following cerebral infarction: Secondary | ICD-10-CM | POA: Diagnosis not present

## 2021-12-28 ENCOUNTER — Encounter (HOSPITAL_COMMUNITY): Payer: Self-pay

## 2021-12-28 DIAGNOSIS — J9601 Acute respiratory failure with hypoxia: Secondary | ICD-10-CM | POA: Diagnosis not present

## 2021-12-28 DIAGNOSIS — N390 Urinary tract infection, site not specified: Secondary | ICD-10-CM | POA: Diagnosis not present

## 2021-12-28 DIAGNOSIS — G928 Other toxic encephalopathy: Secondary | ICD-10-CM | POA: Diagnosis not present

## 2021-12-28 DIAGNOSIS — B9620 Unspecified Escherichia coli [E. coli] as the cause of diseases classified elsewhere: Secondary | ICD-10-CM | POA: Diagnosis not present

## 2021-12-28 DIAGNOSIS — I69318 Other symptoms and signs involving cognitive functions following cerebral infarction: Secondary | ICD-10-CM | POA: Diagnosis not present

## 2021-12-28 DIAGNOSIS — I69398 Other sequelae of cerebral infarction: Secondary | ICD-10-CM | POA: Diagnosis not present

## 2022-01-01 DIAGNOSIS — D649 Anemia, unspecified: Secondary | ICD-10-CM | POA: Diagnosis not present

## 2022-01-01 DIAGNOSIS — R627 Adult failure to thrive: Secondary | ICD-10-CM | POA: Diagnosis not present

## 2022-01-01 DIAGNOSIS — G459 Transient cerebral ischemic attack, unspecified: Secondary | ICD-10-CM | POA: Diagnosis not present

## 2022-01-01 DIAGNOSIS — R42 Dizziness and giddiness: Secondary | ICD-10-CM | POA: Diagnosis not present

## 2022-01-01 DIAGNOSIS — I447 Left bundle-branch block, unspecified: Secondary | ICD-10-CM | POA: Diagnosis not present

## 2022-01-01 DIAGNOSIS — I10 Essential (primary) hypertension: Secondary | ICD-10-CM | POA: Diagnosis not present

## 2022-01-01 DIAGNOSIS — E559 Vitamin D deficiency, unspecified: Secondary | ICD-10-CM | POA: Diagnosis not present

## 2022-01-01 DIAGNOSIS — J9601 Acute respiratory failure with hypoxia: Secondary | ICD-10-CM | POA: Diagnosis not present

## 2022-01-01 DIAGNOSIS — I69398 Other sequelae of cerebral infarction: Secondary | ICD-10-CM | POA: Diagnosis not present

## 2022-01-01 DIAGNOSIS — C50919 Malignant neoplasm of unspecified site of unspecified female breast: Secondary | ICD-10-CM | POA: Diagnosis not present

## 2022-01-01 DIAGNOSIS — I69318 Other symptoms and signs involving cognitive functions following cerebral infarction: Secondary | ICD-10-CM | POA: Diagnosis not present

## 2022-01-01 DIAGNOSIS — N390 Urinary tract infection, site not specified: Secondary | ICD-10-CM | POA: Diagnosis not present

## 2022-01-01 DIAGNOSIS — E119 Type 2 diabetes mellitus without complications: Secondary | ICD-10-CM | POA: Diagnosis not present

## 2022-01-01 DIAGNOSIS — G928 Other toxic encephalopathy: Secondary | ICD-10-CM | POA: Diagnosis not present

## 2022-01-01 DIAGNOSIS — F32A Depression, unspecified: Secondary | ICD-10-CM | POA: Diagnosis not present

## 2022-01-01 DIAGNOSIS — B9620 Unspecified Escherichia coli [E. coli] as the cause of diseases classified elsewhere: Secondary | ICD-10-CM | POA: Diagnosis not present

## 2022-01-01 DIAGNOSIS — E039 Hypothyroidism, unspecified: Secondary | ICD-10-CM | POA: Diagnosis not present

## 2022-01-02 DIAGNOSIS — N390 Urinary tract infection, site not specified: Secondary | ICD-10-CM | POA: Diagnosis not present

## 2022-01-02 DIAGNOSIS — J9601 Acute respiratory failure with hypoxia: Secondary | ICD-10-CM | POA: Diagnosis not present

## 2022-01-02 DIAGNOSIS — B9620 Unspecified Escherichia coli [E. coli] as the cause of diseases classified elsewhere: Secondary | ICD-10-CM | POA: Diagnosis not present

## 2022-01-02 DIAGNOSIS — I69398 Other sequelae of cerebral infarction: Secondary | ICD-10-CM | POA: Diagnosis not present

## 2022-01-02 DIAGNOSIS — G928 Other toxic encephalopathy: Secondary | ICD-10-CM | POA: Diagnosis not present

## 2022-01-02 DIAGNOSIS — I69318 Other symptoms and signs involving cognitive functions following cerebral infarction: Secondary | ICD-10-CM | POA: Diagnosis not present

## 2022-01-03 DIAGNOSIS — G928 Other toxic encephalopathy: Secondary | ICD-10-CM | POA: Diagnosis not present

## 2022-01-03 DIAGNOSIS — I69318 Other symptoms and signs involving cognitive functions following cerebral infarction: Secondary | ICD-10-CM | POA: Diagnosis not present

## 2022-01-03 DIAGNOSIS — J9601 Acute respiratory failure with hypoxia: Secondary | ICD-10-CM | POA: Diagnosis not present

## 2022-01-03 DIAGNOSIS — I69398 Other sequelae of cerebral infarction: Secondary | ICD-10-CM | POA: Diagnosis not present

## 2022-01-03 DIAGNOSIS — B9620 Unspecified Escherichia coli [E. coli] as the cause of diseases classified elsewhere: Secondary | ICD-10-CM | POA: Diagnosis not present

## 2022-01-03 DIAGNOSIS — N390 Urinary tract infection, site not specified: Secondary | ICD-10-CM | POA: Diagnosis not present

## 2022-01-04 DIAGNOSIS — N390 Urinary tract infection, site not specified: Secondary | ICD-10-CM | POA: Diagnosis not present

## 2022-01-04 DIAGNOSIS — I69318 Other symptoms and signs involving cognitive functions following cerebral infarction: Secondary | ICD-10-CM | POA: Diagnosis not present

## 2022-01-04 DIAGNOSIS — B9620 Unspecified Escherichia coli [E. coli] as the cause of diseases classified elsewhere: Secondary | ICD-10-CM | POA: Diagnosis not present

## 2022-01-04 DIAGNOSIS — G928 Other toxic encephalopathy: Secondary | ICD-10-CM | POA: Diagnosis not present

## 2022-01-04 DIAGNOSIS — J9601 Acute respiratory failure with hypoxia: Secondary | ICD-10-CM | POA: Diagnosis not present

## 2022-01-04 DIAGNOSIS — I69398 Other sequelae of cerebral infarction: Secondary | ICD-10-CM | POA: Diagnosis not present

## 2022-01-05 DIAGNOSIS — I10 Essential (primary) hypertension: Secondary | ICD-10-CM | POA: Diagnosis not present

## 2022-01-08 DIAGNOSIS — N390 Urinary tract infection, site not specified: Secondary | ICD-10-CM | POA: Diagnosis not present

## 2022-01-08 DIAGNOSIS — G40909 Epilepsy, unspecified, not intractable, without status epilepticus: Secondary | ICD-10-CM | POA: Diagnosis not present

## 2022-01-08 DIAGNOSIS — D649 Anemia, unspecified: Secondary | ICD-10-CM | POA: Diagnosis not present

## 2022-01-08 DIAGNOSIS — G928 Other toxic encephalopathy: Secondary | ICD-10-CM | POA: Diagnosis not present

## 2022-01-08 DIAGNOSIS — E785 Hyperlipidemia, unspecified: Secondary | ICD-10-CM | POA: Diagnosis not present

## 2022-01-08 DIAGNOSIS — Z8616 Personal history of COVID-19: Secondary | ICD-10-CM | POA: Diagnosis not present

## 2022-01-08 DIAGNOSIS — H1132 Conjunctival hemorrhage, left eye: Secondary | ICD-10-CM | POA: Diagnosis not present

## 2022-01-08 DIAGNOSIS — F32A Depression, unspecified: Secondary | ICD-10-CM | POA: Diagnosis not present

## 2022-01-08 DIAGNOSIS — M5000 Cervical disc disorder with myelopathy, unspecified cervical region: Secondary | ICD-10-CM | POA: Diagnosis not present

## 2022-01-08 DIAGNOSIS — R42 Dizziness and giddiness: Secondary | ICD-10-CM | POA: Diagnosis not present

## 2022-01-08 DIAGNOSIS — I69318 Other symptoms and signs involving cognitive functions following cerebral infarction: Secondary | ICD-10-CM | POA: Diagnosis not present

## 2022-01-08 DIAGNOSIS — E039 Hypothyroidism, unspecified: Secondary | ICD-10-CM | POA: Diagnosis not present

## 2022-01-08 DIAGNOSIS — I69398 Other sequelae of cerebral infarction: Secondary | ICD-10-CM | POA: Diagnosis not present

## 2022-01-08 DIAGNOSIS — B9620 Unspecified Escherichia coli [E. coli] as the cause of diseases classified elsewhere: Secondary | ICD-10-CM | POA: Diagnosis not present

## 2022-01-08 DIAGNOSIS — E43 Unspecified severe protein-calorie malnutrition: Secondary | ICD-10-CM | POA: Diagnosis not present

## 2022-01-08 DIAGNOSIS — J9601 Acute respiratory failure with hypoxia: Secondary | ICD-10-CM | POA: Diagnosis not present

## 2022-01-08 DIAGNOSIS — C50912 Malignant neoplasm of unspecified site of left female breast: Secondary | ICD-10-CM | POA: Diagnosis not present

## 2022-01-08 DIAGNOSIS — I10 Essential (primary) hypertension: Secondary | ICD-10-CM | POA: Diagnosis not present

## 2022-01-08 DIAGNOSIS — D709 Neutropenia, unspecified: Secondary | ICD-10-CM | POA: Diagnosis not present

## 2022-01-08 DIAGNOSIS — E119 Type 2 diabetes mellitus without complications: Secondary | ICD-10-CM | POA: Diagnosis not present

## 2022-01-10 DIAGNOSIS — J9601 Acute respiratory failure with hypoxia: Secondary | ICD-10-CM | POA: Diagnosis not present

## 2022-01-10 DIAGNOSIS — B9620 Unspecified Escherichia coli [E. coli] as the cause of diseases classified elsewhere: Secondary | ICD-10-CM | POA: Diagnosis not present

## 2022-01-10 DIAGNOSIS — I69398 Other sequelae of cerebral infarction: Secondary | ICD-10-CM | POA: Diagnosis not present

## 2022-01-10 DIAGNOSIS — G928 Other toxic encephalopathy: Secondary | ICD-10-CM | POA: Diagnosis not present

## 2022-01-10 DIAGNOSIS — I69318 Other symptoms and signs involving cognitive functions following cerebral infarction: Secondary | ICD-10-CM | POA: Diagnosis not present

## 2022-01-10 DIAGNOSIS — N390 Urinary tract infection, site not specified: Secondary | ICD-10-CM | POA: Diagnosis not present

## 2022-01-11 DIAGNOSIS — J9601 Acute respiratory failure with hypoxia: Secondary | ICD-10-CM | POA: Diagnosis not present

## 2022-01-11 DIAGNOSIS — B9620 Unspecified Escherichia coli [E. coli] as the cause of diseases classified elsewhere: Secondary | ICD-10-CM | POA: Diagnosis not present

## 2022-01-11 DIAGNOSIS — N390 Urinary tract infection, site not specified: Secondary | ICD-10-CM | POA: Diagnosis not present

## 2022-01-11 DIAGNOSIS — I69318 Other symptoms and signs involving cognitive functions following cerebral infarction: Secondary | ICD-10-CM | POA: Diagnosis not present

## 2022-01-11 DIAGNOSIS — I69398 Other sequelae of cerebral infarction: Secondary | ICD-10-CM | POA: Diagnosis not present

## 2022-01-11 DIAGNOSIS — G928 Other toxic encephalopathy: Secondary | ICD-10-CM | POA: Diagnosis not present

## 2022-01-15 DIAGNOSIS — I69318 Other symptoms and signs involving cognitive functions following cerebral infarction: Secondary | ICD-10-CM | POA: Diagnosis not present

## 2022-01-15 DIAGNOSIS — N390 Urinary tract infection, site not specified: Secondary | ICD-10-CM | POA: Diagnosis not present

## 2022-01-15 DIAGNOSIS — J9601 Acute respiratory failure with hypoxia: Secondary | ICD-10-CM | POA: Diagnosis not present

## 2022-01-15 DIAGNOSIS — B9620 Unspecified Escherichia coli [E. coli] as the cause of diseases classified elsewhere: Secondary | ICD-10-CM | POA: Diagnosis not present

## 2022-01-15 DIAGNOSIS — G928 Other toxic encephalopathy: Secondary | ICD-10-CM | POA: Diagnosis not present

## 2022-01-15 DIAGNOSIS — I69398 Other sequelae of cerebral infarction: Secondary | ICD-10-CM | POA: Diagnosis not present

## 2022-01-17 DIAGNOSIS — I69398 Other sequelae of cerebral infarction: Secondary | ICD-10-CM | POA: Diagnosis not present

## 2022-01-17 DIAGNOSIS — J9601 Acute respiratory failure with hypoxia: Secondary | ICD-10-CM | POA: Diagnosis not present

## 2022-01-17 DIAGNOSIS — N390 Urinary tract infection, site not specified: Secondary | ICD-10-CM | POA: Diagnosis not present

## 2022-01-17 DIAGNOSIS — B9620 Unspecified Escherichia coli [E. coli] as the cause of diseases classified elsewhere: Secondary | ICD-10-CM | POA: Diagnosis not present

## 2022-01-17 DIAGNOSIS — G928 Other toxic encephalopathy: Secondary | ICD-10-CM | POA: Diagnosis not present

## 2022-01-17 DIAGNOSIS — I69318 Other symptoms and signs involving cognitive functions following cerebral infarction: Secondary | ICD-10-CM | POA: Diagnosis not present

## 2022-01-18 DIAGNOSIS — I69318 Other symptoms and signs involving cognitive functions following cerebral infarction: Secondary | ICD-10-CM | POA: Diagnosis not present

## 2022-01-18 DIAGNOSIS — N390 Urinary tract infection, site not specified: Secondary | ICD-10-CM | POA: Diagnosis not present

## 2022-01-18 DIAGNOSIS — G928 Other toxic encephalopathy: Secondary | ICD-10-CM | POA: Diagnosis not present

## 2022-01-18 DIAGNOSIS — I69398 Other sequelae of cerebral infarction: Secondary | ICD-10-CM | POA: Diagnosis not present

## 2022-01-18 DIAGNOSIS — B9620 Unspecified Escherichia coli [E. coli] as the cause of diseases classified elsewhere: Secondary | ICD-10-CM | POA: Diagnosis not present

## 2022-01-18 DIAGNOSIS — J9601 Acute respiratory failure with hypoxia: Secondary | ICD-10-CM | POA: Diagnosis not present

## 2022-01-22 DIAGNOSIS — B9620 Unspecified Escherichia coli [E. coli] as the cause of diseases classified elsewhere: Secondary | ICD-10-CM | POA: Diagnosis not present

## 2022-01-22 DIAGNOSIS — J9601 Acute respiratory failure with hypoxia: Secondary | ICD-10-CM | POA: Diagnosis not present

## 2022-01-22 DIAGNOSIS — N390 Urinary tract infection, site not specified: Secondary | ICD-10-CM | POA: Diagnosis not present

## 2022-01-22 DIAGNOSIS — G928 Other toxic encephalopathy: Secondary | ICD-10-CM | POA: Diagnosis not present

## 2022-01-22 DIAGNOSIS — I69318 Other symptoms and signs involving cognitive functions following cerebral infarction: Secondary | ICD-10-CM | POA: Diagnosis not present

## 2022-01-22 DIAGNOSIS — I69398 Other sequelae of cerebral infarction: Secondary | ICD-10-CM | POA: Diagnosis not present

## 2022-01-24 DIAGNOSIS — E039 Hypothyroidism, unspecified: Secondary | ICD-10-CM | POA: Diagnosis not present

## 2022-01-24 DIAGNOSIS — D518 Other vitamin B12 deficiency anemias: Secondary | ICD-10-CM | POA: Diagnosis not present

## 2022-01-24 DIAGNOSIS — D649 Anemia, unspecified: Secondary | ICD-10-CM | POA: Diagnosis not present

## 2022-01-24 DIAGNOSIS — N390 Urinary tract infection, site not specified: Secondary | ICD-10-CM | POA: Diagnosis not present

## 2022-01-24 DIAGNOSIS — H0288A Meibomian gland dysfunction right eye, upper and lower eyelids: Secondary | ICD-10-CM | POA: Diagnosis not present

## 2022-01-24 DIAGNOSIS — G928 Other toxic encephalopathy: Secondary | ICD-10-CM | POA: Diagnosis not present

## 2022-01-24 DIAGNOSIS — I69398 Other sequelae of cerebral infarction: Secondary | ICD-10-CM | POA: Diagnosis not present

## 2022-01-24 DIAGNOSIS — C50919 Malignant neoplasm of unspecified site of unspecified female breast: Secondary | ICD-10-CM | POA: Diagnosis not present

## 2022-01-24 DIAGNOSIS — E119 Type 2 diabetes mellitus without complications: Secondary | ICD-10-CM | POA: Diagnosis not present

## 2022-01-24 DIAGNOSIS — E559 Vitamin D deficiency, unspecified: Secondary | ICD-10-CM | POA: Diagnosis not present

## 2022-01-24 DIAGNOSIS — J9601 Acute respiratory failure with hypoxia: Secondary | ICD-10-CM | POA: Diagnosis not present

## 2022-01-24 DIAGNOSIS — G459 Transient cerebral ischemic attack, unspecified: Secondary | ICD-10-CM | POA: Diagnosis not present

## 2022-01-24 DIAGNOSIS — I10 Essential (primary) hypertension: Secondary | ICD-10-CM | POA: Diagnosis not present

## 2022-01-24 DIAGNOSIS — H0288B Meibomian gland dysfunction left eye, upper and lower eyelids: Secondary | ICD-10-CM | POA: Diagnosis not present

## 2022-01-24 DIAGNOSIS — E038 Other specified hypothyroidism: Secondary | ICD-10-CM | POA: Diagnosis not present

## 2022-01-24 DIAGNOSIS — I69318 Other symptoms and signs involving cognitive functions following cerebral infarction: Secondary | ICD-10-CM | POA: Diagnosis not present

## 2022-01-24 DIAGNOSIS — B9620 Unspecified Escherichia coli [E. coli] as the cause of diseases classified elsewhere: Secondary | ICD-10-CM | POA: Diagnosis not present

## 2022-01-24 DIAGNOSIS — E782 Mixed hyperlipidemia: Secondary | ICD-10-CM | POA: Diagnosis not present

## 2022-01-25 DIAGNOSIS — I69318 Other symptoms and signs involving cognitive functions following cerebral infarction: Secondary | ICD-10-CM | POA: Diagnosis not present

## 2022-01-25 DIAGNOSIS — N390 Urinary tract infection, site not specified: Secondary | ICD-10-CM | POA: Diagnosis not present

## 2022-01-25 DIAGNOSIS — G928 Other toxic encephalopathy: Secondary | ICD-10-CM | POA: Diagnosis not present

## 2022-01-25 DIAGNOSIS — B9620 Unspecified Escherichia coli [E. coli] as the cause of diseases classified elsewhere: Secondary | ICD-10-CM | POA: Diagnosis not present

## 2022-01-25 DIAGNOSIS — I69398 Other sequelae of cerebral infarction: Secondary | ICD-10-CM | POA: Diagnosis not present

## 2022-01-25 DIAGNOSIS — J9601 Acute respiratory failure with hypoxia: Secondary | ICD-10-CM | POA: Diagnosis not present

## 2022-01-29 DIAGNOSIS — D649 Anemia, unspecified: Secondary | ICD-10-CM | POA: Diagnosis not present

## 2022-01-29 DIAGNOSIS — E559 Vitamin D deficiency, unspecified: Secondary | ICD-10-CM | POA: Diagnosis not present

## 2022-01-29 DIAGNOSIS — E039 Hypothyroidism, unspecified: Secondary | ICD-10-CM | POA: Diagnosis not present

## 2022-01-29 DIAGNOSIS — I10 Essential (primary) hypertension: Secondary | ICD-10-CM | POA: Diagnosis not present

## 2022-01-29 DIAGNOSIS — G459 Transient cerebral ischemic attack, unspecified: Secondary | ICD-10-CM | POA: Diagnosis not present

## 2022-01-29 DIAGNOSIS — R627 Adult failure to thrive: Secondary | ICD-10-CM | POA: Diagnosis not present

## 2022-01-29 DIAGNOSIS — R42 Dizziness and giddiness: Secondary | ICD-10-CM | POA: Diagnosis not present

## 2022-01-30 DIAGNOSIS — J9601 Acute respiratory failure with hypoxia: Secondary | ICD-10-CM | POA: Diagnosis not present

## 2022-01-30 DIAGNOSIS — N390 Urinary tract infection, site not specified: Secondary | ICD-10-CM | POA: Diagnosis not present

## 2022-01-30 DIAGNOSIS — B9620 Unspecified Escherichia coli [E. coli] as the cause of diseases classified elsewhere: Secondary | ICD-10-CM | POA: Diagnosis not present

## 2022-01-30 DIAGNOSIS — G928 Other toxic encephalopathy: Secondary | ICD-10-CM | POA: Diagnosis not present

## 2022-01-30 DIAGNOSIS — I69398 Other sequelae of cerebral infarction: Secondary | ICD-10-CM | POA: Diagnosis not present

## 2022-01-30 DIAGNOSIS — I69318 Other symptoms and signs involving cognitive functions following cerebral infarction: Secondary | ICD-10-CM | POA: Diagnosis not present

## 2022-01-31 DIAGNOSIS — N390 Urinary tract infection, site not specified: Secondary | ICD-10-CM | POA: Diagnosis not present

## 2022-01-31 DIAGNOSIS — J9601 Acute respiratory failure with hypoxia: Secondary | ICD-10-CM | POA: Diagnosis not present

## 2022-01-31 DIAGNOSIS — B9620 Unspecified Escherichia coli [E. coli] as the cause of diseases classified elsewhere: Secondary | ICD-10-CM | POA: Diagnosis not present

## 2022-01-31 DIAGNOSIS — I69398 Other sequelae of cerebral infarction: Secondary | ICD-10-CM | POA: Diagnosis not present

## 2022-01-31 DIAGNOSIS — I69318 Other symptoms and signs involving cognitive functions following cerebral infarction: Secondary | ICD-10-CM | POA: Diagnosis not present

## 2022-01-31 DIAGNOSIS — G928 Other toxic encephalopathy: Secondary | ICD-10-CM | POA: Diagnosis not present

## 2022-02-01 DIAGNOSIS — J9601 Acute respiratory failure with hypoxia: Secondary | ICD-10-CM | POA: Diagnosis not present

## 2022-02-01 DIAGNOSIS — N390 Urinary tract infection, site not specified: Secondary | ICD-10-CM | POA: Diagnosis not present

## 2022-02-01 DIAGNOSIS — I69318 Other symptoms and signs involving cognitive functions following cerebral infarction: Secondary | ICD-10-CM | POA: Diagnosis not present

## 2022-02-01 DIAGNOSIS — B9620 Unspecified Escherichia coli [E. coli] as the cause of diseases classified elsewhere: Secondary | ICD-10-CM | POA: Diagnosis not present

## 2022-02-01 DIAGNOSIS — I69398 Other sequelae of cerebral infarction: Secondary | ICD-10-CM | POA: Diagnosis not present

## 2022-02-01 DIAGNOSIS — G928 Other toxic encephalopathy: Secondary | ICD-10-CM | POA: Diagnosis not present

## 2022-02-06 DIAGNOSIS — J9601 Acute respiratory failure with hypoxia: Secondary | ICD-10-CM | POA: Diagnosis not present

## 2022-02-06 DIAGNOSIS — I69398 Other sequelae of cerebral infarction: Secondary | ICD-10-CM | POA: Diagnosis not present

## 2022-02-06 DIAGNOSIS — I69318 Other symptoms and signs involving cognitive functions following cerebral infarction: Secondary | ICD-10-CM | POA: Diagnosis not present

## 2022-02-06 DIAGNOSIS — B9620 Unspecified Escherichia coli [E. coli] as the cause of diseases classified elsewhere: Secondary | ICD-10-CM | POA: Diagnosis not present

## 2022-02-06 DIAGNOSIS — N390 Urinary tract infection, site not specified: Secondary | ICD-10-CM | POA: Diagnosis not present

## 2022-02-06 DIAGNOSIS — G928 Other toxic encephalopathy: Secondary | ICD-10-CM | POA: Diagnosis not present

## 2022-02-07 DIAGNOSIS — B9620 Unspecified Escherichia coli [E. coli] as the cause of diseases classified elsewhere: Secondary | ICD-10-CM | POA: Diagnosis not present

## 2022-02-07 DIAGNOSIS — B351 Tinea unguium: Secondary | ICD-10-CM | POA: Diagnosis not present

## 2022-02-07 DIAGNOSIS — N390 Urinary tract infection, site not specified: Secondary | ICD-10-CM | POA: Diagnosis not present

## 2022-02-07 DIAGNOSIS — G928 Other toxic encephalopathy: Secondary | ICD-10-CM | POA: Diagnosis not present

## 2022-02-07 DIAGNOSIS — I69398 Other sequelae of cerebral infarction: Secondary | ICD-10-CM | POA: Diagnosis not present

## 2022-02-07 DIAGNOSIS — M79675 Pain in left toe(s): Secondary | ICD-10-CM | POA: Diagnosis not present

## 2022-02-07 DIAGNOSIS — J9601 Acute respiratory failure with hypoxia: Secondary | ICD-10-CM | POA: Diagnosis not present

## 2022-02-07 DIAGNOSIS — M79674 Pain in right toe(s): Secondary | ICD-10-CM | POA: Diagnosis not present

## 2022-02-07 DIAGNOSIS — I69318 Other symptoms and signs involving cognitive functions following cerebral infarction: Secondary | ICD-10-CM | POA: Diagnosis not present

## 2022-02-08 DIAGNOSIS — R32 Unspecified urinary incontinence: Secondary | ICD-10-CM | POA: Diagnosis not present

## 2022-02-08 DIAGNOSIS — B9620 Unspecified Escherichia coli [E. coli] as the cause of diseases classified elsewhere: Secondary | ICD-10-CM | POA: Diagnosis not present

## 2022-02-08 DIAGNOSIS — J9611 Chronic respiratory failure with hypoxia: Secondary | ICD-10-CM | POA: Diagnosis not present

## 2022-02-08 DIAGNOSIS — H1132 Conjunctival hemorrhage, left eye: Secondary | ICD-10-CM | POA: Diagnosis not present

## 2022-02-08 DIAGNOSIS — D709 Neutropenia, unspecified: Secondary | ICD-10-CM | POA: Diagnosis not present

## 2022-02-08 DIAGNOSIS — C50912 Malignant neoplasm of unspecified site of left female breast: Secondary | ICD-10-CM | POA: Diagnosis not present

## 2022-02-08 DIAGNOSIS — Z6827 Body mass index (BMI) 27.0-27.9, adult: Secondary | ICD-10-CM | POA: Diagnosis not present

## 2022-02-08 DIAGNOSIS — I10 Essential (primary) hypertension: Secondary | ICD-10-CM | POA: Diagnosis not present

## 2022-02-08 DIAGNOSIS — E785 Hyperlipidemia, unspecified: Secondary | ICD-10-CM | POA: Diagnosis not present

## 2022-02-08 DIAGNOSIS — E43 Unspecified severe protein-calorie malnutrition: Secondary | ICD-10-CM | POA: Diagnosis not present

## 2022-02-08 DIAGNOSIS — D696 Thrombocytopenia, unspecified: Secondary | ICD-10-CM | POA: Diagnosis not present

## 2022-02-08 DIAGNOSIS — M5000 Cervical disc disorder with myelopathy, unspecified cervical region: Secondary | ICD-10-CM | POA: Diagnosis not present

## 2022-02-08 DIAGNOSIS — Z9981 Dependence on supplemental oxygen: Secondary | ICD-10-CM | POA: Diagnosis not present

## 2022-02-08 DIAGNOSIS — F32A Depression, unspecified: Secondary | ICD-10-CM | POA: Diagnosis not present

## 2022-02-08 DIAGNOSIS — E039 Hypothyroidism, unspecified: Secondary | ICD-10-CM | POA: Diagnosis not present

## 2022-02-08 DIAGNOSIS — E119 Type 2 diabetes mellitus without complications: Secondary | ICD-10-CM | POA: Diagnosis not present

## 2022-02-08 DIAGNOSIS — Z8616 Personal history of COVID-19: Secondary | ICD-10-CM | POA: Diagnosis not present

## 2022-02-08 DIAGNOSIS — Z741 Need for assistance with personal care: Secondary | ICD-10-CM | POA: Diagnosis not present

## 2022-02-08 DIAGNOSIS — N390 Urinary tract infection, site not specified: Secondary | ICD-10-CM | POA: Diagnosis not present

## 2022-02-08 DIAGNOSIS — K5900 Constipation, unspecified: Secondary | ICD-10-CM | POA: Diagnosis not present

## 2022-02-08 DIAGNOSIS — D649 Anemia, unspecified: Secondary | ICD-10-CM | POA: Diagnosis not present

## 2022-02-08 DIAGNOSIS — R159 Full incontinence of feces: Secondary | ICD-10-CM | POA: Diagnosis not present

## 2022-02-08 DIAGNOSIS — I69318 Other symptoms and signs involving cognitive functions following cerebral infarction: Secondary | ICD-10-CM | POA: Diagnosis not present

## 2022-02-08 DIAGNOSIS — G40909 Epilepsy, unspecified, not intractable, without status epilepticus: Secondary | ICD-10-CM | POA: Diagnosis not present

## 2022-02-08 DIAGNOSIS — G928 Other toxic encephalopathy: Secondary | ICD-10-CM | POA: Diagnosis not present

## 2022-02-08 DIAGNOSIS — I69398 Other sequelae of cerebral infarction: Secondary | ICD-10-CM | POA: Diagnosis not present

## 2022-02-13 DIAGNOSIS — I69318 Other symptoms and signs involving cognitive functions following cerebral infarction: Secondary | ICD-10-CM | POA: Diagnosis not present

## 2022-02-13 DIAGNOSIS — N390 Urinary tract infection, site not specified: Secondary | ICD-10-CM | POA: Diagnosis not present

## 2022-02-13 DIAGNOSIS — I69398 Other sequelae of cerebral infarction: Secondary | ICD-10-CM | POA: Diagnosis not present

## 2022-02-13 DIAGNOSIS — B9620 Unspecified Escherichia coli [E. coli] as the cause of diseases classified elsewhere: Secondary | ICD-10-CM | POA: Diagnosis not present

## 2022-02-13 DIAGNOSIS — J9611 Chronic respiratory failure with hypoxia: Secondary | ICD-10-CM | POA: Diagnosis not present

## 2022-02-13 DIAGNOSIS — G928 Other toxic encephalopathy: Secondary | ICD-10-CM | POA: Diagnosis not present

## 2022-02-14 DIAGNOSIS — J9611 Chronic respiratory failure with hypoxia: Secondary | ICD-10-CM | POA: Diagnosis not present

## 2022-02-14 DIAGNOSIS — G928 Other toxic encephalopathy: Secondary | ICD-10-CM | POA: Diagnosis not present

## 2022-02-14 DIAGNOSIS — N390 Urinary tract infection, site not specified: Secondary | ICD-10-CM | POA: Diagnosis not present

## 2022-02-14 DIAGNOSIS — I69318 Other symptoms and signs involving cognitive functions following cerebral infarction: Secondary | ICD-10-CM | POA: Diagnosis not present

## 2022-02-14 DIAGNOSIS — B9620 Unspecified Escherichia coli [E. coli] as the cause of diseases classified elsewhere: Secondary | ICD-10-CM | POA: Diagnosis not present

## 2022-02-14 DIAGNOSIS — I69398 Other sequelae of cerebral infarction: Secondary | ICD-10-CM | POA: Diagnosis not present

## 2022-02-15 DIAGNOSIS — I69398 Other sequelae of cerebral infarction: Secondary | ICD-10-CM | POA: Diagnosis not present

## 2022-02-15 DIAGNOSIS — J9611 Chronic respiratory failure with hypoxia: Secondary | ICD-10-CM | POA: Diagnosis not present

## 2022-02-15 DIAGNOSIS — B9620 Unspecified Escherichia coli [E. coli] as the cause of diseases classified elsewhere: Secondary | ICD-10-CM | POA: Diagnosis not present

## 2022-02-15 DIAGNOSIS — G928 Other toxic encephalopathy: Secondary | ICD-10-CM | POA: Diagnosis not present

## 2022-02-15 DIAGNOSIS — I69318 Other symptoms and signs involving cognitive functions following cerebral infarction: Secondary | ICD-10-CM | POA: Diagnosis not present

## 2022-02-15 DIAGNOSIS — N390 Urinary tract infection, site not specified: Secondary | ICD-10-CM | POA: Diagnosis not present

## 2022-02-16 DIAGNOSIS — J9611 Chronic respiratory failure with hypoxia: Secondary | ICD-10-CM | POA: Diagnosis not present

## 2022-02-16 DIAGNOSIS — B9620 Unspecified Escherichia coli [E. coli] as the cause of diseases classified elsewhere: Secondary | ICD-10-CM | POA: Diagnosis not present

## 2022-02-16 DIAGNOSIS — N390 Urinary tract infection, site not specified: Secondary | ICD-10-CM | POA: Diagnosis not present

## 2022-02-16 DIAGNOSIS — I69398 Other sequelae of cerebral infarction: Secondary | ICD-10-CM | POA: Diagnosis not present

## 2022-02-16 DIAGNOSIS — I69318 Other symptoms and signs involving cognitive functions following cerebral infarction: Secondary | ICD-10-CM | POA: Diagnosis not present

## 2022-02-16 DIAGNOSIS — G928 Other toxic encephalopathy: Secondary | ICD-10-CM | POA: Diagnosis not present

## 2022-02-17 DIAGNOSIS — D649 Anemia, unspecified: Secondary | ICD-10-CM | POA: Diagnosis not present

## 2022-02-17 DIAGNOSIS — E039 Hypothyroidism, unspecified: Secondary | ICD-10-CM | POA: Diagnosis not present

## 2022-02-17 DIAGNOSIS — I447 Left bundle-branch block, unspecified: Secondary | ICD-10-CM | POA: Diagnosis not present

## 2022-02-17 DIAGNOSIS — G459 Transient cerebral ischemic attack, unspecified: Secondary | ICD-10-CM | POA: Diagnosis not present

## 2022-02-17 DIAGNOSIS — E119 Type 2 diabetes mellitus without complications: Secondary | ICD-10-CM | POA: Diagnosis not present

## 2022-02-17 DIAGNOSIS — Z0000 Encounter for general adult medical examination without abnormal findings: Secondary | ICD-10-CM | POA: Diagnosis not present

## 2022-02-17 DIAGNOSIS — R627 Adult failure to thrive: Secondary | ICD-10-CM | POA: Diagnosis not present

## 2022-02-19 DIAGNOSIS — E039 Hypothyroidism, unspecified: Secondary | ICD-10-CM | POA: Diagnosis not present

## 2022-02-19 DIAGNOSIS — I10 Essential (primary) hypertension: Secondary | ICD-10-CM | POA: Diagnosis not present

## 2022-02-19 DIAGNOSIS — E782 Mixed hyperlipidemia: Secondary | ICD-10-CM | POA: Diagnosis not present

## 2022-02-19 DIAGNOSIS — G459 Transient cerebral ischemic attack, unspecified: Secondary | ICD-10-CM | POA: Diagnosis not present

## 2022-02-19 DIAGNOSIS — D649 Anemia, unspecified: Secondary | ICD-10-CM | POA: Diagnosis not present

## 2022-02-19 DIAGNOSIS — E559 Vitamin D deficiency, unspecified: Secondary | ICD-10-CM | POA: Diagnosis not present

## 2022-02-19 DIAGNOSIS — E119 Type 2 diabetes mellitus without complications: Secondary | ICD-10-CM | POA: Diagnosis not present

## 2022-02-19 DIAGNOSIS — C50919 Malignant neoplasm of unspecified site of unspecified female breast: Secondary | ICD-10-CM | POA: Diagnosis not present

## 2022-02-19 DIAGNOSIS — E038 Other specified hypothyroidism: Secondary | ICD-10-CM | POA: Diagnosis not present

## 2022-02-19 DIAGNOSIS — D518 Other vitamin B12 deficiency anemias: Secondary | ICD-10-CM | POA: Diagnosis not present

## 2022-02-20 DIAGNOSIS — J9611 Chronic respiratory failure with hypoxia: Secondary | ICD-10-CM | POA: Diagnosis not present

## 2022-02-20 DIAGNOSIS — G928 Other toxic encephalopathy: Secondary | ICD-10-CM | POA: Diagnosis not present

## 2022-02-20 DIAGNOSIS — B9620 Unspecified Escherichia coli [E. coli] as the cause of diseases classified elsewhere: Secondary | ICD-10-CM | POA: Diagnosis not present

## 2022-02-20 DIAGNOSIS — N390 Urinary tract infection, site not specified: Secondary | ICD-10-CM | POA: Diagnosis not present

## 2022-02-20 DIAGNOSIS — I69398 Other sequelae of cerebral infarction: Secondary | ICD-10-CM | POA: Diagnosis not present

## 2022-02-20 DIAGNOSIS — I69318 Other symptoms and signs involving cognitive functions following cerebral infarction: Secondary | ICD-10-CM | POA: Diagnosis not present

## 2022-02-21 DIAGNOSIS — N390 Urinary tract infection, site not specified: Secondary | ICD-10-CM | POA: Diagnosis not present

## 2022-02-21 DIAGNOSIS — I69318 Other symptoms and signs involving cognitive functions following cerebral infarction: Secondary | ICD-10-CM | POA: Diagnosis not present

## 2022-02-21 DIAGNOSIS — B9620 Unspecified Escherichia coli [E. coli] as the cause of diseases classified elsewhere: Secondary | ICD-10-CM | POA: Diagnosis not present

## 2022-02-21 DIAGNOSIS — I69398 Other sequelae of cerebral infarction: Secondary | ICD-10-CM | POA: Diagnosis not present

## 2022-02-21 DIAGNOSIS — G928 Other toxic encephalopathy: Secondary | ICD-10-CM | POA: Diagnosis not present

## 2022-02-21 DIAGNOSIS — J9611 Chronic respiratory failure with hypoxia: Secondary | ICD-10-CM | POA: Diagnosis not present

## 2022-02-22 DIAGNOSIS — I69318 Other symptoms and signs involving cognitive functions following cerebral infarction: Secondary | ICD-10-CM | POA: Diagnosis not present

## 2022-02-22 DIAGNOSIS — I69398 Other sequelae of cerebral infarction: Secondary | ICD-10-CM | POA: Diagnosis not present

## 2022-02-22 DIAGNOSIS — J9611 Chronic respiratory failure with hypoxia: Secondary | ICD-10-CM | POA: Diagnosis not present

## 2022-02-22 DIAGNOSIS — B9620 Unspecified Escherichia coli [E. coli] as the cause of diseases classified elsewhere: Secondary | ICD-10-CM | POA: Diagnosis not present

## 2022-02-22 DIAGNOSIS — N390 Urinary tract infection, site not specified: Secondary | ICD-10-CM | POA: Diagnosis not present

## 2022-02-22 DIAGNOSIS — G928 Other toxic encephalopathy: Secondary | ICD-10-CM | POA: Diagnosis not present

## 2022-02-26 DIAGNOSIS — I447 Left bundle-branch block, unspecified: Secondary | ICD-10-CM | POA: Diagnosis not present

## 2022-02-26 DIAGNOSIS — I10 Essential (primary) hypertension: Secondary | ICD-10-CM | POA: Diagnosis not present

## 2022-02-26 DIAGNOSIS — E119 Type 2 diabetes mellitus without complications: Secondary | ICD-10-CM | POA: Diagnosis not present

## 2022-02-26 DIAGNOSIS — E559 Vitamin D deficiency, unspecified: Secondary | ICD-10-CM | POA: Diagnosis not present

## 2022-02-26 DIAGNOSIS — D649 Anemia, unspecified: Secondary | ICD-10-CM | POA: Diagnosis not present

## 2022-02-26 DIAGNOSIS — E039 Hypothyroidism, unspecified: Secondary | ICD-10-CM | POA: Diagnosis not present

## 2022-02-26 DIAGNOSIS — R42 Dizziness and giddiness: Secondary | ICD-10-CM | POA: Diagnosis not present

## 2022-02-27 DIAGNOSIS — B9620 Unspecified Escherichia coli [E. coli] as the cause of diseases classified elsewhere: Secondary | ICD-10-CM | POA: Diagnosis not present

## 2022-02-27 DIAGNOSIS — I69398 Other sequelae of cerebral infarction: Secondary | ICD-10-CM | POA: Diagnosis not present

## 2022-02-27 DIAGNOSIS — J9611 Chronic respiratory failure with hypoxia: Secondary | ICD-10-CM | POA: Diagnosis not present

## 2022-02-27 DIAGNOSIS — N390 Urinary tract infection, site not specified: Secondary | ICD-10-CM | POA: Diagnosis not present

## 2022-02-27 DIAGNOSIS — I69318 Other symptoms and signs involving cognitive functions following cerebral infarction: Secondary | ICD-10-CM | POA: Diagnosis not present

## 2022-02-27 DIAGNOSIS — G928 Other toxic encephalopathy: Secondary | ICD-10-CM | POA: Diagnosis not present

## 2022-03-01 DIAGNOSIS — I69318 Other symptoms and signs involving cognitive functions following cerebral infarction: Secondary | ICD-10-CM | POA: Diagnosis not present

## 2022-03-01 DIAGNOSIS — J9611 Chronic respiratory failure with hypoxia: Secondary | ICD-10-CM | POA: Diagnosis not present

## 2022-03-01 DIAGNOSIS — B9620 Unspecified Escherichia coli [E. coli] as the cause of diseases classified elsewhere: Secondary | ICD-10-CM | POA: Diagnosis not present

## 2022-03-01 DIAGNOSIS — G928 Other toxic encephalopathy: Secondary | ICD-10-CM | POA: Diagnosis not present

## 2022-03-01 DIAGNOSIS — N390 Urinary tract infection, site not specified: Secondary | ICD-10-CM | POA: Diagnosis not present

## 2022-03-01 DIAGNOSIS — I69398 Other sequelae of cerebral infarction: Secondary | ICD-10-CM | POA: Diagnosis not present

## 2022-03-02 DIAGNOSIS — J9611 Chronic respiratory failure with hypoxia: Secondary | ICD-10-CM | POA: Diagnosis not present

## 2022-03-02 DIAGNOSIS — N390 Urinary tract infection, site not specified: Secondary | ICD-10-CM | POA: Diagnosis not present

## 2022-03-02 DIAGNOSIS — I69398 Other sequelae of cerebral infarction: Secondary | ICD-10-CM | POA: Diagnosis not present

## 2022-03-02 DIAGNOSIS — I69318 Other symptoms and signs involving cognitive functions following cerebral infarction: Secondary | ICD-10-CM | POA: Diagnosis not present

## 2022-03-02 DIAGNOSIS — G928 Other toxic encephalopathy: Secondary | ICD-10-CM | POA: Diagnosis not present

## 2022-03-02 DIAGNOSIS — B9620 Unspecified Escherichia coli [E. coli] as the cause of diseases classified elsewhere: Secondary | ICD-10-CM | POA: Diagnosis not present

## 2022-03-06 DIAGNOSIS — B9620 Unspecified Escherichia coli [E. coli] as the cause of diseases classified elsewhere: Secondary | ICD-10-CM | POA: Diagnosis not present

## 2022-03-06 DIAGNOSIS — J9611 Chronic respiratory failure with hypoxia: Secondary | ICD-10-CM | POA: Diagnosis not present

## 2022-03-06 DIAGNOSIS — I69318 Other symptoms and signs involving cognitive functions following cerebral infarction: Secondary | ICD-10-CM | POA: Diagnosis not present

## 2022-03-06 DIAGNOSIS — I69398 Other sequelae of cerebral infarction: Secondary | ICD-10-CM | POA: Diagnosis not present

## 2022-03-06 DIAGNOSIS — G928 Other toxic encephalopathy: Secondary | ICD-10-CM | POA: Diagnosis not present

## 2022-03-06 DIAGNOSIS — N390 Urinary tract infection, site not specified: Secondary | ICD-10-CM | POA: Diagnosis not present

## 2022-03-07 DIAGNOSIS — I69318 Other symptoms and signs involving cognitive functions following cerebral infarction: Secondary | ICD-10-CM | POA: Diagnosis not present

## 2022-03-07 DIAGNOSIS — G928 Other toxic encephalopathy: Secondary | ICD-10-CM | POA: Diagnosis not present

## 2022-03-07 DIAGNOSIS — N390 Urinary tract infection, site not specified: Secondary | ICD-10-CM | POA: Diagnosis not present

## 2022-03-07 DIAGNOSIS — I10 Essential (primary) hypertension: Secondary | ICD-10-CM | POA: Diagnosis not present

## 2022-03-07 DIAGNOSIS — J9611 Chronic respiratory failure with hypoxia: Secondary | ICD-10-CM | POA: Diagnosis not present

## 2022-03-07 DIAGNOSIS — I69398 Other sequelae of cerebral infarction: Secondary | ICD-10-CM | POA: Diagnosis not present

## 2022-03-07 DIAGNOSIS — B9620 Unspecified Escherichia coli [E. coli] as the cause of diseases classified elsewhere: Secondary | ICD-10-CM | POA: Diagnosis not present

## 2022-03-08 DIAGNOSIS — B9620 Unspecified Escherichia coli [E. coli] as the cause of diseases classified elsewhere: Secondary | ICD-10-CM | POA: Diagnosis not present

## 2022-03-08 DIAGNOSIS — I69398 Other sequelae of cerebral infarction: Secondary | ICD-10-CM | POA: Diagnosis not present

## 2022-03-08 DIAGNOSIS — G928 Other toxic encephalopathy: Secondary | ICD-10-CM | POA: Diagnosis not present

## 2022-03-08 DIAGNOSIS — I69318 Other symptoms and signs involving cognitive functions following cerebral infarction: Secondary | ICD-10-CM | POA: Diagnosis not present

## 2022-03-08 DIAGNOSIS — N390 Urinary tract infection, site not specified: Secondary | ICD-10-CM | POA: Diagnosis not present

## 2022-03-08 DIAGNOSIS — J9611 Chronic respiratory failure with hypoxia: Secondary | ICD-10-CM | POA: Diagnosis not present

## 2022-03-10 DIAGNOSIS — E039 Hypothyroidism, unspecified: Secondary | ICD-10-CM | POA: Diagnosis not present

## 2022-03-10 DIAGNOSIS — J9611 Chronic respiratory failure with hypoxia: Secondary | ICD-10-CM | POA: Diagnosis not present

## 2022-03-10 DIAGNOSIS — H1132 Conjunctival hemorrhage, left eye: Secondary | ICD-10-CM | POA: Diagnosis not present

## 2022-03-10 DIAGNOSIS — E119 Type 2 diabetes mellitus without complications: Secondary | ICD-10-CM | POA: Diagnosis not present

## 2022-03-10 DIAGNOSIS — Z8616 Personal history of COVID-19: Secondary | ICD-10-CM | POA: Diagnosis not present

## 2022-03-10 DIAGNOSIS — D696 Thrombocytopenia, unspecified: Secondary | ICD-10-CM | POA: Diagnosis not present

## 2022-03-10 DIAGNOSIS — D649 Anemia, unspecified: Secondary | ICD-10-CM | POA: Diagnosis not present

## 2022-03-10 DIAGNOSIS — M5000 Cervical disc disorder with myelopathy, unspecified cervical region: Secondary | ICD-10-CM | POA: Diagnosis not present

## 2022-03-10 DIAGNOSIS — R159 Full incontinence of feces: Secondary | ICD-10-CM | POA: Diagnosis not present

## 2022-03-10 DIAGNOSIS — E43 Unspecified severe protein-calorie malnutrition: Secondary | ICD-10-CM | POA: Diagnosis not present

## 2022-03-10 DIAGNOSIS — C50912 Malignant neoplasm of unspecified site of left female breast: Secondary | ICD-10-CM | POA: Diagnosis not present

## 2022-03-10 DIAGNOSIS — Z741 Need for assistance with personal care: Secondary | ICD-10-CM | POA: Diagnosis not present

## 2022-03-10 DIAGNOSIS — F32A Depression, unspecified: Secondary | ICD-10-CM | POA: Diagnosis not present

## 2022-03-10 DIAGNOSIS — G928 Other toxic encephalopathy: Secondary | ICD-10-CM | POA: Diagnosis not present

## 2022-03-10 DIAGNOSIS — I69398 Other sequelae of cerebral infarction: Secondary | ICD-10-CM | POA: Diagnosis not present

## 2022-03-10 DIAGNOSIS — E785 Hyperlipidemia, unspecified: Secondary | ICD-10-CM | POA: Diagnosis not present

## 2022-03-10 DIAGNOSIS — G40909 Epilepsy, unspecified, not intractable, without status epilepticus: Secondary | ICD-10-CM | POA: Diagnosis not present

## 2022-03-10 DIAGNOSIS — N390 Urinary tract infection, site not specified: Secondary | ICD-10-CM | POA: Diagnosis not present

## 2022-03-10 DIAGNOSIS — D709 Neutropenia, unspecified: Secondary | ICD-10-CM | POA: Diagnosis not present

## 2022-03-10 DIAGNOSIS — Z6827 Body mass index (BMI) 27.0-27.9, adult: Secondary | ICD-10-CM | POA: Diagnosis not present

## 2022-03-10 DIAGNOSIS — R32 Unspecified urinary incontinence: Secondary | ICD-10-CM | POA: Diagnosis not present

## 2022-03-10 DIAGNOSIS — I10 Essential (primary) hypertension: Secondary | ICD-10-CM | POA: Diagnosis not present

## 2022-03-10 DIAGNOSIS — Z9981 Dependence on supplemental oxygen: Secondary | ICD-10-CM | POA: Diagnosis not present

## 2022-03-10 DIAGNOSIS — B9620 Unspecified Escherichia coli [E. coli] as the cause of diseases classified elsewhere: Secondary | ICD-10-CM | POA: Diagnosis not present

## 2022-03-10 DIAGNOSIS — I69318 Other symptoms and signs involving cognitive functions following cerebral infarction: Secondary | ICD-10-CM | POA: Diagnosis not present

## 2022-03-13 DIAGNOSIS — I69318 Other symptoms and signs involving cognitive functions following cerebral infarction: Secondary | ICD-10-CM | POA: Diagnosis not present

## 2022-03-13 DIAGNOSIS — J9611 Chronic respiratory failure with hypoxia: Secondary | ICD-10-CM | POA: Diagnosis not present

## 2022-03-13 DIAGNOSIS — N390 Urinary tract infection, site not specified: Secondary | ICD-10-CM | POA: Diagnosis not present

## 2022-03-13 DIAGNOSIS — I69398 Other sequelae of cerebral infarction: Secondary | ICD-10-CM | POA: Diagnosis not present

## 2022-03-13 DIAGNOSIS — B9620 Unspecified Escherichia coli [E. coli] as the cause of diseases classified elsewhere: Secondary | ICD-10-CM | POA: Diagnosis not present

## 2022-03-13 DIAGNOSIS — G928 Other toxic encephalopathy: Secondary | ICD-10-CM | POA: Diagnosis not present

## 2022-03-14 DIAGNOSIS — G928 Other toxic encephalopathy: Secondary | ICD-10-CM | POA: Diagnosis not present

## 2022-03-14 DIAGNOSIS — I69318 Other symptoms and signs involving cognitive functions following cerebral infarction: Secondary | ICD-10-CM | POA: Diagnosis not present

## 2022-03-14 DIAGNOSIS — I69398 Other sequelae of cerebral infarction: Secondary | ICD-10-CM | POA: Diagnosis not present

## 2022-03-14 DIAGNOSIS — J9611 Chronic respiratory failure with hypoxia: Secondary | ICD-10-CM | POA: Diagnosis not present

## 2022-03-14 DIAGNOSIS — B9620 Unspecified Escherichia coli [E. coli] as the cause of diseases classified elsewhere: Secondary | ICD-10-CM | POA: Diagnosis not present

## 2022-03-14 DIAGNOSIS — N390 Urinary tract infection, site not specified: Secondary | ICD-10-CM | POA: Diagnosis not present

## 2022-03-15 DIAGNOSIS — J9611 Chronic respiratory failure with hypoxia: Secondary | ICD-10-CM | POA: Diagnosis not present

## 2022-03-15 DIAGNOSIS — B9620 Unspecified Escherichia coli [E. coli] as the cause of diseases classified elsewhere: Secondary | ICD-10-CM | POA: Diagnosis not present

## 2022-03-15 DIAGNOSIS — I69398 Other sequelae of cerebral infarction: Secondary | ICD-10-CM | POA: Diagnosis not present

## 2022-03-15 DIAGNOSIS — G928 Other toxic encephalopathy: Secondary | ICD-10-CM | POA: Diagnosis not present

## 2022-03-15 DIAGNOSIS — N390 Urinary tract infection, site not specified: Secondary | ICD-10-CM | POA: Diagnosis not present

## 2022-03-15 DIAGNOSIS — I69318 Other symptoms and signs involving cognitive functions following cerebral infarction: Secondary | ICD-10-CM | POA: Diagnosis not present

## 2022-03-20 DIAGNOSIS — I69398 Other sequelae of cerebral infarction: Secondary | ICD-10-CM | POA: Diagnosis not present

## 2022-03-20 DIAGNOSIS — J9611 Chronic respiratory failure with hypoxia: Secondary | ICD-10-CM | POA: Diagnosis not present

## 2022-03-20 DIAGNOSIS — N390 Urinary tract infection, site not specified: Secondary | ICD-10-CM | POA: Diagnosis not present

## 2022-03-20 DIAGNOSIS — G928 Other toxic encephalopathy: Secondary | ICD-10-CM | POA: Diagnosis not present

## 2022-03-20 DIAGNOSIS — I69318 Other symptoms and signs involving cognitive functions following cerebral infarction: Secondary | ICD-10-CM | POA: Diagnosis not present

## 2022-03-20 DIAGNOSIS — B9620 Unspecified Escherichia coli [E. coli] as the cause of diseases classified elsewhere: Secondary | ICD-10-CM | POA: Diagnosis not present

## 2022-03-21 DIAGNOSIS — N390 Urinary tract infection, site not specified: Secondary | ICD-10-CM | POA: Diagnosis not present

## 2022-03-21 DIAGNOSIS — J9611 Chronic respiratory failure with hypoxia: Secondary | ICD-10-CM | POA: Diagnosis not present

## 2022-03-21 DIAGNOSIS — G928 Other toxic encephalopathy: Secondary | ICD-10-CM | POA: Diagnosis not present

## 2022-03-21 DIAGNOSIS — I69398 Other sequelae of cerebral infarction: Secondary | ICD-10-CM | POA: Diagnosis not present

## 2022-03-21 DIAGNOSIS — B9620 Unspecified Escherichia coli [E. coli] as the cause of diseases classified elsewhere: Secondary | ICD-10-CM | POA: Diagnosis not present

## 2022-03-21 DIAGNOSIS — I69318 Other symptoms and signs involving cognitive functions following cerebral infarction: Secondary | ICD-10-CM | POA: Diagnosis not present

## 2022-03-22 DIAGNOSIS — N390 Urinary tract infection, site not specified: Secondary | ICD-10-CM | POA: Diagnosis not present

## 2022-03-22 DIAGNOSIS — E119 Type 2 diabetes mellitus without complications: Secondary | ICD-10-CM | POA: Diagnosis not present

## 2022-03-22 DIAGNOSIS — J9611 Chronic respiratory failure with hypoxia: Secondary | ICD-10-CM | POA: Diagnosis not present

## 2022-03-22 DIAGNOSIS — I69398 Other sequelae of cerebral infarction: Secondary | ICD-10-CM | POA: Diagnosis not present

## 2022-03-22 DIAGNOSIS — E038 Other specified hypothyroidism: Secondary | ICD-10-CM | POA: Diagnosis not present

## 2022-03-22 DIAGNOSIS — I10 Essential (primary) hypertension: Secondary | ICD-10-CM | POA: Diagnosis not present

## 2022-03-22 DIAGNOSIS — I69318 Other symptoms and signs involving cognitive functions following cerebral infarction: Secondary | ICD-10-CM | POA: Diagnosis not present

## 2022-03-22 DIAGNOSIS — D518 Other vitamin B12 deficiency anemias: Secondary | ICD-10-CM | POA: Diagnosis not present

## 2022-03-22 DIAGNOSIS — E782 Mixed hyperlipidemia: Secondary | ICD-10-CM | POA: Diagnosis not present

## 2022-03-22 DIAGNOSIS — E559 Vitamin D deficiency, unspecified: Secondary | ICD-10-CM | POA: Diagnosis not present

## 2022-03-22 DIAGNOSIS — B9620 Unspecified Escherichia coli [E. coli] as the cause of diseases classified elsewhere: Secondary | ICD-10-CM | POA: Diagnosis not present

## 2022-03-22 DIAGNOSIS — G928 Other toxic encephalopathy: Secondary | ICD-10-CM | POA: Diagnosis not present

## 2022-03-26 DIAGNOSIS — G459 Transient cerebral ischemic attack, unspecified: Secondary | ICD-10-CM | POA: Diagnosis not present

## 2022-03-26 DIAGNOSIS — R627 Adult failure to thrive: Secondary | ICD-10-CM | POA: Diagnosis not present

## 2022-03-26 DIAGNOSIS — E119 Type 2 diabetes mellitus without complications: Secondary | ICD-10-CM | POA: Diagnosis not present

## 2022-03-26 DIAGNOSIS — I10 Essential (primary) hypertension: Secondary | ICD-10-CM | POA: Diagnosis not present

## 2022-03-26 DIAGNOSIS — E039 Hypothyroidism, unspecified: Secondary | ICD-10-CM | POA: Diagnosis not present

## 2022-03-27 DIAGNOSIS — N390 Urinary tract infection, site not specified: Secondary | ICD-10-CM | POA: Diagnosis not present

## 2022-03-27 DIAGNOSIS — I69398 Other sequelae of cerebral infarction: Secondary | ICD-10-CM | POA: Diagnosis not present

## 2022-03-27 DIAGNOSIS — I69318 Other symptoms and signs involving cognitive functions following cerebral infarction: Secondary | ICD-10-CM | POA: Diagnosis not present

## 2022-03-27 DIAGNOSIS — J9611 Chronic respiratory failure with hypoxia: Secondary | ICD-10-CM | POA: Diagnosis not present

## 2022-03-27 DIAGNOSIS — G928 Other toxic encephalopathy: Secondary | ICD-10-CM | POA: Diagnosis not present

## 2022-03-27 DIAGNOSIS — B9620 Unspecified Escherichia coli [E. coli] as the cause of diseases classified elsewhere: Secondary | ICD-10-CM | POA: Diagnosis not present

## 2022-03-28 DIAGNOSIS — I69318 Other symptoms and signs involving cognitive functions following cerebral infarction: Secondary | ICD-10-CM | POA: Diagnosis not present

## 2022-03-28 DIAGNOSIS — G928 Other toxic encephalopathy: Secondary | ICD-10-CM | POA: Diagnosis not present

## 2022-03-28 DIAGNOSIS — N390 Urinary tract infection, site not specified: Secondary | ICD-10-CM | POA: Diagnosis not present

## 2022-03-28 DIAGNOSIS — J9611 Chronic respiratory failure with hypoxia: Secondary | ICD-10-CM | POA: Diagnosis not present

## 2022-03-28 DIAGNOSIS — I69398 Other sequelae of cerebral infarction: Secondary | ICD-10-CM | POA: Diagnosis not present

## 2022-03-28 DIAGNOSIS — B9620 Unspecified Escherichia coli [E. coli] as the cause of diseases classified elsewhere: Secondary | ICD-10-CM | POA: Diagnosis not present

## 2022-03-29 DIAGNOSIS — N390 Urinary tract infection, site not specified: Secondary | ICD-10-CM | POA: Diagnosis not present

## 2022-03-29 DIAGNOSIS — G928 Other toxic encephalopathy: Secondary | ICD-10-CM | POA: Diagnosis not present

## 2022-03-29 DIAGNOSIS — I69318 Other symptoms and signs involving cognitive functions following cerebral infarction: Secondary | ICD-10-CM | POA: Diagnosis not present

## 2022-03-29 DIAGNOSIS — J9611 Chronic respiratory failure with hypoxia: Secondary | ICD-10-CM | POA: Diagnosis not present

## 2022-03-29 DIAGNOSIS — I69398 Other sequelae of cerebral infarction: Secondary | ICD-10-CM | POA: Diagnosis not present

## 2022-03-29 DIAGNOSIS — B9620 Unspecified Escherichia coli [E. coli] as the cause of diseases classified elsewhere: Secondary | ICD-10-CM | POA: Diagnosis not present

## 2022-04-03 DIAGNOSIS — I69398 Other sequelae of cerebral infarction: Secondary | ICD-10-CM | POA: Diagnosis not present

## 2022-04-03 DIAGNOSIS — N390 Urinary tract infection, site not specified: Secondary | ICD-10-CM | POA: Diagnosis not present

## 2022-04-03 DIAGNOSIS — B9620 Unspecified Escherichia coli [E. coli] as the cause of diseases classified elsewhere: Secondary | ICD-10-CM | POA: Diagnosis not present

## 2022-04-03 DIAGNOSIS — I69318 Other symptoms and signs involving cognitive functions following cerebral infarction: Secondary | ICD-10-CM | POA: Diagnosis not present

## 2022-04-03 DIAGNOSIS — G928 Other toxic encephalopathy: Secondary | ICD-10-CM | POA: Diagnosis not present

## 2022-04-03 DIAGNOSIS — J9611 Chronic respiratory failure with hypoxia: Secondary | ICD-10-CM | POA: Diagnosis not present

## 2022-04-04 DIAGNOSIS — I69398 Other sequelae of cerebral infarction: Secondary | ICD-10-CM | POA: Diagnosis not present

## 2022-04-04 DIAGNOSIS — I69318 Other symptoms and signs involving cognitive functions following cerebral infarction: Secondary | ICD-10-CM | POA: Diagnosis not present

## 2022-04-04 DIAGNOSIS — J9611 Chronic respiratory failure with hypoxia: Secondary | ICD-10-CM | POA: Diagnosis not present

## 2022-04-04 DIAGNOSIS — B9620 Unspecified Escherichia coli [E. coli] as the cause of diseases classified elsewhere: Secondary | ICD-10-CM | POA: Diagnosis not present

## 2022-04-04 DIAGNOSIS — N390 Urinary tract infection, site not specified: Secondary | ICD-10-CM | POA: Diagnosis not present

## 2022-04-04 DIAGNOSIS — G928 Other toxic encephalopathy: Secondary | ICD-10-CM | POA: Diagnosis not present

## 2022-04-05 DIAGNOSIS — N390 Urinary tract infection, site not specified: Secondary | ICD-10-CM | POA: Diagnosis not present

## 2022-04-05 DIAGNOSIS — B9620 Unspecified Escherichia coli [E. coli] as the cause of diseases classified elsewhere: Secondary | ICD-10-CM | POA: Diagnosis not present

## 2022-04-05 DIAGNOSIS — I69398 Other sequelae of cerebral infarction: Secondary | ICD-10-CM | POA: Diagnosis not present

## 2022-04-05 DIAGNOSIS — G928 Other toxic encephalopathy: Secondary | ICD-10-CM | POA: Diagnosis not present

## 2022-04-05 DIAGNOSIS — J9611 Chronic respiratory failure with hypoxia: Secondary | ICD-10-CM | POA: Diagnosis not present

## 2022-04-05 DIAGNOSIS — I69318 Other symptoms and signs involving cognitive functions following cerebral infarction: Secondary | ICD-10-CM | POA: Diagnosis not present

## 2022-04-06 DIAGNOSIS — I10 Essential (primary) hypertension: Secondary | ICD-10-CM | POA: Diagnosis not present

## 2022-04-09 DIAGNOSIS — S42302A Unspecified fracture of shaft of humerus, left arm, initial encounter for closed fracture: Secondary | ICD-10-CM | POA: Diagnosis not present

## 2022-04-10 DIAGNOSIS — E039 Hypothyroidism, unspecified: Secondary | ICD-10-CM | POA: Diagnosis not present

## 2022-04-10 DIAGNOSIS — E119 Type 2 diabetes mellitus without complications: Secondary | ICD-10-CM | POA: Diagnosis not present

## 2022-04-10 DIAGNOSIS — M5000 Cervical disc disorder with myelopathy, unspecified cervical region: Secondary | ICD-10-CM | POA: Diagnosis not present

## 2022-04-10 DIAGNOSIS — F32A Depression, unspecified: Secondary | ICD-10-CM | POA: Diagnosis not present

## 2022-04-10 DIAGNOSIS — I69398 Other sequelae of cerebral infarction: Secondary | ICD-10-CM | POA: Diagnosis not present

## 2022-04-10 DIAGNOSIS — Z6827 Body mass index (BMI) 27.0-27.9, adult: Secondary | ICD-10-CM | POA: Diagnosis not present

## 2022-04-10 DIAGNOSIS — J9611 Chronic respiratory failure with hypoxia: Secondary | ICD-10-CM | POA: Diagnosis not present

## 2022-04-10 DIAGNOSIS — I10 Essential (primary) hypertension: Secondary | ICD-10-CM | POA: Diagnosis not present

## 2022-04-10 DIAGNOSIS — G928 Other toxic encephalopathy: Secondary | ICD-10-CM | POA: Diagnosis not present

## 2022-04-10 DIAGNOSIS — Z741 Need for assistance with personal care: Secondary | ICD-10-CM | POA: Diagnosis not present

## 2022-04-10 DIAGNOSIS — I69318 Other symptoms and signs involving cognitive functions following cerebral infarction: Secondary | ICD-10-CM | POA: Diagnosis not present

## 2022-04-10 DIAGNOSIS — E43 Unspecified severe protein-calorie malnutrition: Secondary | ICD-10-CM | POA: Diagnosis not present

## 2022-04-10 DIAGNOSIS — B9620 Unspecified Escherichia coli [E. coli] as the cause of diseases classified elsewhere: Secondary | ICD-10-CM | POA: Diagnosis not present

## 2022-04-10 DIAGNOSIS — Z8616 Personal history of COVID-19: Secondary | ICD-10-CM | POA: Diagnosis not present

## 2022-04-10 DIAGNOSIS — Z9981 Dependence on supplemental oxygen: Secondary | ICD-10-CM | POA: Diagnosis not present

## 2022-04-10 DIAGNOSIS — R159 Full incontinence of feces: Secondary | ICD-10-CM | POA: Diagnosis not present

## 2022-04-10 DIAGNOSIS — Z87440 Personal history of urinary (tract) infections: Secondary | ICD-10-CM | POA: Diagnosis not present

## 2022-04-10 DIAGNOSIS — G40909 Epilepsy, unspecified, not intractable, without status epilepticus: Secondary | ICD-10-CM | POA: Diagnosis not present

## 2022-04-10 DIAGNOSIS — R32 Unspecified urinary incontinence: Secondary | ICD-10-CM | POA: Diagnosis not present

## 2022-04-10 DIAGNOSIS — D649 Anemia, unspecified: Secondary | ICD-10-CM | POA: Diagnosis not present

## 2022-04-10 DIAGNOSIS — E785 Hyperlipidemia, unspecified: Secondary | ICD-10-CM | POA: Diagnosis not present

## 2022-04-10 DIAGNOSIS — Z853 Personal history of malignant neoplasm of breast: Secondary | ICD-10-CM | POA: Diagnosis not present

## 2022-04-10 DIAGNOSIS — D709 Neutropenia, unspecified: Secondary | ICD-10-CM | POA: Diagnosis not present

## 2022-04-10 DIAGNOSIS — D696 Thrombocytopenia, unspecified: Secondary | ICD-10-CM | POA: Diagnosis not present

## 2022-04-10 DIAGNOSIS — H1132 Conjunctival hemorrhage, left eye: Secondary | ICD-10-CM | POA: Diagnosis not present

## 2022-04-11 DIAGNOSIS — G928 Other toxic encephalopathy: Secondary | ICD-10-CM | POA: Diagnosis not present

## 2022-04-11 DIAGNOSIS — I69318 Other symptoms and signs involving cognitive functions following cerebral infarction: Secondary | ICD-10-CM | POA: Diagnosis not present

## 2022-04-11 DIAGNOSIS — J9611 Chronic respiratory failure with hypoxia: Secondary | ICD-10-CM | POA: Diagnosis not present

## 2022-04-11 DIAGNOSIS — I69398 Other sequelae of cerebral infarction: Secondary | ICD-10-CM | POA: Diagnosis not present

## 2022-04-11 DIAGNOSIS — B9620 Unspecified Escherichia coli [E. coli] as the cause of diseases classified elsewhere: Secondary | ICD-10-CM | POA: Diagnosis not present

## 2022-04-11 DIAGNOSIS — G40909 Epilepsy, unspecified, not intractable, without status epilepticus: Secondary | ICD-10-CM | POA: Diagnosis not present

## 2022-04-12 DIAGNOSIS — G40909 Epilepsy, unspecified, not intractable, without status epilepticus: Secondary | ICD-10-CM | POA: Diagnosis not present

## 2022-04-12 DIAGNOSIS — G928 Other toxic encephalopathy: Secondary | ICD-10-CM | POA: Diagnosis not present

## 2022-04-12 DIAGNOSIS — J9611 Chronic respiratory failure with hypoxia: Secondary | ICD-10-CM | POA: Diagnosis not present

## 2022-04-12 DIAGNOSIS — B9620 Unspecified Escherichia coli [E. coli] as the cause of diseases classified elsewhere: Secondary | ICD-10-CM | POA: Diagnosis not present

## 2022-04-12 DIAGNOSIS — I69318 Other symptoms and signs involving cognitive functions following cerebral infarction: Secondary | ICD-10-CM | POA: Diagnosis not present

## 2022-04-12 DIAGNOSIS — I69398 Other sequelae of cerebral infarction: Secondary | ICD-10-CM | POA: Diagnosis not present

## 2022-04-13 DIAGNOSIS — J9611 Chronic respiratory failure with hypoxia: Secondary | ICD-10-CM | POA: Diagnosis not present

## 2022-04-13 DIAGNOSIS — G40909 Epilepsy, unspecified, not intractable, without status epilepticus: Secondary | ICD-10-CM | POA: Diagnosis not present

## 2022-04-13 DIAGNOSIS — B9620 Unspecified Escherichia coli [E. coli] as the cause of diseases classified elsewhere: Secondary | ICD-10-CM | POA: Diagnosis not present

## 2022-04-13 DIAGNOSIS — I69318 Other symptoms and signs involving cognitive functions following cerebral infarction: Secondary | ICD-10-CM | POA: Diagnosis not present

## 2022-04-13 DIAGNOSIS — G928 Other toxic encephalopathy: Secondary | ICD-10-CM | POA: Diagnosis not present

## 2022-04-13 DIAGNOSIS — I69398 Other sequelae of cerebral infarction: Secondary | ICD-10-CM | POA: Diagnosis not present

## 2022-04-17 DIAGNOSIS — I69318 Other symptoms and signs involving cognitive functions following cerebral infarction: Secondary | ICD-10-CM | POA: Diagnosis not present

## 2022-04-17 DIAGNOSIS — G40909 Epilepsy, unspecified, not intractable, without status epilepticus: Secondary | ICD-10-CM | POA: Diagnosis not present

## 2022-04-17 DIAGNOSIS — B9620 Unspecified Escherichia coli [E. coli] as the cause of diseases classified elsewhere: Secondary | ICD-10-CM | POA: Diagnosis not present

## 2022-04-17 DIAGNOSIS — I69398 Other sequelae of cerebral infarction: Secondary | ICD-10-CM | POA: Diagnosis not present

## 2022-04-17 DIAGNOSIS — J9611 Chronic respiratory failure with hypoxia: Secondary | ICD-10-CM | POA: Diagnosis not present

## 2022-04-17 DIAGNOSIS — G928 Other toxic encephalopathy: Secondary | ICD-10-CM | POA: Diagnosis not present

## 2022-04-18 DIAGNOSIS — G928 Other toxic encephalopathy: Secondary | ICD-10-CM | POA: Diagnosis not present

## 2022-04-18 DIAGNOSIS — I69398 Other sequelae of cerebral infarction: Secondary | ICD-10-CM | POA: Diagnosis not present

## 2022-04-18 DIAGNOSIS — B9620 Unspecified Escherichia coli [E. coli] as the cause of diseases classified elsewhere: Secondary | ICD-10-CM | POA: Diagnosis not present

## 2022-04-18 DIAGNOSIS — J9611 Chronic respiratory failure with hypoxia: Secondary | ICD-10-CM | POA: Diagnosis not present

## 2022-04-18 DIAGNOSIS — G40909 Epilepsy, unspecified, not intractable, without status epilepticus: Secondary | ICD-10-CM | POA: Diagnosis not present

## 2022-04-18 DIAGNOSIS — I69318 Other symptoms and signs involving cognitive functions following cerebral infarction: Secondary | ICD-10-CM | POA: Diagnosis not present

## 2022-04-19 DIAGNOSIS — E559 Vitamin D deficiency, unspecified: Secondary | ICD-10-CM | POA: Diagnosis not present

## 2022-04-19 DIAGNOSIS — Z79899 Other long term (current) drug therapy: Secondary | ICD-10-CM | POA: Diagnosis not present

## 2022-04-19 DIAGNOSIS — I69318 Other symptoms and signs involving cognitive functions following cerebral infarction: Secondary | ICD-10-CM | POA: Diagnosis not present

## 2022-04-19 DIAGNOSIS — B9620 Unspecified Escherichia coli [E. coli] as the cause of diseases classified elsewhere: Secondary | ICD-10-CM | POA: Diagnosis not present

## 2022-04-19 DIAGNOSIS — I69398 Other sequelae of cerebral infarction: Secondary | ICD-10-CM | POA: Diagnosis not present

## 2022-04-19 DIAGNOSIS — G928 Other toxic encephalopathy: Secondary | ICD-10-CM | POA: Diagnosis not present

## 2022-04-19 DIAGNOSIS — E782 Mixed hyperlipidemia: Secondary | ICD-10-CM | POA: Diagnosis not present

## 2022-04-19 DIAGNOSIS — J9611 Chronic respiratory failure with hypoxia: Secondary | ICD-10-CM | POA: Diagnosis not present

## 2022-04-19 DIAGNOSIS — G40909 Epilepsy, unspecified, not intractable, without status epilepticus: Secondary | ICD-10-CM | POA: Diagnosis not present

## 2022-04-19 DIAGNOSIS — E038 Other specified hypothyroidism: Secondary | ICD-10-CM | POA: Diagnosis not present

## 2022-04-19 DIAGNOSIS — D518 Other vitamin B12 deficiency anemias: Secondary | ICD-10-CM | POA: Diagnosis not present

## 2022-04-19 DIAGNOSIS — E119 Type 2 diabetes mellitus without complications: Secondary | ICD-10-CM | POA: Diagnosis not present

## 2022-04-20 DIAGNOSIS — B9620 Unspecified Escherichia coli [E. coli] as the cause of diseases classified elsewhere: Secondary | ICD-10-CM | POA: Diagnosis not present

## 2022-04-20 DIAGNOSIS — I69318 Other symptoms and signs involving cognitive functions following cerebral infarction: Secondary | ICD-10-CM | POA: Diagnosis not present

## 2022-04-20 DIAGNOSIS — G40909 Epilepsy, unspecified, not intractable, without status epilepticus: Secondary | ICD-10-CM | POA: Diagnosis not present

## 2022-04-20 DIAGNOSIS — G928 Other toxic encephalopathy: Secondary | ICD-10-CM | POA: Diagnosis not present

## 2022-04-20 DIAGNOSIS — J9611 Chronic respiratory failure with hypoxia: Secondary | ICD-10-CM | POA: Diagnosis not present

## 2022-04-20 DIAGNOSIS — I69398 Other sequelae of cerebral infarction: Secondary | ICD-10-CM | POA: Diagnosis not present

## 2022-04-21 DIAGNOSIS — E038 Other specified hypothyroidism: Secondary | ICD-10-CM | POA: Diagnosis not present

## 2022-04-21 DIAGNOSIS — E119 Type 2 diabetes mellitus without complications: Secondary | ICD-10-CM | POA: Diagnosis not present

## 2022-04-21 DIAGNOSIS — E782 Mixed hyperlipidemia: Secondary | ICD-10-CM | POA: Diagnosis not present

## 2022-04-21 DIAGNOSIS — I10 Essential (primary) hypertension: Secondary | ICD-10-CM | POA: Diagnosis not present

## 2022-04-21 DIAGNOSIS — D518 Other vitamin B12 deficiency anemias: Secondary | ICD-10-CM | POA: Diagnosis not present

## 2022-04-21 DIAGNOSIS — E559 Vitamin D deficiency, unspecified: Secondary | ICD-10-CM | POA: Diagnosis not present

## 2022-04-24 DIAGNOSIS — G928 Other toxic encephalopathy: Secondary | ICD-10-CM | POA: Diagnosis not present

## 2022-04-24 DIAGNOSIS — G40909 Epilepsy, unspecified, not intractable, without status epilepticus: Secondary | ICD-10-CM | POA: Diagnosis not present

## 2022-04-24 DIAGNOSIS — J9611 Chronic respiratory failure with hypoxia: Secondary | ICD-10-CM | POA: Diagnosis not present

## 2022-04-24 DIAGNOSIS — I69318 Other symptoms and signs involving cognitive functions following cerebral infarction: Secondary | ICD-10-CM | POA: Diagnosis not present

## 2022-04-24 DIAGNOSIS — I69398 Other sequelae of cerebral infarction: Secondary | ICD-10-CM | POA: Diagnosis not present

## 2022-04-24 DIAGNOSIS — B9620 Unspecified Escherichia coli [E. coli] as the cause of diseases classified elsewhere: Secondary | ICD-10-CM | POA: Diagnosis not present

## 2022-04-25 DIAGNOSIS — I69318 Other symptoms and signs involving cognitive functions following cerebral infarction: Secondary | ICD-10-CM | POA: Diagnosis not present

## 2022-04-25 DIAGNOSIS — I69398 Other sequelae of cerebral infarction: Secondary | ICD-10-CM | POA: Diagnosis not present

## 2022-04-25 DIAGNOSIS — J9611 Chronic respiratory failure with hypoxia: Secondary | ICD-10-CM | POA: Diagnosis not present

## 2022-04-25 DIAGNOSIS — G40909 Epilepsy, unspecified, not intractable, without status epilepticus: Secondary | ICD-10-CM | POA: Diagnosis not present

## 2022-04-25 DIAGNOSIS — G928 Other toxic encephalopathy: Secondary | ICD-10-CM | POA: Diagnosis not present

## 2022-04-25 DIAGNOSIS — B9620 Unspecified Escherichia coli [E. coli] as the cause of diseases classified elsewhere: Secondary | ICD-10-CM | POA: Diagnosis not present

## 2022-04-26 DIAGNOSIS — C50919 Malignant neoplasm of unspecified site of unspecified female breast: Secondary | ICD-10-CM | POA: Diagnosis not present

## 2022-04-26 DIAGNOSIS — E870 Hyperosmolality and hypernatremia: Secondary | ICD-10-CM | POA: Diagnosis not present

## 2022-04-26 DIAGNOSIS — I69318 Other symptoms and signs involving cognitive functions following cerebral infarction: Secondary | ICD-10-CM | POA: Diagnosis not present

## 2022-04-26 DIAGNOSIS — I69398 Other sequelae of cerebral infarction: Secondary | ICD-10-CM | POA: Diagnosis not present

## 2022-04-26 DIAGNOSIS — G459 Transient cerebral ischemic attack, unspecified: Secondary | ICD-10-CM | POA: Diagnosis not present

## 2022-04-26 DIAGNOSIS — J9611 Chronic respiratory failure with hypoxia: Secondary | ICD-10-CM | POA: Diagnosis not present

## 2022-04-26 DIAGNOSIS — B9620 Unspecified Escherichia coli [E. coli] as the cause of diseases classified elsewhere: Secondary | ICD-10-CM | POA: Diagnosis not present

## 2022-04-26 DIAGNOSIS — E119 Type 2 diabetes mellitus without complications: Secondary | ICD-10-CM | POA: Diagnosis not present

## 2022-04-26 DIAGNOSIS — G40909 Epilepsy, unspecified, not intractable, without status epilepticus: Secondary | ICD-10-CM | POA: Diagnosis not present

## 2022-04-26 DIAGNOSIS — S42292A Other displaced fracture of upper end of left humerus, initial encounter for closed fracture: Secondary | ICD-10-CM | POA: Diagnosis not present

## 2022-04-26 DIAGNOSIS — M25512 Pain in left shoulder: Secondary | ICD-10-CM | POA: Diagnosis not present

## 2022-04-26 DIAGNOSIS — E559 Vitamin D deficiency, unspecified: Secondary | ICD-10-CM | POA: Diagnosis not present

## 2022-04-26 DIAGNOSIS — E039 Hypothyroidism, unspecified: Secondary | ICD-10-CM | POA: Diagnosis not present

## 2022-04-26 DIAGNOSIS — G928 Other toxic encephalopathy: Secondary | ICD-10-CM | POA: Diagnosis not present

## 2022-04-30 DIAGNOSIS — B9620 Unspecified Escherichia coli [E. coli] as the cause of diseases classified elsewhere: Secondary | ICD-10-CM | POA: Diagnosis not present

## 2022-04-30 DIAGNOSIS — G40909 Epilepsy, unspecified, not intractable, without status epilepticus: Secondary | ICD-10-CM | POA: Diagnosis not present

## 2022-04-30 DIAGNOSIS — I69398 Other sequelae of cerebral infarction: Secondary | ICD-10-CM | POA: Diagnosis not present

## 2022-04-30 DIAGNOSIS — J9611 Chronic respiratory failure with hypoxia: Secondary | ICD-10-CM | POA: Diagnosis not present

## 2022-04-30 DIAGNOSIS — G928 Other toxic encephalopathy: Secondary | ICD-10-CM | POA: Diagnosis not present

## 2022-04-30 DIAGNOSIS — I69318 Other symptoms and signs involving cognitive functions following cerebral infarction: Secondary | ICD-10-CM | POA: Diagnosis not present

## 2022-05-01 DIAGNOSIS — I69398 Other sequelae of cerebral infarction: Secondary | ICD-10-CM | POA: Diagnosis not present

## 2022-05-01 DIAGNOSIS — I69318 Other symptoms and signs involving cognitive functions following cerebral infarction: Secondary | ICD-10-CM | POA: Diagnosis not present

## 2022-05-01 DIAGNOSIS — G40909 Epilepsy, unspecified, not intractable, without status epilepticus: Secondary | ICD-10-CM | POA: Diagnosis not present

## 2022-05-01 DIAGNOSIS — M79675 Pain in left toe(s): Secondary | ICD-10-CM | POA: Diagnosis not present

## 2022-05-01 DIAGNOSIS — B9620 Unspecified Escherichia coli [E. coli] as the cause of diseases classified elsewhere: Secondary | ICD-10-CM | POA: Diagnosis not present

## 2022-05-01 DIAGNOSIS — M79674 Pain in right toe(s): Secondary | ICD-10-CM | POA: Diagnosis not present

## 2022-05-01 DIAGNOSIS — J9611 Chronic respiratory failure with hypoxia: Secondary | ICD-10-CM | POA: Diagnosis not present

## 2022-05-01 DIAGNOSIS — B351 Tinea unguium: Secondary | ICD-10-CM | POA: Diagnosis not present

## 2022-05-01 DIAGNOSIS — G928 Other toxic encephalopathy: Secondary | ICD-10-CM | POA: Diagnosis not present

## 2022-05-02 DIAGNOSIS — G928 Other toxic encephalopathy: Secondary | ICD-10-CM | POA: Diagnosis not present

## 2022-05-02 DIAGNOSIS — G40909 Epilepsy, unspecified, not intractable, without status epilepticus: Secondary | ICD-10-CM | POA: Diagnosis not present

## 2022-05-02 DIAGNOSIS — B9620 Unspecified Escherichia coli [E. coli] as the cause of diseases classified elsewhere: Secondary | ICD-10-CM | POA: Diagnosis not present

## 2022-05-02 DIAGNOSIS — I69398 Other sequelae of cerebral infarction: Secondary | ICD-10-CM | POA: Diagnosis not present

## 2022-05-02 DIAGNOSIS — I69318 Other symptoms and signs involving cognitive functions following cerebral infarction: Secondary | ICD-10-CM | POA: Diagnosis not present

## 2022-05-02 DIAGNOSIS — J9611 Chronic respiratory failure with hypoxia: Secondary | ICD-10-CM | POA: Diagnosis not present

## 2022-05-03 DIAGNOSIS — B9620 Unspecified Escherichia coli [E. coli] as the cause of diseases classified elsewhere: Secondary | ICD-10-CM | POA: Diagnosis not present

## 2022-05-03 DIAGNOSIS — J9611 Chronic respiratory failure with hypoxia: Secondary | ICD-10-CM | POA: Diagnosis not present

## 2022-05-03 DIAGNOSIS — I69398 Other sequelae of cerebral infarction: Secondary | ICD-10-CM | POA: Diagnosis not present

## 2022-05-03 DIAGNOSIS — U071 COVID-19: Secondary | ICD-10-CM | POA: Diagnosis not present

## 2022-05-03 DIAGNOSIS — G40909 Epilepsy, unspecified, not intractable, without status epilepticus: Secondary | ICD-10-CM | POA: Diagnosis not present

## 2022-05-03 DIAGNOSIS — G928 Other toxic encephalopathy: Secondary | ICD-10-CM | POA: Diagnosis not present

## 2022-05-03 DIAGNOSIS — Z20828 Contact with and (suspected) exposure to other viral communicable diseases: Secondary | ICD-10-CM | POA: Diagnosis not present

## 2022-05-03 DIAGNOSIS — I69318 Other symptoms and signs involving cognitive functions following cerebral infarction: Secondary | ICD-10-CM | POA: Diagnosis not present

## 2022-05-04 DIAGNOSIS — I69318 Other symptoms and signs involving cognitive functions following cerebral infarction: Secondary | ICD-10-CM | POA: Diagnosis not present

## 2022-05-04 DIAGNOSIS — G40909 Epilepsy, unspecified, not intractable, without status epilepticus: Secondary | ICD-10-CM | POA: Diagnosis not present

## 2022-05-04 DIAGNOSIS — B9620 Unspecified Escherichia coli [E. coli] as the cause of diseases classified elsewhere: Secondary | ICD-10-CM | POA: Diagnosis not present

## 2022-05-04 DIAGNOSIS — J9611 Chronic respiratory failure with hypoxia: Secondary | ICD-10-CM | POA: Diagnosis not present

## 2022-05-04 DIAGNOSIS — I69398 Other sequelae of cerebral infarction: Secondary | ICD-10-CM | POA: Diagnosis not present

## 2022-05-04 DIAGNOSIS — G928 Other toxic encephalopathy: Secondary | ICD-10-CM | POA: Diagnosis not present

## 2022-05-07 ENCOUNTER — Ambulatory Visit: Payer: Self-pay | Admitting: Licensed Clinical Social Worker

## 2022-05-07 DIAGNOSIS — I10 Essential (primary) hypertension: Secondary | ICD-10-CM | POA: Diagnosis not present

## 2022-05-07 NOTE — Patient Outreach (Signed)
  Care Coordination   05/07/2022 Name: Anna Hunt MRN: [id number] DOB: 11/03/1945   Care Coordination Outreach Attempts:  An unsuccessful telephone outreach was attempted today to offer the patient information about available care coordination services as a benefit of their health plan.   Follow Up Plan:  Additional outreach attempts will be made to offer the patient care coordination information and services.   Encounter Outcome:  No Answer  Care Coordination Interventions Activated:  No   Care Coordination Interventions:  No, not indicated    Casimer Lanius, [place] [phone]

## 2022-05-08 DIAGNOSIS — J9611 Chronic respiratory failure with hypoxia: Secondary | ICD-10-CM | POA: Diagnosis not present

## 2022-05-08 DIAGNOSIS — B9620 Unspecified Escherichia coli [E. coli] as the cause of diseases classified elsewhere: Secondary | ICD-10-CM | POA: Diagnosis not present

## 2022-05-08 DIAGNOSIS — I69318 Other symptoms and signs involving cognitive functions following cerebral infarction: Secondary | ICD-10-CM | POA: Diagnosis not present

## 2022-05-08 DIAGNOSIS — G40909 Epilepsy, unspecified, not intractable, without status epilepticus: Secondary | ICD-10-CM | POA: Diagnosis not present

## 2022-05-08 DIAGNOSIS — I69398 Other sequelae of cerebral infarction: Secondary | ICD-10-CM | POA: Diagnosis not present

## 2022-05-08 DIAGNOSIS — G928 Other toxic encephalopathy: Secondary | ICD-10-CM | POA: Diagnosis not present

## 2022-05-09 DIAGNOSIS — B9620 Unspecified Escherichia coli [E. coli] as the cause of diseases classified elsewhere: Secondary | ICD-10-CM | POA: Diagnosis not present

## 2022-05-09 DIAGNOSIS — I69398 Other sequelae of cerebral infarction: Secondary | ICD-10-CM | POA: Diagnosis not present

## 2022-05-09 DIAGNOSIS — G928 Other toxic encephalopathy: Secondary | ICD-10-CM | POA: Diagnosis not present

## 2022-05-09 DIAGNOSIS — G40909 Epilepsy, unspecified, not intractable, without status epilepticus: Secondary | ICD-10-CM | POA: Diagnosis not present

## 2022-05-09 DIAGNOSIS — I69318 Other symptoms and signs involving cognitive functions following cerebral infarction: Secondary | ICD-10-CM | POA: Diagnosis not present

## 2022-05-09 DIAGNOSIS — J9611 Chronic respiratory failure with hypoxia: Secondary | ICD-10-CM | POA: Diagnosis not present

## 2022-05-10 DIAGNOSIS — J9611 Chronic respiratory failure with hypoxia: Secondary | ICD-10-CM | POA: Diagnosis not present

## 2022-05-10 DIAGNOSIS — I69318 Other symptoms and signs involving cognitive functions following cerebral infarction: Secondary | ICD-10-CM | POA: Diagnosis not present

## 2022-05-10 DIAGNOSIS — B9620 Unspecified Escherichia coli [E. coli] as the cause of diseases classified elsewhere: Secondary | ICD-10-CM | POA: Diagnosis not present

## 2022-05-10 DIAGNOSIS — I69398 Other sequelae of cerebral infarction: Secondary | ICD-10-CM | POA: Diagnosis not present

## 2022-05-10 DIAGNOSIS — U071 COVID-19: Secondary | ICD-10-CM | POA: Diagnosis not present

## 2022-05-10 DIAGNOSIS — G928 Other toxic encephalopathy: Secondary | ICD-10-CM | POA: Diagnosis not present

## 2022-05-10 DIAGNOSIS — G40909 Epilepsy, unspecified, not intractable, without status epilepticus: Secondary | ICD-10-CM | POA: Diagnosis not present

## 2022-05-10 DIAGNOSIS — Z20828 Contact with and (suspected) exposure to other viral communicable diseases: Secondary | ICD-10-CM | POA: Diagnosis not present

## 2022-05-11 DIAGNOSIS — E43 Unspecified severe protein-calorie malnutrition: Secondary | ICD-10-CM | POA: Diagnosis not present

## 2022-05-11 DIAGNOSIS — Z741 Need for assistance with personal care: Secondary | ICD-10-CM | POA: Diagnosis not present

## 2022-05-11 DIAGNOSIS — H1132 Conjunctival hemorrhage, left eye: Secondary | ICD-10-CM | POA: Diagnosis not present

## 2022-05-11 DIAGNOSIS — D696 Thrombocytopenia, unspecified: Secondary | ICD-10-CM | POA: Diagnosis not present

## 2022-05-11 DIAGNOSIS — E039 Hypothyroidism, unspecified: Secondary | ICD-10-CM | POA: Diagnosis not present

## 2022-05-11 DIAGNOSIS — Z8616 Personal history of COVID-19: Secondary | ICD-10-CM | POA: Diagnosis not present

## 2022-05-11 DIAGNOSIS — R32 Unspecified urinary incontinence: Secondary | ICD-10-CM | POA: Diagnosis not present

## 2022-05-11 DIAGNOSIS — Z87440 Personal history of urinary (tract) infections: Secondary | ICD-10-CM | POA: Diagnosis not present

## 2022-05-11 DIAGNOSIS — G9389 Other specified disorders of brain: Secondary | ICD-10-CM | POA: Diagnosis not present

## 2022-05-11 DIAGNOSIS — D649 Anemia, unspecified: Secondary | ICD-10-CM | POA: Diagnosis not present

## 2022-05-11 DIAGNOSIS — G928 Other toxic encephalopathy: Secondary | ICD-10-CM | POA: Diagnosis not present

## 2022-05-11 DIAGNOSIS — D709 Neutropenia, unspecified: Secondary | ICD-10-CM | POA: Diagnosis not present

## 2022-05-11 DIAGNOSIS — E119 Type 2 diabetes mellitus without complications: Secondary | ICD-10-CM | POA: Diagnosis not present

## 2022-05-11 DIAGNOSIS — S0990XA Unspecified injury of head, initial encounter: Secondary | ICD-10-CM | POA: Diagnosis not present

## 2022-05-11 DIAGNOSIS — G319 Degenerative disease of nervous system, unspecified: Secondary | ICD-10-CM | POA: Diagnosis not present

## 2022-05-11 DIAGNOSIS — E785 Hyperlipidemia, unspecified: Secondary | ICD-10-CM | POA: Diagnosis not present

## 2022-05-11 DIAGNOSIS — B9620 Unspecified Escherichia coli [E. coli] as the cause of diseases classified elsewhere: Secondary | ICD-10-CM | POA: Diagnosis not present

## 2022-05-11 DIAGNOSIS — I517 Cardiomegaly: Secondary | ICD-10-CM | POA: Diagnosis not present

## 2022-05-11 DIAGNOSIS — S0993XA Unspecified injury of face, initial encounter: Secondary | ICD-10-CM | POA: Diagnosis not present

## 2022-05-11 DIAGNOSIS — W01198A Fall on same level from slipping, tripping and stumbling with subsequent striking against other object, initial encounter: Secondary | ICD-10-CM | POA: Diagnosis not present

## 2022-05-11 DIAGNOSIS — M5000 Cervical disc disorder with myelopathy, unspecified cervical region: Secondary | ICD-10-CM | POA: Diagnosis not present

## 2022-05-11 DIAGNOSIS — I10 Essential (primary) hypertension: Secondary | ICD-10-CM | POA: Diagnosis not present

## 2022-05-11 DIAGNOSIS — R159 Full incontinence of feces: Secondary | ICD-10-CM | POA: Diagnosis not present

## 2022-05-11 DIAGNOSIS — I69398 Other sequelae of cerebral infarction: Secondary | ICD-10-CM | POA: Diagnosis not present

## 2022-05-11 DIAGNOSIS — Z853 Personal history of malignant neoplasm of breast: Secondary | ICD-10-CM | POA: Diagnosis not present

## 2022-05-11 DIAGNOSIS — J9611 Chronic respiratory failure with hypoxia: Secondary | ICD-10-CM | POA: Diagnosis not present

## 2022-05-11 DIAGNOSIS — S199XXA Unspecified injury of neck, initial encounter: Secondary | ICD-10-CM | POA: Diagnosis not present

## 2022-05-11 DIAGNOSIS — Z9981 Dependence on supplemental oxygen: Secondary | ICD-10-CM | POA: Diagnosis not present

## 2022-05-11 DIAGNOSIS — G40909 Epilepsy, unspecified, not intractable, without status epilepticus: Secondary | ICD-10-CM | POA: Diagnosis not present

## 2022-05-11 DIAGNOSIS — Y998 Other external cause status: Secondary | ICD-10-CM | POA: Diagnosis not present

## 2022-05-11 DIAGNOSIS — F32A Depression, unspecified: Secondary | ICD-10-CM | POA: Diagnosis not present

## 2022-05-11 DIAGNOSIS — Z6827 Body mass index (BMI) 27.0-27.9, adult: Secondary | ICD-10-CM | POA: Diagnosis not present

## 2022-05-11 DIAGNOSIS — I69318 Other symptoms and signs involving cognitive functions following cerebral infarction: Secondary | ICD-10-CM | POA: Diagnosis not present

## 2022-05-15 DIAGNOSIS — J9611 Chronic respiratory failure with hypoxia: Secondary | ICD-10-CM | POA: Diagnosis not present

## 2022-05-15 DIAGNOSIS — G928 Other toxic encephalopathy: Secondary | ICD-10-CM | POA: Diagnosis not present

## 2022-05-15 DIAGNOSIS — B9620 Unspecified Escherichia coli [E. coli] as the cause of diseases classified elsewhere: Secondary | ICD-10-CM | POA: Diagnosis not present

## 2022-05-15 DIAGNOSIS — I69318 Other symptoms and signs involving cognitive functions following cerebral infarction: Secondary | ICD-10-CM | POA: Diagnosis not present

## 2022-05-15 DIAGNOSIS — I69398 Other sequelae of cerebral infarction: Secondary | ICD-10-CM | POA: Diagnosis not present

## 2022-05-15 DIAGNOSIS — G40909 Epilepsy, unspecified, not intractable, without status epilepticus: Secondary | ICD-10-CM | POA: Diagnosis not present

## 2022-05-16 DIAGNOSIS — B9620 Unspecified Escherichia coli [E. coli] as the cause of diseases classified elsewhere: Secondary | ICD-10-CM | POA: Diagnosis not present

## 2022-05-16 DIAGNOSIS — R531 Weakness: Secondary | ICD-10-CM | POA: Diagnosis not present

## 2022-05-16 DIAGNOSIS — G40909 Epilepsy, unspecified, not intractable, without status epilepticus: Secondary | ICD-10-CM | POA: Diagnosis not present

## 2022-05-16 DIAGNOSIS — G9389 Other specified disorders of brain: Secondary | ICD-10-CM | POA: Diagnosis not present

## 2022-05-16 DIAGNOSIS — S0990XA Unspecified injury of head, initial encounter: Secondary | ICD-10-CM | POA: Diagnosis not present

## 2022-05-16 DIAGNOSIS — Y998 Other external cause status: Secondary | ICD-10-CM | POA: Diagnosis not present

## 2022-05-16 DIAGNOSIS — I517 Cardiomegaly: Secondary | ICD-10-CM | POA: Diagnosis not present

## 2022-05-16 DIAGNOSIS — I69398 Other sequelae of cerebral infarction: Secondary | ICD-10-CM | POA: Diagnosis not present

## 2022-05-16 DIAGNOSIS — G928 Other toxic encephalopathy: Secondary | ICD-10-CM | POA: Diagnosis not present

## 2022-05-16 DIAGNOSIS — I69318 Other symptoms and signs involving cognitive functions following cerebral infarction: Secondary | ICD-10-CM | POA: Diagnosis not present

## 2022-05-16 DIAGNOSIS — W01198A Fall on same level from slipping, tripping and stumbling with subsequent striking against other object, initial encounter: Secondary | ICD-10-CM | POA: Diagnosis not present

## 2022-05-16 DIAGNOSIS — G319 Degenerative disease of nervous system, unspecified: Secondary | ICD-10-CM | POA: Diagnosis not present

## 2022-05-16 DIAGNOSIS — S0993XA Unspecified injury of face, initial encounter: Secondary | ICD-10-CM | POA: Diagnosis not present

## 2022-05-16 DIAGNOSIS — S199XXA Unspecified injury of neck, initial encounter: Secondary | ICD-10-CM | POA: Diagnosis not present

## 2022-05-16 DIAGNOSIS — J9611 Chronic respiratory failure with hypoxia: Secondary | ICD-10-CM | POA: Diagnosis not present

## 2022-05-16 DIAGNOSIS — Z7401 Bed confinement status: Secondary | ICD-10-CM | POA: Diagnosis not present

## 2022-05-17 DIAGNOSIS — I69318 Other symptoms and signs involving cognitive functions following cerebral infarction: Secondary | ICD-10-CM | POA: Diagnosis not present

## 2022-05-17 DIAGNOSIS — G928 Other toxic encephalopathy: Secondary | ICD-10-CM | POA: Diagnosis not present

## 2022-05-17 DIAGNOSIS — Z20828 Contact with and (suspected) exposure to other viral communicable diseases: Secondary | ICD-10-CM | POA: Diagnosis not present

## 2022-05-17 DIAGNOSIS — I69398 Other sequelae of cerebral infarction: Secondary | ICD-10-CM | POA: Diagnosis not present

## 2022-05-17 DIAGNOSIS — R296 Repeated falls: Secondary | ICD-10-CM | POA: Diagnosis not present

## 2022-05-17 DIAGNOSIS — G40909 Epilepsy, unspecified, not intractable, without status epilepticus: Secondary | ICD-10-CM | POA: Diagnosis not present

## 2022-05-17 DIAGNOSIS — U071 COVID-19: Secondary | ICD-10-CM | POA: Diagnosis not present

## 2022-05-17 DIAGNOSIS — B9620 Unspecified Escherichia coli [E. coli] as the cause of diseases classified elsewhere: Secondary | ICD-10-CM | POA: Diagnosis not present

## 2022-05-17 DIAGNOSIS — J9611 Chronic respiratory failure with hypoxia: Secondary | ICD-10-CM | POA: Diagnosis not present

## 2022-05-17 DIAGNOSIS — I447 Left bundle-branch block, unspecified: Secondary | ICD-10-CM | POA: Diagnosis not present

## 2022-05-18 DIAGNOSIS — G40909 Epilepsy, unspecified, not intractable, without status epilepticus: Secondary | ICD-10-CM | POA: Diagnosis not present

## 2022-05-18 DIAGNOSIS — J9611 Chronic respiratory failure with hypoxia: Secondary | ICD-10-CM | POA: Diagnosis not present

## 2022-05-18 DIAGNOSIS — I69318 Other symptoms and signs involving cognitive functions following cerebral infarction: Secondary | ICD-10-CM | POA: Diagnosis not present

## 2022-05-18 DIAGNOSIS — G928 Other toxic encephalopathy: Secondary | ICD-10-CM | POA: Diagnosis not present

## 2022-05-18 DIAGNOSIS — I69398 Other sequelae of cerebral infarction: Secondary | ICD-10-CM | POA: Diagnosis not present

## 2022-05-18 DIAGNOSIS — B9620 Unspecified Escherichia coli [E. coli] as the cause of diseases classified elsewhere: Secondary | ICD-10-CM | POA: Diagnosis not present

## 2022-05-20 DIAGNOSIS — I10 Essential (primary) hypertension: Secondary | ICD-10-CM | POA: Diagnosis not present

## 2022-05-20 DIAGNOSIS — E038 Other specified hypothyroidism: Secondary | ICD-10-CM | POA: Diagnosis not present

## 2022-05-20 DIAGNOSIS — E559 Vitamin D deficiency, unspecified: Secondary | ICD-10-CM | POA: Diagnosis not present

## 2022-05-20 DIAGNOSIS — E782 Mixed hyperlipidemia: Secondary | ICD-10-CM | POA: Diagnosis not present

## 2022-05-20 DIAGNOSIS — E119 Type 2 diabetes mellitus without complications: Secondary | ICD-10-CM | POA: Diagnosis not present

## 2022-05-20 DIAGNOSIS — D518 Other vitamin B12 deficiency anemias: Secondary | ICD-10-CM | POA: Diagnosis not present

## 2022-05-22 DIAGNOSIS — G928 Other toxic encephalopathy: Secondary | ICD-10-CM | POA: Diagnosis not present

## 2022-05-22 DIAGNOSIS — I69318 Other symptoms and signs involving cognitive functions following cerebral infarction: Secondary | ICD-10-CM | POA: Diagnosis not present

## 2022-05-22 DIAGNOSIS — J9611 Chronic respiratory failure with hypoxia: Secondary | ICD-10-CM | POA: Diagnosis not present

## 2022-05-22 DIAGNOSIS — I69398 Other sequelae of cerebral infarction: Secondary | ICD-10-CM | POA: Diagnosis not present

## 2022-05-22 DIAGNOSIS — B9620 Unspecified Escherichia coli [E. coli] as the cause of diseases classified elsewhere: Secondary | ICD-10-CM | POA: Diagnosis not present

## 2022-05-22 DIAGNOSIS — G40909 Epilepsy, unspecified, not intractable, without status epilepticus: Secondary | ICD-10-CM | POA: Diagnosis not present

## 2022-05-23 DIAGNOSIS — I69318 Other symptoms and signs involving cognitive functions following cerebral infarction: Secondary | ICD-10-CM | POA: Diagnosis not present

## 2022-05-23 DIAGNOSIS — G40909 Epilepsy, unspecified, not intractable, without status epilepticus: Secondary | ICD-10-CM | POA: Diagnosis not present

## 2022-05-23 DIAGNOSIS — B9620 Unspecified Escherichia coli [E. coli] as the cause of diseases classified elsewhere: Secondary | ICD-10-CM | POA: Diagnosis not present

## 2022-05-23 DIAGNOSIS — G928 Other toxic encephalopathy: Secondary | ICD-10-CM | POA: Diagnosis not present

## 2022-05-23 DIAGNOSIS — I69398 Other sequelae of cerebral infarction: Secondary | ICD-10-CM | POA: Diagnosis not present

## 2022-05-23 DIAGNOSIS — J9611 Chronic respiratory failure with hypoxia: Secondary | ICD-10-CM | POA: Diagnosis not present

## 2022-05-24 DIAGNOSIS — I69318 Other symptoms and signs involving cognitive functions following cerebral infarction: Secondary | ICD-10-CM | POA: Diagnosis not present

## 2022-05-24 DIAGNOSIS — B9620 Unspecified Escherichia coli [E. coli] as the cause of diseases classified elsewhere: Secondary | ICD-10-CM | POA: Diagnosis not present

## 2022-05-24 DIAGNOSIS — J9611 Chronic respiratory failure with hypoxia: Secondary | ICD-10-CM | POA: Diagnosis not present

## 2022-05-24 DIAGNOSIS — G928 Other toxic encephalopathy: Secondary | ICD-10-CM | POA: Diagnosis not present

## 2022-05-24 DIAGNOSIS — G40909 Epilepsy, unspecified, not intractable, without status epilepticus: Secondary | ICD-10-CM | POA: Diagnosis not present

## 2022-05-24 DIAGNOSIS — I69398 Other sequelae of cerebral infarction: Secondary | ICD-10-CM | POA: Diagnosis not present

## 2022-05-28 DIAGNOSIS — J9611 Chronic respiratory failure with hypoxia: Secondary | ICD-10-CM | POA: Diagnosis not present

## 2022-05-28 DIAGNOSIS — I69318 Other symptoms and signs involving cognitive functions following cerebral infarction: Secondary | ICD-10-CM | POA: Diagnosis not present

## 2022-05-28 DIAGNOSIS — G928 Other toxic encephalopathy: Secondary | ICD-10-CM | POA: Diagnosis not present

## 2022-05-28 DIAGNOSIS — B9620 Unspecified Escherichia coli [E. coli] as the cause of diseases classified elsewhere: Secondary | ICD-10-CM | POA: Diagnosis not present

## 2022-05-28 DIAGNOSIS — G40909 Epilepsy, unspecified, not intractable, without status epilepticus: Secondary | ICD-10-CM | POA: Diagnosis not present

## 2022-05-28 DIAGNOSIS — I69398 Other sequelae of cerebral infarction: Secondary | ICD-10-CM | POA: Diagnosis not present

## 2022-05-29 DIAGNOSIS — G928 Other toxic encephalopathy: Secondary | ICD-10-CM | POA: Diagnosis not present

## 2022-05-29 DIAGNOSIS — J9611 Chronic respiratory failure with hypoxia: Secondary | ICD-10-CM | POA: Diagnosis not present

## 2022-05-29 DIAGNOSIS — B9620 Unspecified Escherichia coli [E. coli] as the cause of diseases classified elsewhere: Secondary | ICD-10-CM | POA: Diagnosis not present

## 2022-05-29 DIAGNOSIS — I69318 Other symptoms and signs involving cognitive functions following cerebral infarction: Secondary | ICD-10-CM | POA: Diagnosis not present

## 2022-05-29 DIAGNOSIS — I69398 Other sequelae of cerebral infarction: Secondary | ICD-10-CM | POA: Diagnosis not present

## 2022-05-29 DIAGNOSIS — G40909 Epilepsy, unspecified, not intractable, without status epilepticus: Secondary | ICD-10-CM | POA: Diagnosis not present

## 2022-05-30 DIAGNOSIS — G928 Other toxic encephalopathy: Secondary | ICD-10-CM | POA: Diagnosis not present

## 2022-05-30 DIAGNOSIS — J9611 Chronic respiratory failure with hypoxia: Secondary | ICD-10-CM | POA: Diagnosis not present

## 2022-05-30 DIAGNOSIS — G40909 Epilepsy, unspecified, not intractable, without status epilepticus: Secondary | ICD-10-CM | POA: Diagnosis not present

## 2022-05-30 DIAGNOSIS — B9620 Unspecified Escherichia coli [E. coli] as the cause of diseases classified elsewhere: Secondary | ICD-10-CM | POA: Diagnosis not present

## 2022-05-30 DIAGNOSIS — I69398 Other sequelae of cerebral infarction: Secondary | ICD-10-CM | POA: Diagnosis not present

## 2022-05-30 DIAGNOSIS — I69318 Other symptoms and signs involving cognitive functions following cerebral infarction: Secondary | ICD-10-CM | POA: Diagnosis not present

## 2022-05-31 DIAGNOSIS — I69398 Other sequelae of cerebral infarction: Secondary | ICD-10-CM | POA: Diagnosis not present

## 2022-05-31 DIAGNOSIS — G928 Other toxic encephalopathy: Secondary | ICD-10-CM | POA: Diagnosis not present

## 2022-05-31 DIAGNOSIS — I69318 Other symptoms and signs involving cognitive functions following cerebral infarction: Secondary | ICD-10-CM | POA: Diagnosis not present

## 2022-05-31 DIAGNOSIS — J9611 Chronic respiratory failure with hypoxia: Secondary | ICD-10-CM | POA: Diagnosis not present

## 2022-05-31 DIAGNOSIS — G40909 Epilepsy, unspecified, not intractable, without status epilepticus: Secondary | ICD-10-CM | POA: Diagnosis not present

## 2022-05-31 DIAGNOSIS — B9620 Unspecified Escherichia coli [E. coli] as the cause of diseases classified elsewhere: Secondary | ICD-10-CM | POA: Diagnosis not present

## 2022-06-05 DIAGNOSIS — I69318 Other symptoms and signs involving cognitive functions following cerebral infarction: Secondary | ICD-10-CM | POA: Diagnosis not present

## 2022-06-05 DIAGNOSIS — I69398 Other sequelae of cerebral infarction: Secondary | ICD-10-CM | POA: Diagnosis not present

## 2022-06-05 DIAGNOSIS — G40909 Epilepsy, unspecified, not intractable, without status epilepticus: Secondary | ICD-10-CM | POA: Diagnosis not present

## 2022-06-05 DIAGNOSIS — B9620 Unspecified Escherichia coli [E. coli] as the cause of diseases classified elsewhere: Secondary | ICD-10-CM | POA: Diagnosis not present

## 2022-06-05 DIAGNOSIS — G928 Other toxic encephalopathy: Secondary | ICD-10-CM | POA: Diagnosis not present

## 2022-06-05 DIAGNOSIS — J9611 Chronic respiratory failure with hypoxia: Secondary | ICD-10-CM | POA: Diagnosis not present

## 2022-06-06 DIAGNOSIS — J9611 Chronic respiratory failure with hypoxia: Secondary | ICD-10-CM | POA: Diagnosis not present

## 2022-06-06 DIAGNOSIS — B9620 Unspecified Escherichia coli [E. coli] as the cause of diseases classified elsewhere: Secondary | ICD-10-CM | POA: Diagnosis not present

## 2022-06-06 DIAGNOSIS — I69398 Other sequelae of cerebral infarction: Secondary | ICD-10-CM | POA: Diagnosis not present

## 2022-06-06 DIAGNOSIS — G40909 Epilepsy, unspecified, not intractable, without status epilepticus: Secondary | ICD-10-CM | POA: Diagnosis not present

## 2022-06-06 DIAGNOSIS — I69318 Other symptoms and signs involving cognitive functions following cerebral infarction: Secondary | ICD-10-CM | POA: Diagnosis not present

## 2022-06-06 DIAGNOSIS — G928 Other toxic encephalopathy: Secondary | ICD-10-CM | POA: Diagnosis not present

## 2022-06-07 DIAGNOSIS — I69398 Other sequelae of cerebral infarction: Secondary | ICD-10-CM | POA: Diagnosis not present

## 2022-06-07 DIAGNOSIS — B9620 Unspecified Escherichia coli [E. coli] as the cause of diseases classified elsewhere: Secondary | ICD-10-CM | POA: Diagnosis not present

## 2022-06-07 DIAGNOSIS — G40909 Epilepsy, unspecified, not intractable, without status epilepticus: Secondary | ICD-10-CM | POA: Diagnosis not present

## 2022-06-07 DIAGNOSIS — I69318 Other symptoms and signs involving cognitive functions following cerebral infarction: Secondary | ICD-10-CM | POA: Diagnosis not present

## 2022-06-07 DIAGNOSIS — J9611 Chronic respiratory failure with hypoxia: Secondary | ICD-10-CM | POA: Diagnosis not present

## 2022-06-07 DIAGNOSIS — G928 Other toxic encephalopathy: Secondary | ICD-10-CM | POA: Diagnosis not present

## 2022-06-10 DIAGNOSIS — E039 Hypothyroidism, unspecified: Secondary | ICD-10-CM | POA: Diagnosis not present

## 2022-06-10 DIAGNOSIS — E119 Type 2 diabetes mellitus without complications: Secondary | ICD-10-CM | POA: Diagnosis not present

## 2022-06-10 DIAGNOSIS — I69318 Other symptoms and signs involving cognitive functions following cerebral infarction: Secondary | ICD-10-CM | POA: Diagnosis not present

## 2022-06-10 DIAGNOSIS — E43 Unspecified severe protein-calorie malnutrition: Secondary | ICD-10-CM | POA: Diagnosis not present

## 2022-06-10 DIAGNOSIS — E785 Hyperlipidemia, unspecified: Secondary | ICD-10-CM | POA: Diagnosis not present

## 2022-06-10 DIAGNOSIS — D696 Thrombocytopenia, unspecified: Secondary | ICD-10-CM | POA: Diagnosis not present

## 2022-06-10 DIAGNOSIS — Z853 Personal history of malignant neoplasm of breast: Secondary | ICD-10-CM | POA: Diagnosis not present

## 2022-06-10 DIAGNOSIS — Z8616 Personal history of COVID-19: Secondary | ICD-10-CM | POA: Diagnosis not present

## 2022-06-10 DIAGNOSIS — F32A Depression, unspecified: Secondary | ICD-10-CM | POA: Diagnosis not present

## 2022-06-10 DIAGNOSIS — D649 Anemia, unspecified: Secondary | ICD-10-CM | POA: Diagnosis not present

## 2022-06-10 DIAGNOSIS — Z6826 Body mass index (BMI) 26.0-26.9, adult: Secondary | ICD-10-CM | POA: Diagnosis not present

## 2022-06-10 DIAGNOSIS — Z741 Need for assistance with personal care: Secondary | ICD-10-CM | POA: Diagnosis not present

## 2022-06-10 DIAGNOSIS — I69398 Other sequelae of cerebral infarction: Secondary | ICD-10-CM | POA: Diagnosis not present

## 2022-06-10 DIAGNOSIS — B9620 Unspecified Escherichia coli [E. coli] as the cause of diseases classified elsewhere: Secondary | ICD-10-CM | POA: Diagnosis not present

## 2022-06-10 DIAGNOSIS — Z9981 Dependence on supplemental oxygen: Secondary | ICD-10-CM | POA: Diagnosis not present

## 2022-06-10 DIAGNOSIS — J9611 Chronic respiratory failure with hypoxia: Secondary | ICD-10-CM | POA: Diagnosis not present

## 2022-06-10 DIAGNOSIS — I10 Essential (primary) hypertension: Secondary | ICD-10-CM | POA: Diagnosis not present

## 2022-06-10 DIAGNOSIS — M5000 Cervical disc disorder with myelopathy, unspecified cervical region: Secondary | ICD-10-CM | POA: Diagnosis not present

## 2022-06-10 DIAGNOSIS — H1132 Conjunctival hemorrhage, left eye: Secondary | ICD-10-CM | POA: Diagnosis not present

## 2022-06-10 DIAGNOSIS — Z87440 Personal history of urinary (tract) infections: Secondary | ICD-10-CM | POA: Diagnosis not present

## 2022-06-10 DIAGNOSIS — R32 Unspecified urinary incontinence: Secondary | ICD-10-CM | POA: Diagnosis not present

## 2022-06-10 DIAGNOSIS — D709 Neutropenia, unspecified: Secondary | ICD-10-CM | POA: Diagnosis not present

## 2022-06-10 DIAGNOSIS — R159 Full incontinence of feces: Secondary | ICD-10-CM | POA: Diagnosis not present

## 2022-06-10 DIAGNOSIS — G928 Other toxic encephalopathy: Secondary | ICD-10-CM | POA: Diagnosis not present

## 2022-06-10 DIAGNOSIS — G40909 Epilepsy, unspecified, not intractable, without status epilepticus: Secondary | ICD-10-CM | POA: Diagnosis not present

## 2022-06-12 DIAGNOSIS — G40909 Epilepsy, unspecified, not intractable, without status epilepticus: Secondary | ICD-10-CM | POA: Diagnosis not present

## 2022-06-12 DIAGNOSIS — J9611 Chronic respiratory failure with hypoxia: Secondary | ICD-10-CM | POA: Diagnosis not present

## 2022-06-12 DIAGNOSIS — I69318 Other symptoms and signs involving cognitive functions following cerebral infarction: Secondary | ICD-10-CM | POA: Diagnosis not present

## 2022-06-12 DIAGNOSIS — B9620 Unspecified Escherichia coli [E. coli] as the cause of diseases classified elsewhere: Secondary | ICD-10-CM | POA: Diagnosis not present

## 2022-06-12 DIAGNOSIS — G928 Other toxic encephalopathy: Secondary | ICD-10-CM | POA: Diagnosis not present

## 2022-06-12 DIAGNOSIS — I69398 Other sequelae of cerebral infarction: Secondary | ICD-10-CM | POA: Diagnosis not present

## 2022-06-13 DIAGNOSIS — I69398 Other sequelae of cerebral infarction: Secondary | ICD-10-CM | POA: Diagnosis not present

## 2022-06-13 DIAGNOSIS — J9611 Chronic respiratory failure with hypoxia: Secondary | ICD-10-CM | POA: Diagnosis not present

## 2022-06-13 DIAGNOSIS — G40909 Epilepsy, unspecified, not intractable, without status epilepticus: Secondary | ICD-10-CM | POA: Diagnosis not present

## 2022-06-13 DIAGNOSIS — B9620 Unspecified Escherichia coli [E. coli] as the cause of diseases classified elsewhere: Secondary | ICD-10-CM | POA: Diagnosis not present

## 2022-06-13 DIAGNOSIS — I69318 Other symptoms and signs involving cognitive functions following cerebral infarction: Secondary | ICD-10-CM | POA: Diagnosis not present

## 2022-06-13 DIAGNOSIS — G928 Other toxic encephalopathy: Secondary | ICD-10-CM | POA: Diagnosis not present

## 2022-06-14 DIAGNOSIS — B9620 Unspecified Escherichia coli [E. coli] as the cause of diseases classified elsewhere: Secondary | ICD-10-CM | POA: Diagnosis not present

## 2022-06-14 DIAGNOSIS — I69398 Other sequelae of cerebral infarction: Secondary | ICD-10-CM | POA: Diagnosis not present

## 2022-06-14 DIAGNOSIS — J9611 Chronic respiratory failure with hypoxia: Secondary | ICD-10-CM | POA: Diagnosis not present

## 2022-06-14 DIAGNOSIS — I69318 Other symptoms and signs involving cognitive functions following cerebral infarction: Secondary | ICD-10-CM | POA: Diagnosis not present

## 2022-06-14 DIAGNOSIS — G40909 Epilepsy, unspecified, not intractable, without status epilepticus: Secondary | ICD-10-CM | POA: Diagnosis not present

## 2022-06-14 DIAGNOSIS — G928 Other toxic encephalopathy: Secondary | ICD-10-CM | POA: Diagnosis not present

## 2022-06-19 DIAGNOSIS — G40909 Epilepsy, unspecified, not intractable, without status epilepticus: Secondary | ICD-10-CM | POA: Diagnosis not present

## 2022-06-19 DIAGNOSIS — G928 Other toxic encephalopathy: Secondary | ICD-10-CM | POA: Diagnosis not present

## 2022-06-19 DIAGNOSIS — I69398 Other sequelae of cerebral infarction: Secondary | ICD-10-CM | POA: Diagnosis not present

## 2022-06-19 DIAGNOSIS — J9611 Chronic respiratory failure with hypoxia: Secondary | ICD-10-CM | POA: Diagnosis not present

## 2022-06-19 DIAGNOSIS — I69318 Other symptoms and signs involving cognitive functions following cerebral infarction: Secondary | ICD-10-CM | POA: Diagnosis not present

## 2022-06-19 DIAGNOSIS — B9620 Unspecified Escherichia coli [E. coli] as the cause of diseases classified elsewhere: Secondary | ICD-10-CM | POA: Diagnosis not present

## 2022-06-20 DIAGNOSIS — E559 Vitamin D deficiency, unspecified: Secondary | ICD-10-CM | POA: Diagnosis not present

## 2022-06-20 DIAGNOSIS — G40909 Epilepsy, unspecified, not intractable, without status epilepticus: Secondary | ICD-10-CM | POA: Diagnosis not present

## 2022-06-20 DIAGNOSIS — E119 Type 2 diabetes mellitus without complications: Secondary | ICD-10-CM | POA: Diagnosis not present

## 2022-06-20 DIAGNOSIS — G928 Other toxic encephalopathy: Secondary | ICD-10-CM | POA: Diagnosis not present

## 2022-06-20 DIAGNOSIS — B9620 Unspecified Escherichia coli [E. coli] as the cause of diseases classified elsewhere: Secondary | ICD-10-CM | POA: Diagnosis not present

## 2022-06-20 DIAGNOSIS — G459 Transient cerebral ischemic attack, unspecified: Secondary | ICD-10-CM | POA: Diagnosis not present

## 2022-06-20 DIAGNOSIS — E782 Mixed hyperlipidemia: Secondary | ICD-10-CM | POA: Diagnosis not present

## 2022-06-20 DIAGNOSIS — I69398 Other sequelae of cerebral infarction: Secondary | ICD-10-CM | POA: Diagnosis not present

## 2022-06-20 DIAGNOSIS — D518 Other vitamin B12 deficiency anemias: Secondary | ICD-10-CM | POA: Diagnosis not present

## 2022-06-20 DIAGNOSIS — J9611 Chronic respiratory failure with hypoxia: Secondary | ICD-10-CM | POA: Diagnosis not present

## 2022-06-20 DIAGNOSIS — I69318 Other symptoms and signs involving cognitive functions following cerebral infarction: Secondary | ICD-10-CM | POA: Diagnosis not present

## 2022-06-20 DIAGNOSIS — E038 Other specified hypothyroidism: Secondary | ICD-10-CM | POA: Diagnosis not present

## 2022-06-20 DIAGNOSIS — I10 Essential (primary) hypertension: Secondary | ICD-10-CM | POA: Diagnosis not present

## 2022-06-21 DIAGNOSIS — R627 Adult failure to thrive: Secondary | ICD-10-CM | POA: Diagnosis not present

## 2022-06-21 DIAGNOSIS — E039 Hypothyroidism, unspecified: Secondary | ICD-10-CM | POA: Diagnosis not present

## 2022-06-21 DIAGNOSIS — E119 Type 2 diabetes mellitus without complications: Secondary | ICD-10-CM | POA: Diagnosis not present

## 2022-06-21 DIAGNOSIS — B9620 Unspecified Escherichia coli [E. coli] as the cause of diseases classified elsewhere: Secondary | ICD-10-CM | POA: Diagnosis not present

## 2022-06-21 DIAGNOSIS — R42 Dizziness and giddiness: Secondary | ICD-10-CM | POA: Diagnosis not present

## 2022-06-21 DIAGNOSIS — E559 Vitamin D deficiency, unspecified: Secondary | ICD-10-CM | POA: Diagnosis not present

## 2022-06-21 DIAGNOSIS — I10 Essential (primary) hypertension: Secondary | ICD-10-CM | POA: Diagnosis not present

## 2022-06-21 DIAGNOSIS — I69398 Other sequelae of cerebral infarction: Secondary | ICD-10-CM | POA: Diagnosis not present

## 2022-06-21 DIAGNOSIS — I69318 Other symptoms and signs involving cognitive functions following cerebral infarction: Secondary | ICD-10-CM | POA: Diagnosis not present

## 2022-06-21 DIAGNOSIS — J9611 Chronic respiratory failure with hypoxia: Secondary | ICD-10-CM | POA: Diagnosis not present

## 2022-06-21 DIAGNOSIS — G40909 Epilepsy, unspecified, not intractable, without status epilepticus: Secondary | ICD-10-CM | POA: Diagnosis not present

## 2022-06-21 DIAGNOSIS — D649 Anemia, unspecified: Secondary | ICD-10-CM | POA: Diagnosis not present

## 2022-06-21 DIAGNOSIS — I447 Left bundle-branch block, unspecified: Secondary | ICD-10-CM | POA: Diagnosis not present

## 2022-06-21 DIAGNOSIS — G928 Other toxic encephalopathy: Secondary | ICD-10-CM | POA: Diagnosis not present

## 2022-06-21 DIAGNOSIS — G459 Transient cerebral ischemic attack, unspecified: Secondary | ICD-10-CM | POA: Diagnosis not present

## 2022-06-26 DIAGNOSIS — J9611 Chronic respiratory failure with hypoxia: Secondary | ICD-10-CM | POA: Diagnosis not present

## 2022-06-26 DIAGNOSIS — G928 Other toxic encephalopathy: Secondary | ICD-10-CM | POA: Diagnosis not present

## 2022-06-26 DIAGNOSIS — I69398 Other sequelae of cerebral infarction: Secondary | ICD-10-CM | POA: Diagnosis not present

## 2022-06-26 DIAGNOSIS — B9620 Unspecified Escherichia coli [E. coli] as the cause of diseases classified elsewhere: Secondary | ICD-10-CM | POA: Diagnosis not present

## 2022-06-26 DIAGNOSIS — I69318 Other symptoms and signs involving cognitive functions following cerebral infarction: Secondary | ICD-10-CM | POA: Diagnosis not present

## 2022-06-26 DIAGNOSIS — G40909 Epilepsy, unspecified, not intractable, without status epilepticus: Secondary | ICD-10-CM | POA: Diagnosis not present

## 2022-06-27 ENCOUNTER — Telehealth: Payer: Self-pay

## 2022-06-27 DIAGNOSIS — E039 Hypothyroidism, unspecified: Secondary | ICD-10-CM | POA: Diagnosis not present

## 2022-06-27 DIAGNOSIS — A419 Sepsis, unspecified organism: Secondary | ICD-10-CM | POA: Diagnosis not present

## 2022-06-27 DIAGNOSIS — G40909 Epilepsy, unspecified, not intractable, without status epilepticus: Secondary | ICD-10-CM | POA: Diagnosis not present

## 2022-06-27 DIAGNOSIS — I69318 Other symptoms and signs involving cognitive functions following cerebral infarction: Secondary | ICD-10-CM | POA: Diagnosis not present

## 2022-06-27 DIAGNOSIS — Z7984 Long term (current) use of oral hypoglycemic drugs: Secondary | ICD-10-CM | POA: Diagnosis not present

## 2022-06-27 DIAGNOSIS — R531 Weakness: Secondary | ICD-10-CM | POA: Diagnosis not present

## 2022-06-27 DIAGNOSIS — J9611 Chronic respiratory failure with hypoxia: Secondary | ICD-10-CM | POA: Diagnosis not present

## 2022-06-27 DIAGNOSIS — B9620 Unspecified Escherichia coli [E. coli] as the cause of diseases classified elsewhere: Secondary | ICD-10-CM | POA: Diagnosis not present

## 2022-06-27 DIAGNOSIS — E1151 Type 2 diabetes mellitus with diabetic peripheral angiopathy without gangrene: Secondary | ICD-10-CM | POA: Diagnosis not present

## 2022-06-27 DIAGNOSIS — N3000 Acute cystitis without hematuria: Secondary | ICD-10-CM | POA: Diagnosis not present

## 2022-06-27 DIAGNOSIS — I959 Hypotension, unspecified: Secondary | ICD-10-CM | POA: Diagnosis not present

## 2022-06-27 DIAGNOSIS — N390 Urinary tract infection, site not specified: Secondary | ICD-10-CM | POA: Diagnosis not present

## 2022-06-27 DIAGNOSIS — R6521 Severe sepsis with septic shock: Secondary | ICD-10-CM | POA: Diagnosis not present

## 2022-06-27 DIAGNOSIS — E162 Hypoglycemia, unspecified: Secondary | ICD-10-CM | POA: Diagnosis not present

## 2022-06-27 DIAGNOSIS — R410 Disorientation, unspecified: Secondary | ICD-10-CM | POA: Diagnosis not present

## 2022-06-27 DIAGNOSIS — Z8673 Personal history of transient ischemic attack (TIA), and cerebral infarction without residual deficits: Secondary | ICD-10-CM | POA: Diagnosis not present

## 2022-06-27 DIAGNOSIS — E161 Other hypoglycemia: Secondary | ICD-10-CM | POA: Diagnosis not present

## 2022-06-27 DIAGNOSIS — I10 Essential (primary) hypertension: Secondary | ICD-10-CM | POA: Diagnosis not present

## 2022-06-27 DIAGNOSIS — G9341 Metabolic encephalopathy: Secondary | ICD-10-CM | POA: Diagnosis not present

## 2022-06-27 DIAGNOSIS — I739 Peripheral vascular disease, unspecified: Secondary | ICD-10-CM | POA: Diagnosis not present

## 2022-06-27 DIAGNOSIS — I69398 Other sequelae of cerebral infarction: Secondary | ICD-10-CM | POA: Diagnosis not present

## 2022-06-27 DIAGNOSIS — G928 Other toxic encephalopathy: Secondary | ICD-10-CM | POA: Diagnosis not present

## 2022-06-27 DIAGNOSIS — R4182 Altered mental status, unspecified: Secondary | ICD-10-CM | POA: Diagnosis not present

## 2022-06-27 DIAGNOSIS — Z20822 Contact with and (suspected) exposure to covid-19: Secondary | ICD-10-CM | POA: Diagnosis not present

## 2022-06-27 NOTE — Patient Outreach (Signed)
  Care Coordination   Initial Visit Note   06/27/2022 Name: Anna Hunt MRN: [id number] DOB: Aug 21, 1946  Anna Hunt is a 76 y.o. year old female who sees Lawerance Cruel, MD for primary care. I spoke with  Anna Hunt 's daughter/POA, Anna Hunt by phone today.  What matters to the patients health and wellness today?  No Concerns Expressed. Reports patient has transitioned to an [place]   None     SDOH assessments and interventions completed:  No     Care Coordination Interventions Activated:  No  Care Coordination Interventions:  No, not indicated   Follow up plan: No further intervention required. Daughter reports patient is currently residing at [facility]  Encounter Outcome:  Pt. Visit Completed   [place] [facility] [phone]

## 2022-06-28 DIAGNOSIS — G9341 Metabolic encephalopathy: Secondary | ICD-10-CM | POA: Diagnosis not present

## 2022-06-28 DIAGNOSIS — G928 Other toxic encephalopathy: Secondary | ICD-10-CM | POA: Diagnosis not present

## 2022-06-28 DIAGNOSIS — N179 Acute kidney failure, unspecified: Secondary | ICD-10-CM | POA: Diagnosis not present

## 2022-06-28 DIAGNOSIS — N390 Urinary tract infection, site not specified: Secondary | ICD-10-CM | POA: Diagnosis not present

## 2022-06-28 DIAGNOSIS — F0390 Unspecified dementia without behavioral disturbance: Secondary | ICD-10-CM | POA: Diagnosis not present

## 2022-06-28 DIAGNOSIS — I739 Peripheral vascular disease, unspecified: Secondary | ICD-10-CM | POA: Diagnosis not present

## 2022-06-28 DIAGNOSIS — B9620 Unspecified Escherichia coli [E. coli] as the cause of diseases classified elsewhere: Secondary | ICD-10-CM | POA: Diagnosis not present

## 2022-06-28 DIAGNOSIS — J9611 Chronic respiratory failure with hypoxia: Secondary | ICD-10-CM | POA: Diagnosis not present

## 2022-06-28 DIAGNOSIS — I69398 Other sequelae of cerebral infarction: Secondary | ICD-10-CM | POA: Diagnosis not present

## 2022-06-28 DIAGNOSIS — A419 Sepsis, unspecified organism: Secondary | ICD-10-CM | POA: Diagnosis not present

## 2022-06-28 DIAGNOSIS — E119 Type 2 diabetes mellitus without complications: Secondary | ICD-10-CM | POA: Diagnosis not present

## 2022-06-28 DIAGNOSIS — R6521 Severe sepsis with septic shock: Secondary | ICD-10-CM | POA: Diagnosis not present

## 2022-06-28 DIAGNOSIS — I447 Left bundle-branch block, unspecified: Secondary | ICD-10-CM | POA: Diagnosis not present

## 2022-06-28 DIAGNOSIS — I69318 Other symptoms and signs involving cognitive functions following cerebral infarction: Secondary | ICD-10-CM | POA: Diagnosis not present

## 2022-06-28 DIAGNOSIS — I10 Essential (primary) hypertension: Secondary | ICD-10-CM | POA: Diagnosis not present

## 2022-06-28 DIAGNOSIS — E039 Hypothyroidism, unspecified: Secondary | ICD-10-CM | POA: Diagnosis not present

## 2022-06-28 DIAGNOSIS — Z8673 Personal history of transient ischemic attack (TIA), and cerebral infarction without residual deficits: Secondary | ICD-10-CM | POA: Diagnosis not present

## 2022-06-28 DIAGNOSIS — G40909 Epilepsy, unspecified, not intractable, without status epilepticus: Secondary | ICD-10-CM | POA: Diagnosis not present

## 2022-06-29 DIAGNOSIS — F32A Depression, unspecified: Secondary | ICD-10-CM | POA: Diagnosis not present

## 2022-06-29 DIAGNOSIS — N3289 Other specified disorders of bladder: Secondary | ICD-10-CM | POA: Diagnosis not present

## 2022-06-29 DIAGNOSIS — F0390 Unspecified dementia without behavioral disturbance: Secondary | ICD-10-CM | POA: Diagnosis not present

## 2022-06-29 DIAGNOSIS — I69398 Other sequelae of cerebral infarction: Secondary | ICD-10-CM | POA: Diagnosis not present

## 2022-06-29 DIAGNOSIS — E119 Type 2 diabetes mellitus without complications: Secondary | ICD-10-CM | POA: Diagnosis not present

## 2022-06-29 DIAGNOSIS — A419 Sepsis, unspecified organism: Secondary | ICD-10-CM | POA: Diagnosis not present

## 2022-06-29 DIAGNOSIS — R6521 Severe sepsis with septic shock: Secondary | ICD-10-CM | POA: Diagnosis not present

## 2022-06-29 DIAGNOSIS — I5020 Unspecified systolic (congestive) heart failure: Secondary | ICD-10-CM | POA: Diagnosis not present

## 2022-06-29 DIAGNOSIS — J9611 Chronic respiratory failure with hypoxia: Secondary | ICD-10-CM | POA: Diagnosis not present

## 2022-06-29 DIAGNOSIS — N390 Urinary tract infection, site not specified: Secondary | ICD-10-CM | POA: Diagnosis not present

## 2022-06-29 DIAGNOSIS — B9620 Unspecified Escherichia coli [E. coli] as the cause of diseases classified elsewhere: Secondary | ICD-10-CM | POA: Diagnosis not present

## 2022-06-29 DIAGNOSIS — G928 Other toxic encephalopathy: Secondary | ICD-10-CM | POA: Diagnosis not present

## 2022-06-29 DIAGNOSIS — G40909 Epilepsy, unspecified, not intractable, without status epilepticus: Secondary | ICD-10-CM | POA: Diagnosis not present

## 2022-06-29 DIAGNOSIS — E039 Hypothyroidism, unspecified: Secondary | ICD-10-CM | POA: Diagnosis not present

## 2022-06-29 DIAGNOSIS — I959 Hypotension, unspecified: Secondary | ICD-10-CM | POA: Diagnosis not present

## 2022-06-29 DIAGNOSIS — I110 Hypertensive heart disease with heart failure: Secondary | ICD-10-CM | POA: Diagnosis not present

## 2022-06-29 DIAGNOSIS — I69318 Other symptoms and signs involving cognitive functions following cerebral infarction: Secondary | ICD-10-CM | POA: Diagnosis not present

## 2022-06-29 DIAGNOSIS — Z8673 Personal history of transient ischemic attack (TIA), and cerebral infarction without residual deficits: Secondary | ICD-10-CM | POA: Diagnosis not present

## 2022-06-30 DIAGNOSIS — G40909 Epilepsy, unspecified, not intractable, without status epilepticus: Secondary | ICD-10-CM | POA: Diagnosis not present

## 2022-06-30 DIAGNOSIS — R6521 Severe sepsis with septic shock: Secondary | ICD-10-CM | POA: Diagnosis not present

## 2022-06-30 DIAGNOSIS — J9611 Chronic respiratory failure with hypoxia: Secondary | ICD-10-CM | POA: Diagnosis not present

## 2022-06-30 DIAGNOSIS — R5381 Other malaise: Secondary | ICD-10-CM | POA: Diagnosis not present

## 2022-06-30 DIAGNOSIS — A419 Sepsis, unspecified organism: Secondary | ICD-10-CM | POA: Diagnosis not present

## 2022-06-30 DIAGNOSIS — G928 Other toxic encephalopathy: Secondary | ICD-10-CM | POA: Diagnosis not present

## 2022-06-30 DIAGNOSIS — N200 Calculus of kidney: Secondary | ICD-10-CM | POA: Diagnosis not present

## 2022-06-30 DIAGNOSIS — I69318 Other symptoms and signs involving cognitive functions following cerebral infarction: Secondary | ICD-10-CM | POA: Diagnosis not present

## 2022-06-30 DIAGNOSIS — N390 Urinary tract infection, site not specified: Secondary | ICD-10-CM | POA: Diagnosis not present

## 2022-06-30 DIAGNOSIS — F0390 Unspecified dementia without behavioral disturbance: Secondary | ICD-10-CM | POA: Diagnosis not present

## 2022-06-30 DIAGNOSIS — B9620 Unspecified Escherichia coli [E. coli] as the cause of diseases classified elsewhere: Secondary | ICD-10-CM | POA: Diagnosis not present

## 2022-06-30 DIAGNOSIS — I081 Rheumatic disorders of both mitral and tricuspid valves: Secondary | ICD-10-CM | POA: Diagnosis not present

## 2022-06-30 DIAGNOSIS — I69398 Other sequelae of cerebral infarction: Secondary | ICD-10-CM | POA: Diagnosis not present

## 2022-07-01 DIAGNOSIS — B9620 Unspecified Escherichia coli [E. coli] as the cause of diseases classified elsewhere: Secondary | ICD-10-CM | POA: Diagnosis not present

## 2022-07-01 DIAGNOSIS — G928 Other toxic encephalopathy: Secondary | ICD-10-CM | POA: Diagnosis not present

## 2022-07-01 DIAGNOSIS — R5381 Other malaise: Secondary | ICD-10-CM | POA: Diagnosis not present

## 2022-07-01 DIAGNOSIS — A419 Sepsis, unspecified organism: Secondary | ICD-10-CM | POA: Diagnosis not present

## 2022-07-01 DIAGNOSIS — G40909 Epilepsy, unspecified, not intractable, without status epilepticus: Secondary | ICD-10-CM | POA: Diagnosis not present

## 2022-07-01 DIAGNOSIS — N390 Urinary tract infection, site not specified: Secondary | ICD-10-CM | POA: Diagnosis not present

## 2022-07-01 DIAGNOSIS — R6521 Severe sepsis with septic shock: Secondary | ICD-10-CM | POA: Diagnosis not present

## 2022-07-01 DIAGNOSIS — I69318 Other symptoms and signs involving cognitive functions following cerebral infarction: Secondary | ICD-10-CM | POA: Diagnosis not present

## 2022-07-01 DIAGNOSIS — J9611 Chronic respiratory failure with hypoxia: Secondary | ICD-10-CM | POA: Diagnosis not present

## 2022-07-01 DIAGNOSIS — N200 Calculus of kidney: Secondary | ICD-10-CM | POA: Diagnosis not present

## 2022-07-01 DIAGNOSIS — F0390 Unspecified dementia without behavioral disturbance: Secondary | ICD-10-CM | POA: Diagnosis not present

## 2022-07-01 DIAGNOSIS — I69398 Other sequelae of cerebral infarction: Secondary | ICD-10-CM | POA: Diagnosis not present

## 2022-07-02 DIAGNOSIS — B9620 Unspecified Escherichia coli [E. coli] as the cause of diseases classified elsewhere: Secondary | ICD-10-CM | POA: Diagnosis not present

## 2022-07-02 DIAGNOSIS — F32A Depression, unspecified: Secondary | ICD-10-CM | POA: Diagnosis not present

## 2022-07-02 DIAGNOSIS — J9611 Chronic respiratory failure with hypoxia: Secondary | ICD-10-CM | POA: Diagnosis not present

## 2022-07-02 DIAGNOSIS — I5020 Unspecified systolic (congestive) heart failure: Secondary | ICD-10-CM | POA: Diagnosis not present

## 2022-07-02 DIAGNOSIS — F0390 Unspecified dementia without behavioral disturbance: Secondary | ICD-10-CM | POA: Diagnosis not present

## 2022-07-02 DIAGNOSIS — I69318 Other symptoms and signs involving cognitive functions following cerebral infarction: Secondary | ICD-10-CM | POA: Diagnosis not present

## 2022-07-02 DIAGNOSIS — E039 Hypothyroidism, unspecified: Secondary | ICD-10-CM | POA: Diagnosis not present

## 2022-07-02 DIAGNOSIS — R6521 Severe sepsis with septic shock: Secondary | ICD-10-CM | POA: Diagnosis not present

## 2022-07-02 DIAGNOSIS — G40909 Epilepsy, unspecified, not intractable, without status epilepticus: Secondary | ICD-10-CM | POA: Diagnosis not present

## 2022-07-02 DIAGNOSIS — I69398 Other sequelae of cerebral infarction: Secondary | ICD-10-CM | POA: Diagnosis not present

## 2022-07-02 DIAGNOSIS — A419 Sepsis, unspecified organism: Secondary | ICD-10-CM | POA: Diagnosis not present

## 2022-07-02 DIAGNOSIS — G928 Other toxic encephalopathy: Secondary | ICD-10-CM | POA: Diagnosis not present

## 2022-07-02 DIAGNOSIS — E119 Type 2 diabetes mellitus without complications: Secondary | ICD-10-CM | POA: Diagnosis not present

## 2022-07-02 DIAGNOSIS — I110 Hypertensive heart disease with heart failure: Secondary | ICD-10-CM | POA: Diagnosis not present

## 2022-07-02 DIAGNOSIS — N390 Urinary tract infection, site not specified: Secondary | ICD-10-CM | POA: Diagnosis not present

## 2022-07-02 DIAGNOSIS — I959 Hypotension, unspecified: Secondary | ICD-10-CM | POA: Diagnosis not present

## 2022-07-02 DIAGNOSIS — Z8673 Personal history of transient ischemic attack (TIA), and cerebral infarction without residual deficits: Secondary | ICD-10-CM | POA: Diagnosis not present

## 2022-07-03 DIAGNOSIS — Z853 Personal history of malignant neoplasm of breast: Secondary | ICD-10-CM | POA: Diagnosis not present

## 2022-07-03 DIAGNOSIS — E119 Type 2 diabetes mellitus without complications: Secondary | ICD-10-CM | POA: Diagnosis not present

## 2022-07-03 DIAGNOSIS — A419 Sepsis, unspecified organism: Secondary | ICD-10-CM | POA: Diagnosis not present

## 2022-07-03 DIAGNOSIS — I69398 Other sequelae of cerebral infarction: Secondary | ICD-10-CM | POA: Diagnosis not present

## 2022-07-03 DIAGNOSIS — Z8673 Personal history of transient ischemic attack (TIA), and cerebral infarction without residual deficits: Secondary | ICD-10-CM | POA: Diagnosis not present

## 2022-07-03 DIAGNOSIS — F32A Depression, unspecified: Secondary | ICD-10-CM | POA: Diagnosis not present

## 2022-07-03 DIAGNOSIS — R6521 Severe sepsis with septic shock: Secondary | ICD-10-CM | POA: Diagnosis not present

## 2022-07-03 DIAGNOSIS — E039 Hypothyroidism, unspecified: Secondary | ICD-10-CM | POA: Diagnosis not present

## 2022-07-03 DIAGNOSIS — I959 Hypotension, unspecified: Secondary | ICD-10-CM | POA: Diagnosis not present

## 2022-07-03 DIAGNOSIS — I5020 Unspecified systolic (congestive) heart failure: Secondary | ICD-10-CM | POA: Diagnosis not present

## 2022-07-03 DIAGNOSIS — B9620 Unspecified Escherichia coli [E. coli] as the cause of diseases classified elsewhere: Secondary | ICD-10-CM | POA: Diagnosis not present

## 2022-07-03 DIAGNOSIS — N390 Urinary tract infection, site not specified: Secondary | ICD-10-CM | POA: Diagnosis not present

## 2022-07-03 DIAGNOSIS — G40909 Epilepsy, unspecified, not intractable, without status epilepticus: Secondary | ICD-10-CM | POA: Diagnosis not present

## 2022-07-03 DIAGNOSIS — J9611 Chronic respiratory failure with hypoxia: Secondary | ICD-10-CM | POA: Diagnosis not present

## 2022-07-03 DIAGNOSIS — F0390 Unspecified dementia without behavioral disturbance: Secondary | ICD-10-CM | POA: Diagnosis not present

## 2022-07-03 DIAGNOSIS — I110 Hypertensive heart disease with heart failure: Secondary | ICD-10-CM | POA: Diagnosis not present

## 2022-07-03 DIAGNOSIS — I69318 Other symptoms and signs involving cognitive functions following cerebral infarction: Secondary | ICD-10-CM | POA: Diagnosis not present

## 2022-07-03 DIAGNOSIS — G928 Other toxic encephalopathy: Secondary | ICD-10-CM | POA: Diagnosis not present

## 2022-07-04 DIAGNOSIS — R6521 Severe sepsis with septic shock: Secondary | ICD-10-CM | POA: Diagnosis not present

## 2022-07-04 DIAGNOSIS — G928 Other toxic encephalopathy: Secondary | ICD-10-CM | POA: Diagnosis not present

## 2022-07-04 DIAGNOSIS — J9611 Chronic respiratory failure with hypoxia: Secondary | ICD-10-CM | POA: Diagnosis not present

## 2022-07-04 DIAGNOSIS — I110 Hypertensive heart disease with heart failure: Secondary | ICD-10-CM | POA: Diagnosis not present

## 2022-07-04 DIAGNOSIS — I69318 Other symptoms and signs involving cognitive functions following cerebral infarction: Secondary | ICD-10-CM | POA: Diagnosis not present

## 2022-07-04 DIAGNOSIS — E039 Hypothyroidism, unspecified: Secondary | ICD-10-CM | POA: Diagnosis not present

## 2022-07-04 DIAGNOSIS — Z8669 Personal history of other diseases of the nervous system and sense organs: Secondary | ICD-10-CM | POA: Diagnosis not present

## 2022-07-04 DIAGNOSIS — Z515 Encounter for palliative care: Secondary | ICD-10-CM | POA: Diagnosis not present

## 2022-07-04 DIAGNOSIS — R410 Disorientation, unspecified: Secondary | ICD-10-CM | POA: Diagnosis not present

## 2022-07-04 DIAGNOSIS — A419 Sepsis, unspecified organism: Secondary | ICD-10-CM | POA: Diagnosis not present

## 2022-07-04 DIAGNOSIS — I5020 Unspecified systolic (congestive) heart failure: Secondary | ICD-10-CM | POA: Diagnosis not present

## 2022-07-04 DIAGNOSIS — F0390 Unspecified dementia without behavioral disturbance: Secondary | ICD-10-CM | POA: Diagnosis not present

## 2022-07-04 DIAGNOSIS — Z8673 Personal history of transient ischemic attack (TIA), and cerebral infarction without residual deficits: Secondary | ICD-10-CM | POA: Diagnosis not present

## 2022-07-04 DIAGNOSIS — N3090 Cystitis, unspecified without hematuria: Secondary | ICD-10-CM | POA: Diagnosis not present

## 2022-07-04 DIAGNOSIS — B9620 Unspecified Escherichia coli [E. coli] as the cause of diseases classified elsewhere: Secondary | ICD-10-CM | POA: Diagnosis not present

## 2022-07-04 DIAGNOSIS — F32A Depression, unspecified: Secondary | ICD-10-CM | POA: Diagnosis not present

## 2022-07-04 DIAGNOSIS — G40909 Epilepsy, unspecified, not intractable, without status epilepticus: Secondary | ICD-10-CM | POA: Diagnosis not present

## 2022-07-04 DIAGNOSIS — I69398 Other sequelae of cerebral infarction: Secondary | ICD-10-CM | POA: Diagnosis not present

## 2022-07-04 DIAGNOSIS — Z743 Need for continuous supervision: Secondary | ICD-10-CM | POA: Diagnosis not present

## 2022-07-05 DIAGNOSIS — B9620 Unspecified Escherichia coli [E. coli] as the cause of diseases classified elsewhere: Secondary | ICD-10-CM | POA: Diagnosis not present

## 2022-07-05 DIAGNOSIS — G40909 Epilepsy, unspecified, not intractable, without status epilepticus: Secondary | ICD-10-CM | POA: Diagnosis not present

## 2022-07-05 DIAGNOSIS — I69398 Other sequelae of cerebral infarction: Secondary | ICD-10-CM | POA: Diagnosis not present

## 2022-07-05 DIAGNOSIS — I69318 Other symptoms and signs involving cognitive functions following cerebral infarction: Secondary | ICD-10-CM | POA: Diagnosis not present

## 2022-07-05 DIAGNOSIS — G928 Other toxic encephalopathy: Secondary | ICD-10-CM | POA: Diagnosis not present

## 2022-07-05 DIAGNOSIS — J9611 Chronic respiratory failure with hypoxia: Secondary | ICD-10-CM | POA: Diagnosis not present

## 2022-07-06 DIAGNOSIS — J9611 Chronic respiratory failure with hypoxia: Secondary | ICD-10-CM | POA: Diagnosis not present

## 2022-07-06 DIAGNOSIS — I69398 Other sequelae of cerebral infarction: Secondary | ICD-10-CM | POA: Diagnosis not present

## 2022-07-06 DIAGNOSIS — I69318 Other symptoms and signs involving cognitive functions following cerebral infarction: Secondary | ICD-10-CM | POA: Diagnosis not present

## 2022-07-06 DIAGNOSIS — G928 Other toxic encephalopathy: Secondary | ICD-10-CM | POA: Diagnosis not present

## 2022-07-06 DIAGNOSIS — B9620 Unspecified Escherichia coli [E. coli] as the cause of diseases classified elsewhere: Secondary | ICD-10-CM | POA: Diagnosis not present

## 2022-07-06 DIAGNOSIS — G40909 Epilepsy, unspecified, not intractable, without status epilepticus: Secondary | ICD-10-CM | POA: Diagnosis not present

## 2022-07-07 DIAGNOSIS — I69398 Other sequelae of cerebral infarction: Secondary | ICD-10-CM | POA: Diagnosis not present

## 2022-07-07 DIAGNOSIS — I69318 Other symptoms and signs involving cognitive functions following cerebral infarction: Secondary | ICD-10-CM | POA: Diagnosis not present

## 2022-07-07 DIAGNOSIS — G928 Other toxic encephalopathy: Secondary | ICD-10-CM | POA: Diagnosis not present

## 2022-07-07 DIAGNOSIS — J9611 Chronic respiratory failure with hypoxia: Secondary | ICD-10-CM | POA: Diagnosis not present

## 2022-07-07 DIAGNOSIS — G40909 Epilepsy, unspecified, not intractable, without status epilepticus: Secondary | ICD-10-CM | POA: Diagnosis not present

## 2022-07-07 DIAGNOSIS — B9620 Unspecified Escherichia coli [E. coli] as the cause of diseases classified elsewhere: Secondary | ICD-10-CM | POA: Diagnosis not present

## 2022-07-08 DIAGNOSIS — I69318 Other symptoms and signs involving cognitive functions following cerebral infarction: Secondary | ICD-10-CM | POA: Diagnosis not present

## 2022-07-08 DIAGNOSIS — B9620 Unspecified Escherichia coli [E. coli] as the cause of diseases classified elsewhere: Secondary | ICD-10-CM | POA: Diagnosis not present

## 2022-07-08 DIAGNOSIS — G40909 Epilepsy, unspecified, not intractable, without status epilepticus: Secondary | ICD-10-CM | POA: Diagnosis not present

## 2022-07-08 DIAGNOSIS — I69398 Other sequelae of cerebral infarction: Secondary | ICD-10-CM | POA: Diagnosis not present

## 2022-07-08 DIAGNOSIS — J9611 Chronic respiratory failure with hypoxia: Secondary | ICD-10-CM | POA: Diagnosis not present

## 2022-07-08 DIAGNOSIS — G928 Other toxic encephalopathy: Secondary | ICD-10-CM | POA: Diagnosis not present

## 2022-07-09 DIAGNOSIS — J9611 Chronic respiratory failure with hypoxia: Secondary | ICD-10-CM | POA: Diagnosis not present

## 2022-07-09 DIAGNOSIS — B9620 Unspecified Escherichia coli [E. coli] as the cause of diseases classified elsewhere: Secondary | ICD-10-CM | POA: Diagnosis not present

## 2022-07-09 DIAGNOSIS — I69318 Other symptoms and signs involving cognitive functions following cerebral infarction: Secondary | ICD-10-CM | POA: Diagnosis not present

## 2022-07-09 DIAGNOSIS — G928 Other toxic encephalopathy: Secondary | ICD-10-CM | POA: Diagnosis not present

## 2022-07-09 DIAGNOSIS — I69398 Other sequelae of cerebral infarction: Secondary | ICD-10-CM | POA: Diagnosis not present

## 2022-07-09 DIAGNOSIS — G40909 Epilepsy, unspecified, not intractable, without status epilepticus: Secondary | ICD-10-CM | POA: Diagnosis not present

## 2022-07-10 DIAGNOSIS — G928 Other toxic encephalopathy: Secondary | ICD-10-CM | POA: Diagnosis not present

## 2022-07-10 DIAGNOSIS — G40909 Epilepsy, unspecified, not intractable, without status epilepticus: Secondary | ICD-10-CM | POA: Diagnosis not present

## 2022-07-10 DIAGNOSIS — I69398 Other sequelae of cerebral infarction: Secondary | ICD-10-CM | POA: Diagnosis not present

## 2022-07-10 DIAGNOSIS — J9611 Chronic respiratory failure with hypoxia: Secondary | ICD-10-CM | POA: Diagnosis not present

## 2022-07-10 DIAGNOSIS — I69318 Other symptoms and signs involving cognitive functions following cerebral infarction: Secondary | ICD-10-CM | POA: Diagnosis not present

## 2022-07-10 DIAGNOSIS — B9620 Unspecified Escherichia coli [E. coli] as the cause of diseases classified elsewhere: Secondary | ICD-10-CM | POA: Diagnosis not present

## 2022-07-11 DIAGNOSIS — H1132 Conjunctival hemorrhage, left eye: Secondary | ICD-10-CM | POA: Diagnosis not present

## 2022-07-11 DIAGNOSIS — I10 Essential (primary) hypertension: Secondary | ICD-10-CM | POA: Diagnosis not present

## 2022-07-11 DIAGNOSIS — Z9981 Dependence on supplemental oxygen: Secondary | ICD-10-CM | POA: Diagnosis not present

## 2022-07-11 DIAGNOSIS — R32 Unspecified urinary incontinence: Secondary | ICD-10-CM | POA: Diagnosis not present

## 2022-07-11 DIAGNOSIS — D696 Thrombocytopenia, unspecified: Secondary | ICD-10-CM | POA: Diagnosis not present

## 2022-07-11 DIAGNOSIS — G928 Other toxic encephalopathy: Secondary | ICD-10-CM | POA: Diagnosis not present

## 2022-07-11 DIAGNOSIS — J9611 Chronic respiratory failure with hypoxia: Secondary | ICD-10-CM | POA: Diagnosis not present

## 2022-07-11 DIAGNOSIS — D649 Anemia, unspecified: Secondary | ICD-10-CM | POA: Diagnosis not present

## 2022-07-11 DIAGNOSIS — E785 Hyperlipidemia, unspecified: Secondary | ICD-10-CM | POA: Diagnosis not present

## 2022-07-11 DIAGNOSIS — R159 Full incontinence of feces: Secondary | ICD-10-CM | POA: Diagnosis not present

## 2022-07-11 DIAGNOSIS — E039 Hypothyroidism, unspecified: Secondary | ICD-10-CM | POA: Diagnosis not present

## 2022-07-11 DIAGNOSIS — Z87440 Personal history of urinary (tract) infections: Secondary | ICD-10-CM | POA: Diagnosis not present

## 2022-07-11 DIAGNOSIS — B9620 Unspecified Escherichia coli [E. coli] as the cause of diseases classified elsewhere: Secondary | ICD-10-CM | POA: Diagnosis not present

## 2022-07-11 DIAGNOSIS — I69318 Other symptoms and signs involving cognitive functions following cerebral infarction: Secondary | ICD-10-CM | POA: Diagnosis not present

## 2022-07-11 DIAGNOSIS — M5000 Cervical disc disorder with myelopathy, unspecified cervical region: Secondary | ICD-10-CM | POA: Diagnosis not present

## 2022-07-11 DIAGNOSIS — F32A Depression, unspecified: Secondary | ICD-10-CM | POA: Diagnosis not present

## 2022-07-11 DIAGNOSIS — E43 Unspecified severe protein-calorie malnutrition: Secondary | ICD-10-CM | POA: Diagnosis not present

## 2022-07-11 DIAGNOSIS — I69398 Other sequelae of cerebral infarction: Secondary | ICD-10-CM | POA: Diagnosis not present

## 2022-07-11 DIAGNOSIS — D709 Neutropenia, unspecified: Secondary | ICD-10-CM | POA: Diagnosis not present

## 2022-07-11 DIAGNOSIS — E119 Type 2 diabetes mellitus without complications: Secondary | ICD-10-CM | POA: Diagnosis not present

## 2022-07-11 DIAGNOSIS — G40909 Epilepsy, unspecified, not intractable, without status epilepticus: Secondary | ICD-10-CM | POA: Diagnosis not present

## 2022-07-11 DIAGNOSIS — Z741 Need for assistance with personal care: Secondary | ICD-10-CM | POA: Diagnosis not present

## 2022-07-11 DIAGNOSIS — Z853 Personal history of malignant neoplasm of breast: Secondary | ICD-10-CM | POA: Diagnosis not present

## 2022-07-11 DIAGNOSIS — Z8616 Personal history of COVID-19: Secondary | ICD-10-CM | POA: Diagnosis not present

## 2022-07-11 DIAGNOSIS — Z6826 Body mass index (BMI) 26.0-26.9, adult: Secondary | ICD-10-CM | POA: Diagnosis not present

## 2022-07-12 DIAGNOSIS — G40909 Epilepsy, unspecified, not intractable, without status epilepticus: Secondary | ICD-10-CM | POA: Diagnosis not present

## 2022-07-12 DIAGNOSIS — J9611 Chronic respiratory failure with hypoxia: Secondary | ICD-10-CM | POA: Diagnosis not present

## 2022-07-12 DIAGNOSIS — I69398 Other sequelae of cerebral infarction: Secondary | ICD-10-CM | POA: Diagnosis not present

## 2022-07-12 DIAGNOSIS — B9620 Unspecified Escherichia coli [E. coli] as the cause of diseases classified elsewhere: Secondary | ICD-10-CM | POA: Diagnosis not present

## 2022-07-12 DIAGNOSIS — I69318 Other symptoms and signs involving cognitive functions following cerebral infarction: Secondary | ICD-10-CM | POA: Diagnosis not present

## 2022-07-12 DIAGNOSIS — G928 Other toxic encephalopathy: Secondary | ICD-10-CM | POA: Diagnosis not present

## 2022-07-13 DIAGNOSIS — B9620 Unspecified Escherichia coli [E. coli] as the cause of diseases classified elsewhere: Secondary | ICD-10-CM | POA: Diagnosis not present

## 2022-07-13 DIAGNOSIS — I69318 Other symptoms and signs involving cognitive functions following cerebral infarction: Secondary | ICD-10-CM | POA: Diagnosis not present

## 2022-07-13 DIAGNOSIS — J9611 Chronic respiratory failure with hypoxia: Secondary | ICD-10-CM | POA: Diagnosis not present

## 2022-07-13 DIAGNOSIS — I69398 Other sequelae of cerebral infarction: Secondary | ICD-10-CM | POA: Diagnosis not present

## 2022-07-13 DIAGNOSIS — G40909 Epilepsy, unspecified, not intractable, without status epilepticus: Secondary | ICD-10-CM | POA: Diagnosis not present

## 2022-07-13 DIAGNOSIS — G928 Other toxic encephalopathy: Secondary | ICD-10-CM | POA: Diagnosis not present

## 2022-07-14 DIAGNOSIS — J9611 Chronic respiratory failure with hypoxia: Secondary | ICD-10-CM | POA: Diagnosis not present

## 2022-07-14 DIAGNOSIS — G40909 Epilepsy, unspecified, not intractable, without status epilepticus: Secondary | ICD-10-CM | POA: Diagnosis not present

## 2022-07-14 DIAGNOSIS — I69398 Other sequelae of cerebral infarction: Secondary | ICD-10-CM | POA: Diagnosis not present

## 2022-07-14 DIAGNOSIS — I69318 Other symptoms and signs involving cognitive functions following cerebral infarction: Secondary | ICD-10-CM | POA: Diagnosis not present

## 2022-07-14 DIAGNOSIS — G928 Other toxic encephalopathy: Secondary | ICD-10-CM | POA: Diagnosis not present

## 2022-07-14 DIAGNOSIS — B9620 Unspecified Escherichia coli [E. coli] as the cause of diseases classified elsewhere: Secondary | ICD-10-CM | POA: Diagnosis not present

## 2022-07-15 DIAGNOSIS — I69398 Other sequelae of cerebral infarction: Secondary | ICD-10-CM | POA: Diagnosis not present

## 2022-07-15 DIAGNOSIS — G40909 Epilepsy, unspecified, not intractable, without status epilepticus: Secondary | ICD-10-CM | POA: Diagnosis not present

## 2022-07-15 DIAGNOSIS — G928 Other toxic encephalopathy: Secondary | ICD-10-CM | POA: Diagnosis not present

## 2022-07-15 DIAGNOSIS — I69318 Other symptoms and signs involving cognitive functions following cerebral infarction: Secondary | ICD-10-CM | POA: Diagnosis not present

## 2022-07-15 DIAGNOSIS — B9620 Unspecified Escherichia coli [E. coli] as the cause of diseases classified elsewhere: Secondary | ICD-10-CM | POA: Diagnosis not present

## 2022-07-15 DIAGNOSIS — J9611 Chronic respiratory failure with hypoxia: Secondary | ICD-10-CM | POA: Diagnosis not present

## 2022-07-16 DIAGNOSIS — I69398 Other sequelae of cerebral infarction: Secondary | ICD-10-CM | POA: Diagnosis not present

## 2022-07-16 DIAGNOSIS — G40909 Epilepsy, unspecified, not intractable, without status epilepticus: Secondary | ICD-10-CM | POA: Diagnosis not present

## 2022-07-16 DIAGNOSIS — G928 Other toxic encephalopathy: Secondary | ICD-10-CM | POA: Diagnosis not present

## 2022-07-16 DIAGNOSIS — B9620 Unspecified Escherichia coli [E. coli] as the cause of diseases classified elsewhere: Secondary | ICD-10-CM | POA: Diagnosis not present

## 2022-07-16 DIAGNOSIS — J9611 Chronic respiratory failure with hypoxia: Secondary | ICD-10-CM | POA: Diagnosis not present

## 2022-07-16 DIAGNOSIS — I69318 Other symptoms and signs involving cognitive functions following cerebral infarction: Secondary | ICD-10-CM | POA: Diagnosis not present

## 2022-07-17 DIAGNOSIS — I69318 Other symptoms and signs involving cognitive functions following cerebral infarction: Secondary | ICD-10-CM | POA: Diagnosis not present

## 2022-07-17 DIAGNOSIS — G40909 Epilepsy, unspecified, not intractable, without status epilepticus: Secondary | ICD-10-CM | POA: Diagnosis not present

## 2022-07-17 DIAGNOSIS — I69398 Other sequelae of cerebral infarction: Secondary | ICD-10-CM | POA: Diagnosis not present

## 2022-07-17 DIAGNOSIS — G928 Other toxic encephalopathy: Secondary | ICD-10-CM | POA: Diagnosis not present

## 2022-07-17 DIAGNOSIS — J9611 Chronic respiratory failure with hypoxia: Secondary | ICD-10-CM | POA: Diagnosis not present

## 2022-07-17 DIAGNOSIS — B9620 Unspecified Escherichia coli [E. coli] as the cause of diseases classified elsewhere: Secondary | ICD-10-CM | POA: Diagnosis not present

## 2022-07-18 DIAGNOSIS — J9611 Chronic respiratory failure with hypoxia: Secondary | ICD-10-CM | POA: Diagnosis not present

## 2022-07-18 DIAGNOSIS — B9620 Unspecified Escherichia coli [E. coli] as the cause of diseases classified elsewhere: Secondary | ICD-10-CM | POA: Diagnosis not present

## 2022-07-18 DIAGNOSIS — I69318 Other symptoms and signs involving cognitive functions following cerebral infarction: Secondary | ICD-10-CM | POA: Diagnosis not present

## 2022-07-18 DIAGNOSIS — G928 Other toxic encephalopathy: Secondary | ICD-10-CM | POA: Diagnosis not present

## 2022-07-18 DIAGNOSIS — I69398 Other sequelae of cerebral infarction: Secondary | ICD-10-CM | POA: Diagnosis not present

## 2022-07-18 DIAGNOSIS — G40909 Epilepsy, unspecified, not intractable, without status epilepticus: Secondary | ICD-10-CM | POA: Diagnosis not present

## 2022-07-19 DIAGNOSIS — J9611 Chronic respiratory failure with hypoxia: Secondary | ICD-10-CM | POA: Diagnosis not present

## 2022-07-19 DIAGNOSIS — G40909 Epilepsy, unspecified, not intractable, without status epilepticus: Secondary | ICD-10-CM | POA: Diagnosis not present

## 2022-07-19 DIAGNOSIS — I69398 Other sequelae of cerebral infarction: Secondary | ICD-10-CM | POA: Diagnosis not present

## 2022-07-19 DIAGNOSIS — B9620 Unspecified Escherichia coli [E. coli] as the cause of diseases classified elsewhere: Secondary | ICD-10-CM | POA: Diagnosis not present

## 2022-07-19 DIAGNOSIS — G928 Other toxic encephalopathy: Secondary | ICD-10-CM | POA: Diagnosis not present

## 2022-07-19 DIAGNOSIS — I69318 Other symptoms and signs involving cognitive functions following cerebral infarction: Secondary | ICD-10-CM | POA: Diagnosis not present

## 2022-07-20 DIAGNOSIS — I69398 Other sequelae of cerebral infarction: Secondary | ICD-10-CM | POA: Diagnosis not present

## 2022-07-20 DIAGNOSIS — J9611 Chronic respiratory failure with hypoxia: Secondary | ICD-10-CM | POA: Diagnosis not present

## 2022-07-20 DIAGNOSIS — G928 Other toxic encephalopathy: Secondary | ICD-10-CM | POA: Diagnosis not present

## 2022-07-20 DIAGNOSIS — B9620 Unspecified Escherichia coli [E. coli] as the cause of diseases classified elsewhere: Secondary | ICD-10-CM | POA: Diagnosis not present

## 2022-07-20 DIAGNOSIS — I69318 Other symptoms and signs involving cognitive functions following cerebral infarction: Secondary | ICD-10-CM | POA: Diagnosis not present

## 2022-07-20 DIAGNOSIS — G40909 Epilepsy, unspecified, not intractable, without status epilepticus: Secondary | ICD-10-CM | POA: Diagnosis not present

## 2022-07-21 DIAGNOSIS — I69318 Other symptoms and signs involving cognitive functions following cerebral infarction: Secondary | ICD-10-CM | POA: Diagnosis not present

## 2022-07-21 DIAGNOSIS — B9620 Unspecified Escherichia coli [E. coli] as the cause of diseases classified elsewhere: Secondary | ICD-10-CM | POA: Diagnosis not present

## 2022-07-21 DIAGNOSIS — G928 Other toxic encephalopathy: Secondary | ICD-10-CM | POA: Diagnosis not present

## 2022-07-21 DIAGNOSIS — J9611 Chronic respiratory failure with hypoxia: Secondary | ICD-10-CM | POA: Diagnosis not present

## 2022-07-21 DIAGNOSIS — G40909 Epilepsy, unspecified, not intractable, without status epilepticus: Secondary | ICD-10-CM | POA: Diagnosis not present

## 2022-07-21 DIAGNOSIS — I69398 Other sequelae of cerebral infarction: Secondary | ICD-10-CM | POA: Diagnosis not present

## 2022-07-22 DIAGNOSIS — I69318 Other symptoms and signs involving cognitive functions following cerebral infarction: Secondary | ICD-10-CM | POA: Diagnosis not present

## 2022-07-22 DIAGNOSIS — J9611 Chronic respiratory failure with hypoxia: Secondary | ICD-10-CM | POA: Diagnosis not present

## 2022-07-22 DIAGNOSIS — B9620 Unspecified Escherichia coli [E. coli] as the cause of diseases classified elsewhere: Secondary | ICD-10-CM | POA: Diagnosis not present

## 2022-07-22 DIAGNOSIS — I69398 Other sequelae of cerebral infarction: Secondary | ICD-10-CM | POA: Diagnosis not present

## 2022-07-22 DIAGNOSIS — G40909 Epilepsy, unspecified, not intractable, without status epilepticus: Secondary | ICD-10-CM | POA: Diagnosis not present

## 2022-07-22 DIAGNOSIS — G928 Other toxic encephalopathy: Secondary | ICD-10-CM | POA: Diagnosis not present

## 2022-07-23 DIAGNOSIS — J9611 Chronic respiratory failure with hypoxia: Secondary | ICD-10-CM | POA: Diagnosis not present

## 2022-07-23 DIAGNOSIS — I69318 Other symptoms and signs involving cognitive functions following cerebral infarction: Secondary | ICD-10-CM | POA: Diagnosis not present

## 2022-07-23 DIAGNOSIS — G40909 Epilepsy, unspecified, not intractable, without status epilepticus: Secondary | ICD-10-CM | POA: Diagnosis not present

## 2022-07-23 DIAGNOSIS — I69398 Other sequelae of cerebral infarction: Secondary | ICD-10-CM | POA: Diagnosis not present

## 2022-07-23 DIAGNOSIS — G928 Other toxic encephalopathy: Secondary | ICD-10-CM | POA: Diagnosis not present

## 2022-07-23 DIAGNOSIS — B9620 Unspecified Escherichia coli [E. coli] as the cause of diseases classified elsewhere: Secondary | ICD-10-CM | POA: Diagnosis not present

## 2022-07-24 DIAGNOSIS — I69398 Other sequelae of cerebral infarction: Secondary | ICD-10-CM | POA: Diagnosis not present

## 2022-07-24 DIAGNOSIS — G928 Other toxic encephalopathy: Secondary | ICD-10-CM | POA: Diagnosis not present

## 2022-07-24 DIAGNOSIS — I69318 Other symptoms and signs involving cognitive functions following cerebral infarction: Secondary | ICD-10-CM | POA: Diagnosis not present

## 2022-07-24 DIAGNOSIS — J9611 Chronic respiratory failure with hypoxia: Secondary | ICD-10-CM | POA: Diagnosis not present

## 2022-07-24 DIAGNOSIS — G40909 Epilepsy, unspecified, not intractable, without status epilepticus: Secondary | ICD-10-CM | POA: Diagnosis not present

## 2022-07-24 DIAGNOSIS — B9620 Unspecified Escherichia coli [E. coli] as the cause of diseases classified elsewhere: Secondary | ICD-10-CM | POA: Diagnosis not present

## 2022-07-25 DIAGNOSIS — I69398 Other sequelae of cerebral infarction: Secondary | ICD-10-CM | POA: Diagnosis not present

## 2022-07-25 DIAGNOSIS — B9620 Unspecified Escherichia coli [E. coli] as the cause of diseases classified elsewhere: Secondary | ICD-10-CM | POA: Diagnosis not present

## 2022-07-25 DIAGNOSIS — J9611 Chronic respiratory failure with hypoxia: Secondary | ICD-10-CM | POA: Diagnosis not present

## 2022-07-25 DIAGNOSIS — I679 Cerebrovascular disease, unspecified: Secondary | ICD-10-CM | POA: Diagnosis not present

## 2022-07-25 DIAGNOSIS — G40909 Epilepsy, unspecified, not intractable, without status epilepticus: Secondary | ICD-10-CM | POA: Diagnosis not present

## 2022-07-25 DIAGNOSIS — I69318 Other symptoms and signs involving cognitive functions following cerebral infarction: Secondary | ICD-10-CM | POA: Diagnosis not present

## 2022-07-25 DIAGNOSIS — Z8673 Personal history of transient ischemic attack (TIA), and cerebral infarction without residual deficits: Secondary | ICD-10-CM | POA: Diagnosis not present

## 2022-07-25 DIAGNOSIS — G928 Other toxic encephalopathy: Secondary | ICD-10-CM | POA: Diagnosis not present

## 2022-07-25 DIAGNOSIS — Z87440 Personal history of urinary (tract) infections: Secondary | ICD-10-CM | POA: Diagnosis not present

## 2022-07-31 ENCOUNTER — Other Ambulatory Visit: Payer: Self-pay | Admitting: *Deleted

## 2022-07-31 NOTE — Patient Outreach (Signed)
Per [facility] Ms. Baksh resides in [facility]. Screening for potential [facility] care coordination services as benefit of insurance plan and PCP.  Update received from [place], [place] social worker. Ms. Darley is long term care resident.   No identifiable [facility] care coordination needs.    Marthenia Rolling, MSN, RN,BSN [place] Acute Care Coordinator [phone] (Direct dial)

## 2022-08-01 DIAGNOSIS — I69318 Other symptoms and signs involving cognitive functions following cerebral infarction: Secondary | ICD-10-CM | POA: Diagnosis not present

## 2022-08-01 DIAGNOSIS — I69398 Other sequelae of cerebral infarction: Secondary | ICD-10-CM | POA: Diagnosis not present

## 2022-08-01 DIAGNOSIS — B9620 Unspecified Escherichia coli [E. coli] as the cause of diseases classified elsewhere: Secondary | ICD-10-CM | POA: Diagnosis not present

## 2022-08-01 DIAGNOSIS — G928 Other toxic encephalopathy: Secondary | ICD-10-CM | POA: Diagnosis not present

## 2022-08-01 DIAGNOSIS — J9611 Chronic respiratory failure with hypoxia: Secondary | ICD-10-CM | POA: Diagnosis not present

## 2022-08-01 DIAGNOSIS — G40909 Epilepsy, unspecified, not intractable, without status epilepticus: Secondary | ICD-10-CM | POA: Diagnosis not present

## 2022-08-08 DIAGNOSIS — L89150 Pressure ulcer of sacral region, unstageable: Secondary | ICD-10-CM | POA: Diagnosis not present

## 2022-08-09 DIAGNOSIS — G928 Other toxic encephalopathy: Secondary | ICD-10-CM | POA: Diagnosis not present

## 2022-08-09 DIAGNOSIS — I69398 Other sequelae of cerebral infarction: Secondary | ICD-10-CM | POA: Diagnosis not present

## 2022-08-09 DIAGNOSIS — B9620 Unspecified Escherichia coli [E. coli] as the cause of diseases classified elsewhere: Secondary | ICD-10-CM | POA: Diagnosis not present

## 2022-08-09 DIAGNOSIS — I69318 Other symptoms and signs involving cognitive functions following cerebral infarction: Secondary | ICD-10-CM | POA: Diagnosis not present

## 2022-08-09 DIAGNOSIS — G40909 Epilepsy, unspecified, not intractable, without status epilepticus: Secondary | ICD-10-CM | POA: Diagnosis not present

## 2022-08-09 DIAGNOSIS — J9611 Chronic respiratory failure with hypoxia: Secondary | ICD-10-CM | POA: Diagnosis not present

## 2022-08-10 DIAGNOSIS — D649 Anemia, unspecified: Secondary | ICD-10-CM | POA: Diagnosis not present

## 2022-08-10 DIAGNOSIS — J9611 Chronic respiratory failure with hypoxia: Secondary | ICD-10-CM | POA: Diagnosis not present

## 2022-08-10 DIAGNOSIS — I69398 Other sequelae of cerebral infarction: Secondary | ICD-10-CM | POA: Diagnosis not present

## 2022-08-10 DIAGNOSIS — I422 Other hypertrophic cardiomyopathy: Secondary | ICD-10-CM | POA: Diagnosis not present

## 2022-08-10 DIAGNOSIS — E119 Type 2 diabetes mellitus without complications: Secondary | ICD-10-CM | POA: Diagnosis not present

## 2022-08-10 DIAGNOSIS — B9620 Unspecified Escherichia coli [E. coli] as the cause of diseases classified elsewhere: Secondary | ICD-10-CM | POA: Diagnosis not present

## 2022-08-10 DIAGNOSIS — Z8616 Personal history of COVID-19: Secondary | ICD-10-CM | POA: Diagnosis not present

## 2022-08-10 DIAGNOSIS — G928 Other toxic encephalopathy: Secondary | ICD-10-CM | POA: Diagnosis not present

## 2022-08-10 DIAGNOSIS — R159 Full incontinence of feces: Secondary | ICD-10-CM | POA: Diagnosis not present

## 2022-08-10 DIAGNOSIS — Z741 Need for assistance with personal care: Secondary | ICD-10-CM | POA: Diagnosis not present

## 2022-08-10 DIAGNOSIS — Z853 Personal history of malignant neoplasm of breast: Secondary | ICD-10-CM | POA: Diagnosis not present

## 2022-08-10 DIAGNOSIS — I10 Essential (primary) hypertension: Secondary | ICD-10-CM | POA: Diagnosis not present

## 2022-08-10 DIAGNOSIS — I69318 Other symptoms and signs involving cognitive functions following cerebral infarction: Secondary | ICD-10-CM | POA: Diagnosis not present

## 2022-08-10 DIAGNOSIS — M5000 Cervical disc disorder with myelopathy, unspecified cervical region: Secondary | ICD-10-CM | POA: Diagnosis not present

## 2022-08-10 DIAGNOSIS — Z7401 Bed confinement status: Secondary | ICD-10-CM | POA: Diagnosis not present

## 2022-08-10 DIAGNOSIS — F0153 Vascular dementia, unspecified severity, with mood disturbance: Secondary | ICD-10-CM | POA: Diagnosis not present

## 2022-08-10 DIAGNOSIS — D696 Thrombocytopenia, unspecified: Secondary | ICD-10-CM | POA: Diagnosis not present

## 2022-08-10 DIAGNOSIS — G40909 Epilepsy, unspecified, not intractable, without status epilepticus: Secondary | ICD-10-CM | POA: Diagnosis not present

## 2022-08-10 DIAGNOSIS — Z87440 Personal history of urinary (tract) infections: Secondary | ICD-10-CM | POA: Diagnosis not present

## 2022-08-10 DIAGNOSIS — H1132 Conjunctival hemorrhage, left eye: Secondary | ICD-10-CM | POA: Diagnosis not present

## 2022-08-10 DIAGNOSIS — R32 Unspecified urinary incontinence: Secondary | ICD-10-CM | POA: Diagnosis not present

## 2022-08-10 DIAGNOSIS — D709 Neutropenia, unspecified: Secondary | ICD-10-CM | POA: Diagnosis not present

## 2022-08-10 DIAGNOSIS — E43 Unspecified severe protein-calorie malnutrition: Secondary | ICD-10-CM | POA: Diagnosis not present

## 2022-08-10 DIAGNOSIS — Z9981 Dependence on supplemental oxygen: Secondary | ICD-10-CM | POA: Diagnosis not present

## 2022-08-10 DIAGNOSIS — E785 Hyperlipidemia, unspecified: Secondary | ICD-10-CM | POA: Diagnosis not present

## 2022-08-15 DIAGNOSIS — L89150 Pressure ulcer of sacral region, unstageable: Secondary | ICD-10-CM | POA: Diagnosis not present

## 2022-08-17 DIAGNOSIS — Z79899 Other long term (current) drug therapy: Secondary | ICD-10-CM | POA: Diagnosis not present

## 2022-08-17 DIAGNOSIS — B9620 Unspecified Escherichia coli [E. coli] as the cause of diseases classified elsewhere: Secondary | ICD-10-CM | POA: Diagnosis not present

## 2022-08-17 DIAGNOSIS — J9611 Chronic respiratory failure with hypoxia: Secondary | ICD-10-CM | POA: Diagnosis not present

## 2022-08-17 DIAGNOSIS — I422 Other hypertrophic cardiomyopathy: Secondary | ICD-10-CM | POA: Diagnosis not present

## 2022-08-17 DIAGNOSIS — I69318 Other symptoms and signs involving cognitive functions following cerebral infarction: Secondary | ICD-10-CM | POA: Diagnosis not present

## 2022-08-17 DIAGNOSIS — G928 Other toxic encephalopathy: Secondary | ICD-10-CM | POA: Diagnosis not present

## 2022-08-17 DIAGNOSIS — F0153 Vascular dementia, unspecified severity, with mood disturbance: Secondary | ICD-10-CM | POA: Diagnosis not present

## 2022-08-21 DIAGNOSIS — I69318 Other symptoms and signs involving cognitive functions following cerebral infarction: Secondary | ICD-10-CM | POA: Diagnosis not present

## 2022-08-21 DIAGNOSIS — F0153 Vascular dementia, unspecified severity, with mood disturbance: Secondary | ICD-10-CM | POA: Diagnosis not present

## 2022-08-21 DIAGNOSIS — G928 Other toxic encephalopathy: Secondary | ICD-10-CM | POA: Diagnosis not present

## 2022-08-21 DIAGNOSIS — J9611 Chronic respiratory failure with hypoxia: Secondary | ICD-10-CM | POA: Diagnosis not present

## 2022-08-21 DIAGNOSIS — I422 Other hypertrophic cardiomyopathy: Secondary | ICD-10-CM | POA: Diagnosis not present

## 2022-08-21 DIAGNOSIS — B9620 Unspecified Escherichia coli [E. coli] as the cause of diseases classified elsewhere: Secondary | ICD-10-CM | POA: Diagnosis not present

## 2022-08-22 DIAGNOSIS — L89150 Pressure ulcer of sacral region, unstageable: Secondary | ICD-10-CM | POA: Diagnosis not present

## 2022-08-24 DIAGNOSIS — B9620 Unspecified Escherichia coli [E. coli] as the cause of diseases classified elsewhere: Secondary | ICD-10-CM | POA: Diagnosis not present

## 2022-08-24 DIAGNOSIS — F0153 Vascular dementia, unspecified severity, with mood disturbance: Secondary | ICD-10-CM | POA: Diagnosis not present

## 2022-08-24 DIAGNOSIS — J9611 Chronic respiratory failure with hypoxia: Secondary | ICD-10-CM | POA: Diagnosis not present

## 2022-08-24 DIAGNOSIS — G928 Other toxic encephalopathy: Secondary | ICD-10-CM | POA: Diagnosis not present

## 2022-08-24 DIAGNOSIS — I422 Other hypertrophic cardiomyopathy: Secondary | ICD-10-CM | POA: Diagnosis not present

## 2022-08-24 DIAGNOSIS — I69318 Other symptoms and signs involving cognitive functions following cerebral infarction: Secondary | ICD-10-CM | POA: Diagnosis not present

## 2022-08-25 DIAGNOSIS — J9611 Chronic respiratory failure with hypoxia: Secondary | ICD-10-CM | POA: Diagnosis not present

## 2022-08-25 DIAGNOSIS — F0153 Vascular dementia, unspecified severity, with mood disturbance: Secondary | ICD-10-CM | POA: Diagnosis not present

## 2022-08-25 DIAGNOSIS — I69318 Other symptoms and signs involving cognitive functions following cerebral infarction: Secondary | ICD-10-CM | POA: Diagnosis not present

## 2022-08-25 DIAGNOSIS — B9620 Unspecified Escherichia coli [E. coli] as the cause of diseases classified elsewhere: Secondary | ICD-10-CM | POA: Diagnosis not present

## 2022-08-25 DIAGNOSIS — G928 Other toxic encephalopathy: Secondary | ICD-10-CM | POA: Diagnosis not present

## 2022-08-25 DIAGNOSIS — I422 Other hypertrophic cardiomyopathy: Secondary | ICD-10-CM | POA: Diagnosis not present

## 2022-08-26 DIAGNOSIS — G928 Other toxic encephalopathy: Secondary | ICD-10-CM | POA: Diagnosis not present

## 2022-08-26 DIAGNOSIS — B9620 Unspecified Escherichia coli [E. coli] as the cause of diseases classified elsewhere: Secondary | ICD-10-CM | POA: Diagnosis not present

## 2022-08-26 DIAGNOSIS — F0153 Vascular dementia, unspecified severity, with mood disturbance: Secondary | ICD-10-CM | POA: Diagnosis not present

## 2022-08-26 DIAGNOSIS — J9611 Chronic respiratory failure with hypoxia: Secondary | ICD-10-CM | POA: Diagnosis not present

## 2022-08-26 DIAGNOSIS — I69318 Other symptoms and signs involving cognitive functions following cerebral infarction: Secondary | ICD-10-CM | POA: Diagnosis not present

## 2022-08-26 DIAGNOSIS — I422 Other hypertrophic cardiomyopathy: Secondary | ICD-10-CM | POA: Diagnosis not present

## 2022-08-27 DIAGNOSIS — I422 Other hypertrophic cardiomyopathy: Secondary | ICD-10-CM | POA: Diagnosis not present

## 2022-08-27 DIAGNOSIS — I69318 Other symptoms and signs involving cognitive functions following cerebral infarction: Secondary | ICD-10-CM | POA: Diagnosis not present

## 2022-08-27 DIAGNOSIS — G928 Other toxic encephalopathy: Secondary | ICD-10-CM | POA: Diagnosis not present

## 2022-08-27 DIAGNOSIS — B9620 Unspecified Escherichia coli [E. coli] as the cause of diseases classified elsewhere: Secondary | ICD-10-CM | POA: Diagnosis not present

## 2022-08-27 DIAGNOSIS — J9611 Chronic respiratory failure with hypoxia: Secondary | ICD-10-CM | POA: Diagnosis not present

## 2022-08-27 DIAGNOSIS — F0153 Vascular dementia, unspecified severity, with mood disturbance: Secondary | ICD-10-CM | POA: Diagnosis not present

## 2022-08-28 DIAGNOSIS — J9611 Chronic respiratory failure with hypoxia: Secondary | ICD-10-CM | POA: Diagnosis not present

## 2022-08-28 DIAGNOSIS — I679 Cerebrovascular disease, unspecified: Secondary | ICD-10-CM | POA: Diagnosis not present

## 2022-08-28 DIAGNOSIS — I69318 Other symptoms and signs involving cognitive functions following cerebral infarction: Secondary | ICD-10-CM | POA: Diagnosis not present

## 2022-08-28 DIAGNOSIS — D61818 Other pancytopenia: Secondary | ICD-10-CM | POA: Diagnosis not present

## 2022-08-28 DIAGNOSIS — F0153 Vascular dementia, unspecified severity, with mood disturbance: Secondary | ICD-10-CM | POA: Diagnosis not present

## 2022-08-28 DIAGNOSIS — I422 Other hypertrophic cardiomyopathy: Secondary | ICD-10-CM | POA: Diagnosis not present

## 2022-08-28 DIAGNOSIS — B9620 Unspecified Escherichia coli [E. coli] as the cause of diseases classified elsewhere: Secondary | ICD-10-CM | POA: Diagnosis not present

## 2022-08-28 DIAGNOSIS — G928 Other toxic encephalopathy: Secondary | ICD-10-CM | POA: Diagnosis not present

## 2022-09-10 DEATH — deceased
